# Patient Record
Sex: Male | Born: 1935 | Race: White | Hispanic: No | Marital: Married
Health system: Southern US, Community
[De-identification: ages and names within clinical notes are randomized; demographics above are authoritative.]

## PROBLEM LIST (undated history)

## (undated) DIAGNOSIS — I10 Essential (primary) hypertension: Secondary | ICD-10-CM

## (undated) DIAGNOSIS — I447 Left bundle-branch block, unspecified: Secondary | ICD-10-CM

## (undated) DIAGNOSIS — N189 Chronic kidney disease, unspecified: Secondary | ICD-10-CM

## (undated) DIAGNOSIS — I739 Peripheral vascular disease, unspecified: Secondary | ICD-10-CM

## (undated) DIAGNOSIS — I272 Pulmonary hypertension, unspecified: Secondary | ICD-10-CM

## (undated) DIAGNOSIS — Z952 Presence of prosthetic heart valve: Secondary | ICD-10-CM

## (undated) DIAGNOSIS — I2510 Atherosclerotic heart disease of native coronary artery without angina pectoris: Secondary | ICD-10-CM

## (undated) DIAGNOSIS — Z8673 Personal history of transient ischemic attack (TIA), and cerebral infarction without residual deficits: Secondary | ICD-10-CM

## (undated) DIAGNOSIS — N182 Chronic kidney disease, stage 2 (mild): Secondary | ICD-10-CM

## (undated) HISTORY — PX: CORONARY ANGIOPLASTY: SHX604

## (undated) HISTORY — DX: Peripheral vascular disease, unspecified: I73.9

---

## 1998-10-16 ENCOUNTER — Encounter: Payer: Self-pay | Admitting: Cardiology

## 1998-10-16 ENCOUNTER — Ambulatory Visit (HOSPITAL_COMMUNITY): Admission: RE | Admit: 1998-10-16 | Discharge: 1998-10-16 | Payer: Self-pay | Admitting: Cardiology

## 1999-03-21 ENCOUNTER — Ambulatory Visit (HOSPITAL_COMMUNITY): Admission: RE | Admit: 1999-03-21 | Discharge: 1999-03-21 | Payer: Self-pay | Admitting: *Deleted

## 2000-06-23 ENCOUNTER — Encounter: Admission: RE | Admit: 2000-06-23 | Discharge: 2000-06-23 | Payer: Self-pay | Admitting: Endocrinology

## 2000-06-23 ENCOUNTER — Encounter: Payer: Self-pay | Admitting: Endocrinology

## 2004-05-02 ENCOUNTER — Ambulatory Visit (HOSPITAL_COMMUNITY): Admission: RE | Admit: 2004-05-02 | Discharge: 2004-05-02 | Payer: Self-pay | Admitting: *Deleted

## 2004-05-02 ENCOUNTER — Encounter (INDEPENDENT_AMBULATORY_CARE_PROVIDER_SITE_OTHER): Payer: Self-pay | Admitting: *Deleted

## 2005-10-10 ENCOUNTER — Ambulatory Visit (HOSPITAL_COMMUNITY): Admission: RE | Admit: 2005-10-10 | Discharge: 2005-10-10 | Payer: Self-pay | Admitting: *Deleted

## 2005-10-10 ENCOUNTER — Encounter (INDEPENDENT_AMBULATORY_CARE_PROVIDER_SITE_OTHER): Payer: Self-pay | Admitting: Specialist

## 2006-08-18 HISTORY — PX: OTHER SURGICAL HISTORY: SHX169

## 2007-02-22 ENCOUNTER — Encounter: Admission: RE | Admit: 2007-02-22 | Discharge: 2007-02-22 | Payer: Self-pay | Admitting: Cardiology

## 2007-04-21 ENCOUNTER — Encounter (INDEPENDENT_AMBULATORY_CARE_PROVIDER_SITE_OTHER): Payer: Self-pay | Admitting: Neurology

## 2007-04-21 ENCOUNTER — Inpatient Hospital Stay (HOSPITAL_COMMUNITY): Admission: EM | Admit: 2007-04-21 | Discharge: 2007-04-23 | Payer: Self-pay | Admitting: *Deleted

## 2007-04-22 ENCOUNTER — Encounter (INDEPENDENT_AMBULATORY_CARE_PROVIDER_SITE_OTHER): Payer: Self-pay | Admitting: Neurology

## 2007-04-22 ENCOUNTER — Ambulatory Visit: Payer: Self-pay | Admitting: Surgery

## 2007-04-22 DIAGNOSIS — I639 Cerebral infarction, unspecified: Secondary | ICD-10-CM | POA: Insufficient documentation

## 2007-04-24 ENCOUNTER — Inpatient Hospital Stay (HOSPITAL_COMMUNITY): Admission: EM | Admit: 2007-04-24 | Discharge: 2007-04-27 | Payer: Self-pay | Admitting: Emergency Medicine

## 2007-04-26 ENCOUNTER — Encounter: Payer: Self-pay | Admitting: Surgery

## 2007-05-17 ENCOUNTER — Ambulatory Visit: Payer: Self-pay | Admitting: Surgery

## 2007-06-07 ENCOUNTER — Ambulatory Visit: Payer: Self-pay | Admitting: Surgery

## 2007-08-04 ENCOUNTER — Encounter: Payer: Self-pay | Admitting: Surgery

## 2007-08-04 ENCOUNTER — Ambulatory Visit: Payer: Self-pay | Admitting: Surgery

## 2007-08-04 ENCOUNTER — Inpatient Hospital Stay (HOSPITAL_COMMUNITY): Admission: RE | Admit: 2007-08-04 | Discharge: 2007-08-05 | Payer: Self-pay | Admitting: Surgery

## 2007-08-04 HISTORY — PX: CORONARY ARTERY BYPASS GRAFT: SHX141

## 2007-08-23 ENCOUNTER — Ambulatory Visit: Payer: Self-pay | Admitting: Surgery

## 2008-01-03 ENCOUNTER — Encounter (INDEPENDENT_AMBULATORY_CARE_PROVIDER_SITE_OTHER): Payer: Self-pay | Admitting: *Deleted

## 2008-01-03 ENCOUNTER — Ambulatory Visit (HOSPITAL_COMMUNITY): Admission: RE | Admit: 2008-01-03 | Discharge: 2008-01-03 | Payer: Self-pay | Admitting: *Deleted

## 2008-02-07 ENCOUNTER — Ambulatory Visit: Payer: Self-pay | Admitting: Surgery

## 2008-07-24 ENCOUNTER — Ambulatory Visit: Payer: Self-pay | Admitting: Surgery

## 2008-08-03 ENCOUNTER — Emergency Department (HOSPITAL_COMMUNITY): Admission: EM | Admit: 2008-08-03 | Discharge: 2008-08-03 | Payer: Self-pay | Admitting: Emergency Medicine

## 2009-07-30 ENCOUNTER — Ambulatory Visit: Payer: Self-pay | Admitting: Surgery

## 2010-09-09 ENCOUNTER — Ambulatory Visit
Admission: RE | Admit: 2010-09-09 | Discharge: 2010-09-09 | Payer: Self-pay | Source: Home / Self Care | Attending: Surgery | Admitting: Surgery

## 2010-09-16 NOTE — Procedures (Unsigned)
CAROTID DUPLEX EXAM  INDICATION:  Bilateral carotid endarterectomy followup.  HISTORY: Diabetes:  No. Cardiac:  CABG. Hypertension:  Yes. Smoking:  No. Previous Surgery:  Left carotid endarterectomy, 04/26/2007, right carotid endarterectomy, 07/25/2007. CV History:  Currently asymptomatic. Amaurosis Fugax No, Paresthesias No, Hemiparesis No.                                      RIGHT             LEFT Brachial systolic pressure:         142               145 Brachial Doppler waveforms:         Normal            Normal Vertebral direction of flow:        Antegrade         Antegrade DUPLEX VELOCITIES (cm/sec) CCA peak systolic                   63                82 ECA peak systolic                   93                98 ICA peak systolic                   74                79 ICA end diastolic                   27                25 PLAQUE MORPHOLOGY:                  Intimal wall thickening             Intimal wall thickening PLAQUE AMOUNT:                      Minimal           Minimal PLAQUE LOCATION:                    CCA               CCA  IMPRESSION: 1. Patent bilateral carotid endarterectomy sites with no evidence of     restenosis of bilateral internal carotid arteries. 2. No significant change from previous study.  ___________________________________________ V. Leia Alf, MD  EM/MEDQ  D:  09/09/2010  T:  09/09/2010  Job:  [id number]

## 2010-12-31 NOTE — Op Note (Signed)
NAMEAVRON, [name]                  ACCOUNT NO.:  [id number]   MEDICAL RECORD NO.:  [id number]          PATIENT TYPE:  INP   LOCATION:  2550                         FACILITY:  [facility]   PHYSICIAN:  Theotis Burrow IV, MDDATE OF BIRTH:  18-Jan-1934   DATE OF PROCEDURE:  08/04/2007  DATE OF DISCHARGE:                               OPERATIVE REPORT   ASSISTANT:  Amanda L. Dahlia Client, P.A.   PREOPERATIVE DIAGNOSIS:  Asymptomatic right carotid stenosis.   POSTOPERATIVE DIAGNOSIS:  Asymptomatic right carotid stenosis.   PROCEDURE PERFORMED:  Right carotid endarterectomy.   TYPE OF ANESTHESIA:  General.   DRAINS:  15-Blake.   FINDINGS:  90% stenosis.   SPECIMENS:  Plaque.   BLOOD LOSS:  200 mL.   COMPLICATIONS:  None.   INDICATIONS:  This is a 77 year old gentleman who underwent left carotid  endarterectomy in September after suffering a mild stroke.  At the time  of that workup, he has found to have significant stenosis on the right  side.  Decision has been made to proceed with right carotid  endarterectomy.  His vocal cords were evaluated preoperatively and found  to be functioning appropriately.  Risks and benefits were discussed.  Informed consent was signed.   DESCRIPTION OF PROCEDURE:  The patient was identified in the holding  area and taken to room #6.  He was placed supine on the table.  General  endotracheal anesthesia was administered.  The patient was prepped and  draped in a standard sterile fashion.  A timeout was called and  perioperative antibiotics were administered.  A longitudinal incision  was made along the anterior surface of the sternocleidomastoid.  Bovie  cautery was used to dissect through the subcutaneous tissue.  The  platysma muscle was divided with Bovie cautery.  Weitlaner retractors  were used for exposure.  Bovie cautery was used to dissect down to the  carotid sheath.  At this point, the facial vein was identified and  ligated between 2-0 silk  ties.  There were numerous venous branches off  of the internal jugular vein, which were divided between silk ties.  Next, the carotid sheath was opened sharply and the common carotid  artery was encircled with a vessel loop.  Next, the closure of the  external and superior thyroid arteries were performed.  The external  carotid was encircled with a blue vessel loop and the superior thyroid  artery was encircled with a 2-0 silk tie.  Next, the internal carotid  artery was isolated.  The hypoglossal nerve was visualized and  preserved.  Once there was adequate distance exposed on the internal  carotid artery, the patient was given systemic heparinization.  Next,  the internal carotid artery was occluded followed by the common and  external carotid arteries.  An arteriotomy was made with #11 blade and  the artery was opened with Potts scissors.  A 10-French Silastic shunt  was then placed.  Next, endarterectomy was performed using a Investment banker, corporate.  Heparinized saline was used to irrigate the endarterectomy  plane and all potential embolic debris was  removed.  Patch angioplasty  was performed using a bovine pericardial patch.  This was sutured into  position using a running 6-0 Prolene.  Prior to completion of the patch  angioplasty, the shunt was removed.  The internal, external, and common  carotid arteries were flushed appropriately.  The artery was then  irrigated with heparinized saline and remaining sutures were placed.  The anastomosis was then secured and clamps were released; first the  external followed by the common, and approximately 15 seconds later, the  clamp on the internal carotid artery was released.  Two pair of stitches  were required for hemostasis.  The wound was then copiously irrigated.  Surgicel was used to aid with hemostasis.  Once hemostasis was  excellent, a 15-Blake drain was placed.  The fascia of the  sternocleidomastoid was reapproximated with interrupted  3-0 Vicryl.  Next, the platysma muscle was closed with a running 2-0 Vicryl and the  skin was closed with a running 4-0 Vicryl.  Dermabond was then placed on  the skin.  The sterile dressings were applied.  The drain was secured  with a 3-0 nylon.  The patient was then successfully extubated.  He was  found to be following commands and able to move all four extremities to  command.  He was then taken to the recovery room in stable condition.           ______________________________  V. Leia Alf, MD  Electronically Signed     VWB/MEDQ  D:  08/04/2007  T:  08/04/2007  Job:  [id number]

## 2010-12-31 NOTE — Assessment & Plan Note (Signed)
OFFICE VISIT   CARTEZ, TRITZ D  DOB:  10-12-1933                                       06/07/2007  CHART#:[id number]   REASON FOR VISIT:  1. Follow up left carotid endarterectomy.  2. Evaluate right carotid stenosis.   HISTORY:  This is a 77 year old gentleman who initially presented with  right arm and leg weakness.  He was diagnosed with having a mild acute  stroke.  He underwent a left carotid endarterectomy, performed on  April 26, 2007.  His postoperative course was complicated by mild  facial droop.  He comes back in today for evaluation of the right side.  From his original operation, he is recovering well.  He still has a mild  facial droop and some numbness from around his incision, otherwise he is  doing very well.  He is continuing his aspirin.  With regards to the  right side, he remains asymptomatic.  Specifically, he denies amaurosis,  numbness, or weakness.   REVIEW OF SYSTEMS:  NEURO:  See above.  CARDIAC:  No chest pain.  PULMONARY:  No shortness of breath.  NEUROLOGIC:  Mild left facial droop.  No new right-sided symptoms.   Past medical history, surgical history, and medications are unchanged  from previous visit.   PHYSICAL EXAMINATION:  Pulse is 52.  Left arm blood pressure is 169/88.  Right arm is 168/97.  O2 sats are 100%.  General:  Well-appearing in no  acute distress.  Cardiovascular:  Regular.  Respirations nonlabored.  Neck:  Left carotid endarterectomy incision is well healed.  Neuro:  No  motor or sensory deficits bilaterally.   DIAGNOSTIC STUDIES:  Patient underwent duplex evaluation today, which  reveals resolution of stenosis within the left side.  He has peaked  systolic velocities on the left at 0000000 and diastolic velocities of 87.   ASSESSMENT:  Bilateral carotid stenosis, status post left carotid  endarterectomy .   PLAN:  Even at this time, even his velocity profile puts him in the 60-  79% stenosis.  He is  right on the border.  Given that he has previously  had a stroke and incidentally, he presents stenosis on the left side,  the symptomatic side was 60-79%, I have recommended proceeding with  right carotid endarterectomy.  We had a lengthy discussion regarding  risks and benefits.  At this time, the patient wishes to proceed with an  operation; however, he will call to have this scheduled.  On the  meantime, he will continue on his aspirin.  Patient needs to be  evaluated by ENT to evaluate his cords prior to surgery.  This will be  scheduled.   Eldridge Abrahams, MD  Electronically Signed   VWB/MEDQ  D:  06/07/2007  T:  06/08/2007  Job:  [id number]

## 2010-12-31 NOTE — H&P (Signed)
[name], [name]:  [id number]   MEDICAL RECORD NO.:  [id number]          PATIENT TYPE:  INP   LOCATION:  3037                         FACILITY:  [facility]   PHYSICIAN:  Flint Melter. [name], M.D.DATE OF BIRTH:  1934/02/22   DATE OF ADMISSION:  04/21/2007  DATE OF DISCHARGE:                              HISTORY & PHYSICAL   CHIEF COMPLAINT:  Right arm weakness, sudden onset.   HISTORY OF PRESENT ILLNESS:  [name] is a 77 year old right-handed  white man with no significant past medical history except for  hypertension and coronary artery disease.  He has generally been  healthy.  He was shaving today and noticed a sudden onset of right arm  weakness, the arm just dropped to his side.  He and his wife came in  quickly and he was seen within an hour or less of the time of the onset  of his symptoms.  He began to have some improvement of the weakness  after arrival here.  He denied any dizziness or incoordination, or  nausea, vomiting.  No headache.   REVIEW OF SYSTEMS:  A 12-system review is negative except for some  flatulence related to his medication and some mild chronic leg weakness  secondary to his cholesterol medications.  The remaining review of  systems is negative.   PAST MEDICAL HISTORY:  Significant for:  1. Coronary artery disease without a MI.  Status post stent which      failed.  Then status post CABG.  2. He has hypertension.  3. Hyperlipidemia.   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   MEDICATIONS:  1. He takes Benicar 20 mg once a day.  2. Crestor 10 mg once a day.  3. Zetia 10 mg once a day.  4. Niaspan 500 mg once a day.  5. Metoprolol 25 mg twice a day.  6. Omega fish oil 1000 mg three times a day.  7. Aspirin 325 mg half tablet a day.   ALLERGIES:  NO KNOWN DRUG ALLERGIES.   SOCIAL HISTORY:  He is married.  He has 2 children.  He  is a retired  Administrator, sports.  He does not smoke, drink, or use illicit drugs.   FAMILY HISTORY:  Positive for stroke in an aunt.  Also hypertension.   PHYSICAL EXAMINATION:  VITAL SIGNS:  Temperature is not taken.  Blood  pressure is 161/79, heart rate 67, respirations 22, 100% sat.  HEAD:  Normocephalic, atraumatic.  NECK:  Supple without bruits.  HEART:  Regular rate and rhythm.  LUNGS:  Clear to auscultation.  ABDOMEN:  Nontender, soft.  SKIN:  Dry.  NEUROLOGIC EXAM:  He is alert and oriented with normal language and  cognition.  Cranial nerves, pupils are equal and reactive.  Visual  fields are full.  Extraocular movements are intact.  Facial sensation is  normal.  Facial motor activity is normal.  Hearing is intact.  Palette  is symmetric.  Tongue is  midline.  MOTOR EXAM:  He has got no drift in the upper extremities or lower  extremities.  Normal strength.  Deep tendon reflexes are 1 to 2+ with  downgoing toes.  COORDINATION:  He has a distinct dysmetria on finger-to-nose on the  right.  It is normal on the left.  Heel-to-shin is normal bilaterally.  Sensory is normal.  He scores 1 on the NIH stroke scale for the  dysmetria on the finger-to-nose.  Modified Rankin is 0.   TEST RESULTS:  BMET is pending.  CBC is essentially normal.  CT of the  head shows some subacute to chronic anterior right frontal  hypodensities.  PT is 13.6, INR 1, PTT 30.   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   PLAN:  Will admit for stroke workup.  Increase aspirin.  Check MRI of  the brain.  MR angiogram, 2D echo, fasting lipids, and homocystine.  Carotid Doppler and transcranial Doppler.  He is not a TPA candidate  secondary to a score of 1 on the NIH scale with a single element of  finger-to-nose ataxia.      [name] [name], M.D.  Electronically Signed     CAW/MEDQ  D:  04/21/2007  T:  04/22/2007  Job:  [id number]

## 2010-12-31 NOTE — Assessment & Plan Note (Signed)
OFFICE VISIT   [name]  DOB:  10/01/1933                                       08/23/2007  CHART#:[id number]   REASON FOR VISIT:  Status post carotid endarterectomy.   HISTORY:  This is a 77 year old gentleman who initially presented with  right arm and leg weakness and was diagnosed as having mild acute  stroke.  On September 8, he underwent a left carotid endarterectomy.  He  is recovered from that without incident.  He was also found during his  stroke workup to have an 80% stenosis in his right carotid.  For that  reason, he recently underwent a right carotid endarterectomy performed  in early December.  He comes back in today for followup.  The patient  reports that he is not having any difficulties.  He has had no  complications in his postoperative course.  He is doing well.  He has no  neurologic deficits.   PHYSICAL EXAM:  Blood pressure 192/104, pulse 64, respirations 18.  In  general, he is alert and oriented, in no acute distress.  His strength  is symmetric bilaterally.  Tongue is midline.  There is no facial droop.   ASSESSMENT AND PLAN:  Status post bilateral carotid endarterectomy.   PLAN:  The patient is doing well at this time.  I have recommended that  he continue his aspirin.  His blood pressure is slightly elevated today.  He states that he first may have adjusted his Benicar to a lower dose,  and he will increase it now given his increase in blood pressure today.  I have him scheduled to follow up with me in 6 months, at which time we  will repeat his  ultrasound.  He is going to be placed on the ultrasound protocol.  I  will see him back in 6 months.   Steven Abrahams, MD  Electronically Signed   VWB/MEDQ  D:  08/23/2007  T:  08/24/2007  Job:  [id number]   cc:   Ronaldo Miyamoto, M.D.

## 2010-12-31 NOTE — Discharge Summary (Signed)
[name], [name]                  ACCOUNT NO.:  [id number]   MEDICAL RECORD NO.:  [id number]          PATIENT TYPE:  INP   LOCATION:  [id number]                         FACILITY:  [facility]   PHYSICIAN:  Theotis Burrow IV, MDDATE OF BIRTH:  May 23, 1934   DATE OF ADMISSION:  08/04/2007  DATE OF DISCHARGE:  08/05/2007                               DISCHARGE SUMMARY   ADMISSION DIAGNOSIS:  Right internal carotid stenosis.   SECONDARY AND DISCHARGE DIAGNOSES:  1. Right internal carotid stenosis.  2. Gastrointestinal bleed in the 1970s.  3. Hypertension.  4. Coronary artery bypass grafting.  5. Percutaneous transluminal coronary angioplasty and stenting.  6. History of left bundle blockage.  7. History of stroke.  8. Hiatal hernia.   CONSULTATIONS:  None.   PROCEDURES:  1. Right carotid endarterectomy with bovine pericardial patch.  2. Insertion of JP drain.   BRIEF HISTORY:  This is a 77 year old gentleman who initially presented  with right arm and leg weakness.  He was diagnosed with having a mild  acute stroke.  He underwent a left carotid endarterectomy performed on  April 26, 2007.  His postoperative course was complicated by mild  facial droop.  He comes back in today on June 07, 2007 for evaluation  of the right side.  From his original operation, he was recovering well.  He still had a mild facial droop and some numbness around his incision;  otherwise, he was doing very well.  He had been continuing his aspirin.  With regards to the right side, he remained asymptomatic.  Specifically,  he denied any amaurosis, numbness, or weakness.  The patient underwent  duplex evaluation on June 07, 2007 which revealed resolution of  stenosis within the left side.  He has peak systolic velocities on the  left at 0000000, diastolic velocities of 87.  Assessing the situation,  bilateral carotid stenosis status post left carotid endarterectomy.  The  plan was even at this time, his  velocity profile puts him in the 60-79%  stenosis.  Being on the border and given his previous history of stroke,  he presents on the left side with stenosis that is symptomatic, and we  recommended proceeding with right carotid endarterectomy.  Had a lengthy  discussion regarding the risks and benefits.  At this time, the patient  wished to proceed with an operation; however, he wanted to call and  schedule it for August 04, 2007.  In the mean time, upon waiting on  surgery, the patient would continue his aspirin.  The patient also  needed to be evaluated by ENT to evaluate his cords prior to surgery.  This was scheduled.  On August 04, 2007, the patient was admitted to  [facility] to proceed with right carotid endarterectomy by Dr.  Trula Slade.  The patient signed consent and agreed to this procedure with  no complications.   HOSPITAL COURSE:  On August 04, 2007, the patient was admitted to  [facility] for procedure with Dr. Trula Slade of right carotid  endarterectomy with bovine pericardial patch and insertion of JP  drain.  The patient tolerated this procedure well with no complications and was  transferred from the OR to the PACU in stable condition.  The patient  continued to remain overnight in PACU due to the fact that there were no  beds available on 3300.  The patient continued to remain stable and  afebrile throughout the night.  Upon examination on August 05, 2007,  postoperative day #1, the patient continued to remain stable at this  time with no complications.  His blood pressure was 144/53, heart rate  66, respirations 15, oxygen 92-94%, temperature 97.6.  Labs at this time  revealed a white blood count of 6.5, hematocrit 10.9, hemoglobin 31.2,  and platelets at 121.  His sodium was 139, potassium 4.5, chloride 111,  bicarb 24, BUN 17, creatinine 1.15, and glucose 125.  Upon entering the  room, the patient states he had no complaints.  He ate last night   without any difficulty of swallowing and no nausea or vomiting.  The  patient states he slept okay but had mild pain throughout the night.  The patient stated that he was ready to go home today.   PHYSICAL EXAMINATION:  GENERAL:  Alert and oriented x3, lying in bed  waiting for breakfast.  HEART:  Normal rate, regular rhythm.  LUNGS:  Clear bilaterally.  ABDOMEN:  Soft, nontender, with active bowel sounds.  NECK:  His right incision on his neck, he had no hematoma.  There was  minimal drainage in the JP drain, and it seemed like it was proceeding  towards healing and dry and clean.  NEUROLOGIC:  The patient was intact with no deviation of tongue, no  decrease in weakness or strength.   ASSESSMENT AND PLAN:  The patient is status post right carotid  endarterectomy with no complications.  The plan is for the patient to  ambulate this morning if he can tolerate ambulation and eating breakfast  without any nausea and vomiting, then we will discontinue Foley and JP  drain and IV fluids and the patient will be discharged to home.  The  patient agrees with this plan.  Discharge was indicated for August 05, 2007 if the patient continues to remain afebrile and vitals remain  stable.   DISCHARGE MEDICATIONS:  1. __________  10 mg daily.  2. Crestor 10 mg daily.  3. Metoprolol 25 mg x2 daily.  4. Aspirin 81 mg daily.  It was currently at 325, and Dr. Trula Slade      wished to reduce it to 81 mg daily.  5. __________  1000 mg daily.  6. Nexium 40 mg daily.  7. Diovan 20 mg daily.  8. Tylox 1-2 tablets as needed for pain.   DISCHARGE INSTRUCTIONS:  The patient's discharge instructions and  followup care was instructed to patient for discharge on August 05, 2007.  The patient was instructed to clean wound with soap and water.  The patient was also instructed to increase activity slowly and walk  with assistance.  The patient was also discussed that he may shower and  bathe postoperative  48 hours but to do no lifting or driving for the  next 2 weeks.  The patient is also to contact the office or ER if he  notices any redness, swelling, or drainage from site, fever greater than  101, severe onset of a headache, or any neurological status changes.  The patient was also instructed that he needs to follow up with Dr.  Trula Slade  in 2 weeks, and the office will contact him.  The patient was  also instructed to continue home medications with an additional  medication added which was Tylox 1-2 tablets every 6 hours as needed for  pain orally.  The patient was also instructed to decrease his aspirin  from 325 mg to 81 mg daily by mouth.  The patient  agreed to understanding these instructions and agreed to be compliant  with these instructions and followup care.  As long as the patient  continues to tolerate diet this morning, ambulates, remains afebrile and  vitals remain currently stable, then the patient will discharge to home  on August 05, 2007.  The patient agrees to this plan.      Darlin Coco, PA      V. Leia Alf, MD  Electronically Signed    ALW/MEDQ  D:  08/05/2007  T:  08/05/2007  Job:  [id number]

## 2010-12-31 NOTE — H&P (Signed)
NAMECOURTLAND, LOTZ                  ACCOUNT NO.:  [id number]   MEDICAL RECORD NO.:  [id number]          PATIENT TYPE:  INP   LOCATION:  3707                         FACILITY:  [facility]   PHYSICIAN:  Shaune Pascal. Champey, M.D.DATE OF BIRTH:  [birth year]   DATE OF ADMISSION:  04/24/2007  DATE OF DISCHARGE:                              HISTORY & PHYSICAL   REQUESTING PHYSICIAN:  Dr. Dorna Mai   REASON FOR ADMISSION:  Transient ischemic attack.   HISTORY OF PRESENT ILLNESS:  Mr. Kimball is a 77 year old Caucasian male  with multiple medical problems who was recently discharged from the  hospital yesterday after an embolic left hemispheric stroke after a few-  second episode of right-sided numbness/weakness that quickly resolved.  Patient's workup in the hospital last week showed left ICA stenosis of  60 to 80%, right ICA stenosis of greater than 80%.  Surgery was planned  for the near future.  He denies any other symptoms such as headaches,  vision changes, speech changes, swallowing problems, chewing problems,  dizziness, vertigo, balance problems, falls or loss of consciousness.   PAST MEDICAL HISTORY:  Positive for:  1. Hypertension.  2. Heart disease status post CABG.  3. Elevated lipids.  4. Stroke.   CURRENT MEDICATIONS:  Include aspirin, Plavix, nystatin, Zetia,  metoprolol, Crestor, fish oil, Benicar, Nexium.   ALLERGIES:  No known drug allergies.   FAMILY HISTORY:  Positive for stroke and hypertension.   SOCIAL HISTORY:  Patient is a retired , lives with his wife and kids.  Denies any tobacco, alcohol or drug use.   Review of systems is positive as per HPI.  Review of systems is negative  as per HPI in greater than eight other organ systems.   EXAMINATION:  VITALS:  Blood pressure is 128/77, temperature is 99.2,  pulse is 69, respiration is 17, O2 sat is 100%.  HEENT:  Normocephalic, atraumatic.  Extraocular muscles are intact.  Face is symmetric.  NECK:  Supple.  HEART:   Regular.  LUNGS:  Clear.  ABDOMEN:  Soft.  EXTREMITIES:  Showed good pulses.  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  NEUROLOGICAL EXAMINATION:  Patient is awake, alert and oriented.  Language is fluent.  Patient is following commands approximately.  Cranial nerves II-XII grossly intact.  Motor examination shows 5/5  strength and normal tone in all four extremities, no drift is noted.  Sensory examination is within normal limits to light touch.  Reflexes  are 1 to 2+ throughout and symmetric.  Cerebellar function is within  normal limits.  Finger-to-nose and gait are not assessed secondary to  safety.   LABS:  On September 5, WBC is 5.3, hemoglobin 12.8 and hematocrit is  36.8, platelets 152, homocysteine level is 17.0, hemoglobin A1c is 5.9,  cholesterol is 126, LDL is 44.   MRI of the brain done on April 22, 2007 showed left frontoparietal  infarcts.   Carotid Doppler showed right ICA stenosis of 80 to 99% and left ICA  stenosis of 60 to 80%.   IMPRESSION:  This is a 77 year old with transient ischemic  attack and  bilateral carotid stenosis.  Patient is not a candidate for IV/TPA, as  his symptoms are resolved.  Will admit the patient and will start IV  heparin given his severe stenosis and transient ischemic attack and  recent stroke.  We will consult vascular surgery who was consulted on  last clinic visit.  Will hold his aspirin and Plavix.  We will place him  on IV heparin and continue his other home medications.  For his elevated  homocysteine level, will start Metanx one tablet p.o. once a day.  Will  check an MRI of the brain to look for any stroke.  Will follow the  patient while he is in the hospital.      Shaune Pascal. Estella Husk, M.D.  Electronically Signed     DRC/MEDQ  D:  04/24/2007  T:  04/25/2007  Job:  [id number]

## 2010-12-31 NOTE — Procedures (Signed)
CAROTID DUPLEX EXAM   INDICATION:  Follow-up evaluation of known carotid artery disease.   HISTORY:  Diabetes:  No.  Cardiac:  Coronary artery bypass graft in 1997.  Hypertension:  Yes.  Smoking:  No.  Previous Surgery:  Left carotid endarterectomy on 04/26/2007.  Right  carotid endarterectomy on 08/04/2007 with bovine pericardium patch by  Dr. Trula Slade IV.  CV History:  Patient reports no cerebrovascular symptoms at this time;  however, had a TIA prior to endarterectomies.  Previous duplex on  02/07/2008 revealed no ICA stenosis, status post endarterectomy  bilaterally.  Amaurosis Fugax No, Paresthesias No, Hemiparesis No.                                       RIGHT             LEFT  Brachial systolic pressure:         188               188  Brachial Doppler waveforms:         Triphasic         Triphasic  Vertebral direction of flow:        Antegrade         Antegrade  DUPLEX VELOCITIES (cm/sec)  CCA peak systolic                   82                82  ECA peak systolic                   78                79  ICA peak systolic                   81                88  ICA end diastolic                   23                24  PLAQUE MORPHOLOGY:                  None              Soft  PLAQUE AMOUNT:                      None              Mild  PLAQUE LOCATION:                    None              Proximal ICA   IMPRESSION:  1. No internal carotid artery stenosis bilaterally, status post      endarterectomy.  2. No significant change from previous study.   ___________________________________________  V. Leia Alf, MD   MC/MEDQ  D:  07/24/2008  T:  07/25/2008  Job:  [id number]

## 2010-12-31 NOTE — Procedures (Signed)
CAROTID DUPLEX EXAM   INDICATION:  Followup known coronary artery disease.  Status post left  carotid endarterectomy.   HISTORY:  Diabetes:  No  Cardiac:  CABG in 1997  Hypertension:  Yes  Smoking:  No  Previous Surgery:  Left carotid endarterectomy in 2008  CV History:  No  Amaurosis Fugax:  No, Paresthesias:  No,  Hemiparesis:  No                                       RIGHT             LEFT  Brachial systolic pressure:         180               180  Brachial Doppler waveforms:         Biphasic          Biphasic  Vertebral direction of flow:        Antegrade         Antegrade  DUPLEX VELOCITIES (cm/sec)  CCA peak systolic                   50                63  ECA peak systolic                   48                32  ICA peak systolic                   249               68  ICA end diastolic                   87                22  PLAQUE MORPHOLOGY:                  Calcified         None  PLAQUE AMOUNT:                      Moderate to severe                  None  PLAQUE LOCATION:                    ICA and ECA       None   IMPRESSION:  60% to 79% stenosis noted in the right internal carotid  artery.  Normal carotid duplex noted in the left internal carotid  artery.  Status post left carotid endarterectomy.  Antegrade bilateral  vertebral arteries.   ___________________________________________  V. Leia Alf, MD   MG/MEDQ  D:  06/07/2007  T:  06/08/2007  Job:  [id number]

## 2010-12-31 NOTE — Discharge Summary (Signed)
NAMEADOLPHO, URSIN                  ACCOUNT NO.:  [id number]   MEDICAL RECORD NO.:  [id number]          PATIENT TYPE:  INP   LOCATION:  [id number]                         FACILITY:  [facility]   PHYSICIAN:  Theotis Burrow IV, MDDATE OF BIRTH:  02/13/1934   DATE OF ADMISSION:  04/24/2007  DATE OF DISCHARGE:  04/27/2007                               DISCHARGE SUMMARY   SUMMARY OF HOSPITAL PROCEDURES:  Left carotid endarterectomy on  April 26, 2007.   SUMMARY OF HOSPITAL COURSE:  This is a 77 year old gentleman who has  bilateral carotid stenosis who was discharged home last week but  returned over the weekend with numbness in his right arm and leg as well  as some facial numbness.  For that reason he was admitted to neurology  and placed on heparin drip.  On September 8 he underwent left carotid  endarterectomy.  The patient tolerated this procedure well.  He had no  neurologic deficits after his procedure.  He has a mild left lid droop.  He is doing well.  His drain has been removed and he is ambulating and  eating without difficulty.  He has no pain issues.  He will be  discharged to home.  The plan will be for the patient to followup in my  clinic in 2 weeks.   DISCHARGE MEDICATIONS:  1. Will include his home medicines.  2. He will be on Niaspan 500 mg q.h.s.  3. Zetia 10 mg once a day.  4. Metoprolol 25 mg twice a day.  5. Crestor 10 mg daily.  6. Fish oil 1000 mg t.i.d.  7. Benicar 20 mg daily.  8. Nexium 40 mg daily.  9. Aspirin 325 mg daily.  10.The patient will no longer take Plavix.   DISCHARGE INSTRUCTIONS:  Should he have drainage from his wound, he  should just place a dry gauze.  He should not drive.  If he has any  questions, fevers or chills, he should call the office.      Eldridge Abrahams, MD  Electronically Signed     VWB/MEDQ  D:  04/27/2007  T:  04/27/2007  Job:  [id number]

## 2010-12-31 NOTE — Assessment & Plan Note (Signed)
OFFICE VISIT   CHAPEL, ZELLAR  DOB:  06-23-1934                                       05/17/2007  CHART#:[id number]   REASON FOR VISIT:  Followup.   HISTORY:  This is a 77 year old gentleman who is status post left  carotid endarterectomy, performed on April 26, 2007.  The patient was  operated on for symptoms of a stroke, which consisted of right arm  weakness, which resolved.  He also had some right-sided facial droop.  His ultrasound revealed bilateral stenosis.  The right side was 80-99%.  The left side was 60-79%.  Again, he underwent a left carotid  endarterectomy.   The patient has recovered well.  He has no residual motor or sensory  deficits in the right upper or lower extremity.  He still does have a  right-sided facial droop.  His incision is well healed.   This is a 77 year old gentleman, status post left carotid endarterectomy  in the setting of bilateral carotid stenosis and a mild stroke with  minimal deficits.   PLAN:  At this point, the patient is recovering well.  I intend to keep  him on a full-strength aspirin.  He will follow up in my office in three  weeks.  At that time, we will repeat his duplex  and re-evaluate the stenosis on the right side.  Should he require  carotid endarterectomy on the right, he will need to be evaluated by ENT  to look at his vocal cords prior to his procedure.   Eldridge Abrahams, MD  Electronically Signed   VWB/MEDQ  D:  05/17/2007  T:  05/18/2007  Job:  [id number]   cc:   Ronaldo Miyamoto, M.D.

## 2010-12-31 NOTE — Discharge Summary (Signed)
NAMEINDALECIO, VAJDA                  ACCOUNT NO.:  [id number]   MEDICAL RECORD NO.:  [id number]          PATIENT TYPE:  INP   LOCATION:  3037                         FACILITY:  [facility]   PHYSICIAN:  Pramod P. Leonie Man, MD    DATE OF BIRTH:  [birth year]   DATE OF ADMISSION:  04/21/2007  DATE OF DISCHARGE:                               DISCHARGE SUMMARY   DIAGNOSES AT TIME OF DISCHARGE:  1. Left frontoparietal infarct with right upper extremity ataxia      secondary to artery-to-artery emboli from left internal carotid      artery stenosis.  2. Left internal carotid artery stenosis 60-80%.  3. Right internal carotid artery stenosis greater than 80%.  4. History of coronary artery disease status post coronary artery      bypass grafting.  5. Hyperlipidemia.  6. Hypertension.  7. Coronary artery bypass grafting in 1997.  8. 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   MEDICINES AT TIME OF DISCHARGE:  1. Niaspan 500 mg a day.  2. Zetia 10 mg a day.  3. Metoprolol 25 mg b.i.d.  4. Crestor 10 mg a day.  5. Fish oil 1000 mg t.i.d.  6. Benicar 20 mg a day.  7. Nexium 40 mg a day.  8. Aspirin 325 mg a day.  9. Plavix 75 mg a day.   STUDIES PERFORMED:  1. CT of the brain on admission shows chronic microvascular ischemic      changes.  No acute abnormality.  2. MRI of the brain shows small to slightly-moderately-sized acute      nonhemorrhagic infarct left frontoparietal lobe.  Mild atrophy and      small-vessel disease.  Scattered small calcifications.  3. MRA of the head shows branch vessel atherosclerotic-type changes.  4. MRA of the neck shows narrowing proximal aspect of internal carotid      arteries bilaterally, right greater than left.  On the right, if      measurements are correct, there is a 55% stenosis.  5. EKG shows normal sinus rhythm with sinus arrhythmia, left atrial      enlargement, left bundle-branch block.  6. Carotid Doppler shows right greater than 80% stenosis,  left 60-80%,      high end of the range ICA stenosis, bilateral vertebrals antegrade.  7. A 2-D echocardiogram performed, results are pending.   LABORATORY STUDIES:  Hemoglobin 12.8, hematocrit 36.8, otherwise normal.  Chemistry with potassium 5.3, BUN 29, creatinine 1.65, glucose 103,  otherwise normal.  Coagulations normal.  Liver function tests normal.  Cardiac enzymes with CK 251, otherwise normal.  Cholesterol 126,  triglycerides 155, HDL 51 and LDL 44.  Urinalysis negative.  Homocystine  is high at 17.  Urine drug screen is negative.  Alcohol level less than  5.  Hemoglobin A1c 5.9.   HISTORY OF PRESENT ILLNESS:  Mr. Jayant Berteau is a 77 year old right-  handed Caucasian male with no significant past medical history except  for hypertension and coronary artery disease.  He was shaving the  morning of admission and noticed a sudden onset  of right arm weakness,  then his arm just dropped to his side.  He and his wife came in quickly  and were seen within an hour or less of the time of symptom onset.  He  began to have some improvement of the weakness after arrival.  He was  not a tPA candidate secondary to quick improvement and an NIH stroke  scale of 1.  He was admitted to the hospital for further evaluation.   HOSPITAL COURSE:  It was initially thought the patient likely had a  right cerebellar infarct, though MRI revealed a left watershed infarct  in the frontoparietal region, found to be secondary to left ICA stenosis  of 60-80% on the high end of the scale.  Stroke was likely artery-to-  artery embolus.  The patient also has significant ICA stenosis on the  right.  Both lesions likely need attention, though symptomatic one takes  priority.  CVTS was called and plans are for surgery in the near future.  In the meanwhile, the patient was placed on IV heparin drip in the  hospital but will be discharged on aspirin and Plavix for stroke  prevention.  The patient also has risk  factors of hypertension and  dyslipidemia for which he already receives treatment.   CONDITION AT DISCHARGE:  The patient alert and oriented x3.  No aphasia  or dysarthria.  No drift.  He has decreased fine motor movement on the  right with minimal right finger-nose-finger dysmetria.  He has no  sensory loss and gait is steady.   DISCHARGE PLAN:  1. Discharge home with family.  2. Aspirin and Plavix for stroke prevention.  3. Follow up with CVTS in 1 week, with planned surgery in 1-2 weeks.  4. Follow up with Dr. Antony Contras in 2-3 months.      Burnetta Sabin, N.P.    ______________________________  Kathie Rhodes. Leonie Man, MD    SB/MEDQ  D:  04/23/2007  T:  04/23/2007  Job:  [id number]   cc:   Eldridge Abrahams, MD

## 2010-12-31 NOTE — Consult Note (Signed)
[name], [name]                  ACCOUNT NO.:  [id number]   MEDICAL RECORD NO.:  [id number]          PATIENT TYPE:  INP   LOCATION:  3037                         FACILITY:  [facility]   PHYSICIAN:  Theotis Burrow IV, MDDATE OF BIRTH:  June 29, 1934   DATE OF CONSULTATION:  04/23/2007  DATE OF DISCHARGE:  04/23/2007                                 CONSULTATION   CONSULTING PHYSICIAN:  Dr. Leonie Man.   REASON FOR CONSULTATION:  Carotid stenosis.   HISTORY:  This is a 77 year old right-handed white gentleman who at 6:30  p.m. on September 3, began having right arm weakness.  This became  progressively worse and he came to the hospital.  He underwent a full  stroke work up and was found to have left frontal parietal infarct with  right upper extremity ataxia secondary to embolic phenomenon, likely  from his left internal carotid.  The patient underwent Duplex evaluation  which revealed 60-80% stenosis in the left carotid and a greater than  80% on the right.  We are consulted for evaluation for carotid  endarterectomy.  At this point in time the patient is improving  significantly.  He had no aphagia or dysarthria.  He now only has  decreased fine motor movement on the right with minimal right  finger/nose/finger dyssymmetry.   PAST MEDICAL HISTORY:  1. Significant for coronary artery disease, status post stent which      failed followed by CABG.  2. Hypertension.  3. Hypercholesterolemia.   PAST SURGICAL HISTORY:  1. CABG.  2. Finger amputation on left hand.   MEDICATIONS:  1. Benicar 20 mg once a day.  2. Crestor 10 mg daily.  3. Zetia 10 mg daily.  4. Niaspan 500 mg once a day.  5. Metoprolol 25 mg twice daily.  6. Omega fish oil 1000 mg 3 times a day.  7. Aspirin 325 mg once a day.   ALLERGIES:  NO KNOWN DRUG ALLERGIES.   SOCIAL HISTORY:  No alcohol.  No tobacco.   PHYSICAL EXAMINATION:  VITAL SIGNS:  The patient has been afebrile.  His  temperature is 98, pulse is 53,  blood pressure is 145/79.  GENERAL:  He is well appearing in no acute distress.  HEENT:  Normocephalic, atraumatic.  CARDIOVASCULAR:  Regular rate and rhythm.  ABDOMEN:  Soft and nontender.  EXTREMITIES:  Warm and well perfused.  The patient has slight sensation  deficit in the tips of his right finger.  He has good strength; however,  his point to point discrimination is slightly altered.  He has normal  motor and sensory function on the left.  He has normal bilateral lower  extremity motor function.   DIAGNOSTIC STUDIES:  Duplex evaluation of the right reveals 80-99%.  On  the left revealed 60-79%.  MRI/MRA:  This reveals small to slightly  moderate sized acute non-hemorrhagic left frontal parietal lobe infarct.   ASSESSMENT/PLAN:  This is a 77 year old gentleman who is status post  mild stroke with minimal residual deficits in the setting of bilateral  carotid stenosis by Duplex criteria right greater  than left.   At this point in time I feel it is beneficial for the patient to proceed  with carotid endarterectomy in the near future, given that he has had a  mild stroke with nearly resolved symptoms.  His stroke was on the right  and therefore I would proceed with left carotid endarterectomy.  He is  currently being maintained on aspirin and Plavix and I am okay with  operating on him with that.  We would likely perform surgery within the  next 1 to 2 weeks.  It sounds as if the patient is being discharged to  home today and therefore, I would have him followup in my clinic on  Monday.  I did have a detailed conversation with the patient regarding  risks of the operation which include the risk of stroke, the risk of  nerve damage, bleeding and nerve injury.  He understands these risks and  is eager to proceed with an operation.  He also understands that we will  also need to followup his right side and he may end up needing bilateral  carotid repair.  I discussed this with Dr.  Candiss Norse and we are in agreement  to pursue a carotid endarterectomy in the near future.      Eldridge Abrahams, MD  Electronically Signed     VWB/MEDQ  D:  04/23/2007  T:  04/24/2007  Job:  [id number] [id number]

## 2010-12-31 NOTE — Op Note (Signed)
[name]                  ACCOUNT NO.:  [id number]   MEDICAL RECORD NO.:  [id number]          PATIENT TYPE:  INP   LOCATION:  2550                         FACILITY:  [facility]   PHYSICIAN:  Theotis Burrow IV, MDDATE OF BIRTH:  November 05, 1933   DATE OF PROCEDURE:  DATE OF DISCHARGE:                               OPERATIVE REPORT   SURGEON:  Annamarie Major, M.D.   Steven MustacheDeitra Mayo, M.D.   PREOPERATIVE DIAGNOSIS:  Symptomatic left carotid stenosis.   POSTOPERATIVE DIAGNOSIS:  Symptomatic left carotid stenosis.   PROCEDURE PERFORMED:  Left carotid endarterectomy.   TYPE OF ANESTHESIA:  General.   BLOOD LOSS:  100 mL.   FINDINGS:  Hemorrhagic plaque, approximately 70% stenosis.   INDICATIONS FOR PROCEDURE:  [name] is a 77 year old gentleman who  presented last week to the hospital with a mild stroke.  He had resolved  his deficit and went home several days; however, over the weekend was  readmitted with right arm and leg symptoms.  He has a duplex exam which  reveals 80 to 100% of stenosis on the right and 60% to 80% on the left.  Given that the left side is his symptomatic side, we have elected to  proceed today.  Prior to the procedure he had resolved his symptoms with  minimal deficit in his right side.  The risks and benefits of the  procedure were discussed with the patient.  Informed consent was signed.  He was willing to proceed.   PROCEDURE:  The patient was identified in the holding area and taken to  room 6.  He was placed supine on the table.  General endotracheal  anesthesia was administered.  The patient was prepped and draped in the  standard sterile fashion.  A time out was called and perioperative  antibiotics were administered.  An incision was made over the anterior  border of the sternocleidomastoid.  Bovie cautery was used to dissect  the subcutaneous tissue.  The platysma muscle was divided with Bovie  cautery.  The common facial vein  was identified and divided between 2-0  silk ties.  The common carotid artery was isolated and a vessel loop was  placed.  Next, dissection was carried along the external carotid such  that the external and superior thyroid artery were each individually  isolated.  The external carotid with the vessel loop and the superior  thyroid with a silk tie.  At this point in time the internal carotid was  dissected out.  The hypoglossal nerve was identified and preserved.  Once adequate distance on the internal had been obtained, an umbilical  tape was passed.   At this point in time, the patient was systemically heparinized.  After  waiting 2 minutes a Gregory clamp was placed on the internal carotid, a  peripheral DeBakey was placed on the common carotid and the vessel loops  were tightened on the external and superior thyroid arteries.  A number  11-blade was used to make an incision in the carotid.  This was opened  with Potts scissors.  Once  I had gotten into the distal internal past  the disease, a straight argyle shunt was placed.  Next, a Investment banker, corporate was used to perform endarterectomy.  An eversion endarterectomy  was performed of the external carotid artery.  All potential embolic  debris was removed.  The artery was then flushed and irrigated with  heparinized saline.  There were no intimal flaps.  Next, a bovine  pericardial patch was selected for closure.  The patch was cut to its  appropriate dimensions and was anastomosed to the common carotid and  internal carotid artery using a running 6-0 Prolene.  Prior to  completion of the anastomosis, the shunt was removed.  The internal,  external and common carotid arteries were appropriately flushed.  The  artery was then filled with heparinized saline and the remaining sutures  were placed.  Two repair stitches were required.  Once the repair was  completed the external carotid clamp was removed, followed by the common  carotid and  then 15 seconds later the internal carotid was opened.  There was an audible Doppler signal in the distal internal carotid  artery.   Hemostasis was achieved.  The fascia of the sternocleidomastoid was  reapproximated with interrupted 3-0 Vicryl.  The platysma muscle closed  with interrupted 3-0 Vicryl.  A drain was placed.  The skin was closed  with a running 4-0 Vicryl and Dermabond was then applied.  The drain was  secured into position with the 3-0 nylon.   After extubation the patient was found to be moving all four  extremities.  He was able to stick his tongue out and move side to side.  He was taken to the recovery room in stable condition.      Steven Abrahams, MD  Electronically Signed     VWB/MEDQ  D:  04/26/2007  T:  04/26/2007  Job:  [id number]

## 2010-12-31 NOTE — Op Note (Signed)
NAMENUMA, BACHMANN NO.:  [id number]   MEDICAL RECORD NO.:  [id number]          PATIENT TYPE:  AMB   LOCATION:  ENDO                         FACILITY:  [facility]   PHYSICIAN:  Waverly Ferrari, M.D.    DATE OF BIRTH:  11/03/1933   DATE OF PROCEDURE:  DATE OF DISCHARGE:                               OPERATIVE REPORT   PROCEDURE:  Upper endoscopy.   INDICATIONS:  Barrett's with Barrett's esophagus.   ANESTHESIA:  Fentanyl 50 mcg, Versed 8 mg.   DESCRIPTION OF PROCEDURE:  With the patient mildly sedated in the left  lateral decubitus position, the Pentax videoscopic endoscope was  inserted into the mouth and passed into the esophagus, then under direct  vision passed distally.  The esophagus appeared normal until we reached  the distal esophagus and there were changes of Barrett's, photographed  and biopsied.  We entered into the stomach.  The fundus, body, antrum,  duodenal bulb and second portion of the duodenum were visualized.  From  this point, the endoscope was slowly withdrawn taking circumferential  views of the duodenal mucosa until the endoscope had been pulled back  into the stomach and placed in retroflexion to view the stomach from  below.  The endoscope was straightened and withdrawn, taking  circumferential views of remaining gastric and esophageal mucosa.  The  patient's vital signs and pulse oximeter remained stable.  The patient  tolerated procedure well without apparent complications.   FINDINGS:  Barrett's esophagus, biopsied.  Await biopsy report.  The  patient will call me for results and follow-up with me as an outpatient.           ______________________________  Waverly Ferrari, M.D.     GMO/MEDQ  D:  01/03/2008  T:  01/03/2008  Job:  [id number]

## 2010-12-31 NOTE — Procedures (Signed)
CAROTID DUPLEX EXAM   INDICATION:  Follow-up evaluation of known carotid artery disease.   HISTORY:  Diabetes:  No.  Cardiac:  Coronary artery bypass graft in 1997.  Hypertension:  Yes.  Smoking:  No.  Previous Surgery:  Left carotid endarterectomy on 04/26/07.  Right  carotid endarterectomy on 08/04/07 with bovine pericardium patch by Dr.  Trula Slade IV.  CV History:  Patient reports no recent cerebrovascular symptoms;  however, did have a paresthesia on the right side accompanied by a  stroke in 2008.  Amaurosis Fugax No, Paresthesias No, Hemiparesis No                                       RIGHT             LEFT  Brachial systolic pressure:         150               158  Brachial Doppler waveforms:         Triphasic         Triphasic  Vertebral direction of flow:        Antegrade         Antegrade  DUPLEX VELOCITIES (cm/sec)  CCA peak systolic                   84                82  ECA peak systolic                   107               81  ICA peak systolic                   84                [id number]  ICA end diastolic                   30                39  PLAQUE MORPHOLOGY:                  None              None  PLAQUE AMOUNT:                      None              None  PLAQUE LOCATION:                    None              None   IMPRESSION:  No internal carotid artery stenosis, status post  endarterectomy bilaterally.   ___________________________________________  V. Leia Alf, MD   MC/MEDQ  D:  02/07/2008  T:  02/07/2008  Job:  [id number]

## 2011-01-03 NOTE — Op Note (Signed)
NAME:  [name], [name]                            ACCOUNT NO.:  [id number]   MEDICAL RECORD NO.:  [id number]                   PATIENT TYPE:  AMB   LOCATION:  ENDO                                 FACILITY:  [place]   PHYSICIAN:  Waverly Ferrari, M.D.                 DATE OF BIRTH:  August 04, 1934   DATE OF PROCEDURE:  05/02/2004  DATE OF DISCHARGE:                                 OPERATIVE REPORT   PROCEDURE:  Upper endoscopy with biopsy.   INDICATIONS FOR PROCEDURE:  Gastroesophageal reflux disease.   ENDOSCOPIST:  Waverly Ferrari, M.D.   ANESTHESIA:  Demerol 40 mg and Versed 5 mg.   DESCRIPTION OF PROCEDURE:  With the patient mildly sedated in the left  lateral decubitus position the Olympus videoscopic endoscope was inserted in  the mouth and passed under direct vision through the esophagus which  appeared normal and to the distal esophagus and it did not open fully, so we  photographed this area and took a biopsy to rule out possible Barrett's.  We  entered into the stomach and the fundus, body and antrum were visualized and  all appeared normal.  However, the duodenum showed multiple erosions,  probably NSAID induced and a photograph was taken.  The second portion of  the duodenum was normal.  From this point the endoscope was slowly withdrawn  taking circumferential views of the duodenal mucosa until the endoscope was  pulled back into the stomach and placed in retroflexion to view the stomach  from below. The endoscope was then straightened and withdrawn taking  circumferential views of the remaining gastric and esophageal mucosa taking  an antral biopsy and once again, biopsies of the distal esophagus area.  The  patient's vital signs and pulse oximeter remained stable.  The patient  tolerated the procedure well and there were no apparent complications.   FINDINGS:  1.  Multiple erosions of the duodenum, probably NSAID induced; await biopsy      report of the antrum.  The patient  probably should be on a proton pump      inhibitor if he is on NSAIDs and the biopsies of the distal esophagus      also taken; await biopsy report.   PLAN:  The patient will call me for the results and follow-up with me as an  outpatient.                                               Waverly Ferrari, M.D.    GMO/MEDQ  D:  05/02/2004  T:  05/02/2004  Job:  [id number]

## 2011-01-03 NOTE — Op Note (Signed)
NAMEMAXINE, NISH NO.:  [id number]   MEDICAL RECORD NO.:  [id number]          PATIENT TYPE:  AMB   LOCATION:  ENDO                         FACILITY:  [place]   PHYSICIAN:  Waverly Ferrari, M.D.    DATE OF BIRTH:  [birth year]   DATE OF PROCEDURE:  DATE OF DISCHARGE:  05/02/2004                                 OPERATIVE REPORT   PROCEDURE PERFORMED:  Colonoscopy.   ENDOSCOPIST:  Waverly Ferrari, M.D.   ANESTHESIA:  Demerol 50 mg, Versed 4 mg.   DESCRIPTION OF PROCEDURE:  With the patient mildly sedated in the left  lateral decubitus position, the Olympus videoscopic colonoscope was inserted  in the rectum and passed under direct vision to the cecum, identified by the  ileocecal valve and appendiceal orifice, both of which were photographed.  From this point the colonoscope was slowly withdrawn taking circumferential  views of the colonic mucosa stopping only in the rectum which appeared  normal on direct and showed hemorrhoids on retroflex view.  The endoscope  was straightened and withdrawn.  The patient's vital signs and pulse  oximeter remained stable.  The patient tolerated the procedure well without  apparent complications.   FINDINGS:  Internal hemorrhoids.       GMO/MEDQ  D:  07/08/2004  T:  07/08/2004  Job:  [id number]

## 2011-01-03 NOTE — Op Note (Signed)
NAMEJYRESE, PACKWOOD NO.:  [id number]   MEDICAL RECORD NO.:  [id number]          PATIENT TYPE:  AMB   LOCATION:  ENDO                         FACILITY:  [facility]   PHYSICIAN:  Waverly Ferrari, M.D.    DATE OF BIRTH:  05/22/1934   DATE OF PROCEDURE:  10/10/2005  DATE OF DISCHARGE:                                 OPERATIVE REPORT   PROCEDURE:  Endoscopy.   INDICATIONS:  Barrett's esophagus.   ANESTHESIA:  Demerol 40, Versed 5 mg.   PROCEDURE:  With patient mildly sedated in the left lateral decubitus  position, the Olympus videoscopic endoscope was inserted in the mouth,  passed under direct vision through the esophagus which appeared normal until  we reached the distal esophagus, and there were changes of Barrett's,  photographed and biopsied.  We entered into the stomach.  Fundus, body,  antrum, duodenal bulb, second portion of duodenum were visualized.  From  this point, the endoscope was slowly withdrawn taking circumferential views  of duodenal mucosa until the endoscope then pulled back into the stomach and  placed in retroflexion to view the stomach from below.  The endoscope was  straightened, withdrawn taking circumferential views of remaining gastric  and esophageal mucosa.  The patient's vital signs and pulse oximeter  remained stable.  The patient tolerated the procedure well without apparent  complications.   FINDINGS:  Barrett's esophagus, biopsied, await biopsy report.  The patient  will call me for results and follow up with me as an outpatient.           ______________________________  Waverly Ferrari, M.D.     GMO/MEDQ  D:  10/10/2005  T:  10/11/2005  Job:  [id number]

## 2011-01-03 NOTE — Op Note (Signed)
NAMECUNG, DRENNON NO.:  [id number]   MEDICAL RECORD NO.:  [id number]          PATIENT TYPE:  AMB   LOCATION:  ENDO                         FACILITY:  [place]   PHYSICIAN:  Waverly Ferrari, M.D.    DATE OF BIRTH:  Sep 13, 1933   DATE OF PROCEDURE:  05/02/2004  DATE OF DISCHARGE:  05/02/2004                                 OPERATIVE REPORT   PROCEDURE:  Colonoscopy.   ANESTHESIA:  Demerol 50, Versed 4 mg.   INDICATIONS FOR PROCEDURE:  Colon polyps.   PROCEDURE:  With the patient mildly sedated in the left lateral decubitus  position, the Olympus videoscopic colonoscope was inserted in the rectum and  passed under direct vision to the cecum identified by the ileocecal valve  and appendiceal orifice, both of which were photographed.  From this point,  the colonoscope was slowly withdrawn taking circumferential views of the  colonic mucosa stopping in the rectum which appeared normal on direct and  showed hemorrhoids on retroflex view.  The endoscope was straightened and  withdrawn.  The patient's vital signs and pulse oximeter remained stable.  The patient tolerated the procedure well without apparent complications.   FINDINGS:  Internal hemorrhoids, otherwise, unremarkable examination.   PLAN:  See endoscopy note for further details.       GMO/MEDQ  D:  07/23/2004  T:  07/23/2004  Job:  [id number]

## 2011-05-23 LAB — CBC
HCT: 39.7
Hemoglobin: 13.5
MCHC: 33.9
MCHC: 34.8
MCV: 87.7
RBC: 3.56 — ABNORMAL LOW
RBC: 4.44
RDW: 14.1
RDW: 14.3

## 2011-05-23 LAB — BASIC METABOLIC PANEL
BUN: 17
CO2: 24
Calcium: 8.4
Chloride: 111
Creatinine, Ser: 1.15
GFR calc Af Amer: >60
Glucose, Bld: 125 — ABNORMAL HIGH

## 2011-05-23 LAB — TYPE AND SCREEN: Antibody Screen: NEGATIVE

## 2011-05-23 LAB — COMPREHENSIVE METABOLIC PANEL
ALT: 32
Alkaline Phosphatase: 59
BUN: 27 — ABNORMAL HIGH
CO2: 27
GFR calc non Af Amer: 50 — ABNORMAL LOW
Glucose, Bld: 96
Potassium: 5.2 — ABNORMAL HIGH
Sodium: 139
Total Bilirubin: 0.9
Total Protein: 6.9

## 2011-05-23 LAB — URINALYSIS, ROUTINE W REFLEX MICROSCOPIC
Bilirubin Urine: NEGATIVE
Glucose, UA: NEGATIVE
Hgb urine dipstick: NEGATIVE
Ketones, ur: NEGATIVE
Protein, ur: NEGATIVE
Urobilinogen, UA: 0.2

## 2011-05-23 LAB — PROTIME-INR
INR: 1
Prothrombin Time: 13.4

## 2011-05-30 LAB — POCT I-STAT 3, ART BLOOD GAS (G3+)
Bicarbonate: 24.9 — ABNORMAL HIGH
Patient temperature: 98.6
TCO2: 26
pCO2 arterial: 43.9
pH, Arterial: 7.362

## 2011-05-30 LAB — CBC
HCT: 30.6 — ABNORMAL LOW
HCT: 36.1 — ABNORMAL LOW
HCT: 36.5 — ABNORMAL LOW
Hemoglobin: 10.5 — ABNORMAL LOW
Hemoglobin: 11.3 — ABNORMAL LOW
Hemoglobin: 12.5 — ABNORMAL LOW
MCHC: 34.1
MCHC: 34.3
MCHC: 34.3
MCHC: 34.5
MCHC: 34.6
MCV: 87.6
MCV: 88.2
MCV: 88.7
MCV: 88.8
MCV: 89.3
MCV: 89.7
Platelets: 158
Platelets: 167
Platelets: 175
RBC: 3.43 — ABNORMAL LOW
RBC: 3.71 — ABNORMAL LOW
RBC: 4.14 — ABNORMAL LOW
RDW: 13.8
RDW: 14.2 — ABNORMAL HIGH
RDW: 14.4 — ABNORMAL HIGH
WBC: 5.3
WBC: 6.3
WBC: 6.7

## 2011-05-30 LAB — HEMOGLOBIN A1C
Hgb A1c MFr Bld: 5.7
Hgb A1c MFr Bld: 5.9
Mean Plasma Glucose: 126
Mean Plasma Glucose: 133

## 2011-05-30 LAB — COMPREHENSIVE METABOLIC PANEL
ALT: 28
AST: 29
AST: 35
BUN: 25 — ABNORMAL HIGH
BUN: 29 — ABNORMAL HIGH
CO2: 22
CO2: 23
Calcium: 8.9
Calcium: 9.4
Chloride: 106
Creatinine, Ser: 1.41
Creatinine, Ser: 1.59 — ABNORMAL HIGH
Creatinine, Ser: 1.65 — ABNORMAL HIGH
GFR calc Af Amer: 50 — ABNORMAL LOW
GFR calc Af Amer: 52 — ABNORMAL LOW
GFR calc non Af Amer: 41 — ABNORMAL LOW
GFR calc non Af Amer: 43 — ABNORMAL LOW
Glucose, Bld: 103 — ABNORMAL HIGH
Glucose, Bld: 94
Glucose, Bld: 96
Sodium: 140
Total Bilirubin: 0.6
Total Protein: 6.2
Total Protein: 7.3

## 2011-05-30 LAB — URINALYSIS, ROUTINE W REFLEX MICROSCOPIC
Bilirubin Urine: NEGATIVE
Glucose, UA: NEGATIVE
Hgb urine dipstick: NEGATIVE
Ketones, ur: NEGATIVE
Nitrite: NEGATIVE
Protein, ur: NEGATIVE
Urobilinogen, UA: 0.2
pH: 6

## 2011-05-30 LAB — PROTIME-INR
INR: 1
INR: 1
INR: 1
Prothrombin Time: 13.3
Prothrombin Time: 13.6

## 2011-05-30 LAB — BASIC METABOLIC PANEL
CO2: 24
Chloride: 108
Creatinine, Ser: 1.11
GFR calc Af Amer: >60
Potassium: 4.1

## 2011-05-30 LAB — BLOOD GAS, ARTERIAL
FIO2: 0.21
O2 Saturation: 95.6
pCO2 arterial: 39.5
pO2, Arterial: 76.8 — ABNORMAL LOW

## 2011-05-30 LAB — CK TOTAL AND CKMB (NOT AT ARMC)
CK, MB: 3.9
Total CK: 266 — ABNORMAL HIGH

## 2011-05-30 LAB — DIFFERENTIAL
Basophils Absolute: 0
Eosinophils Relative: 2
Lymphocytes Relative: 13
Lymphocytes Relative: 29
Lymphs Abs: 1.8
Neutrophils Relative %: 58
Neutrophils Relative %: 78 — ABNORMAL HIGH

## 2011-05-30 LAB — APTT
aPTT: 30
aPTT: 33

## 2011-05-30 LAB — RAPID URINE DRUG SCREEN, HOSP PERFORMED
Amphetamines: NOT DETECTED
Barbiturates: NOT DETECTED
Benzodiazepines: NOT DETECTED
Cocaine: NOT DETECTED
Tetrahydrocannabinol: NOT DETECTED

## 2011-05-30 LAB — TYPE AND SCREEN
ABO/RH(D): O POS
Antibody Screen: NEGATIVE

## 2011-05-30 LAB — ABO/RH: ABO/RH(D): O POS

## 2011-05-30 LAB — CARDIAC PANEL(CRET KIN+CKTOT+MB+TROPI): Total CK: 92

## 2011-05-30 LAB — LIPID PANEL
Cholesterol: 119
HDL: 45
Total CHOL/HDL Ratio: 2.5
Triglycerides: 124
VLDL: 31

## 2011-05-30 LAB — HEPARIN LEVEL (UNFRACTIONATED)
Heparin Unfractionated: 0.28 — ABNORMAL LOW
Heparin Unfractionated: 0.72 — ABNORMAL HIGH

## 2011-05-30 LAB — HOMOCYSTEINE: Homocysteine: 10.5

## 2011-08-29 ENCOUNTER — Other Ambulatory Visit (HOSPITAL_COMMUNITY): Payer: Self-pay | Admitting: Internal Medicine

## 2011-09-01 MED ORDER — SODIUM CHLORIDE 0.9 % IJ SOLN: 3.0000 mL | INTRAMUSCULAR | Status: AC | PRN

## 2011-09-01 MED ORDER — SODIUM CHLORIDE 0.9 % IV SOLN: INTRAVENOUS | Status: DC

## 2011-09-03 ENCOUNTER — Encounter (HOSPITAL_COMMUNITY): Payer: Self-pay | Admitting: Pharmacy Technician

## 2011-09-16 ENCOUNTER — Ambulatory Visit (HOSPITAL_COMMUNITY): Admit: 2011-09-16 | Payer: Self-pay | Admitting: Internal Medicine

## 2011-09-16 ENCOUNTER — Encounter (HOSPITAL_COMMUNITY)
Admission: RE | Disposition: A | Discharge status: Home / Self Care | Payer: Self-pay | Source: Ambulatory Visit | Attending: Internal Medicine

## 2011-09-16 ENCOUNTER — Encounter (HOSPITAL_COMMUNITY): Payer: Self-pay | Admitting: Internal Medicine

## 2011-09-16 ENCOUNTER — Ambulatory Visit (HOSPITAL_COMMUNITY)
Admission: RE | Admit: 2011-09-16 | Discharge: 2011-09-19 | Disposition: A | Discharge status: Home / Self Care | Payer: Medicare Other | Source: Ambulatory Visit | Attending: Internal Medicine | Admitting: Internal Medicine

## 2011-09-16 DIAGNOSIS — I359 Nonrheumatic aortic valve disorder, unspecified: Secondary | ICD-10-CM | POA: Insufficient documentation

## 2011-09-16 DIAGNOSIS — Z951 Presence of aortocoronary bypass graft: Secondary | ICD-10-CM | POA: Insufficient documentation

## 2011-09-16 DIAGNOSIS — E785 Hyperlipidemia, unspecified: Secondary | ICD-10-CM | POA: Insufficient documentation

## 2011-09-16 DIAGNOSIS — N182 Chronic kidney disease, stage 2 (mild): Secondary | ICD-10-CM | POA: Insufficient documentation

## 2011-09-16 DIAGNOSIS — I10 Essential (primary) hypertension: Secondary | ICD-10-CM

## 2011-09-16 DIAGNOSIS — I272 Pulmonary hypertension, unspecified: Secondary | ICD-10-CM | POA: Diagnosis present

## 2011-09-16 DIAGNOSIS — I129 Hypertensive chronic kidney disease with stage 1 through stage 4 chronic kidney disease, or unspecified chronic kidney disease: Secondary | ICD-10-CM | POA: Insufficient documentation

## 2011-09-16 DIAGNOSIS — I350 Nonrheumatic aortic (valve) stenosis: Secondary | ICD-10-CM | POA: Diagnosis present

## 2011-09-16 DIAGNOSIS — I2510 Atherosclerotic heart disease of native coronary artery without angina pectoris: Secondary | ICD-10-CM | POA: Insufficient documentation

## 2011-09-16 DIAGNOSIS — I2789 Other specified pulmonary heart diseases: Secondary | ICD-10-CM | POA: Insufficient documentation

## 2011-09-16 HISTORY — DX: Chronic kidney disease, unspecified: N18.9

## 2011-09-16 HISTORY — PX: CARDIAC CATHETERIZATION: SHX172

## 2011-09-16 HISTORY — DX: Pulmonary hypertension, unspecified: I27.20

## 2011-09-16 HISTORY — DX: Essential (primary) hypertension: I10

## 2011-09-16 HISTORY — PX: LEFT AND RIGHT HEART CATHETERIZATION WITH CORONARY ANGIOGRAM: SHX5449

## 2011-09-16 HISTORY — DX: Chronic kidney disease, stage 2 (mild): N18.2

## 2011-09-16 HISTORY — DX: Atherosclerotic heart disease of native coronary artery without angina pectoris: I25.10

## 2011-09-16 LAB — POCT I-STAT 3, ART BLOOD GAS (G3+)
Bicarbonate: 19.7 mEq/L — ABNORMAL LOW (ref 20.0–24.0)
TCO2: 21 mmol/L (ref 0–100)
pCO2 arterial: 36.3 mmHg (ref 35.0–45.0)
pH, Arterial: 7.343 — ABNORMAL LOW (ref 7.350–7.450)

## 2011-09-16 LAB — BASIC METABOLIC PANEL
CO2: 25 mEq/L (ref 19–32)
Calcium: 9.2 mg/dL (ref 8.4–10.5)
Chloride: 106 mEq/L (ref 96–112)
Glucose, Bld: 99 mg/dL (ref 70–99)
Potassium: 5.3 mEq/L — ABNORMAL HIGH (ref 3.5–5.1)
Sodium: 138 mEq/L (ref 135–145)

## 2011-09-16 LAB — PROTIME-INR
INR: 1.07 (ref 0.00–1.49)
Prothrombin Time: 14.1 seconds (ref 11.6–15.2)

## 2011-09-16 LAB — POCT I-STAT 3, VENOUS BLOOD GAS (G3P V)
O2 Saturation: 56 %
TCO2: 23 mmol/L (ref 0–100)

## 2011-09-16 LAB — CBC
Hemoglobin: 13 g/dL (ref 13.0–17.0)
MCH: 29.6 pg (ref 26.0–34.0)
Platelets: 135 10*3/uL — ABNORMAL LOW (ref 150–400)
RBC: 4.39 MIL/uL (ref 4.22–5.81)
WBC: 6 10*3/uL (ref 4.0–10.5)

## 2011-09-16 SURGERY — LEFT AND RIGHT HEART CATHETERIZATION WITH CORONARY/GRAFT ANGIOGRAM
Anesthesia: LOCAL

## 2011-09-16 SURGERY — LEFT AND RIGHT HEART CATHETERIZATION WITH CORONARY ANGIOGRAM
Anesthesia: Moderate Sedation | Laterality: Left

## 2011-09-16 MED ORDER — HEPARIN (PORCINE) IN NACL 2-0.9 UNIT/ML-% IJ SOLN
INTRAMUSCULAR | Status: AC
  Filled 2011-09-16: qty 2000

## 2011-09-16 MED ORDER — EZETIMIBE 10 MG PO TABS
10.0000 mg | ORAL_TABLET | Freq: Every day | ORAL | Status: DC
  Administered 2011-09-16 – 2011-09-19 (×3): 10 mg via ORAL
  Filled 2011-09-16 (×4): qty 1

## 2011-09-16 MED ORDER — METOPROLOL TARTRATE 25 MG PO TABS
25.0000 mg | ORAL_TABLET | Freq: Two times a day (BID) | ORAL | Status: DC
  Administered 2011-09-16 – 2011-09-19 (×6): 25 mg via ORAL
  Filled 2011-09-16 (×7): qty 1

## 2011-09-16 MED ORDER — ROSUVASTATIN CALCIUM 20 MG PO TABS
20.0000 mg | ORAL_TABLET | Freq: Every day | ORAL | Status: DC
  Administered 2011-09-16 – 2011-09-19 (×3): 20 mg via ORAL
  Filled 2011-09-16 (×4): qty 1

## 2011-09-16 MED ORDER — THERA M PLUS PO TABS
1.0000 | ORAL_TABLET | Freq: Every day | ORAL | Status: DC
  Administered 2011-09-17: 1 via ORAL
  Filled 2011-09-16 (×3): qty 1

## 2011-09-16 MED ORDER — NITROGLYCERIN 0.2 MG/ML ON CALL CATH LAB
INTRAVENOUS | Status: AC
  Filled 2011-09-16: qty 1

## 2011-09-16 MED ORDER — SODIUM CHLORIDE 0.9 % IV SOLN
1.0000 mL/kg/h | INTRAVENOUS | Status: AC
  Administered 2011-09-16: 1 mL/kg/h via INTRAVENOUS

## 2011-09-16 MED ORDER — ASPIRIN 81 MG PO CHEW
CHEWABLE_TABLET | ORAL | Status: AC
  Filled 2011-09-16: qty 4

## 2011-09-16 MED ORDER — HYDRALAZINE HCL 20 MG/ML IJ SOLN
INTRAMUSCULAR | Status: AC
  Filled 2011-09-16: qty 1

## 2011-09-16 MED ORDER — AMLODIPINE BESYLATE 10 MG PO TABS
10.0000 mg | ORAL_TABLET | Freq: Every day | ORAL | Status: DC
  Administered 2011-09-16 – 2011-09-19 (×4): 10 mg via ORAL
  Filled 2011-09-16 (×4): qty 1

## 2011-09-16 MED ORDER — LIDOCAINE HCL (PF) 1 % IJ SOLN
INTRAMUSCULAR | Status: AC
  Filled 2011-09-16: qty 30

## 2011-09-16 MED ORDER — NIACIN ER (ANTIHYPERLIPIDEMIC) 500 MG PO TBCR
500.0000 mg | EXTENDED_RELEASE_TABLET | Freq: Every day | ORAL | Status: DC
  Administered 2011-09-16 – 2011-09-19 (×3): 500 mg via ORAL
  Filled 2011-09-16 (×4): qty 1

## 2011-09-16 MED ORDER — LOSARTAN POTASSIUM 50 MG PO TABS
100.0000 mg | ORAL_TABLET | Freq: Every day | ORAL | Status: DC
  Administered 2011-09-16: 100 mg via ORAL
  Filled 2011-09-16: qty 2

## 2011-09-16 MED ORDER — ASPIRIN 81 MG PO CHEW
324.0000 mg | CHEWABLE_TABLET | ORAL | Status: AC
  Administered 2011-09-16: 324 mg via ORAL

## 2011-09-16 MED ORDER — SODIUM CHLORIDE 0.9 % IV SOLN
1.0000 mL/kg/h | INTRAVENOUS | Status: DC
  Administered 2011-09-16: 1 mL/kg/h via INTRAVENOUS

## 2011-09-16 MED ORDER — LOSARTAN POTASSIUM-HCTZ 100-25 MG PO TABS
1.0000 | ORAL_TABLET | Freq: Every day | ORAL | Status: DC
Start: 2011-09-16 — End: 2011-09-16

## 2011-09-16 MED ORDER — ASPIRIN EC 81 MG PO TBEC
81.0000 mg | DELAYED_RELEASE_TABLET | Freq: Every day | ORAL | Status: DC
  Filled 2011-09-16 (×3): qty 1

## 2011-09-16 MED ORDER — HYDROCHLOROTHIAZIDE 25 MG PO TABS
25.0000 mg | ORAL_TABLET | Freq: Every day | ORAL | Status: DC
  Administered 2011-09-16: 25 mg via ORAL
  Filled 2011-09-16: qty 1

## 2011-09-16 MED ORDER — OMEGA-3-ACID ETHYL ESTERS 1 G PO CAPS
1.0000 g | ORAL_CAPSULE | Freq: Three times a day (TID) | ORAL | Status: DC
  Administered 2011-09-16 – 2011-09-19 (×7): 1 g via ORAL
  Filled 2011-09-16 (×11): qty 1

## 2011-09-16 MED ORDER — ONDANSETRON HCL 4 MG/2ML IJ SOLN: 4.0000 mg | Freq: Four times a day (QID) | INTRAMUSCULAR | Status: DC | PRN

## 2011-09-16 MED ORDER — ACETAMINOPHEN 325 MG PO TABS: 650.0000 mg | ORAL_TABLET | ORAL | Status: DC | PRN

## 2011-09-16 NOTE — Op Note (Signed)
[place]     CARDIAC CATHETERIZATION REPORT  [name]   [id number] [birth year]  Performing Cardiologist: Pixie Casino Primary Physician: No primary provider on file. Primary Cardiologist:  Dr. Debara Pickett  Procedures Performed:  Left Heart Catheterization via 6 Fr right femoral artery access  Right Heart Catheterization via 7 Fr right femoral vein access  Native Coronary Angiography  Internal Mammary Graft Angiography  Saphenous Vein Graft  IC NTG Injection  Indication(s): dyspnea  History: 78 y.o. male with a history of coronary disease status post three-vessel CABG with LIMA to LAD, SVG to OM1, SVG to OM 2 in the late 1990s. He underwent stress testing in February 2012 which showed no ischemia.  He does have a history of aortic stenosis which is mild to moderate by echo with a valve area 1.5-1.6 cm2 and mild pulmonary hypertension. He reports recent decrease in exercise tolerance, shortness of breath and symptoms similar to prior to his bypass.  Consent: The procedure with Risks/Benefits/Alternatives and Indications was reviewed with the patient.  All questions were answered.    Risks / Complications include, but not limited to: Death, MI, CVA/TIA, VF/VT (with defibrillation), Bradycardia (need for temporary pacer placement), contrast induced nephropathy, bleeding / bruising / hematoma / pseudoaneurysm, vascular or coronary injury (with possible emergent CT or Vascular Surgery), adverse medication reactions, infection.    The patient voices understanding and agree to proceed.    Risks of procedure as well as the alternatives and risks of each were explained to the (patient/caregiver).  Consent for procedure obtained. Consent for signed by MD and patient with RN witness -- placed on chart.  Procedure: The patient was brought to the [facility] in the fasting state and prepped and draped in the usual sterile  fashion forRight groin access.  Sterile technique was used including antiseptics, cap, gloves, gown, hand hygiene, mask and sheet.  Skin prep: Chlorhexidine;  Time Out: Verified patient identification, verified procedure, site/side was marked, verified correct patient position, special equipment/implants available, medications/allergies/relevent history reviewed, required imaging and test results available.  Performed  The right femoral head was identified using tactile and fluoroscopic technique.  The right groin was anesthetized with 1% subcutaneous Lidocaine.  The right Common Femoral Artery and vein were accessed using the Modified Seldinger Technique with placement of a antimicrobial bonded/coated single lumen (6 Fr) sheath in the artery and a 8F sheath in the vein was placed using the Seldinger technique. A 5 Fr JL4 Catheter was advanced of over a Standard J wire into the ascending Aorta.  The catheter was used to engage the left coronary artery.  Multiple cineangiographic views of the left coronary artery system(s) were performed. A 5 Fr JR4 Catheter was advanced of over a Safety J wire into the ascending Aorta.  The catheter was used to engage the right coronary artery.   a Williams right catheter was also used to engage the right coronary. Multiple cineangiographic views of the right coronary artery system(s) were performed. The right cardiac catheterization and used to engage the bypass graft to the OM1 and OM 2 vessels. This catheter was then exchanged over the standard J wire for an angled Pigtail catheter that was advanced across the Aortic Valve.  LV hemodynamics were measured.  LV hemodynamics were then re-sampled, and the catheter was pulled back across the Aortic Valve for measurement of "pull-back" gradient.  The catheter and the wire was removed completely out of the body.  The Swan-Ganz catheter was advanced through the vein using standard technique into the right atrial, right  ventricular, pulmonary artery and palmar temporary wedge positions. Venous and arterial oxygen saturations were obtained as well as cardiac output calculations. The Swan-Ganz catheter was removed.  The patient was transferred to the holding area where the sheath was removed with manual pressure held for hemostasis.   The patient was transported to the cath lab holding area in stable condition.   The patient  was stable before, during and following the procedure.   Patient did tolerate procedure well. There were not complications.  EBL: 10 cc  Medications:  Contrast:  70 cc omnipaque  100 mcg IC nitroglycerin  Hemodynamics:  Central Aortic Pressure / Mean Aortic Pressure: 196/89  LV Pressure / LV End diastolic Pressure:  24  Coronary Angiographic Data:  Left Main:  Moderate distal tapering disease up to 50%  Left Anterior Descending (LAD):  Occluded proximally and a previously placed stent  1st diagonal (D1):  Small vessel less than 2 mm  Circumflex (LCx):  Subtotally occluded at the ostium and totally occluded at the ostium of a proximally placed stent.  1st obtuse marginal:  Small vessel which is bypassed distally  2nd obtuse marginal:  Larger vessel which is bypassed distally   Right Coronary Artery: Dominant vessel. There is severe 90% calcified ostial stenosis with no reflux using a 5 French catheter. There is no significant change with intracoronary nitroglycerin. There is a moderate mid vessel disease.  right ventricle branch of right coronary artery: No significant  posterior descending artery: Mild luminal irregularities  Grafts  LIMA - LAD: Patent with good distal runoff  SVG - OM1: Patent with TIMI-3 flow and good distal runoff  SVG - OM2: Patent with TIMI-3 flow and good distal runoff  Impression: 1.  Severe 90% ostial right coronary artery disease which is calcified 2.  Normal to low normal right heart pressures 3.  Uncontrolled hypertension with elevated  LVEDP 4.  Stage I to stage II chronic kidney disease  Plan: 1.  Admit for hydration and blood pressure control. 2.  Will likely need PCI to the ostial RCA, this is calcified, and he may need rotablation +/- temporary pacer support - will d/w Dr. Claiborne Billings.  The case and results was discussed with the patient and family. The case and results was not discussed with the patient's PCP. The case and results was discussed with the patient's Cardiologist.  Time Spend Directly with Patient:  60 minutes  Pixie Casino, MD Attending Cardiologist [facility] 09/16/2011, 10:53 AM

## 2011-09-16 NOTE — H&P (Signed)
     [facility]          INTERVAL PROCEDURE H&P   History and Physical Interval Note:  09/16/2011 9:55 AM  [name] has presented today for their planned procedure. The various methods of treatment have been discussed with the patient and family. After consideration of risks, benefits and other options for treatment, the patient has consented to the procedure.  The patients' outpatient history has been reviewed, patient examined, and no change in status from most recent office note within the past 30 days. I have reviewed the patients' chart and labs and will proceed as planned. Questions were answered to the patient's satisfaction.   Pixie Casino, MD Attending Cardiologist [facility] 09/16/2011, 9:55 AM

## 2011-09-17 ENCOUNTER — Other Ambulatory Visit: Payer: Self-pay

## 2011-09-17 ENCOUNTER — Encounter (HOSPITAL_COMMUNITY): Payer: Self-pay | Admitting: Cardiology

## 2011-09-17 DIAGNOSIS — I272 Pulmonary hypertension, unspecified: Secondary | ICD-10-CM | POA: Diagnosis present

## 2011-09-17 HISTORY — DX: Pulmonary hypertension, unspecified: I27.20

## 2011-09-17 LAB — BASIC METABOLIC PANEL
BUN: 33 mg/dL — ABNORMAL HIGH (ref 6–23)
CO2: 24 mEq/L (ref 19–32)
Calcium: 9.2 mg/dL (ref 8.4–10.5)
Creatinine, Ser: 1.58 mg/dL — ABNORMAL HIGH (ref 0.50–1.35)
Glucose, Bld: 89 mg/dL (ref 70–99)

## 2011-09-17 MED ORDER — SODIUM CHLORIDE 0.9 % IV SOLN: 250.0000 mL | INTRAVENOUS | Status: DC | PRN

## 2011-09-17 MED ORDER — SODIUM CHLORIDE 0.9 % IV SOLN
1.0000 mL/kg/h | INTRAVENOUS | Status: DC
  Administered 2011-09-17 – 2011-09-18 (×2): 1 mL/kg/h via INTRAVENOUS

## 2011-09-17 MED ORDER — ASPIRIN 81 MG PO CHEW
324.0000 mg | CHEWABLE_TABLET | ORAL | Status: AC
  Administered 2011-09-18: 324 mg via ORAL
  Filled 2011-09-17: qty 4
  Filled 2011-09-17: qty 1

## 2011-09-17 MED ORDER — SODIUM CHLORIDE 0.9 % IJ SOLN: 3.0000 mL | INTRAMUSCULAR | Status: DC | PRN

## 2011-09-17 MED ORDER — SODIUM CHLORIDE 0.9 % IJ SOLN
3.0000 mL | Freq: Two times a day (BID) | INTRAMUSCULAR | Status: DC
  Administered 2011-09-17: 3 mL via INTRAVENOUS

## 2011-09-17 NOTE — Progress Notes (Signed)
Subjective: No chest pain. No current complaints.  09/16/11   Cardiac  Cath:  Severe 90% ostial right coronary artery disease which is calcified  2. Normal to low normal right heart pressures  3. Uncontrolled hypertension with elevated LVEDP  4. Stage I to stage II chronic kidney disease  Plan:  1. Admit for hydration and blood pressure control.  2. Will likely need PCI to the ostial RCA, this is calcified, and he may need rotablation +/- temporary pacer support - will d/w Dr. Claiborne Billings.     Objective: Vital signs in last 24 hours: Temp:  [97.7 F (36.5 C)-98.5 F (36.9 C)] 97.8 F (36.6 C) (01/30 0622) Pulse Rate:  [57-93] 60  (01/30 0622) Resp:  [15-20] 17  (01/30 0622) BP: (96-166)/(48-92) 131/73 mmHg (01/30 0622) SpO2:  [96 %-100 %] 99 % (01/30 0622) Weight:  [84.5 kg (186 lb 4.6 oz)] 84.5 kg (186 lb 4.6 oz) (01/30 0622) Weight change:  Last BM Date: 09/15/11 Intake/Output from previous day: -28.6 01/29 0701 - 01/30 0700 In: 821.5 [P.O.:360; I.V.:461.5] Out: 850 [Urine:850] Intake/Output this shift:    PE: General:A&O X 3 MAE, follows commands [id number] RRR II/VI systolic murmur. Lungs:clear without rales, rhonchi or wheezes. Abd:+ BS, soft, non tender.  Ext:No edema, Rt. Groin without hematoma    Lab Results:  [facility] 09/16/11 0753  WBC 6.0  HGB 13.0  HCT 39.0  PLT 135*   BMET  Basename 09/17/11 0340 09/16/11 0753  NA 136 138  K 4.9 5.3*  CL 104 106  CO2 24 25  GLUCOSE 89 99  BUN 33* 38*  CREATININE 1.58* 1.42*  CALCIUM 9.2 9.2   No results found for this basename: TROPONINI:2,CK,MB:2 in the last 72 hours  Lab Results  Component Value Date   CHOL  Value: 119        ATP III CLASSIFICATION:  <200     mg/dL   Desirable  200-239  mg/dL   Borderline High  >=240    mg/dL   High 04/25/2007   HDL 45 04/25/2007   LDLCALC  Value: 49        Total Cholesterol/HDL:CHD Risk Coronary Heart Disease Risk Table                     Men   Women  1/2 Average Risk   3.4   3.3  04/25/2007   TRIG 124 04/25/2007   CHOLHDL 2.6 04/25/2007   Lab Results  Component Value Date   HGBA1C  Value: 5.7 (NOTE)   The ADA recommends the following therapeutic goals for glycemic   control related to Hgb A1C measurement:   Goal of Therapy:   < 7.0% Hgb A1C   Action Suggested:  > 8.0% Hgb A1C   Ref:  Diabetes Care, 22, Suppl. 1, 1999 04/24/2007     No results found for this basename: TSH    Hepatic Function Panel No results found for this basename: PROT,ALBUMIN,AST,ALT,ALKPHOS,BILITOT,BILIDIR,IBILI in the last 72 hours No results found for this basename: CHOL in the last 72 hours No results found for this basename: PROTIME in the last 72 hours    EKG: Orders placed in visit on 09/17/11  . EKG 12-LEAD    Studies/Results: No results found.  Medications: I have reviewed the patient's current medications.    Marland Kitchen amLODipine  10 mg Oral Daily  . aspirin      . aspirin  324 mg Oral Pre-Cath  . aspirin EC  81 mg  Oral Daily  . ezetimibe  10 mg Oral Daily  . heparin      . lidocaine      . metoprolol tartrate  25 mg Oral BID  . multivitamins ther. w/minerals  1 tablet Oral Daily  . niacin  500 mg Oral QHS  . nitroGLYCERIN      . omega-3 acid ethyl esters  1 g Oral TID  . rosuvastatin  20 mg Oral q1800  . DISCONTD: hydrochlorothiazide  25 mg Oral Daily  . DISCONTD: losartan  100 mg Oral Daily  . DISCONTD: losartan-hydrochlorothiazide  1 tablet Oral Daily   Assessment/Plan: Patient Active Problem List  Diagnoses  . Dyspnea on exertion, anginal equivilant  . CAD (coronary artery disease), CABG 1996.  Marland Kitchen HTN (hypertension),uncontrolled  . CKD (chronic kidney disease) stage 2, GFR 60-89 ml/min  . Aortic stenosis, moderate  . Pulmonary hypertension, mild   PLAN:PCI in AM with Dr.Kelly.  Cr. Up slightly after cardiac cath.  Stopped losartan and hctz for elevated CR.  BP stable.   LOS: 1 day   INGOLD,LAURA R 09/17/2011, 8:45 AM  I have seen and examined the patient along with  [facility] R, NP.  I have reviewed the chart, notes and new data.  I agree with NP's note.  Key new complaints: No angina or dyspnea. No cath site complaints Key examination changes: No access site hematoma Key new findings / data: mild increase in creatinine noted; receiving IVF  PLAN: PTRCA in AM.  Sanda Klein, MD, [facility] [phone] 09/17/2011, 11:11 AM

## 2011-09-18 ENCOUNTER — Other Ambulatory Visit: Payer: Self-pay

## 2011-09-18 ENCOUNTER — Encounter (HOSPITAL_COMMUNITY)
Admission: RE | Disposition: A | Discharge status: Home / Self Care | Payer: Self-pay | Source: Ambulatory Visit | Attending: Internal Medicine

## 2011-09-18 HISTORY — PX: TEMPORARY PACEMAKER INSERTION: SHX5471

## 2011-09-18 HISTORY — PX: PERCUTANEOUS CORONARY STENT INTERVENTION (PCI-S): SHX5485

## 2011-09-18 HISTORY — PX: PERCUTANEOUS CORONARY ROTOBLATOR INTERVENTION (PCI-R): SHX5484

## 2011-09-18 HISTORY — PX: CARDIAC CATHETERIZATION: SHX172

## 2011-09-18 LAB — CBC
Hemoglobin: 12.4 g/dL — ABNORMAL LOW (ref 13.0–17.0)
MCV: 88.5 fL (ref 78.0–100.0)
Platelets: 123 10*3/uL — ABNORMAL LOW (ref 150–400)
RBC: 4.19 MIL/uL — ABNORMAL LOW (ref 4.22–5.81)
WBC: 4.9 10*3/uL (ref 4.0–10.5)

## 2011-09-18 LAB — POCT ACTIVATED CLOTTING TIME: Activated Clotting Time: 496 seconds

## 2011-09-18 LAB — BASIC METABOLIC PANEL
BUN: 32 mg/dL — ABNORMAL HIGH (ref 6–23)
CO2: 22 mEq/L (ref 19–32)
Chloride: 107 mEq/L (ref 96–112)
GFR calc Af Amer: 52 mL/min — ABNORMAL LOW (ref >90)
Glucose, Bld: 107 mg/dL — ABNORMAL HIGH (ref 70–99)
Potassium: 4.8 mEq/L (ref 3.5–5.1)

## 2011-09-18 SURGERY — PERCUTANEOUS CORONARY STENT INTERVENTION (PCI-S)
Anesthesia: LOCAL

## 2011-09-18 MED ORDER — SODIUM CHLORIDE 0.9 % IV SOLN
INTRAVENOUS | Status: DC
  Administered 2011-09-19: 01:00:00 via INTRAVENOUS

## 2011-09-18 MED ORDER — TICAGRELOR 90 MG PO TABS
ORAL_TABLET | ORAL | Status: AC
  Filled 2011-09-18: qty 2

## 2011-09-18 MED ORDER — MIDAZOLAM HCL 2 MG/2ML IJ SOLN
INTRAMUSCULAR | Status: AC
  Filled 2011-09-18: qty 2

## 2011-09-18 MED ORDER — ACETAMINOPHEN 325 MG PO TABS: 650.0000 mg | ORAL_TABLET | ORAL | Status: DC | PRN

## 2011-09-18 MED ORDER — NITROGLYCERIN 0.2 MG/ML ON CALL CATH LAB
INTRAVENOUS | Status: AC
  Filled 2011-09-18: qty 1

## 2011-09-18 MED ORDER — FENTANYL CITRATE 0.05 MG/ML IJ SOLN
INTRAMUSCULAR | Status: AC
  Filled 2011-09-18: qty 2

## 2011-09-18 MED ORDER — ONDANSETRON HCL 4 MG/2ML IJ SOLN: 4.0000 mg | Freq: Four times a day (QID) | INTRAMUSCULAR | Status: DC | PRN

## 2011-09-18 MED ORDER — BIVALIRUDIN 250 MG IV SOLR
INTRAVENOUS | Status: AC
  Filled 2011-09-18: qty 250

## 2011-09-18 MED ORDER — HEPARIN (PORCINE) IN NACL 2-0.9 UNIT/ML-% IJ SOLN
INTRAMUSCULAR | Status: AC
  Filled 2011-09-18: qty 2000

## 2011-09-18 MED ORDER — SODIUM CHLORIDE 0.9 % IV SOLN: 0.2500 mg/kg/h | INTRAVENOUS | Status: AC

## 2011-09-18 MED ORDER — LIDOCAINE HCL (PF) 1 % IJ SOLN
INTRAMUSCULAR | Status: AC
  Filled 2011-09-18: qty 30

## 2011-09-18 MED ORDER — TICAGRELOR 90 MG PO TABS
90.0000 mg | ORAL_TABLET | Freq: Two times a day (BID) | ORAL | Status: DC
  Administered 2011-09-19 (×2): 90 mg via ORAL
  Filled 2011-09-18 (×4): qty 1

## 2011-09-18 MED ORDER — VERAPAMIL HCL 2.5 MG/ML IV SOLN
INTRAVENOUS | Status: AC
  Filled 2011-09-18: qty 4

## 2011-09-18 MED ORDER — ISOSORBIDE MONONITRATE ER 30 MG PO TB24
30.0000 mg | ORAL_TABLET | Freq: Every day | ORAL | Status: DC
  Administered 2011-09-18: 30 mg via ORAL
  Filled 2011-09-18 (×2): qty 1

## 2011-09-18 NOTE — Brief Op Note (Signed)
09/16/2011 - 09/18/2011  4:46 PM  PATIENT:  [name]  78 y.o. male  PRE-OPERATIVE DIAGNOSIS:  cad  Complex PCI:  PROCEDURE:  Procedure(s): PERCUTANEOUS CORONARY STENT INTERVENTION (PCI-S)/PTCA TEMPORARY PACEMAKER INSERTION PERCUTANEOUS CORONARY ROTOBLATOR INTERVENTION (PCI-R)  Full note dictated; see diagram   DICTATION # [id number], CSN [id number]  Successful PCI/HSRA/PTCA/STENT with calcified ostial RCA 90 - 95% stenosis to 0% with 3.0 x12 DES Promus stent postdilated to 3.26 mm.  Troy Sine, MD, [facility] 09/18/2011 4:46 PM

## 2011-09-18 NOTE — Progress Notes (Signed)
UR Completed.  [name] [id number] 09/18/2011

## 2011-09-18 NOTE — Progress Notes (Signed)
Post complex PCI: Cont bival at 0.25mg /kg/hr  for 4 hrs post PCI or when bag completed prior to 4 hrs.

## 2011-09-18 NOTE — Progress Notes (Signed)
Subjective:  No CP/SOB.  Objective:  Temp:  [97.7 F (36.5 C)-98.1 F (36.7 C)] 97.7 F (36.5 C) (01/31 0510) Pulse Rate:  [54-79] 79  (01/31 0510) Resp:  [17-20] 20  (01/31 0510) BP: (118-156)/(53-66) 133/66 mmHg (01/31 0510) SpO2:  [97 %-100 %] 97 % (01/31 0510) Weight:  [84.4 kg (186 lb 1.1 oz)] 84.4 kg (186 lb 1.1 oz) (01/31 0510) Weight change: 0.484 kg (1 lb 1.1 oz)  Intake/Output from previous day: 01/30 0701 - 01/31 0700 In: 483 [P.O.:480; I.V.:3] Out: -   Intake/Output from this shift:    Physical Exam: General appearance: alert and cooperative Neck: no adenopathy, no carotid bruit, no JVD, supple, symmetrical, trachea midline and thyroid not enlarged, symmetric, no tenderness/mass/nodules Lungs: clear to auscultation bilaterally Heart: regular rate and rhythm, S1, S2 normal, no murmur, click, rub or gallop Extremities: extremities normal, atraumatic, no cyanosis or edema  Lab Results: Results for orders placed during the hospital encounter of 09/16/11 (from the past 48 hour(s))  POCT I-STAT 3, BLOOD GAS (G3+)     Status: Abnormal   Collection Time   09/16/11 10:13 AM      Component Value Range Comment   pH, Arterial 7.343 (*) 7.350 - 7.450     pCO2 arterial 36.3  35.0 - 45.0 (mmHg)    pO2, Arterial 80.0  80.0 - 100.0 (mmHg)    Bicarbonate 19.7 (*) 20.0 - 24.0 (mEq/L)    TCO2 21  0 - 100 (mmol/L)    O2 Saturation 95.0      Acid-base deficit 5.0 (*) 0.0 - 2.0 (mmol/L)    Sample type ARTERIAL     POCT I-STAT 3, BLOOD GAS (G3P V)     Status: Abnormal   Collection Time   09/16/11 10:14 AM      Component Value Range Comment   pH, Ven 7.304 (*) 7.250 - 7.300     pCO2, Ven 43.0 (*) 45.0 - 50.0 (mmHg)    pO2, Ven 32.0  30.0 - 45.0 (mmHg)    Bicarbonate 21.4  20.0 - 24.0 (mEq/L)    TCO2 23  0 - 100 (mmol/L)    O2 Saturation 56.0      Acid-base deficit 5.0 (*) 0.0 - 2.0 (mmol/L)    Sample type VENOUS     BASIC METABOLIC PANEL     Status: Abnormal   Collection  Time   09/17/11  3:40 AM      Component Value Range Comment   Sodium 136  135 - 145 (mEq/L)    Potassium 4.9  3.5 - 5.1 (mEq/L)    Chloride 104  96 - 112 (mEq/L)    CO2 24  19 - 32 (mEq/L)    Glucose, Bld 89  70 - 99 (mg/dL)    BUN 33 (*) 6 - 23 (mg/dL)    Creatinine, Ser 1.58 (*) 0.50 - 1.35 (mg/dL)    Calcium 9.2  8.4 - 10.5 (mg/dL)    GFR calc non Af Amer 41 (*) >90 (mL/min)    GFR calc Af Amer 48 (*) >90 (mL/min)   BASIC METABOLIC PANEL     Status: Abnormal   Collection Time   09/18/11  4:15 AM      Component Value Range Comment   Sodium 140  135 - 145 (mEq/L)    Potassium 4.8  3.5 - 5.1 (mEq/L)    Chloride 107  96 - 112 (mEq/L)    CO2 22  19 - 32 (mEq/L)  Glucose, Bld 107 (*) 70 - 99 (mg/dL)    BUN 32 (*) 6 - 23 (mg/dL)    Creatinine, Ser 1.48 (*) 0.50 - 1.35 (mg/dL)    Calcium 9.0  8.4 - 10.5 (mg/dL)    GFR calc non Af Amer 45 (*) >90 (mL/min)    GFR calc Af Amer 52 (*) >90 (mL/min)   PROTIME-INR     Status: Normal   Collection Time   09/18/11  4:15 AM      Component Value Range Comment   Prothrombin Time 14.3  11.6 - 15.2 (seconds)    INR 1.09  0.00 - 1.49    CBC     Status: Abnormal   Collection Time   09/18/11  4:15 AM      Component Value Range Comment   WBC 4.9  4.0 - 10.5 (K/uL)    RBC 4.19 (*) 4.22 - 5.81 (MIL/uL)    Hemoglobin 12.4 (*) 13.0 - 17.0 (g/dL)    HCT 37.1 (*) 39.0 - 52.0 (%)    MCV 88.5  78.0 - 100.0 (fL)    MCH 29.6  26.0 - 34.0 (pg)    MCHC 33.4  30.0 - 36.0 (g/dL)    RDW 13.8  11.5 - 15.5 (%)    Platelets 123 (*) 150 - 400 (K/uL)     Imaging: Imaging results have been reviewed  Assessment/Plan:   1. Active Problems: 2.  Dyspnea on exertion, anginal equivilant 3.  CAD (coronary artery disease), CABG 1996. 4.  HTN (hypertension),uncontrolled 5.  CKD (chronic kidney disease) stage 2, GFR 60-89 ml/min 6.  Aortic stenosis, moderate 7.  Pulmonary hypertension, mild 8.   Time Spent Directly with Patient:  20 minutes  Length of  Stay:  LOS: 2 days   Pt w/o symptoms. S/P cath with high grade calcified ostial RCA disease. Exam benign. SCr stable at 1.48. For HSRA PCI and stent today.   Lorretta Harp 09/18/2011, 8:12 AM

## 2011-09-19 ENCOUNTER — Other Ambulatory Visit: Payer: Self-pay

## 2011-09-19 LAB — BASIC METABOLIC PANEL
CO2: 26 mEq/L (ref 19–32)
Calcium: 8.9 mg/dL (ref 8.4–10.5)
Creatinine, Ser: 1.53 mg/dL — ABNORMAL HIGH (ref 0.50–1.35)
GFR calc non Af Amer: 43 mL/min — ABNORMAL LOW (ref >90)

## 2011-09-19 LAB — CBC
MCH: 30.2 pg (ref 26.0–34.0)
MCHC: 34.1 g/dL (ref 30.0–36.0)
MCV: 88.4 fL (ref 78.0–100.0)
Platelets: 121 10*3/uL — ABNORMAL LOW (ref 150–400)
RBC: 3.98 MIL/uL — ABNORMAL LOW (ref 4.22–5.81)

## 2011-09-19 MED ORDER — AMLODIPINE BESYLATE 10 MG PO TABS: 10.0000 mg | ORAL_TABLET | Freq: Every day | ORAL | Status: DC

## 2011-09-19 MED ORDER — SODIUM CHLORIDE 0.9 % IV SOLN
500.0000 mL | Freq: Once | INTRAVENOUS | Status: AC
  Administered 2011-09-19: 250 mL via INTRAVENOUS

## 2011-09-19 MED ORDER — TICAGRELOR 90 MG PO TABS: 90.0000 mg | ORAL_TABLET | Freq: Two times a day (BID) | ORAL | Status: DC

## 2011-09-19 MED ORDER — NITROGLYCERIN 0.4 MG SL SUBL: 0.4000 mg | SUBLINGUAL_TABLET | SUBLINGUAL | Status: DC | PRN

## 2011-09-19 MED ORDER — SODIUM CHLORIDE 0.45 % IV SOLN: INTRAVENOUS | Status: DC

## 2011-09-19 MED ORDER — ACETAMINOPHEN 325 MG PO TABS: 650.0000 mg | ORAL_TABLET | ORAL | Status: DC | PRN

## 2011-09-19 MED ORDER — ISOSORBIDE MONONITRATE ER 30 MG PO TB24: 30.0000 mg | ORAL_TABLET | Freq: Every day | ORAL | Status: DC

## 2011-09-19 MED ORDER — ATROPINE SULFATE 1 MG/ML IJ SOLN
INTRAMUSCULAR | Status: AC
  Filled 2011-09-19: qty 1

## 2011-09-19 MED ORDER — ATROPINE SULFATE 0.1 MG/ML IJ SOLN
0.5000 mg | Freq: Once | INTRAMUSCULAR | Status: AC
  Administered 2011-09-18: 0.5 mg via INTRAVENOUS

## 2011-09-19 MED FILL — Dextrose Inj 5%: INTRAVENOUS | Qty: 50 | Status: AC

## 2011-09-19 NOTE — Progress Notes (Signed)
Subjective:  No chest pain.  Objective:  Vital Signs in the last 24 hours: Temp:  [97.6 F (36.4 C)-97.9 F (36.6 C)] 97.6 F (36.4 C) (02/01 0807) Pulse Rate:  [51-73] 52  (02/01 0807) Resp:  [11-22] 16  (02/01 0807) BP: (81-169)/(50-88) 104/54 mmHg (02/01 0807) SpO2:  [96 %-99 %] 96 % (02/01 0807) Weight:  [85.5 kg (188 lb 7.9 oz)] 85.5 kg (188 lb 7.9 oz) (02/01 0445)  Intake/Output from previous day:  Intake/Output Summary (Last 24 hours) at 09/19/11 0945 Last data filed at 09/19/11 0518  Gross per 24 hour  Intake 2100.5 ml  Output   1500 ml  Net  600.5 ml    Physical Exam: General appearance: alert, cooperative and no distress Lungs: clear to auscultation bilaterally Heart: regular rate and rhythm Rt groin without hematoma   Rate: 50  Rhythm: sinus bradycardia  Lab Results:  Basename 09/19/11 0355 09/18/11 0415  WBC 8.1 4.9  HGB 12.0* 12.4*  PLT 121* 123*    Basename 09/19/11 0355 09/18/11 0415  NA 135 140  K 4.9 4.8  CL 103 107  CO2 26 22  GLUCOSE 111* 107*  BUN 21 32*  CREATININE 1.53* 1.48*   No results found for this basename: TROPONINI:2,CK,MB:2 in the last 72 hours Hepatic Function Panel No results found for this basename: PROT,ALBUMIN,AST,ALT,ALKPHOS,BILITOT,BILIDIR,IBILI in the last 72 hours No results found for this basename: CHOL in the last 72 hours  Basename 09/18/11 0415  INR 1.09    Imaging: No results found.  Cardiac Studies:  Assessment/Plan:   Active Problems:  RCA DES this admission  Dyspnea on exertion, anginal equivilant  CAD (coronary artery disease), CABG 1996.  HTN (hypertension),uncontrolled  CKD (chronic kidney disease) stage 2, GFR 60-89 ml/min  Aortic stenosis, moderate  Pulmonary hypertension, mild  Plan- Cr 1.53, will continue IVF this am. MD to see. F/U BMP next week.    Kerin Ransom PA-C 09/19/2011, 9:45 AM    Agree with note written by Kerin Ransom PAC  S/P HSRA PCI and STENT ostial RCA. Great  result. Pt pain free. Exam benign. Groin OK. Labs stabe. OK to D/C home today. ROV with Dr. Roxanne Gates J 09/19/2011 11:15 AM

## 2011-09-19 NOTE — Progress Notes (Signed)
Site area: right groin (arterial and venous)  Site Prior to Removal:  Level 0  Pressure Applied For 25 MINUTES    Minutes Beginning at 2346  Manual:   yes  Patient Status During Pull:  See comments  Post Pull Groin Site:  Level 0  Post Pull Instructions Given:  yes  Post Pull Pulses Present:  yes  Dressing Applied:  yes  Comments:  Pt experienced some bradycardia (HR in 30's) with low BP and was symptomatic (diaphoretic, nausea, clammy skin) pre-sheath pull.  Gave 0.5 mg atropine and gave a 50 mL bolus of NS.  HR and BP came back and pt stabilized, so the arterial sheath was pulled.  During the hold, the pts blood pressure dipped back down to the 99991111 systolic, so another 99991111 bolus was given, followed by a 142mL bolus.  VS and pt stabilized and pt is currently stable.  Will continue to assess.

## 2011-09-19 NOTE — Progress Notes (Signed)
CARDIAC REHAB PHASE I   PRE:  Rate/Rhythm: 68 SR  BP:  Supine:   Sitting: 130/70  Standing:    SaO2:   MODE:  Ambulation: 340 ft   POST:  Rate/Rhythem: 72 SR  BP:  Supine:   Sitting: 129/64  Standing:    SaO2:   0750 - 0900 Pt ambulated unit steady tolerated well. Education done with understanding. Permission for phase 2 given.    Deatra Ina

## 2011-09-19 NOTE — Progress Notes (Signed)
   CARE MANAGEMENT NOTE 09/19/2011  Patient:  [name], [name]   Account Number:  [id number]  Date Initiated:  09/19/2011  Documentation initiated by:  GRAVES-BIGELOW,Krishika Bugge  Subjective/Objective Assessment:   Pt s/p LHC. Plan for home on brilinta.     Action/Plan:   CM will provide pt with brilinta co pay card. Pt is eligible for 30 day free / copay card. Rx are written for 30 day free no refills and then the original rx.  CM did call [facility] to see if medicaiton available and it is- Pt aware.   Anticipated DC Date:  09/19/2011   Anticipated DC Plan:  Gross  CM consult      Choice offered to / List presented to:             Status of service:  Completed, signed off Medicare Important Message given?   (If response is "NO", the following Medicare IM given date fields will be blank) Date Medicare IM given:   Date Additional Medicare IM given:    Discharge Disposition:  HOME/SELF CARE  Per UR Regulation:    Comments:   09-19-11 [address], RN,BSN [phone] No other needs assessed by CM at this time. Benefits check in process for co pay amount.

## 2011-09-19 NOTE — Progress Notes (Signed)
Patient ID: [name],  MRN: [id number], DOB/AGE: 76/24/1935 76 y.o.  Admit date: 09/16/2011 Discharge date: 09/19/2011  Primary Care Provider: Dr [name] Primary Cardiologist: Dr Debara Pickett  Discharge Diagnoses  Active Problems:  Dyspnea on exertion, anginal equivilant, cath RCA DES this admission.  CAD (coronary artery disease), CABG 1996.  HTN (hypertension),uncontrolled  CKD (chronic kidney disease) stage 2, GFR 60-89 ml/min. Cr 1.5 at D/C, Cozaar stopped.  Aortic stenosis, moderate  Pulmonary hypertension, mild  Dyslipidemia  Procedures: Cardiac cath/ RCA DES 09/18/11   Hospital Course: 78 y.o. male with a history of coronary disease status post three-vessel CABG with LIMA to LAD, SVG to OM1, SVG to OM 2 in the late 1990s. He underwent stress testing in February 2012 which showed no ischemia. He does have a history of aortic stenosis which is mild to moderate by echo with a valve area 1.5-1.6 cm2 and mild pulmonary hypertension. He reports recent decrease in exercise tolerance, shortness of breath and symptoms similar to prior to his bypass. He was admitted for diagnostic cath. He has CRI and his Cozaar was stopped. Cath done 09/16/11 showed :   Coronary Angiographic Data:  Left Main: Moderate distal tapering disease up to 50%  Left Anterior Descending (LAD): Occluded proximally and a previously placed stent  1st diagonal (D1): Small vessel less than 2 mm  Circumflex (LCx): Subtotally occluded at the ostium and totally occluded at the ostium of a proximally placed stent.  1st obtuse marginal: Small vessel which is bypassed distally  2nd obtuse marginal: Larger vessel which is bypassed distally  Right Coronary Artery: Dominant vessel. There is severe 90% calcified ostial stenosis with no reflux using a 5 French catheter. There is no significant change with intracoronary nitroglycerin. There is a moderate mid vessel disease.  right ventricle branch of right coronary artery: No significant    posterior descending artery: Mild luminal irregularities Grafts  LIMA - LAD: Patent with good distal runoff  SVG - OM1: Patent with TIMI-3 flow and good distal runoff  SVG - OM2: Patent with TIMI-3 flow and good distal runoff He tolorated the procedure well. His Cr is 1.5, he was hydrated and we feel he can be discharged later today. He will have a BMP next week.     Discharge Vitals:  Blood pressure 104/54, pulse 52, temperature 97.6 F (36.4 C), temperature source Oral, resp. rate 16, height 5' 9.5" (1.765 m), weight 85.5 kg (188 lb 7.9 oz), SpO2 96.00%.    Labs: Results for orders placed during the hospital encounter of 09/16/11 (from the past 48 hour(s))  BASIC METABOLIC PANEL     Status: Abnormal   Collection Time   09/18/11  4:15 AM      Component Value Range Comment   Sodium 140  135 - 145 (mEq/L)    Potassium 4.8  3.5 - 5.1 (mEq/L)    Chloride 107  96 - 112 (mEq/L)    CO2 22  19 - 32 (mEq/L)    Glucose, Bld 107 (*) 70 - 99 (mg/dL)    BUN 32 (*) 6 - 23 (mg/dL)    Creatinine, Ser 1.48 (*) 0.50 - 1.35 (mg/dL)    Calcium 9.0  8.4 - 10.5 (mg/dL)    GFR calc non Af Amer 45 (*) >90 (mL/min)    GFR calc Af Amer 52 (*) >90 (mL/min)   PROTIME-INR     Status: Normal   Collection Time   09/18/11  4:15 AM  Component Value Range Comment   Prothrombin Time 14.3  11.6 - 15.2 (seconds)    INR 1.09  0.00 - 1.49    CBC     Status: Abnormal   Collection Time   09/18/11  4:15 AM      Component Value Range Comment   WBC 4.9  4.0 - 10.5 (K/uL)    RBC 4.19 (*) 4.22 - 5.81 (MIL/uL)    Hemoglobin 12.4 (*) 13.0 - 17.0 (g/dL)    HCT 37.1 (*) 39.0 - 52.0 (%)    MCV 88.5  78.0 - 100.0 (fL)    MCH 29.6  26.0 - 34.0 (pg)    MCHC 33.4  30.0 - 36.0 (g/dL)    RDW 13.8  11.5 - 15.5 (%)    Platelets 123 (*) 150 - 400 (K/uL)   POCT ACTIVATED CLOTTING TIME     Status: Normal   Collection Time   09/18/11  3:19 PM      Component Value Range Comment   Activated Clotting Time 496     CBC      Status: Abnormal   Collection Time   09/19/11  3:55 AM      Component Value Range Comment   WBC 8.1  4.0 - 10.5 (K/uL)    RBC 3.98 (*) 4.22 - 5.81 (MIL/uL)    Hemoglobin 12.0 (*) 13.0 - 17.0 (g/dL)    HCT 35.2 (*) 39.0 - 52.0 (%)    MCV 88.4  78.0 - 100.0 (fL)    MCH 30.2  26.0 - 34.0 (pg)    MCHC 34.1  30.0 - 36.0 (g/dL)    RDW 13.7  11.5 - 15.5 (%)    Platelets 121 (*) 150 - 400 (K/uL)   BASIC METABOLIC PANEL     Status: Abnormal   Collection Time   09/19/11  3:55 AM      Component Value Range Comment   Sodium 135  135 - 145 (mEq/L)    Potassium 4.9  3.5 - 5.1 (mEq/L)    Chloride 103  96 - 112 (mEq/L)    CO2 26  19 - 32 (mEq/L)    Glucose, Bld 111 (*) 70 - 99 (mg/dL)    BUN 21  6 - 23 (mg/dL) DELTA CHECK NOTED   Creatinine, Ser 1.53 (*) 0.50 - 1.35 (mg/dL)    Calcium 8.9  8.4 - 10.5 (mg/dL)    GFR calc non Af Amer 43 (*) >90 (mL/min)    GFR calc Af Amer 50 (*) >90 (mL/min)     Disposition:  Follow-up Information    Follow up with Pixie Casino, MD on 10/01/2011. (8:45)    Contact information:   [place] [phone]          Discharge Medications:  Medication List  As of 09/19/2011 10:10 AM   STOP taking these medications         losartan-hydrochlorothiazide 100-25 MG per tablet         TAKE these medications         acetaminophen 325 MG tablet   Commonly known as: TYLENOL   Take 2 tablets (650 mg total) by mouth every 4 (four) hours as needed.      amLODipine 10 MG tablet   Commonly known as: NORVASC   Take 1 tablet (10 mg total) by mouth daily.      aspirin EC 81 MG tablet   Take 81 mg by mouth daily.  ezetimibe 10 MG tablet   Commonly known as: ZETIA   Take 10 mg by mouth daily.      fish oil-omega-3 fatty acids 1000 MG capsule   Take 1 g by mouth 3 (three) times daily.      isosorbide mononitrate 30 MG 24 hr tablet   Commonly known as: IMDUR   Take 1 tablet (30 mg total) by mouth daily.       metoprolol tartrate 25 MG tablet   Commonly known as: LOPRESSOR   Take 25 mg by mouth 2 (two) times daily.      multivitamins ther. w/minerals Tabs   Take 1 tablet by mouth daily.      niacin 500 MG CR tablet   Commonly known as: NIASPAN   Take 500 mg by mouth at bedtime.      nitroGLYCERIN 0.4 MG SL tablet   Commonly known as: NITROSTAT   Place 1 tablet (0.4 mg total) under the tongue every 5 (five) minutes as needed for chest pain.      rosuvastatin 20 MG tablet   Commonly known as: CRESTOR   Take 20 mg by mouth daily.      Ticagrelor 90 MG Tabs tablet   Commonly known as: BRILINTA   Take 1 tablet (90 mg total) by mouth 2 (two) times daily.            Outstanding Labs/Studies  Duration of Discharge Encounter: Greater than 30 minutes including physician time.  Angelena Form PA-C 09/19/2011 10:10 AM  Agree with note written by Kerin Ransom [facility]  OK for DC today. ROV with Dr. Roxanne Gates J 09/19/2011 11:16 AM

## 2011-09-19 NOTE — Cardiovascular Report (Signed)
NAMEJENS, RENARD NO.:  [id number]  MEDICAL RECORD NO.:  [id number]  LOCATION:  2508                         FACILITY:  [facility]  PHYSICIAN:  Shelva Majestic, M.D.     DATE OF BIRTH:  1933-10-31  DATE OF PROCEDURE:  09/18/2011 DATE OF DISCHARGE:                           CARDIAC CATHETERIZATION   INDICATIONS:  Mr. Hollister Beazley is a 78 year old gentleman who has a remote history of CABG with a LIMA to the LAD, a vein to the OM-1, and a vein to the OM-2 vessel.  The patient also has been found to have moderate aortic stenosis.  He does also have mild renal insufficiency.  Recently, the patient reported decrease in exercise tolerance, shortness of breath, and some chest discomfort similar to bypass surgery.  On September 15, 2001, he underwent right and left heart catheterization by Dr. Debara Pickett.  He was found to have patent grafts, but with severe 90% very calcified ostial right coronary artery stenosis in this unbypassed right coronary artery.  The patient had a patent LIMA to the LAD and patent veins to both marginal vessels.  The patient has been hydrated following his diagnostic procedure.  He now is referred to me to undergo complex high-speed rotational atherectomy with a very calcified ostial RCA stenosis.  PROCEDURE:  After premedication with Versed 2 mg plus fentanyl 25 mcg, the patient was prepped and draped in usual fashion.  His right femoral artery was punctured anteriorly and a 7-French sheath was inserted. Right femoral vein was punctured and a venous sheath was inserted. Transvenous pacemaker was advanced to the RV apex.  Bivalirudin was administered for anticoagulation.  A 7-French right guide with side holes was used for the guiding catheter.  The patient received 100 mg of Brilinta, platelet therapy.  Scout angiography confirmed very calcified ostial 90- 95% proximal RCA stenosis and a large dominant right coronary artery system.  There was mild disease  distally.  Following documentation of therapeutic anticoagulation, a Roto floppy wire was advanced down the right coronary artery.  Due to the ostial nature of the stenosis and high-grade stenosis, initially a 1.25 mm bur was utilized.  This ultimately with some difficulty was able to cross the stenosis and at least 3 runs were made.  Using Dyna Glide technique, this was then removed and a 1.5 mm bur was then inserted.  Four additional runs were made with significant calcium debulking.  At this point, it appeared that significant calcium had been removed. Consequently, a 3.0 x 12 mm Emerge balloon was then used for low level post high-speed rotational arthrectomy dilatation.  A 3.0 x 12 mm DES PROMUS Element stent was then inserted with careful attention paid to make certain the ostium was covered.  Post stent dilatation was done utilizing a 3.25 x 12 mm noncompliant Quantum Apex balloon.  Dilatation was also done at the ostium with some of the balloon hanging into the AO.  Scout angiography confirmed an excellent angiographic result.  The patient tolerated the procedure well and returned to his room in satisfactory condition.  HEMODYNAMIC DATA:  Central aortic pressure is 108/63.  ANGIOGRAPHIC DATA:  At the start of the  interventional procedure, the right coronary artery has significant ostial calcification.  There was 90-95% ostial stenosis extending proximally.  The RCA had mild luminal irregularities and gave rise to a PDA, 2 inferior LV branches and posterolateral vessel.  There are 30% narrowing in the inferior LV branches, 50% narrowing in the PLA.  Following complex coronary intervention with high-speed rotational atherectomy, followed by PTCA, followed by drug-eluting stenting, followed by post stent dilatation with use of the temporary pacemaker during the rotablations, the 90-95% ostial stenosis was reduced to 0%.  There was brisk TIMI-3 flow.  There is no change in distal  vessels.  The patient tolerated procedure well.  IMPRESSION:  Complex percutaneous coronary intervention involving a very calcified 90-95% ostial right coronary artery stenosis with high-speed rotational arthrectomy/PTCA/3.0 x 12 mm DES PROMUS Element stenting and poststent dilatation up to 3.26 mm with 90-95% stenosis being reduced to 0%. Bivalirudin/Brilinta/aspirin/intracoronary nitroglycerin.  The patient will be hydrated aggressively post procedure.  He tolerated the procedure well.          ______________________________ Shelva Majestic, M.D.     TK/MEDQ  D:  09/18/2011  T:  09/19/2011  Job:  [id number]  cc:   Grace Blight

## 2011-09-23 DIAGNOSIS — I2510 Atherosclerotic heart disease of native coronary artery without angina pectoris: Secondary | ICD-10-CM

## 2011-09-23 HISTORY — DX: Atherosclerotic heart disease of native coronary artery without angina pectoris: I25.10

## 2011-12-19 ENCOUNTER — Encounter (HOSPITAL_COMMUNITY): Payer: Self-pay | Admitting: Pharmacy Technician

## 2011-12-19 ENCOUNTER — Other Ambulatory Visit: Payer: Self-pay | Admitting: Internal Medicine

## 2011-12-22 ENCOUNTER — Ambulatory Visit (HOSPITAL_COMMUNITY)
Admission: RE | Admit: 2011-12-22 | Discharge: 2011-12-23 | Disposition: A | Discharge status: Home / Self Care | Payer: Medicare Other | Source: Ambulatory Visit | Attending: Internal Medicine | Admitting: Internal Medicine

## 2011-12-22 ENCOUNTER — Encounter (HOSPITAL_COMMUNITY): Payer: Self-pay | Admitting: Cardiology

## 2011-12-22 ENCOUNTER — Encounter (HOSPITAL_COMMUNITY)
Admission: RE | Disposition: A | Discharge status: Home / Self Care | Payer: Self-pay | Source: Ambulatory Visit | Attending: Internal Medicine

## 2011-12-22 DIAGNOSIS — R0609 Other forms of dyspnea: Secondary | ICD-10-CM | POA: Insufficient documentation

## 2011-12-22 DIAGNOSIS — I208 Other forms of angina pectoris: Secondary | ICD-10-CM | POA: Diagnosis present

## 2011-12-22 DIAGNOSIS — R9439 Abnormal result of other cardiovascular function study: Secondary | ICD-10-CM | POA: Diagnosis present

## 2011-12-22 DIAGNOSIS — Y831 Surgical operation with implant of artificial internal device as the cause of abnormal reaction of the patient, or of later complication, without mention of misadventure at the time of the procedure: Secondary | ICD-10-CM | POA: Insufficient documentation

## 2011-12-22 DIAGNOSIS — I10 Essential (primary) hypertension: Secondary | ICD-10-CM | POA: Diagnosis present

## 2011-12-22 DIAGNOSIS — T82897A Other specified complication of cardiac prosthetic devices, implants and grafts, initial encounter: Secondary | ICD-10-CM | POA: Insufficient documentation

## 2011-12-22 DIAGNOSIS — I350 Nonrheumatic aortic (valve) stenosis: Secondary | ICD-10-CM | POA: Diagnosis present

## 2011-12-22 DIAGNOSIS — Z9861 Coronary angioplasty status: Secondary | ICD-10-CM

## 2011-12-22 DIAGNOSIS — I129 Hypertensive chronic kidney disease with stage 1 through stage 4 chronic kidney disease, or unspecified chronic kidney disease: Secondary | ICD-10-CM | POA: Insufficient documentation

## 2011-12-22 DIAGNOSIS — I209 Angina pectoris, unspecified: Secondary | ICD-10-CM | POA: Insufficient documentation

## 2011-12-22 DIAGNOSIS — N183 Chronic kidney disease, stage 3 unspecified: Secondary | ICD-10-CM | POA: Insufficient documentation

## 2011-12-22 DIAGNOSIS — I2089 Other forms of angina pectoris: Secondary | ICD-10-CM | POA: Diagnosis present

## 2011-12-22 DIAGNOSIS — I2510 Atherosclerotic heart disease of native coronary artery without angina pectoris: Secondary | ICD-10-CM | POA: Insufficient documentation

## 2011-12-22 DIAGNOSIS — R0989 Other specified symptoms and signs involving the circulatory and respiratory systems: Secondary | ICD-10-CM | POA: Insufficient documentation

## 2011-12-22 DIAGNOSIS — Z9889 Other specified postprocedural states: Secondary | ICD-10-CM | POA: Diagnosis not present

## 2011-12-22 DIAGNOSIS — I272 Pulmonary hypertension, unspecified: Secondary | ICD-10-CM | POA: Diagnosis present

## 2011-12-22 DIAGNOSIS — I359 Nonrheumatic aortic valve disorder, unspecified: Secondary | ICD-10-CM | POA: Insufficient documentation

## 2011-12-22 DIAGNOSIS — I2789 Other specified pulmonary heart diseases: Secondary | ICD-10-CM | POA: Insufficient documentation

## 2011-12-22 HISTORY — PX: FRACTIONAL FLOW RESERVE WIRE: SHX5839

## 2011-12-22 HISTORY — DX: Peripheral vascular disease, unspecified: I73.9

## 2011-12-22 HISTORY — PX: LEFT HEART CATHETERIZATION WITH CORONARY/GRAFT ANGIOGRAM: SHX5450

## 2011-12-22 HISTORY — PX: CARDIAC CATHETERIZATION: SHX172

## 2011-12-22 LAB — CBC
Platelets: 146 10*3/uL — ABNORMAL LOW (ref 150–400)
RDW: 15.3 % (ref 11.5–15.5)
WBC: 5.1 10*3/uL (ref 4.0–10.5)

## 2011-12-22 LAB — PROTIME-INR: INR: 1.06 (ref 0.00–1.49)

## 2011-12-22 LAB — BASIC METABOLIC PANEL
Calcium: 8.9 mg/dL (ref 8.4–10.5)
Chloride: 108 mEq/L (ref 96–112)
Creatinine, Ser: 1.95 mg/dL — ABNORMAL HIGH (ref 0.50–1.35)
GFR calc Af Amer: 37 mL/min — ABNORMAL LOW (ref >90)

## 2011-12-22 LAB — POCT ACTIVATED CLOTTING TIME: Activated Clotting Time: 243 seconds

## 2011-12-22 SURGERY — LEFT HEART CATHETERIZATION WITH CORONARY/GRAFT ANGIOGRAM
Anesthesia: LOCAL | Site: Groin | Laterality: Right

## 2011-12-22 MED ORDER — MORPHINE SULFATE 2 MG/ML IJ SOLN: 1.0000 mg | INTRAMUSCULAR | Status: DC | PRN

## 2011-12-22 MED ORDER — ATORVASTATIN CALCIUM 40 MG PO TABS
40.0000 mg | ORAL_TABLET | Freq: Every day | ORAL | Status: DC
  Administered 2011-12-22: 40 mg via ORAL
  Filled 2011-12-22 (×2): qty 1

## 2011-12-22 MED ORDER — SODIUM CHLORIDE 0.9 % IV SOLN
INTRAVENOUS | Status: DC
  Administered 2011-12-22: 08:00:00 via INTRAVENOUS

## 2011-12-22 MED ORDER — NITROGLYCERIN 0.4 MG SL SUBL: 0.4000 mg | SUBLINGUAL_TABLET | SUBLINGUAL | Status: DC | PRN

## 2011-12-22 MED ORDER — EZETIMIBE 10 MG PO TABS
10.0000 mg | ORAL_TABLET | Freq: Every day | ORAL | Status: DC
  Administered 2011-12-22 – 2011-12-23 (×2): 10 mg via ORAL
  Filled 2011-12-22 (×3): qty 1

## 2011-12-22 MED ORDER — NIACIN ER (ANTIHYPERLIPIDEMIC) 500 MG PO TBCR
500.0000 mg | EXTENDED_RELEASE_TABLET | Freq: Every day | ORAL | Status: DC
  Administered 2011-12-22: 500 mg via ORAL
  Filled 2011-12-22 (×2): qty 1

## 2011-12-22 MED ORDER — ACETAMINOPHEN 325 MG PO TABS: 650.0000 mg | ORAL_TABLET | ORAL | Status: DC | PRN

## 2011-12-22 MED ORDER — HEPARIN SODIUM (PORCINE) 1000 UNIT/ML IJ SOLN
INTRAMUSCULAR | Status: AC
  Filled 2011-12-22: qty 1

## 2011-12-22 MED ORDER — LIDOCAINE HCL (PF) 1 % IJ SOLN
INTRAMUSCULAR | Status: AC
  Filled 2011-12-22: qty 30

## 2011-12-22 MED ORDER — ONDANSETRON HCL 4 MG/2ML IJ SOLN: 4.0000 mg | Freq: Four times a day (QID) | INTRAMUSCULAR | Status: DC | PRN

## 2011-12-22 MED ORDER — MIDAZOLAM HCL 2 MG/2ML IJ SOLN
INTRAMUSCULAR | Status: AC
  Filled 2011-12-22: qty 2

## 2011-12-22 MED ORDER — SODIUM CHLORIDE 0.9 % IJ SOLN: 3.0000 mL | INTRAMUSCULAR | Status: DC | PRN

## 2011-12-22 MED ORDER — METOPROLOL TARTRATE 25 MG PO TABS
25.0000 mg | ORAL_TABLET | Freq: Two times a day (BID) | ORAL | Status: DC
  Administered 2011-12-22 – 2011-12-23 (×2): 25 mg via ORAL
  Filled 2011-12-22 (×3): qty 1

## 2011-12-22 MED ORDER — HEPARIN (PORCINE) IN NACL 2-0.9 UNIT/ML-% IJ SOLN
INTRAMUSCULAR | Status: AC
  Filled 2011-12-22: qty 2000

## 2011-12-22 MED ORDER — ADENOSINE 12 MG/4ML IV SOLN
16.0000 mL | Freq: Once | INTRAVENOUS | Status: DC
  Administered 2011-12-22: 48 mg via INTRAVENOUS
  Filled 2011-12-22: qty 16

## 2011-12-22 MED ORDER — ISOSORBIDE MONONITRATE ER 30 MG PO TB24
30.0000 mg | ORAL_TABLET | Freq: Every day | ORAL | Status: DC
  Administered 2011-12-22 – 2011-12-23 (×2): 30 mg via ORAL
  Filled 2011-12-22 (×3): qty 1

## 2011-12-22 MED ORDER — SODIUM CHLORIDE 0.9 % IV SOLN: 250.0000 mL | INTRAVENOUS | Status: DC

## 2011-12-22 MED ORDER — ASPIRIN EC 81 MG PO TBEC
81.0000 mg | DELAYED_RELEASE_TABLET | Freq: Every day | ORAL | Status: DC
  Administered 2011-12-22: 81 mg via ORAL
  Filled 2011-12-22 (×3): qty 1

## 2011-12-22 MED ORDER — TICAGRELOR 90 MG PO TABS
90.0000 mg | ORAL_TABLET | Freq: Two times a day (BID) | ORAL | Status: DC
  Administered 2011-12-22 – 2011-12-23 (×2): 90 mg via ORAL
  Filled 2011-12-22 (×3): qty 1

## 2011-12-22 MED ORDER — SODIUM CHLORIDE 0.9 % IV SOLN
INTRAVENOUS | Status: AC
  Administered 2011-12-22: 13:00:00 via INTRAVENOUS

## 2011-12-22 MED ORDER — NITROGLYCERIN 0.2 MG/ML ON CALL CATH LAB
INTRAVENOUS | Status: AC
  Filled 2011-12-22: qty 1

## 2011-12-22 MED ORDER — FENTANYL CITRATE 0.05 MG/ML IJ SOLN
INTRAMUSCULAR | Status: AC
  Filled 2011-12-22: qty 2

## 2011-12-22 MED ORDER — SODIUM CHLORIDE 0.9 % IJ SOLN: 3.0000 mL | Freq: Two times a day (BID) | INTRAMUSCULAR | Status: DC

## 2011-12-22 NOTE — CV Procedure (Signed)
NAME:  TRONG EIDAM   MRN: [id number] DOB:  [birth year]    ADMIT DATE: 12/22/2011  FFR - PTCA NOTE  PATIENT:  [name] is a 78 y.o. male followed by Dr. Debara Pickett.  He is s/p CABG with LIMA-LAD, SVG-OM1 & SVG-OM2 with recent ostial RCA Rotablotion with Promus DES 3.0 mm x 12 mm placed by Dr. Claiborne Billings in Jan 2013.  He initially did well, but presented to Dr. Debara Pickett with progressive shortness of breath concerning as his previous anginal equivalent. A nuclear study was performed which demonstrated inferolateral reversibility. Based on this he is referred for cardiac catheterization that demonstrated instent re-stenosis of the ostial RCA stent with catheter damping and bradycardia.  After reviewing the films, we decided to perform an FFR measurement of this lesion and PTCA if significant as there is no other lesion to explain the inferolateral ischemia in a symptomatic patient.  SURGEON:  Leonie Man  PRE-OPERATIVE DIAGNOSIS:    Dyspnea on Exertion  Abnormal Nuclear Stress Test  ~60-70% Ostial RCA in-stent Restenosis - FFR ~0.8  POST-OPERATIVE DIAGNOSIS:    70% ostial RCA ISR with rapid drop in FFR to ~0.82 (but with significant hypotension and bradycardia -- thought to be significant after discussion with Dr. Debara Pickett  Successful Cutting Balloon Atherectomy PTCA of the Ostial RCA lesion 70% to ~10% ISR - final diameter   PROCEDURE PERFORMED:    Fractional Flow Reserve Measurement of the Ostial RCA ISR  Cutting balloon atherectomy / PTCA of the ostial RCA   ANESTHESIA:   Local Lidocaine ~5 ml; and moderate sedation; no additional sedation was given  EBL:   <48ml  PROCEDURE:  After the patient was re-prepped and draped according to standard protocol.  The 5Fr Sheath was exchanged over a wire for a 6Fr sheath; new sterile were drapes placed and gloves donned. A weight based bolus of IV Heparin 5000 Units bolus a second ~1000 Unit bolus was administered.  He is on standing Brilinta.   A 6Fr  NoTorque Guide catheter was advanced over a J-wire and used to engage the right Coronary Artery resulting in pressure damping.  An ACT of > 200 Sec was confirmed prior to advancing the [facility] Wire. The FFR wire was normalized in the Aorta, then passed through the ostial stent into the distal RCA. IV Adenosine was administered for 2 minutes with a precipitous drop in ratio over the last ~45 seconds associated with a drop in BP to 40s and HR to 30s.  The Final FFR Ratio was 0.82, but at least briefly was as low as 0.80. After discussion with Dr. Debara Pickett, we felt that although not technically "significant", the pressure drop and bradycardia were of enough concern to consider the number to be physiologically significant -- especially in light of abnormal Myoview Stress Test.  Lesion #1:  Ostial RCA   Pre-PCI Stenosis: 70 % Post-PCI Stenosis: 10 %     TIMI 3 flow       TIMI 3 flow  FFR wire -- Ratio 0.80-0.82 but physiologically significant.  Flextome Cutting Balloon: 3.25 mm x 10 mm   1st Inflation:  10 Atm for 60 Sec   2nd Inflation: 82 Atm for 90 Sec - final estimated diameter of ~3.4 mm  Scout angiography with and without wire in place in multiple views revealed near resolution of the ostial stenosis and did not reveal evidence of dissection or perforation.    PLAN OF CARE:   Monitor overnight with aggressive hydration  and anticipate d/c in AM.  Will hold antihypertensives in light of intra-cath hypotension  Continue wit DAPT as ordered.  PATIENT DISPOSITION:  Post-procedure Stepdown unit, 2500; Stable  Thirza Pellicano W, M.D., M.S. [facility] [address] [place], [place]  [address]  [phone]  12/22/2011 11:31 AM

## 2011-12-22 NOTE — Op Note (Signed)
[name]     CARDIAC CATHETERIZATION REPORT  [name]   [id number] 04/28/1934  Performing Cardiologist: Pixie Casino Primary Physician: No primary provider on file. Primary Cardiologist:  Dr. Debara Pickett  Procedures Performed:  Left Heart Catheterization via 5 Fr right femoral artery access  Indication(s): angina, abnormal nuclear stress test  History: 78 y.o. male with a history of CABG with LIMA->LAD and SVG to OM1 and SVG to OM2. Recently he presented with increasing dyspnea and was found to have ostial stenosis of the native RCA. He underwent rotablation and PCI with a Promus 3.0 DES in 08/2011. Subsequently he did well for a few months, but then has had progressive shortness of breath. A nuclear study was performed which demonstrated inferolateral reversibility. Based on this he is referred for cardiac cath.  Consent: The procedure with Risks/Benefits/Alternatives and Indications was reviewed with the patient and family.  All questions were answered.    Risks / Complications include, but not limited to: Death, MI, CVA/TIA, VF/VT (with defibrillation), Bradycardia (need for temporary pacer placement), contrast induced nephropathy, bleeding / bruising / hematoma / pseudoaneurysm, vascular or coronary injury (with possible emergent CT or Vascular Surgery), adverse medication reactions, infection.    The patient voices understanding and agree to proceed.    Risks of procedure as well as the alternatives and risks of each were explained to the (patient/caregiver).  Consent for procedure obtained. Consent for signed by MD and patient with RN witness -- placed on chart.  Procedure: The patient was brought to the [facility] in the fasting state and prepped and draped in the usual sterile fashion for (Right groin access.   Sterile technique was used including antiseptics, cap, gloves, gown, hand hygiene, mask and  sheet.  Skin prep: Chlorhexidine;  Time Out: Verified patient identification, verified procedure, site/side was marked, verified correct patient position, special equipment/implants available, medications/allergies/relevent history reviewed, required imaging and test results available.  Performed  The right femoral head was identified using tactile and fluoroscopic technique.  The right groin was anesthetized with 1% subcutaneous Lidocaine.  The right Common Femoral Artery was accessed using the Modified Seldinger Technique with placement of a antimicrobial bonded/coated single lumen (5 Fr) sheath was placed using the Seldinger technique.  The sheath was aspirated and flushed.  A 5 Fr JL4 Catheter was advanced of over a  Standard J wire into the ascending Aorta.  The catheter was used to engage the left coronary artery.  Multiple cineangiographic views of the left coronary artery system(s) were performed.  A 5 Fr JR4 Catheter was advanced of over a Safety J wire into the ascending Aorta.  The catheter was used to engage the right coronary artery.  Multiple cineangiographic views of the right coronary artery system(s) were performed. This catheter was then exchanged over the Standard J wire for an angled Pigtail catheter that was advanced across the Aortic Valve.  LV hemodynamics were measured.  LV hemodynamics were then re-sampled, and the catheter was pulled back across the Aortic Valve for measurement of "pull-back" gradient.  The catheter and the wire was removed completely out of the body.  After discussion with Dr. Ellyn Hack, there is concern for possible ostial in-stent stenosis. Will proceed with FFR evaluation.  The patient  was stable before, during and following the procedure.   Patient did tolerate procedure well. There were not complications. EBL: <10 cc  Medications:  Sedation:  1 mg IV Versed, 25  IV mcg Fentanyl  Contrast:  60 cc Omnipaque   Hemodynamics:  Central Aortic Pressure /  Mean Aortic Pressure: 81/46  LV Pressure / LV End diastolic Pressure:  5  Coronary Angiographic Data:  Left Main:  Diffuse tapering 50-60% stenosis  Left Anterior Descending (LAD):  Occluded proximally at a previously placed stent  Circumflex (LCx):  Occluded proximally at a previously placed stent.  1st obtuse marginal:  Bypassed distally.  2nd obtuse marginal:  Bypassed distally.  Right Coronary Artery: Ostial RCA stent with dampening of the 35F RCA and Williams right catheters.  right ventricle branch of right coronary artery: Mild to moderate disease.  posterior descending artery: Small caliber vessel with lateral course and with mild disease.  posterior lateral branch:  Large territory to the inferolateral wall.  Grafts  LIMA - LAD: Patent with good distal runoff.  SVG - OM1: Patent with good distal runoff and TIMI III flow.  SVG - OM2: Patent with good distal runoff and TIMI III flow.  Impression: 1.  Possibly significant ostial RCA in-stent stenosis. Dampening with 2 different 5 french catheters and little reflux. 2.  Abnormal nuclear stress test with reversible inferolateral ischemia. 3.  Crescendo dyspnea symptoms that are similar to symptoms before his RCA stent.  Plan: 1.  Discussed case with Dr. Ellyn Hack, will plan FFR of the ostial RCA with guide not engaged. 2.  If FFR is positive, will likely need balloon dilatation of the ostium.  The case and results was discussed with the patient. The case and results was not discussed with the patient's PCP. The case and results was discussed with the patient's Cardiologist.  Time Spend Directly with Patient:  41 minutes  Pixie Casino, MD, [facility] Attending Cardiologist [facility] [place] [facility] 12/22/2011, 10:10 AM

## 2011-12-22 NOTE — H&P (Signed)
     [facility]          INTERVAL PROCEDURE H&P   History and Physical Interval Note:  12/22/2011 8:48 AM  [name] has presented today for their planned procedure. The various methods of treatment have been discussed with the patient and family. After consideration of risks, benefits and other options for treatment, the patient has consented to the procedure.  The patients' outpatient history has been reviewed, patient examined, and no change in status from most recent office note within the past 30 days. I have reviewed the patients' chart and labs and will proceed as planned. Questions were answered to the patient's satisfaction.   Pixie Casino, MD, [facility] Attending Cardiologist [facility] [place] [facility] 12/22/2011, 8:48 AM

## 2011-12-23 ENCOUNTER — Encounter (HOSPITAL_COMMUNITY): Payer: Self-pay | Admitting: Cardiology

## 2011-12-23 DIAGNOSIS — I779 Disorder of arteries and arterioles, unspecified: Secondary | ICD-10-CM

## 2011-12-23 HISTORY — DX: Disorder of arteries and arterioles, unspecified: I77.9

## 2011-12-23 LAB — CBC
MCV: 87.3 fL (ref 78.0–100.0)
Platelets: 123 10*3/uL — ABNORMAL LOW (ref 150–400)
RDW: 15.5 % (ref 11.5–15.5)
WBC: 5.2 10*3/uL (ref 4.0–10.5)

## 2011-12-23 LAB — BASIC METABOLIC PANEL
Calcium: 8.6 mg/dL (ref 8.4–10.5)
Chloride: 110 mEq/L (ref 96–112)
Creatinine, Ser: 1.88 mg/dL — ABNORMAL HIGH (ref 0.50–1.35)
GFR calc Af Amer: 39 mL/min — ABNORMAL LOW (ref >90)

## 2011-12-23 NOTE — Discharge Summary (Signed)
Physician Discharge Summary  Patient ID: MITCHUM URDANETA MRN: [id number] DOB/AGE: [birth year] 78 y.o.  Admit date: 12/22/2011 Discharge date: 12/23/2011  Discharge Diagnoses:  Principal Problem:  *Abnormal cardiovascular stress test, 11/2011 wint inferolateral reversibility Active Problems:  Dyspnea on exertion, anginal equivilant  CAD (coronary artery disease), CABG 1996. Promus DES to ostial RCA, 08/2011  Recurrent angina status post rotational atherectomy, with PTCA for ISR to RCA, 12/22/2011  CKD (chronic kidney disease) stage 3, GFR 30-59 ml/min  HTN (hypertension),  Aortic stenosis, moderate  Pulmonary hypertension, mild  Carotid artery disease, Rt. CEA, 08/04/07, Lt. CEA 04/26/07.   Discharged Condition: good  Procedures: 12/22/11  Combined Lt. Heart cath by Dr. Debara Pickett. 12/22/11 FFR and then Successful Cutting Balloon Atherectomy PTCA of the Ostial RCA lesion 70% to ~10% ISR - by Dr. Ellyn Hack.    Hospital Course: PATIENT: [name] is a 78 y.o. male followed by Dr. Debara Pickett. He is s/p CABG with LIMA-LAD, SVG-OM1 & SVG-OM2 with recent ostial RCA Rotablotion with Promus DES 3.0 mm x 12 mm placed by Dr. Claiborne Billings in Jan 2013. He initially did well, but presented to Dr. Debara Pickett with progressive shortness of breath concerning as his previous anginal equivalent. A nuclear study was performed which demonstrated inferolateral reversibility. Based on this he is referred for cardiac catheterization that demonstrated instent re-stenosis of the ostial RCA stent with catheter damping and bradycardia.  After reviewing the films, we decided to perform an FFR measurement of this lesion and PTCA if significant as there is no other lesion to explain the inferolateral ischemia in a symptomatic patient. FFR  0.8 and pt underwent successful cutting balloon atherectomy PTCA on the Ostial RCA lesion for in-stent restenosis.   Pt tolerated the procedure without complications.  On the morning of 12/23/11 pt. Was stable ambulated  without complaints.  His DOE was resolved.  On exam Murmur of aortic stenosis is present.  Also soft carotid bruit with history of Bilateral CEAs.  Pt. Was agreeable to Phase 2 cardiac rehab.   He was seen by Dr. Gwenlyn Found and found to be stable with plans to discharge.  He did have short burst of NSVT during the night.   Continue beta blocker.  Will need outpatient CBC and BMP for anemia and chronic kidney disease with new load of dye.   Consults: None  Significant Diagnostic Studies:  Na 139 K+ 5.2 chloride 110  C02 22  BUN 33  Cr. 1.88  Ca+ 8.6 Glucose 108  H/H 9.2/27.4   WBC  5.2  EKG SB HR 56 with LBBB no acute changes.  Discharge Exam: Blood pressure 112/91, pulse 66, temperature 97.8 F (36.6 C), temperature source Oral, resp. rate 18, height 5\' 10"  (1.778 m), weight 84.2 kg (185 lb 10 oz), SpO2 94.00%.   General:alert and oriented, no complaints, ambulated without dyspnea  Heart:S1S2 RRR  + SEM Lungs:clear without rales  Abd:+ BS, soft, non tender  Ext:no hematoma at cath site     Disposition: 01-Home or Self Care  Discharge Orders    Future Appointments: Provider: Department: Dept Phone: Center:   12/29/2011 9:00 AM Hayden Pedro, MD [facility] [phone] None     Medication List  As of 12/23/2011  9:43 AM   TAKE these medications         amLODipine 10 MG tablet   Commonly known as: NORVASC   Take 1 tablet (10 mg total) by mouth daily.      aspirin EC 81  MG tablet   Take 81 mg by mouth daily.      ezetimibe 10 MG tablet   Commonly known as: ZETIA   Take 10 mg by mouth daily.      fish oil-omega-3 fatty acids 1000 MG capsule   Take 1 g by mouth 3 (three) times daily.      isosorbide mononitrate 30 MG 24 hr tablet   Commonly known as: IMDUR   Take 1 tablet (30 mg total) by mouth daily.      losartan-hydrochlorothiazide 100-25 MG per tablet   Commonly known as: HYZAAR   Take 1 tablet by mouth daily.      metoprolol tartrate 25 MG tablet    Commonly known as: LOPRESSOR   Take 25 mg by mouth 2 (two) times daily.      niacin 500 MG CR tablet   Commonly known as: NIASPAN   Take 500 mg by mouth at bedtime.      nitroGLYCERIN 0.4 MG SL tablet   Commonly known as: NITROSTAT   Place 1 tablet (0.4 mg total) under the tongue every 5 (five) minutes as needed for chest pain.      rosuvastatin 20 MG tablet   Commonly known as: CRESTOR   Take 20 mg by mouth daily.      Ticagrelor 90 MG Tabs tablet   Commonly known as: BRILINTA   Take 1 tablet (90 mg total) by mouth 2 (two) times daily.           Follow-up Information    Follow up with Pixie Casino, MD on 01/09/2012. (AT 10:30 am  FOR FOLLOW UP.)    Contact information:   [address] [place] [phone]         Discharge instructions: Call [facility] if any bleeding, swelling, pain or drainage at cath site.  May shower, no tub baths for 48 hours for groin sticks.   No driving for 3 days.  No lifting over 5 pounds for 3 days.  Heart healthy diet.     SignedIsaiah Serge 12/23/2011, 9:43 AM

## 2011-12-23 NOTE — Discharge Instructions (Signed)
Call [facility] Center if any bleeding, swelling, pain or drainage at cath site.  May shower, no tub baths for 48 hours for groin sticks.   No driving for 3 days.  No lifting over 5 pounds for 3 days.  Heart healthy diet.  Angioplasty Angioplasty is a procedure to widen a narrow blood vessel. The procedure is usually done on the blood vessels of the heart (coronary arteries) but may help vessels to other parts of the body such as the legs. When a vessel in the heart becomes partially blocked there is decreased blood flow to that area. This may lead to chest pain or a heart attack (myocardial infarction).  Angioplasty may be done after a procedure that found a problem or as an emergency to treat a heart attack by opening the blocked arteries. The arteries are usually blocked by cholesterol buildup (plaque) in the lining or walls. LET YOUR CAREGIVER KNOW ABOUT:  Allergies.   Medicines taken, including herbs, eyedrops, over-the-counter medicines, and creams.   Use of steroids (by mouth or creams).   Previous problems with anesthetics or numbing medicines.   Possibility of pregnancy, if this applies.   History of blood clots (thrombophlebitis).   History of bleeding or blood problems.   Previous surgery.   Other health problems.  RISKS AND COMPLICATIONS  Damage to the artery.   A blockage may return.   Bleeding at the insertion site.   Blood clot to another part of the body.  BEFORE THE PROCEDURE  Let your caregiver know if you have had an allergy to dyes used in X-ray, or if you have ever had kidney problems or failure.   Do not eat or drink starting from midnight up to the time of the procedure, or as directed.   You may drink enough water to take your medicines the morning of the procedure if you were instructed to do so.   You should be at the hospital or outpatient facility where the procedure is to be done 60 minutes prior to the procedure or  as directed.  PROCEDURE  You may be given a medicine to help you relax before and during the procedure through an intravenous (IV) access in your hand or arm.   Medicine that numbs the area (local anesthetic) may be used before inserting the long, thin tube (catheter).   You will be prepared for the procedure by washing and shaving the area where the catheter will be inserted. This is usually done in the groin.   A catheter will be inserted into an artery using a guide wire. This is guided under a type of X-ray (fluoroscopy) to the opening of the blocked artery.   Dye is then injected and X-rays are taken.   Once positioned at the narrowed portion of the blood vessel, the balloon is inflated to make the artery wider. Expanding the balloon crushes the plaque into the wall of the vessel and improves the blood flow.   Sometimes the artery may be made wider using a laser or other tools to remove plaque.   When the blood flow is better, the balloon is deflated and the catheter is removed.  AFTER THE PROCEDURE  You will stay in bed for several hours.   The access site will be watched and you will be checked frequently.   Blood tests, X-rays, and an electrocardiogram (EKG) may be done.   You may stay in the hospital overnight for observation.  Document Released:  08/01/2000 Document Revised: 07/24/2011 Document Reviewed: 11/25/2007 [facility] Patient Information 2012 [place].

## 2011-12-23 NOTE — Progress Notes (Signed)
CARDIAC REHAB PHASE I   PRE:  Rate/Rhythm: 63 SR  BP:  Supine:   Sitting: 125/67  Standing:    SaO2:   MODE:  Ambulation: 680 ft   POST:  Rate/Rhythem: 68 SR  BP:  Supine:   Sitting: 150/67  Standing:    SaO2:  0815-0930 Tolerated ambulation well without c/o of cp or SOB. VS stable.Completed discharge education with pt. He agrees to [facility]. CRP in [place], will send referral.  Deon Pilling

## 2011-12-23 NOTE — Progress Notes (Signed)
Subjective: No complaints  Objective: Vital signs in last 24 hours: Temp:  [97.5 F (36.4 C)-98.3 F (36.8 C)] 97.8 F (36.6 C) (05/07 0816) Pulse Rate:  [52-73] 66  (05/07 0816) Resp:  [15-19] 18  (05/07 0816) BP: (102-121)/(51-91) 112/91 mmHg (05/07 0816) SpO2:  [94 %-100 %] 94 % (05/07 0816) Weight:  [84.2 kg (185 lb 10 oz)] 84.2 kg (185 lb 10 oz) (05/07 0724) Weight change:  Last BM Date: 12/20/11 Intake/Output from previous day: +1295 05/06 0701 - 05/07 0700 In: [id number] [P.O.:720; I.V.:875] Out: 300 [Urine:300] Intake/Output this shift:    PE: General:alert and oriented, no complaints, ambulated without dyspnea Heart:S1S2 RRR Lungs:clear without rales Abd:+ BS, soft, non tender Ext:no hematoma at cath site    Lab Results:  [facility] 12/23/11 0335 12/22/11 0726  WBC 5.2 5.1  HGB 9.2* 10.3*  HCT 27.4* 29.8*  PLT 123* 146*   BMET  Basename 12/23/11 0335 12/22/11 0726  NA 139 138  K 5.2* 4.5  CL 110 108  CO2 22 21  GLUCOSE 108* 103*  BUN 33* 41*  CREATININE 1.88* 1.95*  CALCIUM 8.6 8.9   No results found for this basename: TROPONINI:2,CK,MB:2 in the last 72 hours  Lab Results  Component Value Date   CHOL  Value: 119        ATP III CLASSIFICATION:  <200     mg/dL   Desirable  200-239  mg/dL   Borderline High  >=240    mg/dL   High 04/25/2007   HDL 45 04/25/2007   LDLCALC  Value: 49        Total Cholesterol/HDL:CHD Risk Coronary Heart Disease Risk Table                     Men   Women  1/2 Average Risk   3.4   3.3 04/25/2007   TRIG 124 04/25/2007   CHOLHDL 2.6 04/25/2007   Lab Results  Component Value Date   HGBA1C  Value: 5.7 (NOTE)   The ADA recommends the following therapeutic goals for glycemic   control related to Hgb A1C measurement:   Goal of Therapy:   < 7.0% Hgb A1C   Action Suggested:  > 8.0% Hgb A1C   Ref:  Diabetes Care, 22, Suppl. 1, 1999 04/24/2007     No results found for this basename: TSH    Hepatic Function Panel No results found for this basename:  PROT,ALBUMIN,AST,ALT,ALKPHOS,BILITOT,BILIDIR,IBILI in the last 72 hours No results found for this basename: CHOL in the last 72 hours No results found for this basename: PROTIME in the last 72 hours    EKG: Orders placed during the hospital encounter of 12/22/11  . EKG 12-LEAD  . EKG 12-LEAD  . EKG 12-LEAD  . EKG 12-LEAD  . EKG 12-LEAD  . EKG 12-LEAD  . EKG 12-LEAD  . EKG 12-LEAD    Studies/Results: No results found.  Medications: I have reviewed the patient's current medication.    Marland Kitchen aspirin EC  81 mg Oral Daily  . atorvastatin  40 mg Oral q1800  . ezetimibe  10 mg Oral Daily  . fentaNYL      . heparin      . heparin      . isosorbide mononitrate  30 mg Oral Daily  . lidocaine      . metoprolol tartrate  25 mg Oral BID  . midazolam      . niacin  500 mg Oral QHS  . nitroGLYCERIN      .  Ticagrelor  90 mg Oral BID  . DISCONTD: adenosine  16 mL Intravenous Once  . DISCONTD: sodium chloride  3 mL Intravenous Q12H    Assessment/Plan: Patient Active Problem List  Diagnoses  . Dyspnea on exertion, anginal equivilant  . CAD (coronary artery disease), CABG 1996. Promus DES to ostial RCA, 08/2011  . HTN (hypertension),  . CKD (chronic kidney disease) stage 3, GFR 30-59 ml/min  . Aortic stenosis, moderate  . Pulmonary hypertension, mild  . Abnormal cardiovascular stress test, 11/2011 wint inferolateral reversibility  . Recurrent angina status post rotational atherectomy, with PTCA for ISR to RCA, 12/22/2011     Plan: Ambulate, discharge if tolerates without complications.  H/H slightly decreased. Cr. Slightly improved, will check as outpatient. Continue Brilinta.  Follow up with Dr. Debara Pickett or extender.  LOS: 1 day   INGOLD,LAURA R 12/23/2011, 9:00 AM   Agree with note written by Cecilie Kicks RNP  Looks great S/P Ostial RCA CB atherectomy for ISR. Feels great. Exam notable for 2/6 outflow murmur, apical SEM,. Labs OK. Scr decreased. OK for D/C home ROV with Dr.  Debara Pickett. If restenoses, will need to be restented.  Lorretta Harp 12/23/2011 9:13 AM

## 2011-12-29 ENCOUNTER — Encounter (INDEPENDENT_AMBULATORY_CARE_PROVIDER_SITE_OTHER): Payer: Medicare Other | Admitting: Ophthalmology

## 2011-12-29 DIAGNOSIS — H35039 Hypertensive retinopathy, unspecified eye: Secondary | ICD-10-CM

## 2011-12-29 DIAGNOSIS — H348392 Tributary (branch) retinal vein occlusion, unspecified eye, stable: Secondary | ICD-10-CM

## 2011-12-29 DIAGNOSIS — I10 Essential (primary) hypertension: Secondary | ICD-10-CM

## 2011-12-29 DIAGNOSIS — H43819 Vitreous degeneration, unspecified eye: Secondary | ICD-10-CM

## 2012-03-16 ENCOUNTER — Encounter: Payer: Self-pay | Admitting: Vascular Surgery

## 2012-05-27 ENCOUNTER — Other Ambulatory Visit: Payer: Self-pay | Admitting: *Deleted

## 2012-05-27 DIAGNOSIS — Z48812 Encounter for surgical aftercare following surgery on the circulatory system: Secondary | ICD-10-CM

## 2012-05-27 DIAGNOSIS — I6529 Occlusion and stenosis of unspecified carotid artery: Secondary | ICD-10-CM

## 2012-05-28 ENCOUNTER — Encounter: Payer: Self-pay | Admitting: Neurosurgery

## 2012-05-31 ENCOUNTER — Encounter: Payer: Self-pay | Admitting: Neurosurgery

## 2012-05-31 ENCOUNTER — Ambulatory Visit (INDEPENDENT_AMBULATORY_CARE_PROVIDER_SITE_OTHER): Payer: Medicare Other | Admitting: Neurosurgery

## 2012-05-31 ENCOUNTER — Other Ambulatory Visit (INDEPENDENT_AMBULATORY_CARE_PROVIDER_SITE_OTHER): Payer: Medicare Other | Admitting: *Deleted

## 2012-05-31 VITALS — BP 168/76 | HR 56 | Resp 16 | Ht 69.5 in | Wt 180.5 lb

## 2012-05-31 DIAGNOSIS — I6529 Occlusion and stenosis of unspecified carotid artery: Secondary | ICD-10-CM

## 2012-05-31 DIAGNOSIS — Z48812 Encounter for surgical aftercare following surgery on the circulatory system: Secondary | ICD-10-CM

## 2012-05-31 NOTE — Progress Notes (Signed)
[facility] [place] Carotid Office Note  CC: Annual carotid surveillance Referring Physician: Brabham  History of Present Illness: 78 year old male patient of Dr. Trula Slade who status post bilateral CEAs in 2008. The patient denies any signs or symptoms of CVA, TIA, amaurosis fugax or any neural deficit. The patient denies any new medical diagnoses or recent surgery.  Past Medical History  Diagnosis Date  . Shortness of breath   . Chronic kidney disease   . Coronary artery disease   . Hypertension   . Stroke     hx of TIA  . Blood transfusion     "  no reaction to transfusion "  . Dysrhythmia     LLB  anf leaking valve per patient  . Dyspnea on exertion, anginal equivilant 09/16/2011  . CAD (coronary artery disease), CABG 1996. 09/16/2011  . HTN (hypertension),uncontrolled 09/16/2011  . CKD (chronic kidney disease) stage 2, GFR 60-89 ml/min 09/16/2011  . Pulmonary hypertension, mild 09/17/2011  . Angina effort, 12/2011 with DOE anginal equivlent 12/22/2011  . Abnormal cardiovascular stress test, 11/2011 wint inferolateral reveribility 12/22/2011  . Recurrent angina status post rotational atherectomy, with PTCA for ISR to RCA, 12/22/2011 12/22/2011  . Carotid artery disease, Rt. CEA, 08/04/07, Lt. CEA 04/26/07. 12/23/2011    ROS: [x]  Positive   [ ]  Denies    General: [ ]  Weight loss, [ ]  Fever, [ ]  chills Neurologic: [ ]  Dizziness, [ ]  Blackouts, [ ]  Seizure [ ]  Stroke, [ ]  "Mini stroke", [ ]  Slurred speech, [ ]  Temporary blindness; [ ]  weakness in arms or legs, [ ]  Hoarseness Cardiac: [ ]  Chest pain/pressure, [ ]  Shortness of breath at rest [ ]  Shortness of breath with exertion, [ ]  Atrial fibrillation or irregular heartbeat Vascular: [ ]  Pain in legs with walking, [ ]  Pain in legs at rest, [ ]  Pain in legs at night,  [ ]  Non-healing ulcer, [ ]  Blood clot in vein/DVT,   Pulmonary: [ ]  Home oxygen, [ ]  Productive cough, [ ]  Coughing up blood, [ ]  Asthma,  [ ]   Wheezing Musculoskeletal:  [ ]  Arthritis, [ ]  Low back pain, [ ]  Joint pain Hematologic: [ ]  Easy Bruising, [ ]  Anemia; [ ]  Hepatitis Gastrointestinal: [ ]  Blood in stool, [ ]  Gastroesophageal Reflux/heartburn, [ ]  Trouble swallowing Urinary: [ ]  chronic Kidney disease, [ ]  on HD - [ ]  MWF or [ ]  TTHS, [ ]  Burning with urination, [ ]  Difficulty urinating Skin: [ ]  Rashes, [ ]  Wounds Psychological: [ ]  Anxiety, [ ]  Depression   Social History History  Substance Use Topics  . Smoking status: Former Smoker    Types: Cigarettes  . Smokeless tobacco: Former Systems developer   Comment: rarely smoked when he did smoke   . Alcohol Use: No    Family History No family history on file.  No Known Allergies  Current Outpatient Prescriptions  Medication Sig Dispense Refill  . amLODipine (NORVASC) 10 MG tablet Take 1 tablet (10 mg total) by mouth daily.  30 tablet  5  . aspirin EC 81 MG tablet Take 81 mg by mouth daily.      Marland Kitchen ezetimibe (ZETIA) 10 MG tablet Take 10 mg by mouth daily.      . fish oil-omega-3 fatty acids 1000 MG capsule Take 1 g by mouth 3 (three) times daily.      . isosorbide mononitrate (IMDUR) 30 MG 24 hr tablet Take 1 tablet (30 mg total) by mouth  daily.  30 tablet  5  . losartan-hydrochlorothiazide (HYZAAR) 100-25 MG per tablet Take 1 tablet by mouth daily.      . metoprolol tartrate (LOPRESSOR) 25 MG tablet Take 25 mg by mouth 2 (two) times daily.      . niacin (NIASPAN) 500 MG CR tablet Take 500 mg by mouth at bedtime.      . nitroGLYCERIN (NITROSTAT) 0.4 MG SL tablet Place 1 tablet (0.4 mg total) under the tongue every 5 (five) minutes as needed for chest pain.  25 tablet  2  . rosuvastatin (CRESTOR) 20 MG tablet Take 20 mg by mouth daily.      . Ticagrelor (BRILINTA) 90 MG TABS tablet Take 1 tablet (90 mg total) by mouth 2 (two) times daily.  60 tablet  0   No current facility-administered medications for this visit.   Facility-Administered Medications Ordered in Other Visits   Medication Dose Route Frequency Provider Last Rate Last Dose  . sodium chloride 0.9 % injection 3 mL  3 mL Intravenous PRN Pixie Casino, MD        Physical Examination  There were no vitals filed for this visit.  There is no height or weight on file to calculate BMI.  General:  WDWN in NAD Gait: Normal HEENT: WNL Eyes: Pupils equal Pulmonary: normal non-labored breathing , without Rales, rhonchi,  wheezing Cardiac: RRR, without  Murmurs, rubs or gallops; Abdomen: soft, NT, no masses Skin: no rashes, ulcers noted  Vascular Exam Pulses: 3+ radial pulses bilaterally Carotid bruits: Carotid pulses to auscultation no bruits are heard Extremities without ischemic changes, no Gangrene , no cellulitis; no open wounds;  Musculoskeletal: no muscle wasting or atrophy   Neurologic: A&O X 3; Appropriate Affect ; SENSATION: normal; MOTOR FUNCTION:  moving all extremities equally. Speech is fluent/normal  Non-Invasive Vascular Imaging CAROTID DUPLEX 05/31/2012  Right ICA 0 - 19% stenosis Left ICA 0 - 19% stenosis   ASSESSMENT/PLAN: Asymptomatic patient status post bilateral CEAs in 2008. The patient will followup in one year with repeat carotid duplex. The patient's questions were encouraged and answered, he is in agreement with this plan.  Beatris Ship ANP   Clinic MD: Trula Slade

## 2012-05-31 NOTE — Addendum Note (Signed)
Addended by: Mena Goes on: 05/31/2012 03:10 PM   Modules accepted: Orders

## 2012-06-03 ENCOUNTER — Encounter (HOSPITAL_COMMUNITY): Payer: Self-pay | Admitting: Pharmacy Technician

## 2012-06-09 ENCOUNTER — Inpatient Hospital Stay (HOSPITAL_COMMUNITY): Payer: Medicare Other

## 2012-06-09 ENCOUNTER — Encounter (HOSPITAL_COMMUNITY): Payer: Self-pay | Admitting: General Practice

## 2012-06-09 ENCOUNTER — Inpatient Hospital Stay (HOSPITAL_COMMUNITY)
Admission: RE | Admit: 2012-06-09 | Discharge: 2012-06-11 | DRG: 247 | Disposition: A | Discharge status: Home / Self Care | Payer: Medicare Other | Source: Ambulatory Visit | Attending: Internal Medicine | Admitting: Internal Medicine

## 2012-06-09 DIAGNOSIS — Z955 Presence of coronary angioplasty implant and graft: Secondary | ICD-10-CM

## 2012-06-09 DIAGNOSIS — I10 Essential (primary) hypertension: Secondary | ICD-10-CM | POA: Diagnosis present

## 2012-06-09 DIAGNOSIS — I129 Hypertensive chronic kidney disease with stage 1 through stage 4 chronic kidney disease, or unspecified chronic kidney disease: Secondary | ICD-10-CM | POA: Diagnosis present

## 2012-06-09 DIAGNOSIS — D649 Anemia, unspecified: Secondary | ICD-10-CM | POA: Diagnosis present

## 2012-06-09 DIAGNOSIS — Z23 Encounter for immunization: Secondary | ICD-10-CM

## 2012-06-09 DIAGNOSIS — Z87891 Personal history of nicotine dependence: Secondary | ICD-10-CM

## 2012-06-09 DIAGNOSIS — I200 Unstable angina: Secondary | ICD-10-CM | POA: Diagnosis present

## 2012-06-09 DIAGNOSIS — I209 Angina pectoris, unspecified: Secondary | ICD-10-CM

## 2012-06-09 DIAGNOSIS — Z9861 Coronary angioplasty status: Secondary | ICD-10-CM

## 2012-06-09 DIAGNOSIS — D696 Thrombocytopenia, unspecified: Secondary | ICD-10-CM | POA: Diagnosis not present

## 2012-06-09 DIAGNOSIS — I272 Pulmonary hypertension, unspecified: Secondary | ICD-10-CM | POA: Diagnosis present

## 2012-06-09 DIAGNOSIS — I2510 Atherosclerotic heart disease of native coronary artery without angina pectoris: Principal | ICD-10-CM | POA: Diagnosis present

## 2012-06-09 DIAGNOSIS — I2789 Other specified pulmonary heart diseases: Secondary | ICD-10-CM | POA: Diagnosis present

## 2012-06-09 DIAGNOSIS — Z8673 Personal history of transient ischemic attack (TIA), and cerebral infarction without residual deficits: Secondary | ICD-10-CM

## 2012-06-09 DIAGNOSIS — Z7982 Long term (current) use of aspirin: Secondary | ICD-10-CM

## 2012-06-09 DIAGNOSIS — Z79899 Other long term (current) drug therapy: Secondary | ICD-10-CM

## 2012-06-09 DIAGNOSIS — I359 Nonrheumatic aortic valve disorder, unspecified: Secondary | ICD-10-CM | POA: Diagnosis present

## 2012-06-09 DIAGNOSIS — N183 Chronic kidney disease, stage 3 unspecified: Secondary | ICD-10-CM | POA: Diagnosis present

## 2012-06-09 DIAGNOSIS — I350 Nonrheumatic aortic (valve) stenosis: Secondary | ICD-10-CM | POA: Diagnosis present

## 2012-06-09 DIAGNOSIS — Z7902 Long term (current) use of antithrombotics/antiplatelets: Secondary | ICD-10-CM | POA: Diagnosis present

## 2012-06-09 LAB — CBC WITH DIFFERENTIAL/PLATELET
Basophils Absolute: 0 10*3/uL (ref 0.0–0.1)
Basophils Relative: 0 % (ref 0–1)
Hemoglobin: 11.2 g/dL — ABNORMAL LOW (ref 13.0–17.0)
MCHC: 34 g/dL (ref 30.0–36.0)
Monocytes Relative: 7 % (ref 3–12)
Neutro Abs: 3.5 10*3/uL (ref 1.7–7.7)
Neutrophils Relative %: 68 % (ref 43–77)

## 2012-06-09 LAB — PLATELET INHIBITION P2Y12: Platelet Function  P2Y12: 55 [PRU] — ABNORMAL LOW (ref 194–418)

## 2012-06-09 LAB — BASIC METABOLIC PANEL
BUN: 29 mg/dL — ABNORMAL HIGH (ref 6–23)
Calcium: 8.9 mg/dL (ref 8.4–10.5)
Creatinine, Ser: 1.74 mg/dL — ABNORMAL HIGH (ref 0.50–1.35)
GFR calc Af Amer: 42 mL/min — ABNORMAL LOW (ref >90)
GFR calc non Af Amer: 36 mL/min — ABNORMAL LOW (ref >90)
Potassium: 4.1 mEq/L (ref 3.5–5.1)

## 2012-06-09 LAB — CBC
MCHC: 33.5 g/dL (ref 30.0–36.0)
Platelets: 138 10*3/uL — ABNORMAL LOW (ref 150–400)
RDW: 14.1 % (ref 11.5–15.5)

## 2012-06-09 LAB — COMPREHENSIVE METABOLIC PANEL
Alkaline Phosphatase: 59 U/L (ref 39–117)
CO2: 25 mEq/L (ref 19–32)
Chloride: 104 mEq/L (ref 96–112)
Creatinine, Ser: 1.67 mg/dL — ABNORMAL HIGH (ref 0.50–1.35)
GFR calc Af Amer: 44 mL/min — ABNORMAL LOW (ref >90)
Potassium: 4.6 mEq/L (ref 3.5–5.1)
Sodium: 136 mEq/L (ref 135–145)

## 2012-06-09 LAB — MAGNESIUM: Magnesium: 2.2 mg/dL (ref 1.5–2.5)

## 2012-06-09 LAB — APTT: aPTT: 42 seconds — ABNORMAL HIGH (ref 24–37)

## 2012-06-09 LAB — PROTIME-INR: INR: 1.14 (ref 0.00–1.49)

## 2012-06-09 MED ORDER — SODIUM CHLORIDE 0.9 % IV SOLN
INTRAVENOUS | Status: DC
  Administered 2012-06-09: 13:00:00 via INTRAVENOUS

## 2012-06-09 MED ORDER — ACETAMINOPHEN 650 MG RE SUPP: 650.0000 mg | Freq: Four times a day (QID) | RECTAL | Status: DC | PRN

## 2012-06-09 MED ORDER — ONDANSETRON HCL 4 MG/2ML IJ SOLN: 4.0000 mg | Freq: Four times a day (QID) | INTRAMUSCULAR | Status: DC | PRN

## 2012-06-09 MED ORDER — ISOSORBIDE MONONITRATE ER 60 MG PO TB24
60.0000 mg | ORAL_TABLET | Freq: Every day | ORAL | Status: DC
  Administered 2012-06-09 – 2012-06-10 (×2): 60 mg via ORAL
  Filled 2012-06-09 (×4): qty 1

## 2012-06-09 MED ORDER — AMLODIPINE BESYLATE 10 MG PO TABS
10.0000 mg | ORAL_TABLET | Freq: Every day | ORAL | Status: DC
  Administered 2012-06-09 – 2012-06-10 (×2): 10 mg via ORAL
  Filled 2012-06-09 (×4): qty 1

## 2012-06-09 MED ORDER — SODIUM CHLORIDE 0.9 % IV SOLN: 250.0000 mL | INTRAVENOUS | Status: DC | PRN

## 2012-06-09 MED ORDER — ASPIRIN 81 MG PO CHEW
324.0000 mg | CHEWABLE_TABLET | ORAL | Status: AC
  Administered 2012-06-10: 324 mg via ORAL
  Filled 2012-06-09: qty 4

## 2012-06-09 MED ORDER — ASPIRIN EC 81 MG PO TBEC: 81.0000 mg | DELAYED_RELEASE_TABLET | Freq: Every day | ORAL | Status: DC

## 2012-06-09 MED ORDER — TRAMADOL HCL 50 MG PO TABS: 50.0000 mg | ORAL_TABLET | Freq: Four times a day (QID) | ORAL | Status: DC | PRN

## 2012-06-09 MED ORDER — OMEGA-3-ACID ETHYL ESTERS 1 G PO CAPS
1.0000 g | ORAL_CAPSULE | Freq: Three times a day (TID) | ORAL | Status: DC
  Administered 2012-06-09 – 2012-06-11 (×4): 1 g via ORAL
  Filled 2012-06-09 (×9): qty 1

## 2012-06-09 MED ORDER — SODIUM CHLORIDE 0.9 % IJ SOLN: 3.0000 mL | Freq: Two times a day (BID) | INTRAMUSCULAR | Status: DC

## 2012-06-09 MED ORDER — ALPRAZOLAM 0.25 MG PO TABS: 0.2500 mg | ORAL_TABLET | Freq: Three times a day (TID) | ORAL | Status: DC | PRN

## 2012-06-09 MED ORDER — ATORVASTATIN CALCIUM 40 MG PO TABS
40.0000 mg | ORAL_TABLET | Freq: Every day | ORAL | Status: DC
  Filled 2012-06-09: qty 1

## 2012-06-09 MED ORDER — ACETAMINOPHEN 325 MG PO TABS: 650.0000 mg | ORAL_TABLET | Freq: Four times a day (QID) | ORAL | Status: DC | PRN

## 2012-06-09 MED ORDER — TICAGRELOR 90 MG PO TABS
90.0000 mg | ORAL_TABLET | Freq: Two times a day (BID) | ORAL | Status: DC
  Administered 2012-06-09 – 2012-06-11 (×3): 90 mg via ORAL
  Filled 2012-06-09 (×7): qty 1

## 2012-06-09 MED ORDER — SODIUM CHLORIDE 0.9 % IV SOLN
1.0000 mL/kg/h | INTRAVENOUS | Status: DC
  Administered 2012-06-10: 1 mL/kg/h via INTRAVENOUS

## 2012-06-09 MED ORDER — NIACIN ER (ANTIHYPERLIPIDEMIC) 500 MG PO TBCR
500.0000 mg | EXTENDED_RELEASE_TABLET | Freq: Every day | ORAL | Status: DC
  Administered 2012-06-10: 22:00:00 500 mg via ORAL
  Filled 2012-06-09 (×5): qty 1

## 2012-06-09 MED ORDER — ZOLPIDEM TARTRATE 5 MG PO TABS: 5.0000 mg | ORAL_TABLET | Freq: Every evening | ORAL | Status: DC | PRN

## 2012-06-09 MED ORDER — ALUM & MAG HYDROXIDE-SIMETH 200-200-20 MG/5ML PO SUSP: 30.0000 mL | Freq: Four times a day (QID) | ORAL | Status: DC | PRN

## 2012-06-09 MED ORDER — INFLUENZA VIRUS VACC SPLIT PF IM SUSP
0.5000 mL | INTRAMUSCULAR | Status: AC
  Administered 2012-06-11: 0.5 mL via INTRAMUSCULAR
  Filled 2012-06-09 (×2): qty 0.5

## 2012-06-09 MED ORDER — ONDANSETRON HCL 4 MG PO TABS: 4.0000 mg | ORAL_TABLET | Freq: Four times a day (QID) | ORAL | Status: DC | PRN

## 2012-06-09 MED ORDER — SODIUM CHLORIDE 0.9 % IJ SOLN: 3.0000 mL | INTRAMUSCULAR | Status: DC | PRN

## 2012-06-09 MED ORDER — ASPIRIN EC 81 MG PO TBEC
81.0000 mg | DELAYED_RELEASE_TABLET | Freq: Every day | ORAL | Status: DC
  Administered 2012-06-09: 81 mg via ORAL
  Filled 2012-06-09 (×4): qty 1

## 2012-06-09 MED ORDER — METOPROLOL TARTRATE 25 MG PO TABS
25.0000 mg | ORAL_TABLET | Freq: Two times a day (BID) | ORAL | Status: DC
  Administered 2012-06-09 – 2012-06-11 (×3): 25 mg via ORAL
  Filled 2012-06-09 (×7): qty 1

## 2012-06-09 MED ORDER — DIAZEPAM 5 MG PO TABS
5.0000 mg | ORAL_TABLET | ORAL | Status: AC
  Administered 2012-06-10: 5 mg via ORAL
  Filled 2012-06-09: qty 1

## 2012-06-09 MED ORDER — NITROGLYCERIN 0.4 MG SL SUBL: 0.4000 mg | SUBLINGUAL_TABLET | SUBLINGUAL | Status: DC | PRN

## 2012-06-09 MED ORDER — EZETIMIBE 10 MG PO TABS
10.0000 mg | ORAL_TABLET | Freq: Every day | ORAL | Status: DC
  Administered 2012-06-09 – 2012-06-10 (×2): 10 mg via ORAL
  Filled 2012-06-09 (×4): qty 1

## 2012-06-09 MED ORDER — OMEGA-3 FATTY ACIDS 1000 MG PO CAPS: 1.0000 g | ORAL_CAPSULE | Freq: Three times a day (TID) | ORAL | Status: DC

## 2012-06-09 MED ORDER — ROSUVASTATIN CALCIUM 20 MG PO TABS
20.0000 mg | ORAL_TABLET | Freq: Every day | ORAL | Status: DC
  Administered 2012-06-09 – 2012-06-10 (×2): 20 mg via ORAL
  Filled 2012-06-09 (×3): qty 1

## 2012-06-09 NOTE — H&P (Signed)
[name] is an 78 y.o. male.   Chief Complaint:Increasing episodes of shortness of breath and chest pain HPI: 78 year old patient of Dr. Lysbeth Penner that is being admitted for IV hydration prior to cardiac catheterizationscheduled for 06/10/2012.  Patient has a history of coronary disease with bypass grafting in 1996 and recent PCI with stent to the ostial RCA January 2013 which is an ungrafted vessel.  He then developed ostial restenosis of the RCA and underwent balloon atherectomy with Cutting Balloon angioplasty to the ostial RCA in May of 2013. Symptoms markedly improved after this procedure.   Recently he has had increased episodes of shortness of breath and chest pain very much like symptoms prior to this last Cutting Balloon angioplasty. He was originally treated medically with increasing IMDUR to 60 mg daily which did not improve his symptoms.  Dr. Debara Pickett felt re-cathing was the best option to evaluate his symptoms. This is most likely reocclusion of the RCA ostially.  Other history does include renal disease followed by Dr. Justin Mend. Losartan/hct was recently stopped by Dr. Justin Mend secondary to worsening kidney function.  Patient is now brought in 24 hours in advance for IV hydration prior to his cardiac catheterization.  Patient also has aortic stenosis with valve area of 1.4-1.5 moderate aortic stenosis.  EKG sinus bradycardia left bundle branch block and a no ischemic changes on office visit.  Past Medical History  Diagnosis Date  . Shortness of breath   . Chronic kidney disease   . Coronary artery disease   . Hypertension   . Stroke     hx of TIA  . Blood transfusion     "  no reaction to transfusion "  . Dysrhythmia     LLB  anf leaking valve per patient  . Dyspnea on exertion, anginal equivilant 09/16/2011  . CAD (coronary artery disease), CABG 1996. 09/16/2011  . HTN (hypertension),uncontrolled 09/16/2011  . CKD (chronic kidney disease) stage 2, GFR 60-89 ml/min 09/16/2011  .  Pulmonary hypertension, mild 09/17/2011  . Angina effort, 12/2011 with DOE anginal equivlent 12/22/2011  . Abnormal cardiovascular stress test, 11/2011 wint inferolateral reveribility 12/22/2011  . Recurrent angina status post rotational atherectomy, with PTCA for ISR to RCA, 12/22/2011 12/22/2011  . Carotid artery disease, Rt. CEA, 08/04/07, Lt. CEA 04/26/07. 12/23/2011    Past Surgical History  Procedure Date  . Coronary artery bypass graft   . Cardiac catheterization 09/16/2011 and May 2013    stent  . Coronary angioplasty   . Cea 2008    Family History  Problem Relation Age of Onset  . Cancer Mother   . Heart disease Mother   . Alcohol abuse Father   . Depression Father    Social History:  reports that he has quit smoking. His smoking use included Cigarettes. He has quit using smokeless tobacco. He reports that he does not drink alcohol or use illicit drugs.  Allergies: No Known Allergies  Medications Prior to Admission  Medication Sig Dispense Refill  . amLODipine (NORVASC) 10 MG tablet Take 1 tablet (10 mg total) by mouth daily.  30 tablet  5  . aspirin EC 81 MG tablet Take 81 mg by mouth daily.      Marland Kitchen ezetimibe (ZETIA) 10 MG tablet Take 10 mg by mouth daily.      . fish oil-omega-3 fatty acids 1000 MG capsule Take 1 g by mouth 3 (three) times daily.      . isosorbide mononitrate (IMDUR) 60 MG 24 hr tablet  Take 60 mg by mouth daily.      . metoprolol tartrate (LOPRESSOR) 25 MG tablet Take 25 mg by mouth 2 (two) times daily.      . niacin (NIASPAN) 500 MG CR tablet Take 500 mg by mouth at bedtime.      . nitroGLYCERIN (NITROSTAT) 0.4 MG SL tablet Place 1 tablet (0.4 mg total) under the tongue every 5 (five) minutes as needed for chest pain.  25 tablet  2  . rosuvastatin (CRESTOR) 20 MG tablet Take 20 mg by mouth daily.      . Ticagrelor (BRILINTA) 90 MG TABS tablet Take 1 tablet (90 mg total) by mouth 2 (two) times daily.  60 tablet  0    ROS: General:no colds or fevers Skin:no  rashes HEENT:no blurred vision [id number] pain with activity, improves with rest PUL:sob with activity improves with rest GI:no diarrhea constipation or melena GU:no hematuria MS:no joint pain Neuro:no syncope or dizziness Endo:no diabetes   Blood pressure 127/69, pulse 53, temperature 97.6 F (36.4 C), temperature source Oral, resp. rate 12, height 5\' 9"  (1.753 m), weight 80.332 kg (177 lb 1.6 oz), SpO2 96.00%. PE: General:alert and oriented, pleasant affect Skin:warm and dry, brisk capillary refill HEENT:normocephalic, sclera clear Neck:+ lt bruit, no JVD Heart:S1S2 RRR with 2/6 systolic murmur Lungs:clear without rales rhonchi or wheezes Abd:+ BS, soft, non tender Ext:no edema Neuro:alert and oriented X 3 MAE, follows commands    Assessment/Plan Principal Problem:  *Crescendo angina Active Problems:  Recurrent angina status post rotational atherectomy, with PTCA for ISR to RCA, 12/22/2011.  CKD (chronic kidney disease) stage 3, GFR 30-59 ml/min  HTN (hypertension),  Aortic stenosis, moderate  Pulmonary hypertension, mild  PLAN:Begin IV fluids now.  Check labs, cath tomorrow.  INGOLD,LAURA R 06/09/2012, 11:55 AM

## 2012-06-09 NOTE — H&P (Signed)
Pt. Seen and examined. Agree with the NP/PA-C note as written.  Plan LHC tomorrow - hydrate overnight.  Pixie Casino, MD, [facility] Attending Cardiologist [facility] [place]

## 2012-06-10 ENCOUNTER — Encounter (HOSPITAL_COMMUNITY)
Admission: RE | Disposition: A | Discharge status: Home / Self Care | Payer: Self-pay | Source: Ambulatory Visit | Attending: Internal Medicine

## 2012-06-10 HISTORY — PX: LEFT HEART CATHETERIZATION WITH CORONARY ANGIOGRAM: SHX5451

## 2012-06-10 HISTORY — PX: CARDIAC CATHETERIZATION: SHX172

## 2012-06-10 LAB — BASIC METABOLIC PANEL
Calcium: 8.8 mg/dL (ref 8.4–10.5)
GFR calc non Af Amer: 48 mL/min — ABNORMAL LOW (ref >90)
Sodium: 139 mEq/L (ref 135–145)

## 2012-06-10 LAB — GLUCOSE, CAPILLARY: Glucose-Capillary: 119 mg/dL — ABNORMAL HIGH (ref 70–99)

## 2012-06-10 SURGERY — LEFT HEART CATHETERIZATION WITH CORONARY ANGIOGRAM
Anesthesia: LOCAL

## 2012-06-10 MED ORDER — SODIUM CHLORIDE 0.9 % IV SOLN: 1.0000 mL/kg/h | INTRAVENOUS | Status: AC

## 2012-06-10 MED ORDER — SODIUM CHLORIDE 0.9 % IV SOLN: 250.0000 mL | INTRAVENOUS | Status: DC

## 2012-06-10 MED ORDER — SODIUM CHLORIDE 0.9 % IJ SOLN: 3.0000 mL | Freq: Two times a day (BID) | INTRAMUSCULAR | Status: DC

## 2012-06-10 MED ORDER — ACETAMINOPHEN 325 MG PO TABS: 650.0000 mg | ORAL_TABLET | ORAL | Status: DC | PRN

## 2012-06-10 MED ORDER — ATROPINE SULFATE 1 MG/ML IJ SOLN
INTRAMUSCULAR | Status: AC
  Filled 2012-06-10: qty 1

## 2012-06-10 MED ORDER — BIVALIRUDIN 250 MG IV SOLR
INTRAVENOUS | Status: AC
  Filled 2012-06-10: qty 250

## 2012-06-10 MED ORDER — LIDOCAINE HCL (PF) 1 % IJ SOLN
INTRAMUSCULAR | Status: AC
  Filled 2012-06-10: qty 30

## 2012-06-10 MED ORDER — MORPHINE SULFATE 2 MG/ML IJ SOLN: 1.0000 mg | INTRAMUSCULAR | Status: DC | PRN

## 2012-06-10 MED ORDER — HEPARIN (PORCINE) IN NACL 2-0.9 UNIT/ML-% IJ SOLN
INTRAMUSCULAR | Status: AC
  Filled 2012-06-10: qty 1000

## 2012-06-10 MED ORDER — SODIUM CHLORIDE 0.9 % IJ SOLN: 3.0000 mL | INTRAMUSCULAR | Status: DC | PRN

## 2012-06-10 MED ORDER — NITROGLYCERIN 0.2 MG/ML ON CALL CATH LAB
INTRAVENOUS | Status: AC
  Filled 2012-06-10: qty 1

## 2012-06-10 MED ORDER — ONDANSETRON HCL 4 MG/2ML IJ SOLN: 4.0000 mg | Freq: Four times a day (QID) | INTRAMUSCULAR | Status: DC | PRN

## 2012-06-10 NOTE — Progress Notes (Signed)
[facility]  Subjective: No Complaints.  Objective: Vital signs in last 24 hours: Temp:  [97.4 F (36.3 C)-97.8 F (36.6 C)] 97.5 F (36.4 C) (10/24 0900) Pulse Rate:  [53-100] 54  (10/24 0900) Resp:  [12-17] 16  (10/24 0440) BP: (126-156)/(67-72) 134/70 mmHg (10/24 0900) SpO2:  [90 %-100 %] 100 % (10/24 0900) Weight:  [80.332 kg (177 lb 1.6 oz)] 80.332 kg (177 lb 1.6 oz) (10/23 1052) Last BM Date: 06/09/12  Intake/Output from previous day: 10/23 0701 - 10/24 0700 In: 720 [P.O.:720] Out: -  Intake/Output this shift:    Medications Current Facility-Administered Medications  Medication Dose Route Frequency Provider Last Rate Last Dose  . 0.9 %  sodium chloride infusion   Intravenous Continuous Cecilie Kicks, NP 50 mL/hr at 06/09/12 1315    . 0.9 %  sodium chloride infusion  250 mL Intravenous PRN Cecilie Kicks, NP      . 0.9 %  sodium chloride infusion  1 mL/kg/hr Intravenous Continuous Cecilie Kicks, NP 80.3 mL/hr at 06/10/12 0407 1 mL/kg/hr at 06/10/12 0407  . acetaminophen (TYLENOL) tablet 650 mg  650 mg Oral Q6H PRN Cecilie Kicks, NP       Or  . acetaminophen (TYLENOL) suppository 650 mg  650 mg Rectal Q6H PRN Cecilie Kicks, NP      . ALPRAZolam Duanne Moron) tablet 0.25 mg  0.25 mg Oral TID PRN Erlene Quan, PA      . alum & mag hydroxide-simeth (MAALOX/MYLANTA) 200-200-20 MG/5ML suspension 30 mL  30 mL Oral Q6H PRN Cecilie Kicks, NP      . amLODipine (NORVASC) tablet 10 mg  10 mg Oral Daily Cecilie Kicks, NP   10 mg at 06/09/12 2232  . aspirin chewable tablet 324 mg  324 mg Oral Pre-Cath Cecilie Kicks, NP   324 mg at 06/10/12 0509  . aspirin EC tablet 81 mg  81 mg Oral Daily Cecilie Kicks, NP   81 mg at 06/09/12 2232  . diazepam (VALIUM) tablet 5 mg  5 mg Oral On Call Cecilie Kicks, NP   5 mg at 06/10/12 0903  . ezetimibe (ZETIA) tablet 10 mg  10 mg Oral Daily Cecilie Kicks, NP   10 mg at 06/09/12 2231  . influenza  inactive virus vaccine (FLUZONE/FLUARIX)  injection 0.5 mL  0.5 mL Intramuscular Tomorrow-1000 Pixie Casino, MD      . isosorbide mononitrate (IMDUR) 24 hr tablet 60 mg  60 mg Oral Daily Cecilie Kicks, NP   60 mg at 06/09/12 2232  . metoprolol tartrate (LOPRESSOR) tablet 25 mg  25 mg Oral BID Cecilie Kicks, NP   25 mg at 06/09/12 2231  . niacin (NIASPAN) CR tablet 500 mg  500 mg Oral QHS Cecilie Kicks, NP      . nitroGLYCERIN (NITROSTAT) SL tablet 0.4 mg  0.4 mg Sublingual Q5 min PRN Cecilie Kicks, NP      . omega-3 acid ethyl esters (LOVAZA) capsule 1 g  1 g Oral TID Pixie Casino, MD   1 g at 06/09/12 2230  . ondansetron (ZOFRAN) tablet 4 mg  4 mg Oral Q6H PRN Cecilie Kicks, NP       Or  . ondansetron [facility]) injection 4 mg  4 mg Intravenous Q6H PRN Cecilie Kicks, NP      . rosuvastatin (CRESTOR) tablet 20 mg  20 mg Oral q1800 Pixie Casino, MD   20 mg at 06/09/12 2200  . sodium chloride 0.9 %  injection 3 mL  3 mL Intravenous Q12H Cecilie Kicks, NP      . sodium chloride 0.9 % injection 3 mL  3 mL Intravenous Q12H Cecilie Kicks, NP      . sodium chloride 0.9 % injection 3 mL  3 mL Intravenous PRN Cecilie Kicks, NP      . Ticagrelor (BRILINTA) tablet 90 mg  90 mg Oral BID Cecilie Kicks, NP   90 mg at 06/09/12 2233  . traMADol (ULTRAM) tablet 50 mg  50 mg Oral Q6H PRN Erlene Quan, PA      . zolpidem (AMBIEN) tablet 5 mg  5 mg Oral QHS PRN Cecilie Kicks, NP      . DISCONTD: aspirin EC tablet 81 mg  81 mg Oral Daily Cecilie Kicks, NP      . DISCONTD: atorvastatin (LIPITOR) tablet 40 mg  40 mg Oral q1800 Cecilie Kicks, NP      . DISCONTD: fish oil-omega-3 fatty acids capsule 1 g  1 g Oral TID Cecilie Kicks, NP       Facility-Administered Medications Ordered in Other Encounters  Medication Dose Route Frequency Provider Last Rate Last Dose  . sodium chloride 0.9 % injection 3 mL  3 mL Intravenous PRN Pixie Casino, MD        PE: General appearance: alert, cooperative and no distress Lungs: clear to auscultation bilaterally Heart:  regular rate and rhythm and 2/6 systolic MM Extremities: No LEE Pulses: 2+ and symmetric Neurologic: Grossly normal  Lab Results:   Basename 06/09/12 1621 06/09/12 1406  WBC 5.2 5.1  HGB 10.4* 11.2*  HCT 31.0* 32.9*  PLT 138* 151   BMET  Basename 06/10/12 0530 06/09/12 1621 06/09/12 1406  NA 139 137 136  K 4.6 4.1 4.6  CL 109 104 104  CO2 22 24 25   GLUCOSE 99 148* 143*  BUN 25* 29* 29*  CREATININE 1.39* 1.74* 1.67*  CALCIUM 8.8 8.9 9.2   PT/INR  Basename 06/09/12 1406  LABPROT 14.4  INR 1.14    Assessment/Plan   Principal Problem:  *Crescendo angina Active Problems:  HTN (hypertension),  CKD (chronic kidney disease) stage 3, GFR 30-59 ml/min  Aortic stenosis, moderate  Pulmonary hypertension, mild  Recurrent angina status post rotational atherectomy, with PTCA for ISR to RCA, 12/22/2011.  Plan:  Left heart cath today.   SCr improved.  Anemic.  Thrombocytopenia.   LOS: 1 day    [name] 06/10/2012 9:16 AM

## 2012-06-10 NOTE — H&P (Signed)
     [facility]          INTERVAL PROCEDURE H&P   History and Physical Interval Note:  06/10/2012 9:20 AM  [name] has presented today for their planned procedure. The various methods of treatment have been discussed with the patient and family. After consideration of risks, benefits and other options for treatment, the patient has consented to the procedure.  The patients' outpatient history has been reviewed, patient examined, and no change in status from most recent office note within the past 30 days. I have reviewed the patients' chart and labs and will proceed as planned. Questions were answered to the patient's satisfaction.   Pixie Casino, MD, [facility] Attending Cardiologist [facility] [place] [facility] 06/10/2012, 9:20 AM

## 2012-06-10 NOTE — CV Procedure (Signed)
[name]     CARDIAC CATHETERIZATION REPORT  [name]   [place]:[id number] Dec 28, 1933  Performing Cardiologist: [name] Primary Physician: [name], MD Primary Cardiologist:  Dr. Debara Pickett  Procedures Performed:  Left Heart Catheterization via 5 Fr right femoral artery access  Native Coronary Angiography  Internal Mammary Graft Angiography  Saphenous Vein Graft Angiography  Indication(s): unstable angina, prior CAD  History: 78 y.o. male with a history of 3V CABG (LIMA to LAD, SVG to OM1 and OM2) who had an ungrafted RCA. He originally presented with exertional dyspnea. He was found to have an ostial RCA stenosis. He underwent Promus DES placement, however, the stent appeared to have fallen short of the ostium.  He had some improvement in his symptoms however re-presented earlier this year with similar symptoms. A NST showed reversible inferior ischemia. He underwent catheterization and was found to have a short (5-10 mm segment of ostial stenosis) which was not stented.  He underwent cutting balloon angioplasty with resolution of his symptoms. A few weeks ago, he developed the exact same symptoms again and is referred for [facility] and possible PCI.  Consent: The procedure with Risks/Benefits/Alternatives and Indications was reviewed with the patient (and family).  All questions were answered.    Risks / Complications include, but not limited to: Death, MI, CVA/TIA, VF/VT (with defibrillation), Bradycardia (need for temporary pacer placement), contrast induced nephropathy, bleeding / bruising / hematoma / pseudoaneurysm, vascular or coronary injury (with possible emergent CT or Vascular Surgery), adverse medication reactions, infection.    The patient (and family) voice understanding and agree to proceed.    Risks of procedure as well as the alternatives and risks of each were explained to the (patient/caregiver).  Consent for procedure  obtained. Consent for signed by MD and patient with RN witness -- placed on chart.  Procedure: The patient was brought to the 2nd [facility] Cardiac Catheterization Lab in the fasting state and prepped and draped in the usual sterile fashion for (Right groin) access.   Sterile technique was used including antiseptics, cap, gloves, gown, hand hygiene, mask and sheet.  Skin prep: Chlorhexidine;  Time Out: Verified patient identification, verified procedure, site/side was marked, verified correct patient position, special equipment/implants available, medications/allergies/relevent history reviewed, required imaging and test results available.  Performed  The right femoral head was identified using tactile and fluoroscopic technique.  The right groin was anesthetized with 1% subcutaneous Lidocaine.  The right Common Femoral Artery was accessed using the Modified Seldinger Technique with placement of (5 Fr) sheath using the Seldinger technique.  The sheath was aspirated and flushed.  A 5 Fr JL4 Catheter was advanced of over a Standard J wire into the ascending Aorta.  The catheter was used to engage the left coronary artery.  Multiple cineangiographic views of the left coronary artery system(s) were performed. A 5 Fr JR4 Catheter was advanced of over a Safety J wire into the ascending Aorta.  The catheter was used to engage the right coronary artery and bypass grafts.  Multiple cineangiographic views of the right coronary artery system(s) and bypass grafts were performed. The catheter was then exchanged for a 33F IM catheter which was used to selectively engage the LIMA. Multiple hand injections were made. This catheter was then exchanged over the Standard J wire for an angled Pigtail catheter that was advanced across the Aortic Valve.  LV hemodynamics were measured.  LV hemodynamics were then re-sampled, and the catheter was pulled back  across the Aortic Valve for measurement of "pull-back" gradient.   The catheter and the wire was removed completely out of the body.  At this point, it was determined the patient needed coronary intervention. The sheath was upgraded over a wire for 37F and I contacted my partner Dr. Ellyn Hack to review the cath films and plan for coronary intervention.  The patient was transported to the cath lab holding area in stable condition.   The patient  was stable before, during and following the procedure.   Patient did tolerate procedure well. There were not complications.  EBL:10 cc  Medications:  Premedication: none  Sedation: none  Contrast:  75 cc Omnipaque  Hemodynamics:  Central Aortic Pressure / Mean Aortic Pressure: 131/79  LV Pressure / LV End diastolic Pressure:  17  Coronary Angiographic Data:  Left Main:  Diffuse tapering disease, highly calcified. Moderate to severe distal stenosis.  Left Anterior Descending (LAD):  Occluded at the mid-vessel, bypassed distally by the LIMA.  1st diagonal (D1):  Small vessel with moderate disease.  Circumflex (LCx):  There is a proximal stent at which the vessel is occluded.  1st obtuse marginal:  This has moderate disease and fills distally from an SVG graft.  2nd obtuse marginal:  Larger vessel which fills distally from an SVG graft.  Right Coronary Artery: Dominant vessel with a patent proximal RCA stent. There is severe (90+%) stenosis of the ostial RCA in an unstented segment of the vessel.  posterior descending artery: Mild to moderate disease.  posterior lateral branch:  Mild diffuse disease.  Grafts  LIMA - LAD: Widely patent with good distal runoff.  SVG - OM1: Widely patent with TIMI III flow and good distal runoff.  SVG - OM2: Widely patent with TIMI III flow and good distal runoff.  Impression: 1.  Severe ostial RCA stenosis in an un-stented area. 2.  Patent LIMA-LAD, SVG to OM1 and OM2 with no significant change since last cath. 3.  LVEDP = 17 mmHg  Plan: 1.  Discussed with Dr.  Ellyn Hack, will plan PCI with a DES to the ostial RCA, flared into the aortic root.  The case and results was discussed with the patient (and family). The case and results was not discussed with the patient's PCP. The case and results was discussed with the patient's Cardiologist.  Time Spend Directly with Patient:  62 minutes  Steven Casino, MD, [facility] Attending Cardiologist [facility] [place] [facility] 06/10/2012, 10:45 AM

## 2012-06-10 NOTE — CV Procedure (Signed)
[facility]     CARDIAC CATHETERIZATION REPORT  NAME: [name]  MRN: [id number] DOB: 1933-10-31  ADMIT DATE:   Performing Cardiologist: [id number] W  Primary Physician: [name], MD Primary Cardiologist:  [name],[name], MD  Procedures Performed:  Percutaneous Coronary Artery Intervention on Ostial RCA with a Promus Element DES 3.5 mm x 12 mm; final diameter 4.0 mm at ostium and 3.75 mm at sten overlap.  Femoral Artery closure with Mynx Closure device.  Indication(s): Unstable Angina with known recent PTCA  History: 78 y.o. male with a history of 3V CABG (LIMA to LAD, SVG to OM1 and OM2) who had an ungrafted RCA. He originally presented with exertional dyspnea. He was found to have an ostial RCA stenosis. He underwent Promus DES placement, however, the stent appeared to have fallen short of the ostium. He had some improvement in his symptoms however re-presented earlier this year with similar symptoms. A NST showed reversible inferior ischemia. He underwent catheterization and was found to have a short (5-10 mm segment of ostial stenosis) which was not stented. He underwent cutting balloon angioplasty with resolution of his symptoms. A few weeks ago, he developed the exact same symptoms again and is referred for [facility] and possible PCI. He underwent Diagnostic catheterization by Dr. Debara Pickett today that demonstrated severe (90+%) stenosis of the ostial RCA in an unstented segment of the vessel.  I was asked to intervene on this segment.   Previous attempt at Cutting Balloon PTCA proved unsuccessful, as it appears that the lesion extends to the vessel ostium that was not covered by the stent.   This will require ostial stenting with a small portion of the stent "hanging out" in the Aorta.  Time Out: Verified patient identification, verified procedure, site/side was marked, verified correct patient position, special equipment/implants available,  medications/allergies/relevent history reviewed, required imaging and test results available.  Performed  PERCUTANEOUS CORONARY INTERVENTION PROCEDURE  After reviewing the initial cineangiography images, the culprit lesion was identified to be proximal stent ISR involving the RCA ostium, and the decision was made to proceed with percutaneous coronary intervention.  The 5Fr Sheath was exchanged over a wire for a 6Fr sheath.   A weight based bolus of IV Angiomax was administered and the drip was continued until completion of the procedure. The patient is currently on Brilinta was administered.  The Guide catheter(s) were advanced over a J-wire and used to engage the right Coronary Artery . An ACT of > 200 Sec was confirmed prior to advancing the Guidewire.  Lesion #1:  Ostial RCA 99% with promimal stent ISR  Pre-PCI Stenosis: 99 % Post-PCI Stenosis: 0 %     TIMI 3 flow       TIMI 3 flow  Guide Catheter: 3DRC    Guidewire: BMW  Pre-Dilitation Balloon: Angiosculpt PTCA Scoring Balloon 3.0 mm x 12 mm  1st Inflation:  10 Atm for 45 Sec  2nd Inflation:  10 Atm for 30 Sec Scout angiography did not reveal evidence of dissection or perforation, but residual ostial lesion remained.  STENT: Promus Element DES 3.5 mm x 12 mm (overlapping proximal stent & ~into the Aorto-ostium)   Care was taken to ensure that the vessel ostium was stented, which required a short segment to remain in the aorta.  1st Inflation: 12 Atm for 30 Sec  2nd Inflation:  16 Atm for 60 Sec  Final diameter at overlap segment ~3.75 mm  Post-Dilitation Balloon: [place] Trek 4.0 mm x  8 mm  1st Inflation: 14 Atm for 60 Sec - proximal stent  2nd Inflation: 73 Atm for 71 Sec - vessel ostium to "flare out" the overhanging stent. Post-deployment cineangiography with and without guidewire in place was performed in multiple orthogonal views demonstrating excellent stent deployment with short overhang into the Aorta.  There was no  evidence of dissection or perforation.  Wire out angiography was non-selective.  After a selective Right Femoral Angiogram confirmed excellent access location and anatomy for closure, the arteriotomy was closed using a 6Fr Mynx closure device.  Excellent hemostasis was noted, but the patient did have a mild Vaso-vagal response requiring 0.5 mg of Atropine to return HR to normal rate.   He otherwise remained hemodynamically stable.  The patient was transported to the PACU holding area in a hemodynamically stable, chest pain free condition.   The patient  was stable before, during and following the procedure.   Patient did tolerate procedure well. There were not complications.  EBL: < 10  Medications: Angiomax Bolus & gtt during PCI  Contrast:  Total 205 ml Omnipaquefor Diagnostic & PCI   Atropine 0.5 mg IV  Impression:   Successful PCI on proximal RCA Stent ISR with associated Ostial RCA lesion ~99% reduced to 0% with a Promus Element DES 3.5 mm x 12 mm - post dilated as noted above  Future angiography of the RCA may be difficult due to the need to overhand the stent out of the RCA ostium.  Non-selective imaging was adequate.  Plan:  Standard post PCI care   Anticipate D/c in AM on indefinite DAPT   Will f/u with Dr. Debara Pickett, who was present during this procedure.  The case and results was discussed with the patient (and family). The case and results was not discussed with the patient's PCP. The case and results was discussed with the patient's Cardiologist.  Time Spend Directly with Patient:  60 minutes  [name], M.D., M.S. [facility] [address] [place], [place]  [id number]  [phone]  06/10/2012 12:03 PM

## 2012-06-11 LAB — CBC
Hemoglobin: 11.8 g/dL — ABNORMAL LOW (ref 13.0–17.0)
MCH: 29.8 pg (ref 26.0–34.0)
RBC: 3.96 MIL/uL — ABNORMAL LOW (ref 4.22–5.81)

## 2012-06-11 LAB — BASIC METABOLIC PANEL
CO2: 22 mEq/L (ref 19–32)
Calcium: 9 mg/dL (ref 8.4–10.5)
Glucose, Bld: 93 mg/dL (ref 70–99)
Potassium: 4.5 mEq/L (ref 3.5–5.1)
Sodium: 138 mEq/L (ref 135–145)

## 2012-06-11 MED FILL — Dextrose Inj 5%: INTRAVENOUS | Qty: 50 | Status: AC

## 2012-06-11 NOTE — Progress Notes (Signed)
CARDIAC REHAB PHASE I   PRE:  Rate/Rhythm: 58 SB    BP: sitting 130/66    SaO2:   MODE:  Ambulation: 1000 ft   POST:  Rate/Rhythm: 91 SR    BP: sitting 123/76     SaO2:   Tolerated well. No c/o. Walks several times a week and stays active. Reviewed ed. Thinking about CRPII and requests his name be sent to [facility]. [id number]  Darrick Meigs CES, ACSM

## 2012-06-11 NOTE — Progress Notes (Signed)
[facility]  Subjective: Doing well with no complaints.  Objective: Vital signs in last 24 hours: Temp:  [97.6 F (36.4 C)-98.3 F (36.8 C)] 98.3 F (36.8 C) (10/25 0738) Pulse Rate:  [52-67] 67  (10/25 0738) Resp:  [10-25] 10  (10/25 0738) BP: (111-137)/(48-71) 126/67 mmHg (10/25 0738) SpO2:  [93 %-100 %] 93 % (10/25 0738) Weight:  [79.3 kg (174 lb 13.2 oz)] 79.3 kg (174 lb 13.2 oz) (10/25 0022) Last BM Date: 06/09/12  Intake/Output from previous day: 10/24 0701 - 10/25 0700 In: 240.9 [I.V.:240.9] Out: 1175 [Urine:1175] Intake/Output this shift:    Medications Current Facility-Administered Medications  Medication Dose Route Frequency Provider Last Rate Last Dose  . 0.9 %  sodium chloride infusion  1 mL/kg/hr Intravenous Continuous Leonie Man, MD      . 0.9 %  sodium chloride infusion  250 mL Intravenous Continuous Leonie Man, MD      . acetaminophen (TYLENOL) tablet 650 mg  650 mg Oral Q4H PRN Leonie Man, MD      . ALPRAZolam Duanne Moron) tablet 0.25 mg  0.25 mg Oral TID PRN Erlene Quan, PA      . alum & mag hydroxide-simeth (MAALOX/MYLANTA) 200-200-20 MG/5ML suspension 30 mL  30 mL Oral Q6H PRN Cecilie Kicks, NP      . amLODipine (NORVASC) tablet 10 mg  10 mg Oral Daily Cecilie Kicks, NP   10 mg at 06/10/12 2200  . aspirin EC tablet 81 mg  81 mg Oral Daily Cecilie Kicks, NP   81 mg at 06/09/12 2232  . atropine 1 MG/ML injection           . bivalirudin (ANGIOMAX) 250 MG injection           . ezetimibe (ZETIA) tablet 10 mg  10 mg Oral Daily Cecilie Kicks, NP   10 mg at 06/10/12 2159  . heparin 2-0.9 UNIT/ML-% infusion           . influenza  inactive virus vaccine (FLUZONE/FLUARIX) injection 0.5 mL  0.5 mL Intramuscular Tomorrow-1000 Pixie Casino, MD      . isosorbide mononitrate (IMDUR) 24 hr tablet 60 mg  60 mg Oral Daily Cecilie Kicks, NP   60 mg at 06/10/12 2159  . lidocaine (XYLOCAINE) 1 % injection           . metoprolol  tartrate (LOPRESSOR) tablet 25 mg  25 mg Oral BID Cecilie Kicks, NP   25 mg at 06/10/12 2159  . morphine 2 MG/ML injection 1 mg  1 mg Intravenous Q1H PRN Leonie Man, MD      . niacin (NIASPAN) CR tablet 500 mg  500 mg Oral QHS Cecilie Kicks, NP   500 mg at 06/10/12 2158  . nitroGLYCERIN (NITROSTAT) SL tablet 0.4 mg  0.4 mg Sublingual Q5 min PRN Cecilie Kicks, NP      . nitroGLYCERIN (NTG ON-CALL) 0.2 mg/mL injection           . omega-3 acid ethyl esters (LOVAZA) capsule 1 g  1 g Oral TID Pixie Casino, MD   1 g at 06/10/12 2158  . ondansetron (ZOFRAN) injection 4 mg  4 mg Intravenous Q6H PRN Leonie Man, MD      . rosuvastatin (CRESTOR) tablet 20 mg  20 mg Oral q1800 Pixie Casino, MD   20 mg at 06/10/12 1800  . sodium chloride 0.9 % injection 3 mL  3 mL Intravenous Q12H  Cecilie Kicks, NP      . sodium chloride 0.9 % injection 3 mL  3 mL Intravenous Q12H Leonie Man, MD      . sodium chloride 0.9 % injection 3 mL  3 mL Intravenous PRN Leonie Man, MD      . Ticagrelor [facility]) tablet 90 mg  90 mg Oral BID Cecilie Kicks, NP   90 mg at 06/10/12 2158  . traMADol (ULTRAM) tablet 50 mg  50 mg Oral Q6H PRN Erlene Quan, PA      . zolpidem (AMBIEN) tablet 5 mg  5 mg Oral QHS PRN Cecilie Kicks, NP      . DISCONTD: 0.9 %  sodium chloride infusion   Intravenous Continuous Cecilie Kicks, NP 50 mL/hr at 06/09/12 1315    . DISCONTD: 0.9 %  sodium chloride infusion  250 mL Intravenous PRN Cecilie Kicks, NP      . DISCONTD: 0.9 %  sodium chloride infusion  1 mL/kg/hr Intravenous Continuous Cecilie Kicks, NP 80.3 mL/hr at 06/10/12 0407 1 mL/kg/hr at 06/10/12 0407  . DISCONTD: acetaminophen (TYLENOL) suppository 650 mg  650 mg Rectal Q6H PRN Cecilie Kicks, NP      . DISCONTD: acetaminophen (TYLENOL) tablet 650 mg  650 mg Oral Q6H PRN Cecilie Kicks, NP      . DISCONTD: ondansetron [facility]) injection 4 mg  4 mg Intravenous Q6H PRN Cecilie Kicks, NP      . DISCONTD: ondansetron (ZOFRAN) tablet 4 mg  4  mg Oral Q6H PRN Cecilie Kicks, NP      . DISCONTD: sodium chloride 0.9 % injection 3 mL  3 mL Intravenous Q12H Cecilie Kicks, NP      . DISCONTD: sodium chloride 0.9 % injection 3 mL  3 mL Intravenous PRN Cecilie Kicks, NP       Facility-Administered Medications Ordered in Other Encounters  Medication Dose Route Frequency Provider Last Rate Last Dose  . sodium chloride 0.9 % injection 3 mL  3 mL Intravenous PRN Pixie Casino, MD        PE: General appearance: alert, cooperative and no distress Lungs: clear to auscultation bilaterally Heart: regular rate and rhythm and 2/6 sys MM Extremities: No LEE Pulses: 2+ and symmetric Skin: Warm and dry.  Minimal ecchymosis at cath site.  nontender.  no hematoma.   Neurologic: Grossly normal  Lab Results:   Basename 06/11/12 0520 06/09/12 1621 06/09/12 1406  WBC 7.0 5.2 5.1  HGB 11.8* 10.4* 11.2*  HCT 35.2* 31.0* 32.9*  PLT 160 138* 151   BMET  Basename 06/11/12 0520 06/10/12 0530 06/09/12 1621  NA 138 139 137  K 4.5 4.6 4.1  CL 107 109 104  CO2 22 22 24   GLUCOSE 93 99 148*  BUN 22 25* 29*  CREATININE 1.46* 1.39* 1.74*  CALCIUM 9.0 8.8 8.9   PT/INR  Basename 06/09/12 1406  LABPROT 14.4  INR 1.14    Assessment/Plan   Principal Problem:  *Crescendo angina Active Problems:  HTN (hypertension),  CKD (chronic kidney disease) stage 3, GFR 30-59 ml/min  Aortic stenosis, moderate  Pulmonary hypertension, mild  Recurrent angina status post rotational atherectomy, with PTCA for ISR to RCA, 12/22/2011.  Plan:  S/P successful PCI on proximal RCA Stent ISR with associated Ostial RCA lesion ~99% reduced to 0% with a Promus Element DES 3.5 mm x 12 mm - post dilated as noted above.   SCr. Stable.  BP and HR stable and controlled.  There was  some oozing from the cath site but stable now.  DC home today.      LOS: 2 days    HAGER, BRYAN 06/11/2012 9:07 AM  I have seen and examined the patient along with Tarri Fuller, PA.  I have  reviewed the chart, notes and new data.  I agree with PA's note.  No access site problems. Ecchymosis is small (< 2 cm). Asymptomatic. DC home  Sanda Klein, MD, [facility] [phone] 06/11/2012, 12:15 PM

## 2012-06-12 NOTE — Discharge Summary (Signed)
Physician Discharge Summary  Patient ID: [name] MRN: [id number] DOB/AGE: 12-02-1933 78 y.o.  Admit date: 06/09/2012 Discharge date: 06/12/2012  Admission Diagnoses:    Discharge Diagnoses:  Principal Problem:  *Crescendo angina Active Problems:  HTN (hypertension),  CKD (chronic kidney disease) stage 3, GFR 30-59 ml/min  Aortic stenosis, moderate  Pulmonary hypertension, mild  Recurrent angina status post rotational atherectomy, with PTCA for ISR to RCA, 12/22/2011.   Discharged Condition: stable  Hospital Course:   78 year old patient of Dr. Lysbeth Penner that is being admitted for IV hydration prior to cardiac catheterization scheduled for 06/10/2012. Patient has a history of coronary disease with bypass grafting in 1996 and recent PCI with stent to the ostial RCA January 2013 which is an ungrafted vessel. He then developed ostial restenosis of the RCA and underwent balloon atherectomy with Cutting Balloon angioplasty to the ostial RCA in May of 2013. Symptoms markedly improved after this procedure.   Recently he has had increased episodes of shortness of breath and chest pain very much like symptoms prior to this last Cutting Balloon angioplasty. He was originally treated medically with increasing IMDUR to 60 mg daily which did not improve his symptoms. Dr. Debara Pickett felt re-cathing was the best option to evaluate his symptoms. This is most likely reocclusion of the RCA ostially.   Other history does include renal disease followed by Dr. Justin Mend. Losartan/hct was recently stopped by Dr. Justin Mend secondary to worsening kidney function. Patient is now brought in 24 hours in advance for IV hydration prior to his cardiac catheterization.  Patient also has aortic stenosis with valve area of 1.4-1.5 moderate aortic stenosis. EKG sinus bradycardia left bundle branch block and a no ischemic changes on office visit.  Left heart cath revealed Severe ostial RCA stenosis in an un-stented area. Patent LIMA-LAD,  SVG to OM1 and OM2 with no significant change since last cath.  LVEDP = 17 mmHg.  He underwent successful PCI on proximal RCA Stent ISR with associated Ostial RCA lesion ~99% reduced to 0% with a Promus Element DES 3.5 mm x 12 mm -Future angiography of the RCA may be difficult due to the need to overhand the stent out of the RCA ostium. Non-selective imaging was adequate.  He was seen by Dr. Sallyanne Kuster who felt he was stable for DC home.  Consults: None  Significant Diagnostic Studies:   Hemodynamics:  Central Aortic Pressure / Mean Aortic Pressure: 131/79  LV Pressure / LV End diastolic Pressure: 17  Coronary Angiographic Data:  Left Main: Diffuse tapering disease, highly calcified. Moderate to severe distal stenosis.  Left Anterior Descending (LAD): Occluded at the mid-vessel, bypassed distally by the LIMA.  1st diagonal (D1): Small vessel with moderate disease.  Circumflex (LCx): There is a proximal stent at which the vessel is occluded.  1st obtuse marginal: This has moderate disease and fills distally from an SVG graft.  2nd obtuse marginal: Larger vessel which fills distally from an SVG graft.  Right Coronary Artery: Dominant vessel with a patent proximal RCA stent. There is severe (90+%) stenosis of the ostial RCA in an unstented segment of the vessel.  posterior descending artery: Mild to moderate disease.  posterior lateral branch: Mild diffuse disease. Grafts  LIMA - LAD: Widely patent with good distal runoff.  SVG - OM1: Widely patent with TIMI III flow and good distal runoff.  SVG - OM2: Widely patent with TIMI III flow and good distal runoff. Impression:  1. Severe ostial RCA stenosis in an un-stented area.  2. Patent LIMA-LAD, SVG to OM1 and OM2 with no significant change since last cath.  3. LVEDP = 17 mmHg  Plan:  1. Discussed with Dr. Ellyn Hack, will plan PCI with a DES to the ostial RCA, flared into the aortic root.  The case and results was discussed with the patient (and  family).  The case and results was not discussed with the patient's PCP.  The case and results was discussed with the patient's Cardiologist.  Time Spend Directly with Patient:  76 minutes  Pixie Casino, MD, [facility]  Attending Cardiologist  [place]  [name],Kenneth C  Lesion #1: Ostial RCA 99% with promimal stent ISR  Pre-PCI Stenosis: 99 % Post-PCI Stenosis: 0 %  TIMI 3 flow TIMI 3 flow  Guide Catheter: 3DRC Guidewire: BMW  Pre-Dilitation Balloon: Angiosculpt PTCA Scoring Balloon 3.0 mm x 12 mm  1st Inflation: 10 Atm for 63 Sec  2nd Inflation: 10 Atm for 30 Sec Scout angiography did not reveal evidence of dissection or perforation, but residual ostial lesion remained.  STENT: Promus Element DES 3.5 mm x 12 mm (overlapping proximal stent & ~into the Aorto-ostium)  Care was taken to ensure that the vessel ostium was stented, which required a short segment to remain in the aorta. 1st Inflation: 12 Atm for 30 Sec  2nd Inflation: 16 Atm for 60 Sec  Final diameter at overlap segment ~3.75 mm Post-Dilitation Balloon: [place] Trek 4.0 mm x 8 mm  1st Inflation: 14 Atm for 60 Sec - proximal stent  2nd Inflation: 39 Atm for 52 Sec - vessel ostium to "flare out" the overhanging stent. Post-deployment cineangiography with and without guidewire in place was performed in multiple orthogonal views demonstrating excellent stent deployment with short overhang into the Aorta. There was no evidence of dissection or perforation. Wire out angiography was non-selective.  After a selective Right Femoral Angiogram confirmed excellent access location and anatomy for closure, the arteriotomy was closed using a 6Fr Mynx closure device. Excellent hemostasis was noted, but the patient did have a mild Vaso-vagal response requiring 0.5 mg of Atropine to return HR to normal rate.  He otherwise remained hemodynamically stable.  The patient was transported to the PACU holding area in a  hemodynamically stable, chest pain free condition.  The patient was stable before, during and following the procedure.  Patient did tolerate procedure well.  There were not complications.  EBL: < 10  Medications: Angiomax Bolus & gtt during PCI  Contrast: Total 205 ml Omnipaquefor Diagnostic & PCI  Atropine 0.5 mg IV  Impression:  Successful PCI on proximal RCA Stent ISR with associated Ostial RCA lesion ~99% reduced to 0% with a Promus Element DES 3.5 mm x 12 mm - post dilated as noted above  Future angiography of the RCA may be difficult due to the need to overhand the stent out of the RCA ostium. Non-selective imaging was adequate. Plan:  Standard post PCI care  Anticipate D/c in AM on indefinite DAPT  Will f/u with Dr. Debara Pickett, who was present during this procedure. The case and results was discussed with the patient (and family).  The case and results was not discussed with the patient's PCP.  The case and results was discussed with the patient's Cardiologist.  Time Spend Directly with Patient:  60 minutes  HARDING,DAVID W, M.D., M.S.  [facility]  [address] [place],  [address]  [phone]  06/10/2012  BMET  Component Value Date/Time   NA 138 06/11/2012 0520   K 4.5 06/11/2012 0520   CL 107 06/11/2012 0520   CO2 22 06/11/2012 0520   GLUCOSE 93 06/11/2012 0520   BUN 22 06/11/2012 0520   CREATININE 1.46* 06/11/2012 0520   CALCIUM 9.0 06/11/2012 0520   GFRNONAA 45* 06/11/2012 0520   GFRAA 52* 06/11/2012 0520    CBC    Component Value Date/Time   WBC 7.0 06/11/2012 0520   RBC 3.96* 06/11/2012 0520   HGB 11.8* 06/11/2012 0520   HCT 35.2* 06/11/2012 0520   PLT 160 06/11/2012 0520   MCV 88.9 06/11/2012 0520   MCH 29.8 06/11/2012 0520   MCHC 33.5 06/11/2012 0520   RDW 14.2 06/11/2012 0520   LYMPHSABS 1.1 06/09/2012 1406   MONOABS 0.3 06/09/2012 1406   EOSABS 0.2 06/09/2012 1406   BASOSABS 0.0 06/09/2012 1406    Lipid Panel     Component Value Date/Time   CHOL  Value: 119        ATP III CLASSIFICATION:  <200     mg/dL   Desirable  200-239  mg/dL   Borderline High  >=240    mg/dL   High 04/25/2007 0415   TRIG 124 04/25/2007 0415   HDL 45 04/25/2007 0415   CHOLHDL 2.6 04/25/2007 0415   VLDL 25 04/25/2007 0415   LDLCALC  Value: 49        Total Cholesterol/HDL:CHD Risk Coronary Heart Disease Risk Table                     Men   Women  1/2 Average Risk   3.4   3.3 04/25/2007 0415   Treatments:   Discharge Exam: Blood pressure 124/58, pulse 57, temperature 97.8 F (36.6 C), temperature source Oral, resp. rate 27, height 5\' 9"  (1.753 m), weight 79.3 kg (174 lb 13.2 oz), SpO2 99.00%.   Disposition: 01-Home or Self Care  Discharge Orders    Future Appointments: Provider: Department: Dept Phone: Center:   12/28/2012 9:00 AM Hayden Pedro, MD [facility] [phone] None   06/06/2013 10:30 AM [facility] [phone] [facility]   06/06/2013 11:40 AM Princess Perna, NP [facility] [phone] [facility]     Future Orders Please Complete By Expires   Amb Referral to Cardiac Rehabilitation      Diet - low sodium heart healthy      Increase activity slowly      Discharge instructions      Comments:   No driving or lifting more than a half gallon of milk for three days.       Medication List     As of 06/12/2012  1:19 PM    TAKE these medications         amLODipine 10 MG tablet   Commonly known as: NORVASC   Take 1 tablet (10 mg total) by mouth daily.      aspirin EC 81 MG tablet   Take 81 mg by mouth daily.      ezetimibe 10 MG tablet   Commonly known as: ZETIA   Take 10 mg by mouth daily.      fish oil-omega-3 fatty acids 1000 MG capsule   Take 1 g by mouth 3 (three) times daily.      isosorbide mononitrate 60 MG 24 hr tablet   Commonly known as: IMDUR   Take 60 mg by mouth daily.      metoprolol tartrate 25 MG  tablet   Commonly known as: LOPRESSOR   Take 25 mg by  mouth 2 (two) times daily.      niacin 500 MG CR tablet   Commonly known as: NIASPAN   Take 500 mg by mouth at bedtime.      nitroGLYCERIN 0.4 MG SL tablet   Commonly known as: NITROSTAT   Place 1 tablet (0.4 mg total) under the tongue every 5 (five) minutes as needed for chest pain.      rosuvastatin 20 MG tablet   Commonly known as: CRESTOR   Take 20 mg by mouth daily.      Ticagrelor 90 MG Tabs tablet   Commonly known as: BRILINTA   Take 1 tablet (90 mg total) by mouth 2 (two) times daily.         SignedTarri Fuller 06/12/2012, 1:19 PM

## 2012-07-21 ENCOUNTER — Other Ambulatory Visit: Payer: Self-pay | Admitting: Cardiovascular Disease

## 2012-07-21 ENCOUNTER — Other Ambulatory Visit (HOSPITAL_COMMUNITY): Payer: Self-pay | Admitting: Vascular Surgery

## 2012-07-21 ENCOUNTER — Ambulatory Visit (HOSPITAL_COMMUNITY)
Admission: RE | Admit: 2012-07-21 | Discharge: 2012-07-21 | Disposition: A | Discharge status: Home / Self Care | Payer: Medicare Other | Source: Ambulatory Visit | Attending: Internal Medicine | Admitting: Internal Medicine

## 2012-07-21 DIAGNOSIS — R079 Chest pain, unspecified: Secondary | ICD-10-CM

## 2012-07-21 DIAGNOSIS — Z8249 Family history of ischemic heart disease and other diseases of the circulatory system: Secondary | ICD-10-CM | POA: Insufficient documentation

## 2012-07-21 DIAGNOSIS — E785 Hyperlipidemia, unspecified: Secondary | ICD-10-CM | POA: Insufficient documentation

## 2012-07-21 DIAGNOSIS — I447 Left bundle-branch block, unspecified: Secondary | ICD-10-CM | POA: Insufficient documentation

## 2012-07-21 DIAGNOSIS — I10 Essential (primary) hypertension: Secondary | ICD-10-CM | POA: Insufficient documentation

## 2012-07-21 DIAGNOSIS — I2510 Atherosclerotic heart disease of native coronary artery without angina pectoris: Secondary | ICD-10-CM | POA: Insufficient documentation

## 2012-07-21 MED ORDER — TECHNETIUM TC 99M SESTAMIBI GENERIC - CARDIOLITE
30.6000 | Freq: Once | INTRAVENOUS | Status: AC | PRN
  Administered 2012-07-21: 31 via INTRAVENOUS

## 2012-07-21 MED ORDER — TECHNETIUM TC 99M SESTAMIBI GENERIC - CARDIOLITE
10.6000 | Freq: Once | INTRAVENOUS | Status: AC | PRN
  Administered 2012-07-21: 11 via INTRAVENOUS

## 2012-07-21 MED ORDER — REGADENOSON 0.4 MG/5ML IV SOLN
0.4000 mg | Freq: Once | INTRAVENOUS | Status: AC
  Administered 2012-07-21: 0.4 mg via INTRAVENOUS

## 2012-07-21 MED ORDER — AMINOPHYLLINE 25 MG/ML IV SOLN
75.0000 mg | Freq: Once | INTRAVENOUS | Status: AC
  Administered 2012-07-21: 75 mg via INTRAVENOUS

## 2012-07-21 NOTE — Procedures (Addendum)
[facility] [address] [place] [address] [id number]  Cardiology Nuclear Med Study  [name] is a 78 y.o. male     MRN : [id number]     DOB: 09-13-1933  Procedure Date: 07/21/2012  Nuclear Med Background Indication for Stress Test:  Stent Patency History:  CAD Cardiac Risk Factors: Family History - CAD, Hypertension and Lipids  Symptoms:  Chest Pain   Nuclear Pre-Procedure Caffeine/Decaff Intake:  10:00pm NPO After: 9:00am   IV Site: R Antecubital  IV 0.9% NS with Angio Cath:  22g  Chest Size (in):  42 IV Started by: Otho Perl, CNMT  Height: 5\' 9"  (1.753 m)  Cup Size: n/a  BMI:  Body mass index is 26.73 kg/(m^2). Weight:  181 lb (82.101 kg)   Tech Comments:  n/a    Nuclear Med Study 1 or 2 day study: 1 day  Stress Test Type:  [place] Provider:  Lyman Bishop, MD   Resting Radionuclide: Technetium 70m Sestamibi  Resting Radionuclide Dose: 10.6 mCi   Stress Radionuclide:  Technetium 79m Sestamibi  Stress Radionuclide Dose: 30.6 mCi           Stress Protocol Rest HR: 57 Stress HR: 81  Rest BP: 125/73 Stress BP: 152/82  Exercise Time (min): n/a METS: n/a   Predicted Max HR: 144 bpm % Max HR: 56.25 bpm Rate Pressure Product: 12312   Dose of Adenosine (mg):  n/a Dose of Lexiscan: 0.4 mg  Dose of Atropine (mg): n/a Dose of Dobutamine: n/a mcg/kg/min (at max HR)  Stress Test Technologist: Leane Para, CCT Nuclear Technologist: Imagene Riches, CNMT   Rest Procedure:  Myocardial perfusion imaging was performed at rest 45 minutes following the intravenous administration of Technetium 32m Sestamibi. Stress Procedure:  The patient received IV Lexiscan 0.4 mg over 15-seconds.  Technetium 34m Sestamibi injected at 30-seconds.  There were no significant changes with Lexiscan. Patient was given 75mg . Of aminophylline via IV for SOB.  Quantitative spect images were obtained after a 45  minute delay.  Transient Ischemic Dilatation (Normal <1.22):  1.05 Lung/Heart Ratio (Normal <0.45):  0.27 QGS EDV:  109 ml QGS ESV:  50 ml LV Ejection Fraction: 54%  Signed by     Rest ECG: NSR-LBBB  Stress ECG: No significant change from baseline ECG  QPS Raw Data Images:  Normal; no motion artifact; normal heart/lung ratio. Stress Images:  Normal homogeneous uptake in all areas of the myocardium. Rest Images:  Normal homogeneous uptake in all areas of the myocardium. Subtraction (SDS):  No evidence of ischemia.  Impression Exercise Capacity:  Lexiscan with no exercise. BP Response:  Normal blood pressure response. Clinical Symptoms:  No significant symptoms noted. ECG Impression:  Baseline:  LBBB.  EKG uninterpretable due to LBBB at rest and stress. Comparison with Prior Nuclear Study: No significant change from previous study  Overall Impression:  Normal stress nuclear study.  LV Wall Motion:  NL LV Function; NL Wall Motion   Lorretta Harp, MD  07/21/2012 5:27 PM

## 2012-08-31 ENCOUNTER — Other Ambulatory Visit: Payer: Self-pay | Admitting: Endocrinology

## 2012-08-31 DIAGNOSIS — M549 Dorsalgia, unspecified: Secondary | ICD-10-CM

## 2012-09-02 ENCOUNTER — Ambulatory Visit
Admission: RE | Admit: 2012-09-02 | Discharge: 2012-09-02 | Disposition: A | Discharge status: Home / Self Care | Payer: Medicare PPO | Source: Ambulatory Visit | Attending: Endocrinology | Admitting: Endocrinology

## 2012-09-02 DIAGNOSIS — M549 Dorsalgia, unspecified: Secondary | ICD-10-CM

## 2012-09-15 ENCOUNTER — Other Ambulatory Visit (HOSPITAL_COMMUNITY): Payer: Self-pay | Admitting: Endocrinology

## 2012-09-15 DIAGNOSIS — IMO0002 Reserved for concepts with insufficient information to code with codable children: Secondary | ICD-10-CM

## 2012-09-20 ENCOUNTER — Ambulatory Visit (HOSPITAL_COMMUNITY)
Admission: RE | Admit: 2012-09-20 | Discharge: 2012-09-20 | Disposition: A | Discharge status: Home / Self Care | Payer: Medicare PPO | Source: Ambulatory Visit | Attending: Endocrinology | Admitting: Endocrinology

## 2012-09-20 DIAGNOSIS — IMO0002 Reserved for concepts with insufficient information to code with codable children: Secondary | ICD-10-CM

## 2012-12-09 ENCOUNTER — Encounter: Payer: Self-pay | Admitting: Internal Medicine

## 2012-12-10 ENCOUNTER — Other Ambulatory Visit (HOSPITAL_COMMUNITY): Payer: Self-pay | Admitting: Cardiovascular Disease

## 2012-12-10 DIAGNOSIS — I2510 Atherosclerotic heart disease of native coronary artery without angina pectoris: Secondary | ICD-10-CM

## 2012-12-16 ENCOUNTER — Ambulatory Visit (HOSPITAL_COMMUNITY)
Admission: RE | Admit: 2012-12-16 | Discharge: 2012-12-16 | Disposition: A | Discharge status: Home / Self Care | Payer: Medicare PPO | Source: Ambulatory Visit | Attending: Cardiovascular Disease | Admitting: Cardiovascular Disease

## 2012-12-16 DIAGNOSIS — I2510 Atherosclerotic heart disease of native coronary artery without angina pectoris: Secondary | ICD-10-CM | POA: Insufficient documentation

## 2012-12-16 DIAGNOSIS — I350 Nonrheumatic aortic (valve) stenosis: Secondary | ICD-10-CM

## 2012-12-16 DIAGNOSIS — I359 Nonrheumatic aortic valve disorder, unspecified: Secondary | ICD-10-CM | POA: Insufficient documentation

## 2012-12-16 NOTE — Progress Notes (Signed)
[facility]   2D echo completed 12/16/2012.   Jamison Neighbor, RDCS

## 2012-12-28 ENCOUNTER — Ambulatory Visit (INDEPENDENT_AMBULATORY_CARE_PROVIDER_SITE_OTHER): Payer: Medicare PPO | Admitting: Ophthalmology

## 2012-12-28 DIAGNOSIS — H43819 Vitreous degeneration, unspecified eye: Secondary | ICD-10-CM

## 2012-12-28 DIAGNOSIS — I10 Essential (primary) hypertension: Secondary | ICD-10-CM

## 2012-12-28 DIAGNOSIS — H348392 Tributary (branch) retinal vein occlusion, unspecified eye, stable: Secondary | ICD-10-CM

## 2012-12-28 DIAGNOSIS — H35039 Hypertensive retinopathy, unspecified eye: Secondary | ICD-10-CM

## 2012-12-28 DIAGNOSIS — H2510 Age-related nuclear cataract, unspecified eye: Secondary | ICD-10-CM

## 2013-01-04 ENCOUNTER — Ambulatory Visit (INDEPENDENT_AMBULATORY_CARE_PROVIDER_SITE_OTHER): Payer: Medicare PPO | Admitting: Internal Medicine

## 2013-01-04 ENCOUNTER — Encounter: Payer: Self-pay | Admitting: Internal Medicine

## 2013-01-04 VITALS — BP 110/60 | HR 50 | Ht 69.5 in | Wt 185.3 lb

## 2013-01-04 DIAGNOSIS — I350 Nonrheumatic aortic (valve) stenosis: Secondary | ICD-10-CM

## 2013-01-04 DIAGNOSIS — I2510 Atherosclerotic heart disease of native coronary artery without angina pectoris: Secondary | ICD-10-CM

## 2013-01-04 DIAGNOSIS — I209 Angina pectoris, unspecified: Secondary | ICD-10-CM

## 2013-01-04 DIAGNOSIS — I359 Nonrheumatic aortic valve disorder, unspecified: Secondary | ICD-10-CM

## 2013-01-04 DIAGNOSIS — Z9889 Other specified postprocedural states: Secondary | ICD-10-CM

## 2013-01-04 DIAGNOSIS — I10 Essential (primary) hypertension: Secondary | ICD-10-CM

## 2013-01-04 DIAGNOSIS — I6529 Occlusion and stenosis of unspecified carotid artery: Secondary | ICD-10-CM

## 2013-01-04 NOTE — Patient Instructions (Addendum)
Your physician recommends that you schedule a follow-up appointment in: in 6 months

## 2013-01-04 NOTE — Progress Notes (Signed)
[place]          OFFICE NOTE   Chief Complaint:  Routine office visit, followup of echocardiogram results  Primary Care Physician: Steven Bolt, MD  HPI:  [name] is a 79 year old gentleman who had unfortunate aggressive restenosis of an RCA lesion in the past, recently restented. He presented with worsening anginal symptoms and medical therapy was recommended in November. He was scheduled for a nuclear stress test which he underwent on July 21, 2012, and this was negative for ischemia. Recently, he had been experiencing some soreness in his left chest wall which has been going on and off for about a month. He is not experiencing the shortness of breath that he previously experienced with his angina, especially when walking up hills. These symptoms have since resolved. Overall he feels well without any shortness of breath or chest discomfort. He believes that the increase in his Imdur were to 90 mg daily has helped him. He recently had a repeat echocardiogram for monitoring of aortic stenosis. The study demonstrated moderate aortic stenosis with a peak gradient between 35 and 37 mmHg and a mean gradient between 21 and 25 mmHg. The calculated aortic valve area is between 1.4 and 1.5 cm. There was mild aortic insufficiency and moderate tricuspid insufficiency, with a peak pulmonary pressure of 41 mmHg. Finally he did note a few episodes of epistaxis, which she is contributing to [name].  PMHx:  Past Medical History  Diagnosis Date  . Shortness of breath   . Chronic kidney disease   . Coronary artery disease   . Hypertension   . Stroke     hx of TIA  . Blood transfusion     "  no reaction to transfusion "  . Dysrhythmia     LLB  anf leaking valve per patient  . Dyspnea on exertion, anginal equivilant 10/16/2010    2D Echo - EF >55%, mild-moderate mitral regurgiation, mild-moderate tricuspid regurgitation, moderate calcification of the  aortic leaflets  . CAD (coronary artery disease), CABG 1996. 09/23/2011    2D Echo - EF >55%, moderate calcification of the aortic valve leaflets  . HTN (hypertension),uncontrolled 09/16/2011  . CKD (chronic kidney disease) stage 2, GFR 60-89 ml/min 09/16/2011  . Pulmonary hypertension, mild 09/17/2011  . Angina effort, 12/2011 with DOE anginal equivlent 12/22/2011  . Abnormal cardiovascular stress test, 11/2011 wint inferolateral reveribility 12/22/2011  . Recurrent angina status post rotational atherectomy, with PTCA for ISR to RCA, 12/22/2011 12/22/2011  . Carotid artery disease, Rt. CEA, 08/04/07, Lt. CEA 04/26/07. 12/23/2011  . Crescendo angina 06/09/2012  . CVA (cerebral vascular accident) 04/22/2007    2D Echo - EF 50-55%, aortic valve thickness mild-moderately increased,    Past Surgical History  Procedure Laterality Date  . Coronary artery bypass graft  08/04/2007  . Cardiac catheterization Left 06/10/2012    Ostial RCA 99% stenosis pre-dilation 3x104mm Angiosculpt PTCA balloon, 3.5x5mm Promus Element DES overlapping proximal stent and into the Aorto-ostium, post-dilated with4x69mm [place] Trek balloon - 99% stenosis reduced to 0%  . Coronary angioplasty    . Cea  2008  . Cardiac catheterization Left 12/22/2011    Left Main-diffuse tapering 50-60% stenosis; mild-moderate diseaseof the right ventricle branch of the right coronary artery; will plan FFR of ostial RCA  . Fractional flow reserve wire  12/22/2011    Ostial RCA, FFR ratio-0.82, physiologically significant, 3.25x67mm Flextome Cutting balloon deployed at ~3.81mm final estimated diameter  . Cardiac catheterization Bilateral  09/16/2011    Severe 90% ostial RCA disease which is calcified, will likely need PCI to ostial RCA with calcification  . Cardiac catheterization  09/18/2011    Calcified ostial 90-95% proximal RCA stenosis, 3x88mm Emerge balloon predilation, 3x63mm DES PROMUS Element stent was placed, post stent dilation done utilizing a 3.25x74mm  noncompliant Quantum Apex balloon, 90-95% stenosis reduced to 0%    FAMHx:  Family History  Problem Relation Age of Onset  . Cancer Mother   . Heart disease Mother   . Stroke Mother   . Alcohol abuse Father   . Depression Father   . Alzheimer's disease Father   . Heart disease Brother     Heart bypass in 1996  . Cancer Brother     Prostate    SOCHx:   reports that he has quit smoking. His smoking use included Cigarettes. He smoked 0.00 packs per day. He has quit using smokeless tobacco. He reports that he does not drink alcohol or use illicit drugs.  ALLERGIES:  Allergies  Allergen Reactions  . Atorvastatin Other (See Comments)    Muscle and joint pain, can tolerate rosuvastatin    ROS: A comprehensive review of systems was negative except for: Ears, nose, mouth, throat, and face: positive for epistaxis Musculoskeletal: positive for chest wall pain  HOME MEDS: Current Outpatient Prescriptions  Medication Sig Dispense Refill  . aspirin EC 81 MG tablet Take 81 mg by mouth daily.      Marland Kitchen ezetimibe (ZETIA) 10 MG tablet Take 10 mg by mouth daily.      . fish oil-omega-3 fatty acids 1000 MG capsule Take 1 g by mouth 3 (three) times daily.      . isosorbide mononitrate (IMDUR) 60 MG 24 hr tablet Take 90 mg by mouth daily. 1 1/2 tablet daily      . metoprolol tartrate (LOPRESSOR) 25 MG tablet Take 25 mg by mouth 2 (two) times daily.      . niacin (NIASPAN) 500 MG CR tablet Take 500 mg by mouth at bedtime.      . rosuvastatin (CRESTOR) 20 MG tablet Take 20 mg by mouth daily.      . Ticagrelor (BRILINTA) 90 MG TABS tablet Take 1 tablet (90 mg total) by mouth 2 (two) times daily.  60 tablet  0  . amLODipine (NORVASC) 10 MG tablet Take 1 tablet (10 mg total) by mouth daily.  30 tablet  5  . nitroGLYCERIN (NITROSTAT) 0.4 MG SL tablet Place 1 tablet (0.4 mg total) under the tongue every 5 (five) minutes as needed for chest pain.  25 tablet  2   No current facility-administered  medications for this visit.   Facility-Administered Medications Ordered in Other Visits  Medication Dose Route Frequency Provider Last Rate Last Dose  . sodium chloride 0.9 % injection 3 mL  3 mL Intravenous PRN Pixie Casino, MD        LABS/IMAGING: No results found for this or any previous visit (from the past 48 hour(s)). No results found.  VITALS: BP 110/60  Pulse 50  Ht 5' 9.5" (1.765 m)  Wt 185 lb 4.8 oz (84.052 kg)  BMI 26.98 kg/m2  EXAM: General appearance: alert and no distress Neck: no adenopathy, no carotid bruit, no JVD, supple, symmetrical, trachea midline and thyroid not enlarged, symmetric, no tenderness/mass/nodules Lungs: clear to auscultation bilaterally Heart: Occasionally irregular, normal S1 and S2, there is a 3/6 systolic murmur heard best at the right upper sternal border, no radiation  to the neck base Abdomen: soft, non-tender; bowel sounds normal; no masses,  no organomegaly Extremities: extremities normal, atraumatic, no cyanosis or edema Pulses: 2+ and symmetric Skin: Skin color, texture, turgor normal. No rashes or lesions Neurologic: Grossly normal  EKG: Not performed today  ASSESSMENT: 1. Moderate aortic stenosis with a valve area of 1.4-1.5 cm. 2. Coronary artery disease with multiple recent PCI's or in-stent restenosis of the right coronary artery. 3. Bilateral carotid endarterectomies in 2008 without recurrence 4. Chest wall pain 5. Hypertension 6. Is lipidemia 7. Left bundle branch block 8. CKD 3  PLAN: 1.   Overall [name] is doing very well. His shortness of breath especially with exertion has resolved. He continues to have some mild left upper chest and left shoulder pain with especially use of the left arm. He feels that the increase in his in door is helping him significantly. His echocardiogram shows stable valve gradients. I will plan to see him back in 6 months at which time we can consider possibly discontinuing his [place] to,  however given the history of multiple in-stent restenosis he is, I would strongly urge and to continue on antiplatelet agents, possibly Plavix.  He was given samples of Brillinta today.  Pixie Casino, MD, [facility] Attending Cardiologist [facility] 01/04/2013, 10:35 AM

## 2013-02-08 ENCOUNTER — Telehealth: Payer: Self-pay | Admitting: Internal Medicine

## 2013-02-08 NOTE — Telephone Encounter (Signed)
Would like some samples of Brilinta 90mg  please! Just let him know if you have them!

## 2013-02-08 NOTE — Telephone Encounter (Signed)
Samples left at front desk for pt pick up.  Lot: [id number] Exp: 01/2015.  Returned call and left message samples at front desk.

## 2013-02-14 ENCOUNTER — Other Ambulatory Visit: Payer: Self-pay | Admitting: *Deleted

## 2013-02-14 MED ORDER — ISOSORBIDE MONONITRATE ER 60 MG PO TB24: 90.0000 mg | ORAL_TABLET | Freq: Every day | ORAL | Status: DC

## 2013-02-14 NOTE — Telephone Encounter (Signed)
Refills authorized on isosorbide

## 2013-03-01 ENCOUNTER — Telehealth: Payer: Self-pay | Admitting: Internal Medicine

## 2013-03-01 NOTE — Telephone Encounter (Signed)
Would like some samples of Bralinta please!

## 2013-03-02 ENCOUNTER — Other Ambulatory Visit: Payer: Self-pay | Admitting: *Deleted

## 2013-03-02 MED ORDER — TICAGRELOR 90 MG PO TABS: 90.0000 mg | ORAL_TABLET | Freq: Two times a day (BID) | ORAL | Status: DC

## 2013-03-02 NOTE — Telephone Encounter (Signed)
Samples left at front desk for pt pick up.  Lot: [id number] Exp: 12/2014.   Returned call and pt informed samples left at front desk.  Pt verbalized understanding and agreed w/ plan.

## 2013-04-08 ENCOUNTER — Telehealth: Payer: Self-pay | Admitting: Internal Medicine

## 2013-04-08 MED ORDER — TICAGRELOR 90 MG PO TABS: 90.0000 mg | ORAL_TABLET | Freq: Two times a day (BID) | ORAL | Status: DC

## 2013-04-08 NOTE — Telephone Encounter (Signed)
Would like some samples of Brilinta  90 mgplease

## 2013-04-08 NOTE — Telephone Encounter (Signed)
Returned call and pt informed samples left at front desk.  Pt verbalized understanding and agreed w/ plan. Pt will pick up Monday.

## 2013-04-23 HISTORY — PX: OTHER SURGICAL HISTORY: SHX169

## 2013-04-24 ENCOUNTER — Emergency Department (HOSPITAL_COMMUNITY)
Admission: EM | Admit: 2013-04-24 | Discharge: 2013-04-24 | Disposition: A | Discharge status: Home / Self Care | Payer: Medicare PPO | Attending: Emergency Medicine | Admitting: Emergency Medicine

## 2013-04-24 ENCOUNTER — Emergency Department (HOSPITAL_COMMUNITY): Payer: Medicare PPO

## 2013-04-24 ENCOUNTER — Encounter (HOSPITAL_COMMUNITY): Payer: Self-pay | Admitting: Radiology

## 2013-04-24 DIAGNOSIS — Y9389 Activity, other specified: Secondary | ICD-10-CM | POA: Insufficient documentation

## 2013-04-24 DIAGNOSIS — Z8673 Personal history of transient ischemic attack (TIA), and cerebral infarction without residual deficits: Secondary | ICD-10-CM | POA: Insufficient documentation

## 2013-04-24 DIAGNOSIS — Z7982 Long term (current) use of aspirin: Secondary | ICD-10-CM | POA: Insufficient documentation

## 2013-04-24 DIAGNOSIS — N182 Chronic kidney disease, stage 2 (mild): Secondary | ICD-10-CM | POA: Insufficient documentation

## 2013-04-24 DIAGNOSIS — Z9861 Coronary angioplasty status: Secondary | ICD-10-CM | POA: Insufficient documentation

## 2013-04-24 DIAGNOSIS — Z951 Presence of aortocoronary bypass graft: Secondary | ICD-10-CM | POA: Insufficient documentation

## 2013-04-24 DIAGNOSIS — M2390 Unspecified internal derangement of unspecified knee: Secondary | ICD-10-CM | POA: Insufficient documentation

## 2013-04-24 DIAGNOSIS — Y9289 Other specified places as the place of occurrence of the external cause: Secondary | ICD-10-CM | POA: Insufficient documentation

## 2013-04-24 DIAGNOSIS — Z79899 Other long term (current) drug therapy: Secondary | ICD-10-CM | POA: Insufficient documentation

## 2013-04-24 DIAGNOSIS — I129 Hypertensive chronic kidney disease with stage 1 through stage 4 chronic kidney disease, or unspecified chronic kidney disease: Secondary | ICD-10-CM | POA: Insufficient documentation

## 2013-04-24 DIAGNOSIS — I2510 Atherosclerotic heart disease of native coronary artery without angina pectoris: Secondary | ICD-10-CM | POA: Insufficient documentation

## 2013-04-24 DIAGNOSIS — M2391 Unspecified internal derangement of right knee: Secondary | ICD-10-CM

## 2013-04-24 DIAGNOSIS — X500XXA Overexertion from strenuous movement or load, initial encounter: Secondary | ICD-10-CM | POA: Insufficient documentation

## 2013-04-24 DIAGNOSIS — Z87891 Personal history of nicotine dependence: Secondary | ICD-10-CM | POA: Insufficient documentation

## 2013-04-24 MED ORDER — HYDROMORPHONE HCL PF 1 MG/ML IJ SOLN
0.5000 mg | Freq: Once | INTRAMUSCULAR | Status: AC
  Administered 2013-04-24: 0.5 mg via INTRAVENOUS
  Filled 2013-04-24: qty 1

## 2013-04-24 MED ORDER — MORPHINE SULFATE 4 MG/ML IJ SOLN
4.0000 mg | Freq: Once | INTRAMUSCULAR | Status: AC
  Administered 2013-04-24: 4 mg via INTRAVENOUS
  Filled 2013-04-24: qty 1

## 2013-04-24 MED ORDER — OXYCODONE-ACETAMINOPHEN 5-325 MG PO TABS: 1.0000 | ORAL_TABLET | Freq: Four times a day (QID) | ORAL | Status: DC | PRN

## 2013-04-24 MED ORDER — OXYCODONE-ACETAMINOPHEN 5-325 MG PO TABS
1.0000 | ORAL_TABLET | Freq: Once | ORAL | Status: AC
  Administered 2013-04-24: 1 via ORAL
  Filled 2013-04-24: qty 1

## 2013-04-24 NOTE — ED Provider Notes (Signed)
CSN: [id number]     Arrival date & time 04/24/13  1044 History   First MD Initiated Contact with Patient 04/24/13 1101     Chief Complaint  Patient presents with  . Knee Pain   (Consider location/radiation/quality/duration/timing/severity/associated sxs/prior Treatment) Patient is a 79 y.o. male presenting with knee pain. The history is provided by the patient.  Knee Pain Location:  Knee Injury: yes   Associated symptoms: no fever    patient states that he was stepping off a tractor yesterday and felt a pop in his right knee. There is pain at that time but it has since increased. He is on Brilinta. No chest pain or trouble breathing. No numbness or weakness. He does have decreased use of the leg due to pain.  Past Medical History  Diagnosis Date  . Shortness of breath   . Chronic kidney disease   . Coronary artery disease   . Hypertension   . Stroke     hx of TIA  . Blood transfusion     "  no reaction to transfusion "  . Dysrhythmia     LLB  anf leaking valve per patient  . Dyspnea on exertion, anginal equivilant 10/16/2010    2D Echo - EF >55%, mild-moderate mitral regurgiation, mild-moderate tricuspid regurgitation, moderate calcification of the aortic leaflets  . CAD (coronary artery disease), CABG 1996. 09/23/2011    2D Echo - EF >55%, moderate calcification of the aortic valve leaflets  . HTN (hypertension),uncontrolled 09/16/2011  . CKD (chronic kidney disease) stage 2, GFR 60-89 ml/min 09/16/2011  . Pulmonary hypertension, mild 09/17/2011  . Angina effort, 12/2011 with DOE anginal equivlent 12/22/2011  . Abnormal cardiovascular stress test, 11/2011 wint inferolateral reveribility 12/22/2011  . Recurrent angina status post rotational atherectomy, with PTCA for ISR to RCA, 12/22/2011 12/22/2011  . Carotid artery disease, Rt. CEA, 08/04/07, Lt. CEA 04/26/07. 12/23/2011  . Crescendo angina 06/09/2012  . CVA (cerebral vascular accident) 04/22/2007    2D Echo - EF 50-55%, aortic valve thickness  mild-moderately increased,   Past Surgical History  Procedure Laterality Date  . Coronary artery bypass graft  08/04/2007  . Cardiac catheterization Left 06/10/2012    Ostial RCA 99% stenosis pre-dilation 3x31mm Angiosculpt PTCA balloon, 3.5x19mm Promus Element DES overlapping proximal stent and into the Aorto-ostium, post-dilated with4x49mm [place] Trek balloon - 99% stenosis reduced to 0%  . Coronary angioplasty    . Cea  2008  . Cardiac catheterization Left 12/22/2011    Left Main-diffuse tapering 50-60% stenosis; mild-moderate diseaseof the right ventricle branch of the right coronary artery; will plan FFR of ostial RCA  . Fractional flow reserve wire  12/22/2011    Ostial RCA, FFR ratio-0.82, physiologically significant, 3.25x61mm Flextome Cutting balloon deployed at ~3.56mm final estimated diameter  . Cardiac catheterization Bilateral 09/16/2011    Severe 90% ostial RCA disease which is calcified, will likely need PCI to ostial RCA with calcification  . Cardiac catheterization  09/18/2011    Calcified ostial 90-95% proximal RCA stenosis, 3x68mm Emerge balloon predilation, 3x62mm DES PROMUS Element stent was placed, post stent dilation done utilizing a 3.25x93mm noncompliant Quantum Apex balloon, 90-95% stenosis reduced to 0%   Family History  Problem Relation Age of Onset  . Cancer Mother   . Heart disease Mother   . Stroke Mother   . Alcohol abuse Father   . Depression Father   . Alzheimer's disease Father   . Heart disease Brother     Heart bypass in 1996  .  Cancer Brother     Prostate   History  Substance Use Topics  . Smoking status: Former Smoker    Types: Cigarettes  . Smokeless tobacco: Former Systems developer     Comment: rarely smoked when he did smoke   . Alcohol Use: No    Review of Systems  Constitutional: Negative for fever and chills.  Respiratory: Negative for shortness of breath.   Musculoskeletal: Positive for joint swelling.       Right knee pain  Skin: Negative for wound.   Hematological: Bruises/bleeds easily.    Allergies  Atorvastatin  Home Medications   Current Outpatient Rx  Name  Route  Sig  Dispense  Refill  . amLODipine (NORVASC) 10 MG tablet   Oral   Take 10 mg by mouth at bedtime.         Marland Kitchen aspirin EC 81 MG tablet   Oral   Take 81 mg by mouth daily.         Marland Kitchen ezetimibe (ZETIA) 10 MG tablet   Oral   Take 10 mg by mouth daily.         . fish oil-omega-3 fatty acids 1000 MG capsule   Oral   Take 1 g by mouth 3 (three) times daily.         . isosorbide mononitrate (IMDUR) 60 MG 24 hr tablet   Oral   Take 1.5 tablets (90 mg total) by mouth daily. 1 1/2 tablet daily   45 tablet   5   . metoprolol tartrate (LOPRESSOR) 25 MG tablet   Oral   Take 25 mg by mouth 2 (two) times daily.         . niacin (NIASPAN) 500 MG CR tablet   Oral   Take 500 mg by mouth at bedtime.         . rosuvastatin (CRESTOR) 20 MG tablet   Oral   Take 20 mg by mouth daily.         . Ticagrelor (BRILINTA) 90 MG TABS tablet   Oral   Take 1 tablet (90 mg total) by mouth 2 (two) times daily.   64 tablet   0     Lot: [id number] Exp: 12/2014.   Marland Kitchen oxyCODONE-acetaminophen (PERCOCET/ROXICET) 5-325 MG per tablet   Oral   Take 1-2 tablets by mouth every 6 (six) hours as needed for pain.   20 tablet   0    BP 120/58  Pulse 69  Temp(Src) 98.4 F (36.9 C) (Oral)  Resp 20  Ht 5' 9.5" (1.765 m)  Wt 183 lb (83.008 kg)  BMI 26.65 kg/m2  SpO2 98% Physical Exam  Constitutional: He appears well-developed and well-nourished.  Cardiovascular: Normal rate and regular rhythm.   Pulmonary/Chest: Effort normal.  Musculoskeletal: He exhibits tenderness.  Right knee effusion. Tender medially. Decreased range of motion due to pain. Extension is intact. Neurovascular intact over her foot.  Neurological: He is alert.  Skin: Skin is warm.    ED Course  Procedures (including critical care time) Labs Review Labs Reviewed - No data to display Imaging  Review Ct Knee Right Wo Contrast  04/24/2013   *RADIOLOGY REPORT*  Clinical Data: Knee pain after stepping off tractor and filling pop.  CT OF THE RIGHT KNEE WITHOUT CONTRAST  Technique:  Multidetector CT imaging was performed according to the standard protocol. Multiplanar CT image reconstructions were also generated.  Comparison: Plain films today  Findings: Examination demonstrates mild tricompartmental osteoarthritic change.  There is mild  chondrocalcinosis of the medial and lateral compartments.  There is a moderate sized joint effusion present.  There is a subtle differential fluid fluid level over the lateral aspect of this joint effusion suggesting a hemorrhagic component. There is mild diffuse subcutaneous edema. There is no acute fracture or dislocation  IMPRESSION: No acute fracture or dislocation.  Moderate joint effusion with small hemorrhagic component.  This can be associated with internal derangement such as ACL injury. Consider MRI evaluation.  Mild osteoarthritic change   Original Report Authenticated By: Marin Olp, M.D.   Dg Knee Complete 4 Views Right  04/24/2013   *RADIOLOGY REPORT*  Clinical Data: Knee pain.  RIGHT KNEE - COMPLETE 4+ VIEW  Comparison: None  Findings: Examination demonstrates mild tricompartmental osteoarthritis.  There is mild chondrocalcinosis present.  There is a suggestion of a small joint effusion.  Atherosclerotic disease is present.  There are multiple surgical clips over the medial soft tissues.  IMPRESSION: No acute fracture.  Possible small joint effusion.  Mild osteoarthritic change.   Original Report Authenticated By: Marin Olp, M.D.    MDM   1. Internal derangement of knee, acute, right    Patient with right knee injury. X-ray does not show fracture. Due to continued pain CT scan was done   and did not show fracture. Patient has been immobilized will follow with orthopedic  Jasper Riling. Alvino Chapel, MD 04/24/13 [id number]

## 2013-04-24 NOTE — ED Notes (Signed)
Patient stepped off tractor yesterday and felt a "pop" in his right knee .Patient woke up with knee swelling and pain this am, requesting evaluation.

## 2013-04-24 NOTE — ED Notes (Signed)
Bed: WA03 Expected date:  Expected time:  Means of arrival:  Comments: 79 y/o knee pain

## 2013-05-05 ENCOUNTER — Other Ambulatory Visit: Payer: Self-pay

## 2013-05-05 MED ORDER — AMLODIPINE BESYLATE 10 MG PO TABS: 10.0000 mg | ORAL_TABLET | Freq: Every day | ORAL | Status: AC

## 2013-05-05 NOTE — Telephone Encounter (Signed)
Rx was sent to pharmacy electronically. 

## 2013-05-17 ENCOUNTER — Telehealth: Payer: Self-pay | Admitting: Internal Medicine

## 2013-05-17 NOTE — Telephone Encounter (Signed)
Need some samples of Brilinta 90 mg please,

## 2013-05-17 NOTE — Telephone Encounter (Signed)
Returned call and spoke w/ Bethena Roys (EC).  Informed no samples available and if Rx needed have pt call back today before 4pm.  Agreed to inform pt.

## 2013-06-06 ENCOUNTER — Other Ambulatory Visit: Payer: Medicare Other

## 2013-06-06 ENCOUNTER — Ambulatory Visit: Payer: Medicare Other | Admitting: Neurosurgery

## 2013-06-09 ENCOUNTER — Telehealth: Payer: Self-pay | Admitting: Internal Medicine

## 2013-06-09 MED ORDER — TICAGRELOR 90 MG PO TABS: 90.0000 mg | ORAL_TABLET | Freq: Two times a day (BID) | ORAL | Status: DC

## 2013-06-09 NOTE — Telephone Encounter (Signed)
Returned call and pt informed samples left at front desk.  Pt verbalized understanding and agreed w/ plan.    

## 2013-06-09 NOTE — Telephone Encounter (Signed)
Wants to get samples of Brilinta 90 mg.  Please call

## 2013-06-10 ENCOUNTER — Encounter: Payer: Self-pay | Admitting: Family

## 2013-06-13 ENCOUNTER — Encounter: Payer: Self-pay | Admitting: Family

## 2013-06-13 ENCOUNTER — Ambulatory Visit (HOSPITAL_COMMUNITY)
Admission: RE | Admit: 2013-06-13 | Discharge: 2013-06-13 | Disposition: A | Discharge status: Home / Self Care | Payer: Medicare PPO | Source: Ambulatory Visit | Attending: Family | Admitting: Family

## 2013-06-13 ENCOUNTER — Ambulatory Visit (INDEPENDENT_AMBULATORY_CARE_PROVIDER_SITE_OTHER): Payer: Medicare PPO | Admitting: Family

## 2013-06-13 DIAGNOSIS — Z48812 Encounter for surgical aftercare following surgery on the circulatory system: Secondary | ICD-10-CM | POA: Insufficient documentation

## 2013-06-13 DIAGNOSIS — I6529 Occlusion and stenosis of unspecified carotid artery: Secondary | ICD-10-CM

## 2013-06-13 NOTE — Patient Instructions (Signed)
Stroke Prevention Some medical conditions and behaviors are associated with an increased chance of having a stroke. You may prevent a stroke by making healthy choices and managing medical conditions. Reduce your risk of having a stroke by:  Staying physically active. Get at least 30 minutes of activity on most or all days.  Not smoking. It may also be helpful to avoid exposure to secondhand smoke.  Limiting alcohol use. Moderate alcohol use is considered to be:  No more than 2 drinks per day for men.  No more than 1 drink per day for nonpregnant women.  Eating healthy foods.  Include 5 or more servings of fruits and vegetables a day.  Certain diets may be prescribed to address high blood pressure, high cholesterol, diabetes, or obesity.  Managing your cholesterol levels.  A low-saturated fat, low-trans fat, low-cholesterol, and high-fiber diet may control cholesterol levels.  Take any prescribed medicines to control cholesterol as directed by your caregiver.  Managing your diabetes.  A controlled-carbohydrate, controlled-sugar diet is recommended to manage diabetes.  Take any prescribed medicines to control diabetes as directed by your caregiver.  Controlling your high blood pressure (hypertension).  A low-salt (sodium), low-saturated fat, low-trans fat, and low-cholesterol diet is recommended to manage high blood pressure.  Take any prescribed medicines to control hypertension as directed by your caregiver.  Maintaining a healthy weight.  A reduced-calorie, low-sodium, low-saturated fat, low-trans fat, low-cholesterol diet is recommended to manage weight.  Stopping drug abuse.  Avoiding birth control pills.  Talk to your caregiver about the risks of taking birth control pills if you are over 35 years old, smoke, get migraines, or have ever had a blood clot.  Getting evaluated for sleep disorders (sleep apnea).  Talk to your caregiver about getting a sleep evaluation  if you snore a lot or have excessive sleepiness.  Taking medicines as directed by your caregiver.  For some people, aspirin or blood thinners (anticoagulants) are helpful in reducing the risk of forming abnormal blood clots that can lead to stroke. If you have the irregular heart rhythm of atrial fibrillation, you should be on a blood thinner unless there is a good reason you cannot take them.  Understand all your medicine instructions. SEEK IMMEDIATE MEDICAL CARE IF:   You have sudden weakness or numbness of the face, arm, or leg, especially on one side of the body.  You have sudden confusion.  You have trouble speaking (aphasia) or understanding.  You have sudden trouble seeing in one or both eyes.  You have sudden trouble walking.  You have dizziness.  You have a loss of balance or coordination.  You have a sudden, severe headache with no known cause.  You have new chest pain or an irregular heartbeat. Any of these symptoms may represent a serious problem that is an emergency. Do not wait to see if the symptoms will go away. Get medical help right away. Call your local emergency services (911 in [place] Do not drive yourself to the hospital. Document Released: 09/11/2004 Document Revised: 10/27/2011 Document Reviewed: 03/24/2011 [employer] Patient Information 2014 [employer].  

## 2013-06-13 NOTE — Progress Notes (Signed)
Established Carotid Patient   History of Present Illness  [name] is a 79 y.o. male who is a patient of Dr. Trula Slade who status post bilateral CEAs in 2008.  Has 2 cardiac stents, denies MI. His cardiologist is Dr. Debara Pickett.  Had TIA before Sept. 2008 as manifested by right arm weakness which has resolved, denies strokes or TIA symptoms since CEA's  The patient denies amaurosis fugax or monocular blindness.  The patient denies facial drooping.  The patient denies receptive or expressive aphasia.  Pt. denies extremity weakness.   reports New Medical or Surgical History: right knee arthroscopy for fluid.  Pt Diabetic: No Pt smoker: non-smoker  Pt meds include: Statin : Yes Betablocker: Yes ASA: Yes Other anticoagulants/antiplatelets: Brilinta since last cardiac stent   Past Medical History  Diagnosis Date  . Shortness of breath   . Chronic kidney disease   . Coronary artery disease   . Hypertension   . Stroke     hx of TIA  . Blood transfusion     "  no reaction to transfusion "  . Dysrhythmia     LLB  anf leaking valve per patient  . Dyspnea on exertion, anginal equivilant 10/16/2010    2D Echo - EF >55%, mild-moderate mitral regurgiation, mild-moderate tricuspid regurgitation, moderate calcification of the aortic leaflets  . CAD (coronary artery disease), CABG 1996. 09/23/2011    2D Echo - EF >55%, moderate calcification of the aortic valve leaflets  . HTN (hypertension),uncontrolled 09/16/2011  . CKD (chronic kidney disease) stage 2, GFR 60-89 ml/min 09/16/2011  . Pulmonary hypertension, mild 09/17/2011  . Angina effort, 12/2011 with DOE anginal equivlent 12/22/2011  . Abnormal cardiovascular stress test, 11/2011 wint inferolateral reveribility 12/22/2011  . Recurrent angina status post rotational atherectomy, with PTCA for ISR to RCA, 12/22/2011 12/22/2011  . Carotid artery disease, Rt. CEA, 08/04/07, Lt. CEA 04/26/07. 12/23/2011  . Crescendo angina 06/09/2012  . CVA (cerebral vascular  accident) 04/22/2007    2D Echo - EF 50-55%, aortic valve thickness mild-moderately increased,    Social History History  Substance Use Topics  . Smoking status: Former Smoker    Types: Cigarettes  . Smokeless tobacco: Former Systems developer     Comment: rarely smoked when he did smoke   . Alcohol Use: No    Family History Family History  Problem Relation Age of Onset  . Cancer Mother   . Heart disease Mother   . Stroke Mother   . Alcohol abuse Father   . Depression Father   . Alzheimer's disease Father   . Heart disease Brother     Heart bypass in 1996  . Cancer Brother     Prostate  . Cancer Sister     Surgical History Past Surgical History  Procedure Laterality Date  . Coronary artery bypass graft  08/04/2007  . Cardiac catheterization Left 06/10/2012    Ostial RCA 99% stenosis pre-dilation 3x29mm Angiosculpt PTCA balloon, 3.5x40mm Promus Element DES overlapping proximal stent and into the Aorto-ostium, post-dilated with4x44mm [place] Trek balloon - 99% stenosis reduced to 0%  . Coronary angioplasty    . Cea  2008  . Cardiac catheterization Left 12/22/2011    Left Main-diffuse tapering 50-60% stenosis; mild-moderate diseaseof the right ventricle branch of the right coronary artery; will plan FFR of ostial RCA  . Fractional flow reserve wire  12/22/2011    Ostial RCA, FFR ratio-0.82, physiologically significant, 3.25x29mm Flextome Cutting balloon deployed at ~3.79mm final estimated diameter  . Cardiac  catheterization Bilateral 09/16/2011    Severe 90% ostial RCA disease which is calcified, will likely need PCI to ostial RCA with calcification  . Cardiac catheterization  09/18/2011    Calcified ostial 90-95% proximal RCA stenosis, 3x61mm Emerge balloon predilation, 3x70mm DES PROMUS Element stent was placed, post stent dilation done utilizing a 3.25x50mm noncompliant Quantum Apex balloon, 90-95% stenosis reduced to 0%  .  fluid knee Right Sept. 6, 2014    Fluid removed    Allergies   Allergen Reactions  . Atorvastatin Other (See Comments)    Muscle and joint pain, can tolerate rosuvastatin    Current Outpatient Prescriptions  Medication Sig Dispense Refill  . amLODipine (NORVASC) 10 MG tablet Take 1 tablet (10 mg total) by mouth at bedtime.  30 tablet  6  . aspirin EC 81 MG tablet Take 81 mg by mouth daily.      Marland Kitchen ezetimibe (ZETIA) 10 MG tablet Take 10 mg by mouth daily.      . fish oil-omega-3 fatty acids 1000 MG capsule Take 1 g by mouth 3 (three) times daily.      . furosemide (LASIX) 40 MG tablet as needed.       . isosorbide mononitrate (IMDUR) 60 MG 24 hr tablet Take 1.5 tablets (90 mg total) by mouth daily. 1 1/2 tablet daily  45 tablet  5  . metoprolol tartrate (LOPRESSOR) 25 MG tablet Take 25 mg by mouth 2 (two) times daily.      . niacin (NIASPAN) 500 MG CR tablet Take 500 mg by mouth at bedtime.      . rosuvastatin (CRESTOR) 20 MG tablet Take 20 mg by mouth daily.      . Ticagrelor (BRILINTA) 90 MG TABS tablet Take 1 tablet (90 mg total) by mouth 2 (two) times daily.  64 tablet  0  . oxyCODONE-acetaminophen (PERCOCET/ROXICET) 5-325 MG per tablet Take 1-2 tablets by mouth every 6 (six) hours as needed for pain.  20 tablet  0   No current facility-administered medications for this visit.   Facility-Administered Medications Ordered in Other Visits  Medication Dose Route Frequency Provider Last Rate Last Dose  . sodium chloride 0.9 % injection 3 mL  3 mL Intravenous PRN Pixie Casino, MD        Review of Systems : [x]  Positive   [ ]  Denies  General:[ ]  Weight loss,  [ ]  Weight gain, [ ]  Loss of appetite, [ ]  Fever, [ ]  chills  Neurologic: [ ]  Dizziness, [ ]  Blackouts, [ ]  Headaches, [ ]  Seizure [ ]  Stroke, [ ]  "Mini stroke", [ ]  Slurred speech, [ ]  Temporary blindness;  [ ] weakness,  Ear/Nose/Throat: [ ]  Change in hearing, [ ]  Nose bleeds, [ ]  Hoarseness  Vascular:[ ]  Pain in legs with walking, [ ]  Pain in feet while lying flat , [ ]   Non-healing  ulcer, [ ]  Blood clot in vein,    Pulmonary: [ ]  Home oxygen, [ ]   Productive cough, [ ]  Bronchitis, [ ]  Coughing up blood,  [ ]  Asthma, [ ]  Wheezing  Musculoskeletal:  [ ]  Arthritis, [ ]  Joint pain, [ ]  low back pain  Cardiac: [ ]  Chest pain, [ ]  Shortness of breath when lying flat, [ ]  Shortness of breath with exertion, [ ]  Palpitations, [ ]  Heart murmur, [ ]   Atrial fibrillation  Hematologic:[ ]  Easy Bruising, [ ]  Anemia; [ ]  Hepatitis  Psychiatric: [ ]   Depression, [ ]  Anxiety   Gastrointestinal: [ ]   Black stool, [ ]  Blood in stool, [ ]  Peptic ulcer disease,  [ ]  Gastroesophageal Reflux, [ ]  Trouble swallowing, [ ]  Diarrhea, [ ]  Constipation  Urinary: [ ]  chronic Kidney disease, [ ]  on HD, [ ]  Burning with urination, [ ]  Frequent urination, [ ]  Difficulty urinating;   Skin: [ ]  Rashes, [ ]  Wounds    Physical Examination  Filed Vitals:   06/13/13 1130  BP: 151/78  Pulse: 49  Resp:    Filed Weights   06/13/13 1127  Weight: 187 lb (84.823 kg)   Body mass index is 27.23 kg/(m^2).  General: WDWN male in NAD GAIT: normal Eyes: PERRLA Pulmonary:  CTAB, Negative  Rales, Negative rhonchi, & Negative wheezing.  Cardiac: regular Rhythm ,  Positive Murmurs.  VASCULAR EXAM Carotid Bruits Left Right   Transmitted cardiac murmur Negative    Aorta is not palpable. Radial pulses are 3+ palpable and equal.                                                                                                                            LE Pulses LEFT RIGHT       POPLITEAL  not palpable   not palpable       POSTERIOR TIBIAL   palpable    palpable        DORSALIS PEDIS      ANTERIOR TIBIAL  palpable   palpable     Gastrointestinal: soft, nontender, BS WNL, no r/g,  negative masses.  Musculoskeletal: Negative muscle atrophy/wasting. M/S 5/5 throughout, Extremities without ischemic changes.  Neurologic: A&O X 3; Appropriate Affect ; SENSATION ; Speech is normal CN 2-12 intact,  Pain and light touch intact in extremities, Motor exam as listed above.   Non-Invasive Vascular Imaging CAROTID DUPLEX 06/13/2013   Right ICA: patent CEA site Left ICA: patent CEA site.  These findings are Unchanged from previous exam.  Assessment: SOM HUDA is a 79 y.o. male who presents with asymptomatic patent bilateral ICA's, both CEA sites. The  ICA stenosis is  Unchanged from previous exam. Brachial pressures are equal.  Plan: Follow-up in 1 year with Carotid Duplex scan.   I discussed in depth with the patient the nature of atherosclerosis, and emphasized the importance of maximal medical management including strict control of blood pressure, blood glucose, and lipid levels, obtaining regular exercise, and continued cessation of smoking.  The patient is aware that without maximal medical management the underlying atherosclerotic disease process will progress, limiting the benefit of any interventions. The patient was given information about stroke prevention and what symptoms should prompt the patient to seek immediate medical care. Thank you for allowing [place] to participate in this patient's care.  Clemon Chambers, RN, MSN, FNP-C [facility] Office: [facility] Physician: Trula Slade  06/13/2013 11:59 AM

## 2013-06-14 NOTE — Addendum Note (Signed)
Addended by: Mena Goes on: 06/14/2013 08:16 AM   Modules accepted: Orders

## 2013-07-05 ENCOUNTER — Telehealth: Payer: Self-pay | Admitting: Internal Medicine

## 2013-07-05 MED ORDER — TICAGRELOR 90 MG PO TABS: 90.0000 mg | ORAL_TABLET | Freq: Two times a day (BID) | ORAL | Status: DC

## 2013-07-05 NOTE — Telephone Encounter (Signed)
Returned call and pt informed samples left at front desk.  Pt verbalized understanding and agreed w/ plan.    

## 2013-07-05 NOTE — Telephone Encounter (Signed)
Need some samples of Brilina please. Will pick them up on Thursday,he has an appt.

## 2013-07-07 ENCOUNTER — Ambulatory Visit (INDEPENDENT_AMBULATORY_CARE_PROVIDER_SITE_OTHER): Payer: Medicare PPO | Admitting: Internal Medicine

## 2013-07-07 ENCOUNTER — Encounter: Payer: Self-pay | Admitting: Internal Medicine

## 2013-07-07 VITALS — BP 140/66 | HR 53 | Ht 69.5 in | Wt 185.4 lb

## 2013-07-07 DIAGNOSIS — I10 Essential (primary) hypertension: Secondary | ICD-10-CM

## 2013-07-07 DIAGNOSIS — N183 Chronic kidney disease, stage 3 unspecified: Secondary | ICD-10-CM

## 2013-07-07 DIAGNOSIS — I209 Angina pectoris, unspecified: Secondary | ICD-10-CM

## 2013-07-07 DIAGNOSIS — I779 Disorder of arteries and arterioles, unspecified: Secondary | ICD-10-CM

## 2013-07-07 DIAGNOSIS — Z9889 Other specified postprocedural states: Secondary | ICD-10-CM

## 2013-07-07 DIAGNOSIS — I2510 Atherosclerotic heart disease of native coronary artery without angina pectoris: Secondary | ICD-10-CM

## 2013-07-07 DIAGNOSIS — I350 Nonrheumatic aortic (valve) stenosis: Secondary | ICD-10-CM

## 2013-07-07 DIAGNOSIS — I359 Nonrheumatic aortic valve disorder, unspecified: Secondary | ICD-10-CM

## 2013-07-07 NOTE — Progress Notes (Signed)
OFFICE NOTE  Chief Complaint:  Routine follow-up, knee pain  Primary Care Physician: [name], MD  HPI:  [name] is a 79 year old gentleman who had unfortunate aggressive restenosis of an RCA lesion in the past, recently restented. He presented with worsening anginal symptoms and medical therapy was recommended in November. He was scheduled for a nuclear stress test which he underwent on July 21, 2012, and this was negative for ischemia. Recently, he had been experiencing some soreness in his left chest wall which has been going on and off for about a month. He is not experiencing the shortness of breath that he previously experienced with his angina, especially when walking up hills. He noted that he had an episode of a fall when he was trying to lift a very heavy deer, landed on his back and perhaps strained or sprained intercostal muscles or something in the chest wall. Based on his symptoms he underwent stress testing which indicated ischemia and he underwent a repeat coronary dimension  PMHx:  Past Medical History  Diagnosis Date  . Shortness of breath   . Chronic kidney disease   . Coronary artery disease   . Hypertension   . Stroke     hx of TIA  . Blood transfusion     "  no reaction to transfusion "  . Dysrhythmia     LLB  anf leaking valve per patient  . Dyspnea on exertion, anginal equivilant 10/16/2010    2D Echo - EF >55%, mild-moderate mitral regurgiation, mild-moderate tricuspid regurgitation, moderate calcification of the aortic leaflets  . CAD (coronary artery disease), CABG 1996. 09/23/2011    2D Echo - EF >55%, moderate calcification of the aortic valve leaflets  . HTN (hypertension),uncontrolled 09/16/2011  . CKD (chronic kidney disease) stage 2, GFR 60-89 ml/min 09/16/2011  . Pulmonary hypertension, mild 09/17/2011  . Angina effort, 12/2011 with DOE anginal equivlent 12/22/2011  . Abnormal cardiovascular stress test, 11/2011 wint inferolateral  reveribility 12/22/2011  . Recurrent angina status post rotational atherectomy, with PTCA for ISR to RCA, 12/22/2011 12/22/2011  . Carotid artery disease, Rt. CEA, 08/04/07, Lt. CEA 04/26/07. 12/23/2011  . Crescendo angina 06/09/2012  . CVA (cerebral vascular accident) 04/22/2007    2D Echo - EF 50-55%, aortic valve thickness mild-moderately increased,    Past Surgical History  Procedure Laterality Date  . Coronary artery bypass graft  08/04/2007  . Cardiac catheterization Left 06/10/2012    Ostial RCA 99% stenosis pre-dilation 3x39mm Angiosculpt PTCA balloon, 3.5x50mm Promus Element DES overlapping proximal stent and into the Aorto-ostium, post-dilated with4x7mm [place] Trek balloon - 99% stenosis reduced to 0%  . Coronary angioplasty    . Cea  2008  . Cardiac catheterization Left 12/22/2011    Left Main-diffuse tapering 50-60% stenosis; mild-moderate diseaseof the right ventricle branch of the right coronary artery; will plan FFR of ostial RCA  . Fractional flow reserve wire  12/22/2011    Ostial RCA, FFR ratio-0.82, physiologically significant, 3.25x22mm Flextome Cutting balloon deployed at ~3.93mm final estimated diameter  . Cardiac catheterization Bilateral 09/16/2011    Severe 90% ostial RCA disease which is calcified, will likely need PCI to ostial RCA with calcification  . Cardiac catheterization  09/18/2011    Calcified ostial 90-95% proximal RCA stenosis, 3x13mm Emerge balloon predilation, 3x23mm DES PROMUS Element stent was placed, post stent dilation done utilizing a 3.25x6mm noncompliant Quantum Apex balloon, 90-95% stenosis reduced to 0%  .  fluid knee Right Sept. 6, 2014  Fluid removed    FAMHx:  Family History  Problem Relation Age of Onset  . Cancer Mother   . Heart disease Mother   . Stroke Mother   . Alcohol abuse Father   . Depression Father   . Alzheimer's disease Father   . Heart disease Brother     Heart bypass in 1996  . Cancer Brother     Prostate  . Cancer Sister      SOCHx:   reports that he has quit smoking. His smoking use included Cigarettes. He smoked 0.00 packs per day. He has never used smokeless tobacco. He reports that he does not drink alcohol or use illicit drugs.  ALLERGIES:  Allergies  Allergen Reactions  . Atorvastatin Other (See Comments)    Muscle and joint pain, can tolerate rosuvastatin    ROS: A comprehensive review of systems was negative except for: Musculoskeletal: positive for right knee pain  HOME MEDS: Current Outpatient Prescriptions  Medication Sig Dispense Refill  . amLODipine (NORVASC) 10 MG tablet Take 1 tablet (10 mg total) by mouth at bedtime.  30 tablet  6  . aspirin EC 81 MG tablet Take 81 mg by mouth daily.      Marland Kitchen ezetimibe (ZETIA) 10 MG tablet Take 10 mg by mouth daily.      . fish oil-omega-3 fatty acids 1000 MG capsule Take 1 g by mouth 3 (three) times daily.      . furosemide (LASIX) 40 MG tablet as needed.       . isosorbide mononitrate (IMDUR) 60 MG 24 hr tablet Take 1.5 tablets (90 mg total) by mouth daily. 1 1/2 tablet daily  45 tablet  5  . metoprolol tartrate (LOPRESSOR) 25 MG tablet Take 25 mg by mouth 2 (two) times daily.      . niacin (NIASPAN) 500 MG CR tablet Take 500 mg by mouth at bedtime.      . rosuvastatin (CRESTOR) 20 MG tablet Take 20 mg by mouth daily.      . Ticagrelor (BRILINTA) 90 MG TABS tablet Take 1 tablet (90 mg total) by mouth 2 (two) times daily.  64 tablet  0   No current facility-administered medications for this visit.   Facility-Administered Medications Ordered in Other Visits  Medication Dose Route Frequency Provider Last Rate Last Dose  . sodium chloride 0.9 % injection 3 mL  3 mL Intravenous PRN Pixie Casino, MD        LABS/IMAGING: No results found for this or any previous visit (from the past 48 hour(s)). No results found.  VITALS: BP 140/66  Pulse 53  Ht 5' 9.5" (1.765 m)  Wt 185 lb 6.4 oz (84.097 kg)  BMI 27.00 kg/m2  EXAM: General appearance:  alert and no distress Neck: no carotid bruit and no JVD Lungs: clear to auscultation bilaterally Heart: regular rate and rhythm, S1, S2 normal and systolic murmur: early systolic 3/6, crescendo at 2nd right intercostal space Abdomen: soft, non-tender; bowel sounds normal; no masses,  no organomegaly Extremities: mild right knee swelling Pulses: 2+ and symmetric Skin: Skin color, texture, turgor normal. No rashes or lesions Neurologic: Grossly normal  EKG: Sinus rhythm, left bundle branch block, PAC  ASSESSMENT: 1. Coronary artery disease with multiple recent PCI to the right coronary 2. Chest wall pain 3. History of bilateral carotid endarterectomies 4. Hypertension 5. Dyslipidemia 6. Left bundle branch block 7. Stage III chronic kidney disease 8. Moderate aortic stenosis 9. Recent right hemorrhagic knee effusion secondary  to trauma  PLAN: 1.   Mr. Swiney is doing fairly well. His shortness of breath is stable and he's not describing any further chest pain. He is blood pressure is also well controlled. His renal function has been increasing somewhat in recent creatinine was 1.8. This will need to be watched closely. His lipid profile continues to show a very low LDL particle numbers 658, LDL C. of 35 HDL C. at 55 and triglycerides of 102. Recent labs from August are unchanged. He could very well come off of Zetia or go on a reduced dose of Crestor with the goal of keeping the LDL somewhat above 50. I feel that he is perhaps somewhat overtreated, but it is encouraging that his particle numbers well below 1000. Otherwise would not recommend any further treatment at this time. He will need ongoing surveillance of his aortic stenosis and a repeat echocardiogram at his next visit in 6 months.  Pixie Casino, MD, [facility] Attending Cardiologist [facility]  Deontay Ladnier C 07/07/2013, 11:24 AM

## 2013-07-07 NOTE — Patient Instructions (Signed)
Your physician wants you to follow-up in: 6 months with Dr. [name]. You will receive a reminder letter in the mail two months in advance. If you don't receive a letter, please call our office to schedule the follow-up appointment.    

## 2013-08-15 ENCOUNTER — Other Ambulatory Visit: Payer: Self-pay | Admitting: *Deleted

## 2013-08-15 MED ORDER — ISOSORBIDE MONONITRATE ER 60 MG PO TB24: 90.0000 mg | ORAL_TABLET | Freq: Every day | ORAL | Status: DC

## 2013-08-19 ENCOUNTER — Telehealth: Payer: Self-pay | Admitting: Internal Medicine

## 2013-08-19 NOTE — Telephone Encounter (Signed)
Would like to know if we have samples of Brilinta 90 mg .. Please call   Thanks

## 2013-08-19 NOTE — Telephone Encounter (Signed)
Samples out front, pt. clled and informed

## 2013-08-19 NOTE — Telephone Encounter (Signed)
Samples provided. Patient notified.

## 2013-08-24 ENCOUNTER — Encounter: Payer: Self-pay | Admitting: Internal Medicine

## 2013-08-24 ENCOUNTER — Ambulatory Visit (INDEPENDENT_AMBULATORY_CARE_PROVIDER_SITE_OTHER): Payer: Medicare PPO | Admitting: Internal Medicine

## 2013-08-24 VITALS — BP 132/80 | HR 69 | Ht 69.5 in | Wt 192.5 lb

## 2013-08-24 DIAGNOSIS — Z48812 Encounter for surgical aftercare following surgery on the circulatory system: Secondary | ICD-10-CM

## 2013-08-24 DIAGNOSIS — R0989 Other specified symptoms and signs involving the circulatory and respiratory systems: Secondary | ICD-10-CM

## 2013-08-24 DIAGNOSIS — R0609 Other forms of dyspnea: Secondary | ICD-10-CM

## 2013-08-24 DIAGNOSIS — I350 Nonrheumatic aortic (valve) stenosis: Secondary | ICD-10-CM

## 2013-08-24 DIAGNOSIS — I359 Nonrheumatic aortic valve disorder, unspecified: Secondary | ICD-10-CM

## 2013-08-24 DIAGNOSIS — R0602 Shortness of breath: Secondary | ICD-10-CM

## 2013-08-24 DIAGNOSIS — Z79899 Other long term (current) drug therapy: Secondary | ICD-10-CM

## 2013-08-24 DIAGNOSIS — I2510 Atherosclerotic heart disease of native coronary artery without angina pectoris: Secondary | ICD-10-CM

## 2013-08-24 DIAGNOSIS — N183 Chronic kidney disease, stage 3 unspecified: Secondary | ICD-10-CM

## 2013-08-24 DIAGNOSIS — R079 Chest pain, unspecified: Secondary | ICD-10-CM

## 2013-08-24 MED ORDER — TICAGRELOR 90 MG PO TABS: 90.0000 mg | ORAL_TABLET | Freq: Two times a day (BID) | ORAL | Status: DC

## 2013-08-24 NOTE — Progress Notes (Signed)
OFFICE NOTE  Chief Complaint:  DOE  Primary Care Physician: [name], MD  HPI:  [name] is a 80 year old gentleman who had unfortunate aggressive restenosis of an RCA lesion in the past, recently restented. He presented with worsening anginal symptoms and medical therapy was recommended in November. He was scheduled for a nuclear stress test which he underwent on July 21, 2012, and this was negative for ischemia. Recently, he had been experiencing some soreness in his left chest wall which has been going on and off for about a month. He is not experiencing the shortness of breath that he previously experienced with his angina, especially when walking up hills. He noted that he had an episode of a fall when he was trying to lift a very heavy deer, landed on his back and perhaps strained or sprained intercostal muscles or something in the chest wall. Based on his symptoms he underwent stress testing which indicated ischemia and he underwent a repeat coronary intervention to the RCA.   At his last office visit he was doing fairly well, without worsening shortness of breath or chest discomfort.  [place] but noted a small increase in shortness of breath with activity. He's also had weight gain recently and some lower extremity swelling. He says his symptoms are somewhat reminiscent of the symptoms that required prior coronary intervention about one year ago, but not as significant. Of note, he does have moderate to severe aortic stenosis which were touching very closely and a history of diastolic dysfunction plus or minus heart failure.  PMHx:  Past Medical History  Diagnosis Date  . Shortness of breath   . Chronic kidney disease   . Coronary artery disease   . Hypertension   . Stroke     hx of TIA  . Blood transfusion     "  no reaction to transfusion "  . Dysrhythmia     LLB  anf leaking valve per patient  . Dyspnea on  exertion, anginal equivilant 10/16/2010    2D Echo - EF >55%, mild-moderate mitral regurgiation, mild-moderate tricuspid regurgitation, moderate calcification of the aortic leaflets  . CAD (coronary artery disease), CABG 1996. 09/23/2011    2D Echo - EF >55%, moderate calcification of the aortic valve leaflets  . HTN (hypertension),uncontrolled 09/16/2011  . CKD (chronic kidney disease) stage 2, GFR 60-89 ml/min 09/16/2011  . Pulmonary hypertension, mild 09/17/2011  . Angina effort, 12/2011 with DOE anginal equivlent 12/22/2011  . Abnormal cardiovascular stress test, 11/2011 wint inferolateral reveribility 12/22/2011  . Recurrent angina status post rotational atherectomy, with PTCA for ISR to RCA, 12/22/2011 12/22/2011  . Carotid artery disease, Rt. CEA, 08/04/07, Lt. CEA 04/26/07. 12/23/2011  . Crescendo angina 06/09/2012  . CVA (cerebral vascular accident) 04/22/2007    2D Echo - EF 50-55%, aortic valve thickness mild-moderately increased,    Past Surgical History  Procedure Laterality Date  . Coronary artery bypass graft  08/04/2007  . Cardiac catheterization Left 06/10/2012    Ostial RCA 99% stenosis pre-dilation 3x52mm Angiosculpt PTCA balloon, 3.5x68mm Promus Element DES overlapping proximal stent and into the Aorto-ostium, post-dilated with4x34mm [place] Trek balloon - 99% stenosis reduced to 0%  . Coronary angioplasty    . Cea  2008  . Cardiac catheterization Left 12/22/2011    Left Main-diffuse tapering 50-60% stenosis; mild-moderate diseaseof the right ventricle branch of the right coronary artery; will plan FFR of ostial RCA  .  Fractional flow reserve wire  12/22/2011    Ostial RCA, FFR ratio-0.82, physiologically significant, 3.25x52mm Flextome Cutting balloon deployed at ~3.93mm final estimated diameter  . Cardiac catheterization Bilateral 09/16/2011    Severe 90% ostial RCA disease which is calcified, will likely need PCI to ostial RCA with calcification  . Cardiac catheterization  09/18/2011    Calcified  ostial 90-95% proximal RCA stenosis, 3x69mm Emerge balloon predilation, 3x78mm DES PROMUS Element stent was placed, post stent dilation done utilizing a 3.25x35mm noncompliant Quantum Apex balloon, 90-95% stenosis reduced to 0%  .  fluid knee Right Sept. 6, 2014    Fluid removed    FAMHx:  Family History  Problem Relation Age of Onset  . Cancer Mother   . Heart disease Mother   . Stroke Mother   . Alcohol abuse Father   . Depression Father   . Alzheimer's disease Father   . Heart disease Brother     Heart bypass in 1996  . Cancer Brother     Prostate  . Cancer Sister     SOCHx:   reports that he has quit smoking. His smoking use included Cigarettes. He smoked 0.00 packs per day. He has never used smokeless tobacco. He reports that he does not drink alcohol or use illicit drugs.  ALLERGIES:  Allergies  Allergen Reactions  . Atorvastatin Other (See Comments)    Muscle and joint pain, can tolerate rosuvastatin    ROS: A comprehensive review of systems was negative except for: Respiratory: positive for dyspnea on exertion Cardiovascular: positive for chest pain  HOME MEDS: Current Outpatient Prescriptions  Medication Sig Dispense Refill  . amLODipine (NORVASC) 10 MG tablet Take 1 tablet (10 mg total) by mouth at bedtime.  30 tablet  6  . aspirin EC 81 MG tablet Take 81 mg by mouth daily.      . clotrimazole-betamethasone (LOTRISONE) cream 3 (three) times daily as needed.      . ezetimibe (ZETIA) 10 MG tablet Take 10 mg by mouth daily.      . fish oil-omega-3 fatty acids 1000 MG capsule Take 1 g by mouth 3 (three) times daily.      . furosemide (LASIX) 40 MG tablet as needed.       . isosorbide mononitrate (IMDUR) 60 MG 24 hr tablet Take 1.5 tablets (90 mg total) by mouth daily. 1 1/2 tablet daily  45 tablet  5  . metoprolol tartrate (LOPRESSOR) 25 MG tablet Take 25 mg by mouth 2 (two) times daily.      . niacin (NIASPAN) 500 MG CR tablet Take 500 mg by mouth at bedtime.        Marland Kitchen NITROSTAT 0.4 MG SL tablet Place 1 tablet under the tongue every 5 (five) minutes x 3 doses as needed.      . rosuvastatin (CRESTOR) 20 MG tablet Take 20 mg by mouth daily.      . Ticagrelor (BRILINTA) 90 MG TABS tablet Take 1 tablet (90 mg total) by mouth 2 (two) times daily.  40 tablet  0   No current facility-administered medications for this visit.   Facility-Administered Medications Ordered in Other Visits  Medication Dose Route Frequency Provider Last Rate Last Dose  . sodium chloride 0.9 % injection 3 mL  3 mL Intravenous PRN Pixie Casino, MD        LABS/IMAGING: No results found for this or any previous visit (from the past 48 hour(s)). No results found.  VITALS: BP 132/80  Pulse 69  Ht 5' 9.5" (1.765 m)  Wt 192 lb 8 oz (87.317 kg)  BMI 28.03 kg/m2  EXAM: General appearance: alert and no distress Neck: JVP elevated to 3 cmH20 Lungs: clear to auscultation bilaterally Heart: regular rate and rhythm, S1, S2 diminished and systolic murmur: mid-late peaking systolic 3/6, crescendo at 2nd right intercostal space Abdomen: soft, non-tender; bowel sounds normal; no masses,  no organomegaly Extremities: trace to 1+ bilateral edema Pulses: 2+ and symmetric Skin: Skin color, texture, turgor normal. No rashes or lesions Neurologic: Grossly normal  EKG: Sinus rhythm, left bundle branch block, PVC's  ASSESSMENT: 1. Coronary artery disease with multiple recent PCI to the right coronary 2. History of bilateral carotid endarterectomies 3. Hypertension 4. Dyslipidemia 5. Left bundle branch block 6. Stage III chronic kidney disease 7. Moderate aortic stenosis 8. DOE  PLAN: 1.   Mr. Greenfield is again having chest discomfort and more of a sensation of shortness of breath with exertion. This has typically been an anginal equivalent in the past, however on exam today he does have elevated neck veins, lower extremity swelling and is known of course to have moderate to severe aortic  stenosis.  I suspect some of his shortness of breath could be related to diastolic heart failure. He does have Lasix however did not take it regularly, and I've recommended that he start taking Lasix 40 mg daily for the next week. Other then recheck a metabolic profile and BNP. If his symptoms do not improve with diuresis orders not a significant improvement in his weight, then I would recommend repeat nuclear stress testing to evaluate for patency of his right coronary artery. Plan to see him back in a few weeks to see if his symptoms have improved.  Thanks as always for allowing me to participate in his care.  Pixie Casino, MD, [facility] Attending Cardiologist [facility]  Antha Niday C 08/24/2013, 4:58 PM

## 2013-08-24 NOTE — Patient Instructions (Signed)
Your physician recommends that you return for lab work in: TODAY  Please follow up with Dr. Debara Pickett in 2 weeks.   Please monitor your daily weights and bring a list with you to your next appointment.

## 2013-08-25 LAB — BASIC METABOLIC PANEL
BUN: 34 mg/dL — ABNORMAL HIGH (ref 6–23)
CO2: 26 mEq/L (ref 19–32)
Calcium: 9.2 mg/dL (ref 8.4–10.5)
Chloride: 104 mEq/L (ref 96–112)
Creat: 1.62 mg/dL — ABNORMAL HIGH (ref 0.50–1.35)
Glucose, Bld: 83 mg/dL (ref 70–99)
POTASSIUM: 4.6 meq/L (ref 3.5–5.3)
SODIUM: 139 meq/L (ref 135–145)

## 2013-08-25 LAB — BRAIN NATRIURETIC PEPTIDE: BRAIN NATRIURETIC PEPTIDE: 75.9 pg/mL (ref 0.0–100.0)

## 2013-08-29 ENCOUNTER — Other Ambulatory Visit: Payer: Self-pay | Admitting: *Deleted

## 2013-08-29 DIAGNOSIS — R0602 Shortness of breath: Secondary | ICD-10-CM

## 2013-08-29 DIAGNOSIS — I200 Unstable angina: Secondary | ICD-10-CM

## 2013-09-02 ENCOUNTER — Ambulatory Visit (HOSPITAL_COMMUNITY)
Admission: RE | Admit: 2013-09-02 | Discharge: 2013-09-02 | Disposition: A | Discharge status: Home / Self Care | Payer: Medicare PPO | Source: Ambulatory Visit | Attending: Cardiovascular Disease | Admitting: Cardiovascular Disease

## 2013-09-02 DIAGNOSIS — I200 Unstable angina: Secondary | ICD-10-CM

## 2013-09-02 DIAGNOSIS — R0602 Shortness of breath: Secondary | ICD-10-CM | POA: Insufficient documentation

## 2013-09-02 MED ORDER — TECHNETIUM TC 99M SESTAMIBI GENERIC - CARDIOLITE
10.1000 | Freq: Once | INTRAVENOUS | Status: AC | PRN
  Administered 2013-09-02: 10.1 via INTRAVENOUS

## 2013-09-02 MED ORDER — TECHNETIUM TC 99M SESTAMIBI GENERIC - CARDIOLITE
30.9000 | Freq: Once | INTRAVENOUS | Status: AC | PRN
  Administered 2013-09-02: 30.9 via INTRAVENOUS

## 2013-09-02 MED ORDER — AMINOPHYLLINE 25 MG/ML IV SOLN
150.0000 mg | Freq: Once | INTRAVENOUS | Status: AC
  Administered 2013-09-02: 150 mg via INTRAVENOUS

## 2013-09-02 MED ORDER — REGADENOSON 0.4 MG/5ML IV SOLN
0.4000 mg | Freq: Once | INTRAVENOUS | Status: AC
  Administered 2013-09-02: 0.4 mg via INTRAVENOUS

## 2013-09-02 NOTE — Procedures (Addendum)
[facility] [address] [place] [id number]  Cardiology Nuclear Med Study  [name] is a 80 y.o. male     MRN : [id number]     DOB: 05-02-1934  Procedure Date: 09/02/2013  Nuclear Med Background Indication for Stress Test:  Evaluation for Ischemia, Stent Patency and Abnormal EKG History:  CAD;CABG X3--1996;STENT/PTCA--12/22/2011;CKD-III;Arthrectomy Cardiac Risk Factors: Carotid Disease, CVA, Family History - CAD, History of Smoking, Hypertension, LBBB, Lipids, Overweight and TIA  Symptoms:  Chest Pain, DOE and SOB   Nuclear Pre-Procedure Caffeine/Decaff Intake:  7:00pm NPO After: 5:00am   IV Site: R Hand  IV 0.9% NS with Angio Cath:  22g  Chest Size (in):  42"  IV Started by: Azucena Cecil, RN  Height: 5\' 10"  (1.778 m)  Cup Size: n/a  BMI:  Body mass index is 27.55 kg/(m^2). Weight:  192 lb (87.091 kg)   Tech Comments:  n/a    Nuclear Med Study 1 or 2 day study: 1 day  Stress Test Type:  [place] Provider:  Lyman Bishop, MD   Resting Radionuclide: Technetium 22m Sestamibi  Resting Radionuclide Dose: 10.1 mCi   Stress Radionuclide:  Technetium 22m Sestamibi  Stress Radionuclide Dose: 30.9 mCi           Stress Protocol Rest HR: 64 Stress HR: 75  Rest BP: 128/79 Stress BP: 142/79  Exercise Time (min): n/a METS: n/a   Predicted Max HR: 143 bpm % Max HR: 52.45 bpm Rate Pressure Product: [id number]  Dose of Adenosine (mg):  n/a Dose of Lexiscan: 0.4 mg  Dose of Atropine (mg): n/a Dose of Dobutamine: n/a mcg/kg/min (at max HR)  Stress Test Technologist: Leane Para, CCT Nuclear Technologist: Imagene Riches, CNMT   Rest Procedure:  Myocardial perfusion imaging was performed at rest 45 minutes following the intravenous administration of Technetium 17m Sestamibi. Stress Procedure:  The patient received IV Lexiscan 0.4 mg over 15-seconds.  Technetium 14m Sestamibi injected at  30-seconds.  The patient became hypotensive and developed Sinus Arrhythmia with drop in HR; 150 mg of IV Aminophylline was administered with resolution of symptoms.  There were no significant changes with Lexiscan.  Quantitative spect images were obtained after a 45 minute delay.  Transient Ischemic Dilatation (Normal <1.22):  1.21 Lung/Heart Ratio (Normal <0.45):  0.24 QGS EDV:  94 ml QGS ESV:  38 ml LV Ejection Fraction: 59%  Rest ECG: NSR - Normal EKG  Stress ECG: No significant change from baseline ECG  QPS Raw Data Images:  Normal; no motion artifact; normal heart/lung ratio. Stress Images:  Normal homogeneous uptake in all areas of the myocardium. Rest Images:  Normal homogeneous uptake in all areas of the myocardium. Subtraction (SDS):  No evidence of ischemia.  Impression Exercise Capacity:  Lexiscan with no exercise. BP Response:  Normal blood pressure response. Clinical Symptoms:  No significant symptoms noted. ECG Impression:  No significant ST segment change suggestive of ischemia. Comparison with Prior Nuclear Study: No significant change from previous study  Overall Impression:  Low risk stress nuclear study without ischemia. Marked sinus bradycardia and hypotension after administration of lexiscan.    LV Wall Motion:  NL LV Function; NL Wall Motion; EF 59%.  Pixie Casino, MD, [facility] Board Certified in Nuclear Cardiology Attending Cardiologist [place], MD  09/02/2013 12:38 PM

## 2013-09-08 ENCOUNTER — Encounter: Payer: Self-pay | Admitting: Internal Medicine

## 2013-09-08 ENCOUNTER — Ambulatory Visit (INDEPENDENT_AMBULATORY_CARE_PROVIDER_SITE_OTHER): Payer: Medicare PPO | Admitting: Internal Medicine

## 2013-09-08 VITALS — BP 102/56 | HR 72 | Ht 70.0 in | Wt 188.6 lb

## 2013-09-08 DIAGNOSIS — I359 Nonrheumatic aortic valve disorder, unspecified: Secondary | ICD-10-CM

## 2013-09-08 DIAGNOSIS — R0989 Other specified symptoms and signs involving the circulatory and respiratory systems: Secondary | ICD-10-CM

## 2013-09-08 DIAGNOSIS — N183 Chronic kidney disease, stage 3 unspecified: Secondary | ICD-10-CM

## 2013-09-08 DIAGNOSIS — R0609 Other forms of dyspnea: Secondary | ICD-10-CM

## 2013-09-08 DIAGNOSIS — I447 Left bundle-branch block, unspecified: Secondary | ICD-10-CM | POA: Insufficient documentation

## 2013-09-08 DIAGNOSIS — I10 Essential (primary) hypertension: Secondary | ICD-10-CM

## 2013-09-08 DIAGNOSIS — I2510 Atherosclerotic heart disease of native coronary artery without angina pectoris: Secondary | ICD-10-CM

## 2013-09-08 DIAGNOSIS — I350 Nonrheumatic aortic (valve) stenosis: Secondary | ICD-10-CM

## 2013-09-08 MED ORDER — TICAGRELOR 90 MG PO TABS: 90.0000 mg | ORAL_TABLET | Freq: Two times a day (BID) | ORAL | Status: DC

## 2013-09-08 NOTE — Progress Notes (Signed)
OFFICE NOTE  Chief Complaint:  DOE  Primary Care Physician: [name], MD  HPI:  [name] is a 80 year old gentleman who had unfortunate aggressive restenosis of an RCA lesion in the past, recently restented. He presented with worsening anginal symptoms and medical therapy was recommended in November. He was scheduled for a nuclear stress test which he underwent on July 21, 2012, and this was negative for ischemia. Recently, he had been experiencing some soreness in his left chest wall which has been going on and off for about a month. He is not experiencing the shortness of breath that he previously experienced with his angina, especially when walking up hills. He noted that he had an episode of a fall when he was trying to lift a very heavy deer, landed on his back and perhaps strained or sprained intercostal muscles or something in the chest wall. Based on his symptoms he underwent stress testing which indicated ischemia and he underwent a repeat coronary intervention to the RCA.   At his last office visit he was doing fairly well, without worsening shortness of breath or chest discomfort.  [place] noted a small increase in shortness of breath with activity. He's also had weight gain recently and some lower extremity swelling. He says his symptoms are somewhat reminiscent of the symptoms that required prior coronary intervention about one year ago, but not as significant. Of note, he does have moderate to severe aortic stenosis which were touching very closely and a history of diastolic dysfunction plus or minus heart failure.  Mr. Leiba returns today and reports his shortness of breath is improved slightly. Has not done a lot of exercise, such difficult to know as most of his symptoms are exertional. I did order a repeat nuclear stress test which she underwent and showed no ischemia. Unfortunately during that study, for which  he received LexiScan, he had a bradycardic episode associated with hypotension which appeared to be related to the medicine and resolved after the administration of Aminophylline.  I have started him on daily Lasix due to some lower extremity swelling. His BMP indicated mildly elevated creatinine but his BNP was very low. He does not think that the additional diuretic helped him much  PMHx:  Past Medical History  Diagnosis Date  . Shortness of breath   . Chronic kidney disease   . Coronary artery disease   . Hypertension   . Stroke     hx of TIA  . Blood transfusion     "  no reaction to transfusion "  . Dysrhythmia     LLB  anf leaking valve per patient  . Dyspnea on exertion, anginal equivilant 10/16/2010    2D Echo - EF >55%, mild-moderate mitral regurgiation, mild-moderate tricuspid regurgitation, moderate calcification of the aortic leaflets  . CAD (coronary artery disease), CABG 1996. 09/23/2011    2D Echo - EF >55%, moderate calcification of the aortic valve leaflets  . HTN (hypertension),uncontrolled 09/16/2011  . CKD (chronic kidney disease) stage 2, GFR 60-89 ml/min 09/16/2011  . Pulmonary hypertension, mild 09/17/2011  . Angina effort, 12/2011 with DOE anginal equivlent 12/22/2011  . Abnormal cardiovascular stress test, 11/2011 wint inferolateral reveribility 12/22/2011  . Recurrent angina status post rotational atherectomy, with PTCA for ISR to RCA, 12/22/2011 12/22/2011  . Carotid artery disease, Rt. CEA, 08/04/07, Lt. CEA 04/26/07. 12/23/2011  . Crescendo angina 06/09/2012  . CVA (cerebral vascular  accident) 04/22/2007    2D Echo - EF 50-55%, aortic valve thickness mild-moderately increased,    Past Surgical History  Procedure Laterality Date  . Coronary artery bypass graft  08/04/2007  . Cardiac catheterization Left 06/10/2012    Ostial RCA 99% stenosis pre-dilation 3x88mm Angiosculpt PTCA balloon, 3.5x54mm Promus Element DES overlapping proximal stent and into the Aorto-ostium,  post-dilated with4x63mm [place] Trek balloon - 99% stenosis reduced to 0%  . Coronary angioplasty    . Cea  2008  . Cardiac catheterization Left 12/22/2011    Left Main-diffuse tapering 50-60% stenosis; mild-moderate diseaseof the right ventricle branch of the right coronary artery; will plan FFR of ostial RCA  . Fractional flow reserve wire  12/22/2011    Ostial RCA, FFR ratio-0.82, physiologically significant, 3.25x86mm Flextome Cutting balloon deployed at ~3.37mm final estimated diameter  . Cardiac catheterization Bilateral 09/16/2011    Severe 90% ostial RCA disease which is calcified, will likely need PCI to ostial RCA with calcification  . Cardiac catheterization  09/18/2011    Calcified ostial 90-95% proximal RCA stenosis, 3x40mm Emerge balloon predilation, 3x45mm DES PROMUS Element stent was placed, post stent dilation done utilizing a 3.25x37mm noncompliant Quantum Apex balloon, 90-95% stenosis reduced to 0%  .  fluid knee Right Sept. 6, 2014    Fluid removed    FAMHx:  Family History  Problem Relation Age of Onset  . Cancer Mother   . Heart disease Mother   . Stroke Mother   . Alcohol abuse Father   . Depression Father   . Alzheimer's disease Father   . Heart disease Brother     Heart bypass in 1996  . Cancer Brother     Prostate  . Cancer Sister     SOCHx:   reports that he has quit smoking. His smoking use included Cigarettes. He smoked 0.00 packs per day. He has never used smokeless tobacco. He reports that he does not drink alcohol or use illicit drugs.  ALLERGIES:  Allergies  Allergen Reactions  . Atorvastatin Other (See Comments)    Muscle and joint pain, can tolerate rosuvastatin    ROS: A comprehensive review of systems was negative except for: Respiratory: positive for dyspnea on exertion  HOME MEDS: Current Outpatient Prescriptions  Medication Sig Dispense Refill  . amLODipine (NORVASC) 10 MG tablet Take 1 tablet (10 mg total) by mouth at bedtime.  30 tablet  6    . aspirin EC 81 MG tablet Take 81 mg by mouth daily.      . clotrimazole-betamethasone (LOTRISONE) cream 3 (three) times daily as needed.      . ezetimibe (ZETIA) 10 MG tablet Take 10 mg by mouth daily.      . fish oil-omega-3 fatty acids 1000 MG capsule Take 1 g by mouth 3 (three) times daily.      . furosemide (LASIX) 40 MG tablet Take 40 mg by mouth daily as needed.       . isosorbide mononitrate (IMDUR) 60 MG 24 hr tablet Take 1.5 tablets (90 mg total) by mouth daily. 1 1/2 tablet daily  45 tablet  5  . metoprolol tartrate (LOPRESSOR) 25 MG tablet Take 25 mg by mouth 2 (two) times daily.      . niacin (NIASPAN) 500 MG CR tablet Take 500 mg by mouth at bedtime.      Marland Kitchen NITROSTAT 0.4 MG SL tablet Place 1 tablet under the tongue every 5 (five) minutes x 3 doses as needed.      Marland Kitchen  rosuvastatin (CRESTOR) 20 MG tablet Take 20 mg by mouth daily.      . Ticagrelor (BRILINTA) 90 MG TABS tablet Take 1 tablet (90 mg total) by mouth 2 (two) times daily.  48 tablet  0   No current facility-administered medications for this visit.   Facility-Administered Medications Ordered in Other Visits  Medication Dose Route Frequency Provider Last Rate Last Dose  . sodium chloride 0.9 % injection 3 mL  3 mL Intravenous PRN Pixie Casino, MD        LABS/IMAGING: No results found for this or any previous visit (from the past 48 hour(s)). No results found.  VITALS: BP 102/56  Pulse 72  Ht 5\' 10"  (1.778 m)  Wt 188 lb 9.6 oz (85.548 kg)  BMI 27.06 kg/m2  EXAM: deferred  EKG: deferred  ASSESSMENT: 1. Coronary artery disease with multiple recent PCI to the right coronary 2. History of bilateral carotid endarterectomies 3. Hypertension 4. Dyslipidemia 5. Left bundle branch block 6. Stage III chronic kidney disease 7. Moderate aortic stenosis 8. DOE - negative nuclear stress test 09/02/2013  PLAN: 1.   Mr. Payant has had some improvement in his symptoms. He was diureses to little with additional  Lasix, but I feel that he can go back to taking Lasix an as-needed basis. His BNP was very low. He still has at least moderate aortic stenosis that we are continuing to follow. Unfortunately, he did have symptomatic bradycardia with LexiScan, probably due to an exacerbation of his underlying left bundle branch block. He did recover quickly with reversal of Aminophylline.  Overall he feels fairly well, and I recommend we continue his current medicines are now seen back in 6 months.  Thanks as always for allowing me to participate in his care.  Pixie Casino, MD, [facility] Attending Cardiologist [facility]  [name],Kenneth C 09/08/2013, 9:41 AM

## 2013-09-08 NOTE — Patient Instructions (Signed)
Your physician wants you to follow-up in:  6 months. You will receive a reminder letter in the mail two months in advance. If you don't receive a letter, please call our office to schedule the follow-up appointment.   

## 2013-10-12 ENCOUNTER — Telehealth: Payer: Self-pay | Admitting: Internal Medicine

## 2013-10-12 MED ORDER — TICAGRELOR 90 MG PO TABS: 90.0000 mg | ORAL_TABLET | Freq: Two times a day (BID) | ORAL | Status: DC

## 2013-10-12 NOTE — Telephone Encounter (Signed)
Samples left at front. Left VM to inform patient

## 2013-10-12 NOTE — Telephone Encounter (Signed)
Would like some samples of Berlinta 90 mg please.

## 2013-11-22 ENCOUNTER — Telehealth: Payer: Self-pay | Admitting: *Deleted

## 2013-11-22 MED ORDER — TICAGRELOR 90 MG PO TABS: 90.0000 mg | ORAL_TABLET | Freq: Two times a day (BID) | ORAL | Status: DC

## 2013-11-22 NOTE — Telephone Encounter (Signed)
Samples given to [employer].

## 2013-12-28 ENCOUNTER — Ambulatory Visit (INDEPENDENT_AMBULATORY_CARE_PROVIDER_SITE_OTHER): Payer: Medicare PPO | Admitting: Ophthalmology

## 2013-12-28 DIAGNOSIS — H43819 Vitreous degeneration, unspecified eye: Secondary | ICD-10-CM

## 2013-12-28 DIAGNOSIS — I10 Essential (primary) hypertension: Secondary | ICD-10-CM

## 2013-12-28 DIAGNOSIS — H2510 Age-related nuclear cataract, unspecified eye: Secondary | ICD-10-CM

## 2013-12-28 DIAGNOSIS — H35039 Hypertensive retinopathy, unspecified eye: Secondary | ICD-10-CM

## 2013-12-28 DIAGNOSIS — H348392 Tributary (branch) retinal vein occlusion, unspecified eye, stable: Secondary | ICD-10-CM

## 2014-01-03 ENCOUNTER — Telehealth: Payer: Self-pay | Admitting: *Deleted

## 2014-01-03 MED ORDER — TICAGRELOR 90 MG PO TABS: 90.0000 mg | ORAL_TABLET | Freq: Two times a day (BID) | ORAL | Status: DC

## 2014-01-03 NOTE — Telephone Encounter (Signed)
5 SAMPLE BOTTLES  GIVEN TO PATIENT.

## 2014-01-11 ENCOUNTER — Telehealth: Payer: Self-pay | Admitting: Internal Medicine

## 2014-01-20 NOTE — Telephone Encounter (Signed)
Closed encounter °

## 2014-01-31 ENCOUNTER — Telehealth: Payer: Self-pay | Admitting: Internal Medicine

## 2014-01-31 MED ORDER — TICAGRELOR 90 MG PO TABS: 90.0000 mg | ORAL_TABLET | Freq: Two times a day (BID) | ORAL | Status: DC

## 2014-01-31 NOTE — Telephone Encounter (Signed)
Patient notified that samples are at front desk.

## 2014-01-31 NOTE — Telephone Encounter (Signed)
Wants some samples of Brilinta ?mg please. He did not know the milligram.

## 2014-02-09 ENCOUNTER — Telehealth: Payer: Self-pay | Admitting: Internal Medicine

## 2014-02-09 MED ORDER — TICAGRELOR 90 MG PO TABS: 90.0000 mg | ORAL_TABLET | Freq: Two times a day (BID) | ORAL | Status: DC

## 2014-02-09 NOTE — Telephone Encounter (Signed)
Pt wants to know if we have any more samples of Brilinta 90 mg please.

## 2014-02-27 ENCOUNTER — Other Ambulatory Visit: Payer: Self-pay

## 2014-02-27 MED ORDER — ISOSORBIDE MONONITRATE ER 60 MG PO TB24: 90.0000 mg | ORAL_TABLET | Freq: Every day | ORAL | Status: DC

## 2014-02-27 NOTE — Telephone Encounter (Signed)
Rx was sent to pharmacy electronically. 

## 2014-03-08 ENCOUNTER — Telehealth: Payer: Self-pay | Admitting: Internal Medicine

## 2014-03-08 NOTE — Telephone Encounter (Signed)
Pt would like some samples of Brilinta 90 mg please.

## 2014-03-15 ENCOUNTER — Encounter: Payer: Self-pay | Admitting: Internal Medicine

## 2014-03-15 ENCOUNTER — Ambulatory Visit (INDEPENDENT_AMBULATORY_CARE_PROVIDER_SITE_OTHER): Payer: Medicare PPO | Admitting: Internal Medicine

## 2014-03-15 VITALS — BP 120/62 | HR 51 | Ht 69.5 in | Wt 183.8 lb

## 2014-03-15 DIAGNOSIS — I739 Peripheral vascular disease, unspecified: Secondary | ICD-10-CM

## 2014-03-15 DIAGNOSIS — I350 Nonrheumatic aortic (valve) stenosis: Secondary | ICD-10-CM

## 2014-03-15 DIAGNOSIS — I359 Nonrheumatic aortic valve disorder, unspecified: Secondary | ICD-10-CM

## 2014-03-15 DIAGNOSIS — I447 Left bundle-branch block, unspecified: Secondary | ICD-10-CM

## 2014-03-15 DIAGNOSIS — I779 Disorder of arteries and arterioles, unspecified: Secondary | ICD-10-CM

## 2014-03-15 DIAGNOSIS — I2510 Atherosclerotic heart disease of native coronary artery without angina pectoris: Secondary | ICD-10-CM

## 2014-03-15 DIAGNOSIS — I2789 Other specified pulmonary heart diseases: Secondary | ICD-10-CM

## 2014-03-15 DIAGNOSIS — I2583 Coronary atherosclerosis due to lipid rich plaque: Secondary | ICD-10-CM

## 2014-03-15 DIAGNOSIS — I272 Pulmonary hypertension, unspecified: Secondary | ICD-10-CM

## 2014-03-15 MED ORDER — TICAGRELOR 90 MG PO TABS: 90.0000 mg | ORAL_TABLET | Freq: Two times a day (BID) | ORAL | Status: DC

## 2014-03-15 NOTE — Patient Instructions (Signed)
Your physician wants you to follow-up in:  6 months. You will receive a reminder letter in the mail two months in advance. If you don't receive a letter, please call our office to schedule the follow-up appointment.   

## 2014-03-15 NOTE — Progress Notes (Signed)
OFFICE NOTE  Chief Complaint:  DOE  Primary Care Physician: [name], MD  HPI:  [name] is a 80 year old gentleman who had unfortunate aggressive restenosis of an RCA lesion in the past, recently restented. He presented with worsening anginal symptoms and medical therapy was recommended in November. He was scheduled for a nuclear stress test which he underwent on July 21, 2012, and this was negative for ischemia. Recently, he had been experiencing some soreness in his left chest wall which has been going on and off for about a month. He is not experiencing the shortness of breath that he previously experienced with his angina, especially when walking up hills. He noted that he had an episode of a fall when he was trying to lift a very heavy deer, landed on his back and perhaps strained or sprained intercostal muscles or something in the chest wall. Based on his symptoms he underwent stress testing which indicated ischemia and he underwent a repeat coronary intervention to the RCA.   At his last office visit he was doing fairly well, without worsening shortness of breath or chest discomfort.  [place] did [place] but noted a small increase in shortness of breath with activity. He's also had weight gain recently and some lower extremity swelling. He says his symptoms are somewhat reminiscent of the symptoms that required prior coronary intervention about one year ago, but not as significant. Of note, he does have moderate to severe aortic stenosis which were touching very closely and a history of diastolic dysfunction plus or minus heart failure.  [name] returns today and is feeling quite well. He denies any chest pain or shortness of breath. He told me of an episode recently where he helped deliver car and had to walk back home. Walked about 2 half miles during the middle of the day in the heat and had no problems or shortness of breath or  chest discomfort. He actually is feeling the best he has in some time.  PMHx:  Past Medical History  Diagnosis Date  . Shortness of breath   . Chronic kidney disease   . Coronary artery disease   . Hypertension   . Stroke     hx of TIA  . Blood transfusion     "  no reaction to transfusion "  . Dysrhythmia     LLB  anf leaking valve per patient  . Dyspnea on exertion, anginal equivilant 10/16/2010    2D Echo - EF >55%, mild-moderate mitral regurgiation, mild-moderate tricuspid regurgitation, moderate calcification of the aortic leaflets  . CAD (coronary artery disease), CABG 1996. 09/23/2011    2D Echo - EF >55%, moderate calcification of the aortic valve leaflets  . HTN (hypertension),uncontrolled 09/16/2011  . CKD (chronic kidney disease) stage 2, GFR 60-89 ml/min 09/16/2011  . Pulmonary hypertension, mild 09/17/2011  . Angina effort, 12/2011 with DOE anginal equivlent 12/22/2011  . Abnormal cardiovascular stress test, 11/2011 wint inferolateral reveribility 12/22/2011  . Recurrent angina status post rotational atherectomy, with PTCA for ISR to RCA, 12/22/2011 12/22/2011  . Carotid artery disease, Rt. CEA, 08/04/07, Lt. CEA 04/26/07. 12/23/2011  . Crescendo angina 06/09/2012  . CVA (cerebral vascular accident) 04/22/2007    2D Echo - EF 50-55%, aortic valve thickness mild-moderately increased,    Past Surgical History  Procedure Laterality Date  . Coronary artery bypass graft  08/04/2007  . Cardiac catheterization Left 06/10/2012    Ostial  RCA 99% stenosis pre-dilation 3x42mm Angiosculpt PTCA balloon, 3.5x65mm Promus Element DES overlapping proximal stent and into the Aorto-ostium, post-dilated with4x104mm [place] Trek balloon - 99% stenosis reduced to 0%  . Coronary angioplasty    . Cea  2008  . Cardiac catheterization Left 12/22/2011    Left Main-diffuse tapering 50-60% stenosis; mild-moderate diseaseof the right ventricle branch of the right coronary artery; will plan FFR of ostial RCA  . Fractional  flow reserve wire  12/22/2011    Ostial RCA, FFR ratio-0.82, physiologically significant, 3.25x29mm Flextome Cutting balloon deployed at ~3.110mm final estimated diameter  . Cardiac catheterization Bilateral 09/16/2011    Severe 90% ostial RCA disease which is calcified, will likely need PCI to ostial RCA with calcification  . Cardiac catheterization  09/18/2011    Calcified ostial 90-95% proximal RCA stenosis, 3x86mm Emerge balloon predilation, 3x74mm DES PROMUS Element stent was placed, post stent dilation done utilizing a 3.25x61mm noncompliant Quantum Apex balloon, 90-95% stenosis reduced to 0%  .  fluid knee Right Sept. 6, 2014    Fluid removed    FAMHx:  Family History  Problem Relation Age of Onset  . Cancer Mother   . Heart disease Mother   . Stroke Mother   . Alcohol abuse Father   . Depression Father   . Alzheimer's disease Father   . Heart disease Brother     Heart bypass in 1996  . Cancer Brother     Prostate  . Cancer Sister     SOCHx:   reports that he has quit smoking. His smoking use included Cigarettes. He smoked 0.00 packs per day. He has never used smokeless tobacco. He reports that he does not drink alcohol or use illicit drugs.  ALLERGIES:  Allergies  Allergen Reactions  . Atorvastatin Other (See Comments)    Muscle and joint pain, can tolerate rosuvastatin    ROS: A comprehensive review of systems was negative.  HOME MEDS: Current Outpatient Prescriptions  Medication Sig Dispense Refill  . amLODipine (NORVASC) 10 MG tablet Take 1 tablet (10 mg total) by mouth at bedtime.  30 tablet  6  . aspirin EC 81 MG tablet Take 81 mg by mouth daily.      . clotrimazole-betamethasone (LOTRISONE) cream 3 (three) times daily as needed.      . ezetimibe (ZETIA) 10 MG tablet Take 10 mg by mouth daily.      . fish oil-omega-3 fatty acids 1000 MG capsule Take 1 g by mouth 3 (three) times daily.      . furosemide (LASIX) 40 MG tablet Take 40 mg by mouth daily as needed.        . isosorbide mononitrate (IMDUR) 60 MG 24 hr tablet Take 90 mg by mouth daily.      . metoprolol tartrate (LOPRESSOR) 25 MG tablet Take 25 mg by mouth 2 (two) times daily.      . niacin (NIASPAN) 500 MG CR tablet Take 500 mg by mouth at bedtime.      Marland Kitchen NITROSTAT 0.4 MG SL tablet Place 1 tablet under the tongue every 5 (five) minutes x 3 doses as needed.      . ondansetron (ZOFRAN-ODT) 4 MG disintegrating tablet as needed.      . rosuvastatin (CRESTOR) 20 MG tablet Take 20 mg by mouth daily.      . ticagrelor (BRILINTA) 90 MG TABS tablet Take 1 tablet (90 mg total) by mouth 2 (two) times daily.  32 tablet  0   No  current facility-administered medications for this visit.   Facility-Administered Medications Ordered in Other Visits  Medication Dose Route Frequency Provider Last Rate Last Dose  . sodium chloride 0.9 % injection 3 mL  3 mL Intravenous PRN Pixie Casino, MD        LABS/IMAGING: No results found for this or any previous visit (from the past 48 hour(s)). No results found.  VITALS: BP 120/62  Pulse 51  Ht 5' 9.5" (1.765 m)  Wt 183 lb 12.8 oz (83.371 kg)  BMI 26.76 kg/m2  EXAM: GEN: Awake, in no distress HEENT: PERRLA, EOMI Lungs: Clear bilaterally Cardiovascular: Regular rate and rhythm, normal S1, S2, 3/6 mid to late peaking systolic murmur at the right upper shoulder border Abdomen: Soft, nontender Extremities: No edema Neurologic: Grossly nonfocal Psychiatric: Normal mood and affect   EKG: sinus bradycardia at 51 with a left bundle branch block  ASSESSMENT: 1. Coronary artery disease with multiple recent PCI to the right coronary 2. History of bilateral carotid endarterectomies 3. Hypertension 4. Dyslipidemia 5. Left bundle branch block 6. Stage III chronic kidney disease 7. Moderate aortic stenosis 8. DOE - negative nuclear stress test 09/02/2013  PLAN: 1.   [name] is feeling quite well. He is generally done well over the past 6 months and has had  no new complaints. He does have moderate aortic stenosis we need to continue to survey his valve. Plan to see him back in 6 months and he may be due for repeat echocardiogram at that time.  Thanks as always for allowing me to participate in his care.  Pixie Casino, MD, [facility] Attending Cardiologist [facility]  Darleen Moffitt C 03/15/2014, 10:02 AM

## 2014-05-09 ENCOUNTER — Telehealth: Payer: Self-pay | Admitting: Internal Medicine

## 2014-05-09 NOTE — Telephone Encounter (Signed)
Pt called in stating that he would like some samples of Brilinta if we have any. Please call  Thanks

## 2014-05-09 NOTE — Telephone Encounter (Signed)
Left VM that no samples are available. Advised to call back later this week or early next week.

## 2014-05-11 ENCOUNTER — Encounter: Payer: Self-pay | Admitting: Internal Medicine

## 2014-05-12 ENCOUNTER — Telehealth: Payer: Self-pay | Admitting: Internal Medicine

## 2014-05-12 DIAGNOSIS — I2583 Coronary atherosclerosis due to lipid rich plaque: Principal | ICD-10-CM

## 2014-05-12 DIAGNOSIS — I2510 Atherosclerotic heart disease of native coronary artery without angina pectoris: Secondary | ICD-10-CM

## 2014-05-12 MED ORDER — TICAGRELOR 90 MG PO TABS
90.0000 mg | ORAL_TABLET | Freq: Two times a day (BID) | ORAL | Status: DC
Start: 2014-05-12 — End: 2014-06-06

## 2014-05-12 NOTE — Telephone Encounter (Signed)
Pt would like some samples of Brilinta 90 mg please.

## 2014-05-12 NOTE — Telephone Encounter (Signed)
LEFT MESSAGE on voicemail SAMPLES AVAILABLE TO PICK UP (2 bottles)

## 2014-06-06 ENCOUNTER — Telehealth: Payer: Self-pay | Admitting: Internal Medicine

## 2014-06-06 DIAGNOSIS — I2583 Coronary atherosclerosis due to lipid rich plaque: Principal | ICD-10-CM

## 2014-06-06 DIAGNOSIS — I2510 Atherosclerotic heart disease of native coronary artery without angina pectoris: Secondary | ICD-10-CM

## 2014-06-06 MED ORDER — TICAGRELOR 90 MG PO TABS
90.0000 mg | ORAL_TABLET | Freq: Two times a day (BID) | ORAL | Status: DC
Start: 2014-06-06 — End: 2014-06-07

## 2014-06-06 NOTE — Telephone Encounter (Signed)
LM that 1 sample (8 tabs = 4 days) pack was available. Left at front desk. Left message with [facility] [facility] Office phone # so patient could contact them as well

## 2014-06-06 NOTE — Telephone Encounter (Signed)
Pt would like some samples of Brilanta 90 mg please.

## 2014-06-07 ENCOUNTER — Other Ambulatory Visit: Payer: Self-pay | Admitting: *Deleted

## 2014-06-07 DIAGNOSIS — I2583 Coronary atherosclerosis due to lipid rich plaque: Principal | ICD-10-CM

## 2014-06-07 DIAGNOSIS — I2510 Atherosclerotic heart disease of native coronary artery without angina pectoris: Secondary | ICD-10-CM

## 2014-06-07 MED ORDER — TICAGRELOR 90 MG PO TABS
90.0000 mg | ORAL_TABLET | Freq: Two times a day (BID) | ORAL | Status: DC
Start: 2014-06-07 — End: 2014-08-07

## 2014-06-07 NOTE — Telephone Encounter (Signed)
#  32 samples provided (with previous #8 samples) = 20 days  Patient aware

## 2014-06-08 ENCOUNTER — Encounter: Payer: Self-pay | Admitting: Internal Medicine

## 2014-06-16 ENCOUNTER — Encounter: Payer: Self-pay | Admitting: Family

## 2014-06-19 ENCOUNTER — Ambulatory Visit (INDEPENDENT_AMBULATORY_CARE_PROVIDER_SITE_OTHER): Payer: Medicare PPO | Admitting: Family

## 2014-06-19 ENCOUNTER — Ambulatory Visit (HOSPITAL_COMMUNITY)
Admission: RE | Admit: 2014-06-19 | Discharge: 2014-06-19 | Disposition: A | Discharge status: Home / Self Care | Payer: Medicare PPO | Source: Ambulatory Visit | Attending: Family | Admitting: Family

## 2014-06-19 ENCOUNTER — Encounter: Payer: Self-pay | Admitting: Family

## 2014-06-19 VITALS — BP 138/74 | HR 54 | Resp 14 | Ht 69.5 in | Wt 184.0 lb

## 2014-06-19 DIAGNOSIS — I6523 Occlusion and stenosis of bilateral carotid arteries: Secondary | ICD-10-CM | POA: Insufficient documentation

## 2014-06-19 DIAGNOSIS — Z48812 Encounter for surgical aftercare following surgery on the circulatory system: Secondary | ICD-10-CM | POA: Insufficient documentation

## 2014-06-19 DIAGNOSIS — I6529 Occlusion and stenosis of unspecified carotid artery: Secondary | ICD-10-CM | POA: Insufficient documentation

## 2014-06-19 NOTE — Addendum Note (Signed)
Addended by: Mena Goes on: 06/19/2014 03:42 PM   Modules accepted: Orders

## 2014-06-19 NOTE — Patient Instructions (Signed)
Stroke Prevention Some medical conditions and behaviors are associated with an increased chance of having a stroke. You may prevent a stroke by making healthy choices and managing medical conditions. HOW CAN I REDUCE MY RISK OF HAVING A STROKE?   Stay physically active. Get at least 30 minutes of activity on most or all days.  Do not smoke. It may also be helpful to avoid exposure to secondhand smoke.  Limit alcohol use. Moderate alcohol use is considered to be:  No more than 2 drinks per day for men.  No more than 1 drink per day for nonpregnant women.  Eat healthy foods. This involves:  Eating 5 or more servings of fruits and vegetables a day.  Making dietary changes that address high blood pressure (hypertension), high cholesterol, diabetes, or obesity.  Manage your cholesterol levels.  Making food choices that are high in fiber and low in saturated fat, trans fat, and cholesterol may control cholesterol levels.  Take any prescribed medicines to control cholesterol as directed by your health care provider.  Manage your diabetes.  Controlling your carbohydrate and sugar intake is recommended to manage diabetes.  Take any prescribed medicines to control diabetes as directed by your health care provider.  Control your hypertension.  Making food choices that are low in salt (sodium), saturated fat, trans fat, and cholesterol is recommended to manage hypertension.  Take any prescribed medicines to control hypertension as directed by your health care provider.  Maintain a healthy weight.  Reducing calorie intake and making food choices that are low in sodium, saturated fat, trans fat, and cholesterol are recommended to manage weight.  Stop drug abuse.  Avoid taking birth control pills.  Talk to your health care provider about the risks of taking birth control pills if you are over 35 years old, smoke, get migraines, or have ever had a blood clot.  Get evaluated for sleep  disorders (sleep apnea).  Talk to your health care provider about getting a sleep evaluation if you snore a lot or have excessive sleepiness.  Take medicines only as directed by your health care provider.  For some people, aspirin or blood thinners (anticoagulants) are helpful in reducing the risk of forming abnormal blood clots that can lead to stroke. If you have the irregular heart rhythm of atrial fibrillation, you should be on a blood thinner unless there is a good reason you cannot take them.  Understand all your medicine instructions.  Make sure that other conditions (such as anemia or atherosclerosis) are addressed. SEEK IMMEDIATE MEDICAL CARE IF:   You have sudden weakness or numbness of the face, arm, or leg, especially on one side of the body.  Your face or eyelid droops to one side.  You have sudden confusion.  You have trouble speaking (aphasia) or understanding.  You have sudden trouble seeing in one or both eyes.  You have sudden trouble walking.  You have dizziness.  You have a loss of balance or coordination.  You have a sudden, severe headache with no known cause.  You have new chest pain or an irregular heartbeat. Any of these symptoms may represent a serious problem that is an emergency. Do not wait to see if the symptoms will go away. Get medical help at once. Call your local emergency services (911 in [place] Do not drive yourself to the hospital. Document Released: 09/11/2004 Document Revised: 12/19/2013 Document Reviewed: 02/04/2013 ExitCare Patient Information 2015 [employer]. This information is not intended to replace advice given   to you by your health care provider. Make sure you discuss any questions you have with your health care provider.  

## 2014-06-19 NOTE — Progress Notes (Signed)
Established Carotid Patient   History of Present Illness  [name] is a 80 y.o. male patient of Dr. Trula Slade who is status post bilateral CEAs in 2008.  He returns today for follow up. He has 2 cardiac stents, denies any history of an MI. His cardiologist is Dr. Debara Pickett.  He reports a TIA before Sept. 2008 as manifested by right arm weakness which has resolved, denies strokes or TIA symptoms since he had the CEA's  The patient denies amaurosis fugax or monocular blindness. The patient denies facial drooping. The patient denies receptive or expressive aphasia. Pt. denies extremity weakness.  He is quite physically active, denies claudication symptoms with walking.  The patient denies New Medical or Surgical History.  Pt Diabetic: No Pt smoker: non-smoker  Pt meds include: Statin : Yes Betablocker: Yes ASA: Yes Other anticoagulants/antiplatelets: Brilinta since last cardiac stent placed.    Past Medical History  Diagnosis Date  . Shortness of breath   . Chronic kidney disease   . Coronary artery disease   . Hypertension   . Stroke     hx of TIA  . Blood transfusion     "  no reaction to transfusion "  . Dysrhythmia     LLB  anf leaking valve per patient  . Dyspnea on exertion, anginal equivilant 10/16/2010    2D Echo - EF >55%, mild-moderate mitral regurgiation, mild-moderate tricuspid regurgitation, moderate calcification of the aortic leaflets  . CAD (coronary artery disease), CABG 1996. 09/23/2011    2D Echo - EF >55%, moderate calcification of the aortic valve leaflets  . HTN (hypertension),uncontrolled 09/16/2011  . CKD (chronic kidney disease) stage 2, GFR 60-89 ml/min 09/16/2011  . Pulmonary hypertension, mild 09/17/2011  . Angina effort, 12/2011 with DOE anginal equivlent 12/22/2011  . Abnormal cardiovascular stress test, 11/2011 wint inferolateral reveribility 12/22/2011  . Recurrent angina status post rotational atherectomy, with PTCA for ISR to RCA, 12/22/2011  12/22/2011  . Carotid artery disease, Rt. CEA, 08/04/07, Lt. CEA 04/26/07. 12/23/2011  . Crescendo angina 06/09/2012  . CVA (cerebral vascular accident) 04/22/2007    2D Echo - EF 50-55%, aortic valve thickness mild-moderately increased,  . Peripheral arterial disease     Social History History  Substance Use Topics  . Smoking status: Never Smoker   . Smokeless tobacco: Never Used     Comment: rarely smoked when he did smoke   . Alcohol Use: No    Family History Family History  Problem Relation Age of Onset  . Cancer Mother   . Heart disease Mother   . Stroke Mother   . Alcohol abuse Father   . Depression Father   . Alzheimer's disease Father   . Heart disease Brother     Heart bypass in 1996  . Cancer Brother     Prostate  . Cancer Sister     Surgical History Past Surgical History  Procedure Laterality Date  . Coronary artery bypass graft  08/04/2007  . Cardiac catheterization Left 06/10/2012    Ostial RCA 99% stenosis pre-dilation 3x70mm Angiosculpt PTCA balloon, 3.5x58mm Promus Element DES overlapping proximal stent and into the Aorto-ostium, post-dilated with4x61mm [place] Trek balloon - 99% stenosis reduced to 0%  . Coronary angioplasty    . Cea  2008  . Cardiac catheterization Left 12/22/2011    Left Main-diffuse tapering 50-60% stenosis; mild-moderate diseaseof the right ventricle branch of the right coronary artery; will plan FFR of ostial RCA  . Fractional flow reserve wire  12/22/2011  Ostial RCA, FFR ratio-0.82, physiologically significant, 3.25x74mm Flextome Cutting balloon deployed at ~3.68mm final estimated diameter  . Cardiac catheterization Bilateral 09/16/2011    Severe 90% ostial RCA disease which is calcified, will likely need PCI to ostial RCA with calcification  . Cardiac catheterization  09/18/2011    Calcified ostial 90-95% proximal RCA stenosis, 3x52mm Emerge balloon predilation, 3x17mm DES PROMUS Element stent was placed, post stent dilation done utilizing a  3.25x55mm noncompliant Quantum Apex balloon, 90-95% stenosis reduced to 0%  .  fluid knee Right Sept. 6, 2014    Fluid removed    Allergies  Allergen Reactions  . Atorvastatin Other (See Comments)    Muscle and joint pain, can tolerate rosuvastatin    Current Outpatient Prescriptions  Medication Sig Dispense Refill  . amLODipine (NORVASC) 10 MG tablet Take 1 tablet (10 mg total) by mouth at bedtime. 30 tablet 6  . aspirin EC 81 MG tablet Take 81 mg by mouth daily.    Marland Kitchen ezetimibe (ZETIA) 10 MG tablet Take 10 mg by mouth daily.    . fish oil-omega-3 fatty acids 1000 MG capsule Take 1 g by mouth 3 (three) times daily.    . isosorbide mononitrate (IMDUR) 60 MG 24 hr tablet Take 90 mg by mouth daily.    . metoprolol tartrate (LOPRESSOR) 25 MG tablet Take 25 mg by mouth 2 (two) times daily.    . niacin (NIASPAN) 500 MG CR tablet Take 500 mg by mouth at bedtime.    . rosuvastatin (CRESTOR) 20 MG tablet Take 20 mg by mouth daily.    . ticagrelor (BRILINTA) 90 MG TABS tablet Take 1 tablet (90 mg total) by mouth 2 (two) times daily. 32 tablet 0  . clotrimazole-betamethasone (LOTRISONE) cream 3 (three) times daily as needed.    . furosemide (LASIX) 40 MG tablet Take 40 mg by mouth daily as needed.     Marland Kitchen NITROSTAT 0.4 MG SL tablet Place 1 tablet under the tongue every 5 (five) minutes x 3 doses as needed.    . ondansetron (ZOFRAN-ODT) 4 MG disintegrating tablet as needed.     No current facility-administered medications for this visit.   Facility-Administered Medications Ordered in Other Visits  Medication Dose Route Frequency Provider Last Rate Last Dose  . sodium chloride 0.9 % injection 3 mL  3 mL Intravenous PRN Pixie Casino, MD        Review of Systems : See HPI for pertinent positives and negatives.  Physical Examination  Filed Vitals:   06/19/14 1353 06/19/14 1355  BP: 130/79 138/74  Pulse: 62 54  Resp:  14  Height:  5' 9.5" (1.765 m)  Weight:  184 lb (83.462 kg)  SpO2:   100%   Body mass index is 26.79 kg/(m^2).  General: WDWN male in NAD GAIT: normal Eyes: PERRLA Pulmonary: CTAB, Negative Rales, Negative rhonchi, & Negative wheezing.  Cardiac: regular rhythm,positive detected murmur.  VASCULAR EXAM Carotid Bruits Left Right   Transmitted cardiac murmur Transmitted cardiac murmur   Aorta is not palpable. Radial pulses are 2+ palpable and equal.      LE Pulses LEFT RIGHT   POPLITEAL not palpable  not palpable   POSTERIOR TIBIAL  palpable   palpable    DORSALIS PEDIS  ANTERIOR TIBIAL palpable  palpable     Gastrointestinal: soft, nontender, BS WNL, no r/g, no palpated masses.  Musculoskeletal: Negative muscle atrophy/wasting. M/S 5/5 throughout, Extremities without ischemic changes.  Neurologic: A&O X 3; Appropriate Affect ;  SENSATION ; Speech is normal CN 2-12 intact except for mild right sided facial droop, Pain and light touch intact in extremities, Motor exam as listed above.   Non-Invasive Vascular Imaging CAROTID DUPLEX 06/19/2014   CEREBROVASCULAR DUPLEX EVALUATION    INDICATION: Follow-up carotid artery disease     PREVIOUS INTERVENTION(S): Right carotid endarterectomy 07/25/2007 Left carotid endarterectomy 04/26/2007    DUPLEX EXAM:     RIGHT  LEFT  Peak Systolic Velocities (cm/s) End Diastolic Velocities (cm/s) Plaque LOCATION Peak Systolic Velocities (cm/s) End Diastolic Velocities (cm/s) Plaque  90 16  CCA PROXIMAL 91 19   69 17  CCA MID 73 20   60 17  CCA DISTAL 79 24   75 11  ECA 92 11   75 21  ICA PROXIMAL 92 23   67 22  ICA MID 75 28   75 27  ICA DISTAL 76 31     NA ICA / CCA Ratio (PSV) NA  Antegrade  Vertebral Flow Antegrade    Brachial Systolic Pressure (mmHg)   Within normal limits  Brachial Artery Waveforms Within normal limits      Plaque Morphology:  HM = Homogeneous, HT = Heterogeneous, CP = Calcific Plaque, SP = Smooth Plaque, IP = Irregular Plaque     ADDITIONAL FINDINGS:      IMPRESSION: 1. Widely patent bilateral carotid endarterectomy without evidence of restenosis or hyperplasia.  2. Bilateral vertebral artery is antegrade.    Compared to the previous exam:  No significant change compared to prior exam.       Assessment: [name] is a 80 y.o. male who is status post bilateral CEAs in 2008. He presents with asymptomatic widely patent bilateral carotid endarterectomy without evidence of restenosis or hyperplasia. No significant change compared to prior exam.   Plan: Follow-up in 1 year with Carotid Duplex scan.   I discussed in depth with the patient the nature of atherosclerosis, and emphasized the importance of maximal medical management including strict control of blood pressure, blood glucose, and lipid levels, obtaining regular exercise, and continued cessation of smoking.  The patient is aware that without maximal medical management the underlying atherosclerotic disease process will progress, limiting the benefit of any interventions. The patient was given information about stroke prevention and what symptoms should prompt the patient to seek immediate medical care. Thank you for allowing [place] to participate in this patient's care.  Clemon Chambers, RN, MSN, FNP-C [facility] Office: [phone]  Clinic Physician: Trula Slade  06/19/2014 2:06 PM

## 2014-07-12 ENCOUNTER — Other Ambulatory Visit: Payer: Self-pay | Admitting: Internal Medicine

## 2014-07-12 NOTE — Telephone Encounter (Signed)
Per Answering Service-pt would like some samples of Brilinta please.

## 2014-07-12 NOTE — Telephone Encounter (Signed)
Patient needs samples by Friday, told him we will not be open on Friday but I can have him some avb. At the [address] office for him Friday morning so he does not miss any doses

## 2014-07-27 ENCOUNTER — Encounter (HOSPITAL_COMMUNITY): Payer: Self-pay | Admitting: Internal Medicine

## 2014-08-07 ENCOUNTER — Telehealth: Payer: Self-pay | Admitting: Internal Medicine

## 2014-08-07 DIAGNOSIS — I2510 Atherosclerotic heart disease of native coronary artery without angina pectoris: Secondary | ICD-10-CM

## 2014-08-07 DIAGNOSIS — I2583 Coronary atherosclerosis due to lipid rich plaque: Principal | ICD-10-CM

## 2014-08-07 MED ORDER — TICAGRELOR 90 MG PO TABS: 90.0000 mg | ORAL_TABLET | Freq: Two times a day (BID) | ORAL | Status: DC

## 2014-08-07 NOTE — Telephone Encounter (Signed)
Pt would like some samples of Brilinta please.

## 2014-08-07 NOTE — Telephone Encounter (Signed)
Samples left at desk. Pt informed.

## 2014-08-31 ENCOUNTER — Telehealth: Payer: Self-pay | Admitting: Internal Medicine

## 2014-08-31 ENCOUNTER — Encounter (HOSPITAL_COMMUNITY): Payer: Self-pay | Admitting: Internal Medicine

## 2014-08-31 DIAGNOSIS — I2583 Coronary atherosclerosis due to lipid rich plaque: Principal | ICD-10-CM

## 2014-08-31 DIAGNOSIS — I2510 Atherosclerotic heart disease of native coronary artery without angina pectoris: Secondary | ICD-10-CM

## 2014-08-31 MED ORDER — TICAGRELOR 90 MG PO TABS: 90.0000 mg | ORAL_TABLET | Freq: Two times a day (BID) | ORAL | Status: DC

## 2014-08-31 NOTE — Telephone Encounter (Signed)
Pt would like some samples of Brilinta please.

## 2014-08-31 NOTE — Telephone Encounter (Signed)
Samples left at front desk, pt informed.

## 2014-09-11 ENCOUNTER — Ambulatory Visit: Payer: Medicare PPO | Admitting: Internal Medicine

## 2014-09-25 ENCOUNTER — Telehealth: Payer: Self-pay | Admitting: Internal Medicine

## 2014-09-25 DIAGNOSIS — I2510 Atherosclerotic heart disease of native coronary artery without angina pectoris: Secondary | ICD-10-CM

## 2014-09-25 DIAGNOSIS — I2583 Coronary atherosclerosis due to lipid rich plaque: Principal | ICD-10-CM

## 2014-09-25 MED ORDER — TICAGRELOR 90 MG PO TABS: 90.0000 mg | ORAL_TABLET | Freq: Two times a day (BID) | ORAL | Status: DC

## 2014-09-25 NOTE — Telephone Encounter (Signed)
Pt would like some samples of Brilinta please.

## 2014-09-25 NOTE — Telephone Encounter (Signed)
Medication samples have been provided to the patient.  Drug name: Brilinta 64 Qty: 32 tab LOT: [id number] Exp.Date: 12/2016 Drug name: Brilinta 40 Qty: 24 tab LOT: [id number] Exp.Date: 02/2017  Samples left at front desk for patient pick-up. Patient notified.  Chauncey Sciulli, [place] 3:39 PM 09/25/2014

## 2014-09-28 ENCOUNTER — Encounter: Payer: Self-pay | Admitting: Internal Medicine

## 2014-09-28 ENCOUNTER — Ambulatory Visit (INDEPENDENT_AMBULATORY_CARE_PROVIDER_SITE_OTHER): Payer: Medicare PPO | Admitting: Internal Medicine

## 2014-09-28 VITALS — BP 124/72 | HR 54 | Ht 69.0 in | Wt 184.1 lb

## 2014-09-28 DIAGNOSIS — I10 Essential (primary) hypertension: Secondary | ICD-10-CM

## 2014-09-28 DIAGNOSIS — I447 Left bundle-branch block, unspecified: Secondary | ICD-10-CM

## 2014-09-28 DIAGNOSIS — N183 Chronic kidney disease, stage 3 unspecified: Secondary | ICD-10-CM

## 2014-09-28 DIAGNOSIS — I739 Peripheral vascular disease, unspecified: Principal | ICD-10-CM

## 2014-09-28 DIAGNOSIS — I779 Disorder of arteries and arterioles, unspecified: Secondary | ICD-10-CM

## 2014-09-28 DIAGNOSIS — I2583 Coronary atherosclerosis due to lipid rich plaque: Secondary | ICD-10-CM

## 2014-09-28 DIAGNOSIS — I350 Nonrheumatic aortic (valve) stenosis: Secondary | ICD-10-CM

## 2014-09-28 DIAGNOSIS — I2510 Atherosclerotic heart disease of native coronary artery without angina pectoris: Secondary | ICD-10-CM

## 2014-09-28 NOTE — Progress Notes (Signed)
OFFICE NOTE  Chief Complaint:  No complaints  Primary Care Physician: [name], MD  HPI:  [name] is a 81 year old gentleman who had unfortunate aggressive restenosis of an RCA lesion in the past, recently restented. He presented with worsening anginal symptoms and medical therapy was recommended in November. He was scheduled for a nuclear stress test which he underwent on July 21, 2012, and this was negative for ischemia. Recently, he had been experiencing some soreness in his left chest wall which has been going on and off for about a month. He is not experiencing the shortness of breath that he previously experienced with his angina, especially when walking up hills. He noted that he had an episode of a fall when he was trying to lift a very heavy deer, landed on his back and perhaps strained or sprained intercostal muscles or something in the chest wall. Based on his symptoms he underwent stress testing which indicated ischemia and he underwent a repeat coronary intervention to the RCA.   At his last office visit he was doing fairly well, without worsening shortness of breath or chest discomfort.  [place] but noted a small increase in shortness of breath with activity. He's also had weight gain recently and some lower extremity swelling. He says his symptoms are somewhat reminiscent of the symptoms that required prior coronary intervention about one year ago, but not as significant. Of note, he does have moderate to severe aortic stenosis which were touching very closely and a history of diastolic dysfunction plus or minus heart failure.  [name] returns today and is feeling quite well. He denies any chest pain or shortness of breath. He told me of an episode recently where he helped deliver car and had to walk back home. Walked about 2 half miles during the middle of the day in the heat and had no problems or shortness of  breath or chest discomfort. He actually is feeling the best he has in some time.  [name] back in the office today. He continues to be active. He's been hunting recently and doing a lot of exertion without any worsening shortness of breath or chest pain. He recently had problems with some pain in his legs which he attributed to combination of Crestor and that he had. He discontinued his area and wean down his Crestor. He is currently getting 40 mg tablets and breaking them into thirds. He's taking one third of a 40 mg tablet are roughly 13 mg daily which she says is tolerable. His most recent lipid profile from September 2015 shows a total cholesterol 118, temperature 31, HDL 59 and LDL of 43 which is very low. He should be able to tolerate the decrease in his Crestor and still be at target numbers. He did have a lipoma profile and MR in March 2015. I believe there was an air in triglycerides which read over 700, but LDL-C was less than 100, LDL-P was 700 and generally showed a very favorable profile.  PMHx:  Past Medical History  Diagnosis Date  . Shortness of breath   . Chronic kidney disease   . Coronary artery disease   . Hypertension   . Stroke     hx of TIA  . Blood transfusion     "  no reaction to transfusion "  . Dysrhythmia     LLB  anf leaking valve per patient  .  Dyspnea on exertion, anginal equivilant 10/16/2010    2D Echo - EF >55%, mild-moderate mitral regurgiation, mild-moderate tricuspid regurgitation, moderate calcification of the aortic leaflets  . CAD (coronary artery disease), CABG 1996. 09/23/2011    2D Echo - EF >55%, moderate calcification of the aortic valve leaflets  . HTN (hypertension),uncontrolled 09/16/2011  . CKD (chronic kidney disease) stage 2, GFR 60-89 ml/min 09/16/2011  . Pulmonary hypertension, mild 09/17/2011  . Angina effort, 12/2011 with DOE anginal equivlent 12/22/2011  . Abnormal cardiovascular stress test, 11/2011 wint inferolateral reveribility 12/22/2011    . Recurrent angina status post rotational atherectomy, with PTCA for ISR to RCA, 12/22/2011 12/22/2011  . Carotid artery disease, Rt. CEA, 08/04/07, Lt. CEA 04/26/07. 12/23/2011  . Crescendo angina 06/09/2012  . CVA (cerebral vascular accident) 04/22/2007    2D Echo - EF 50-55%, aortic valve thickness mild-moderately increased,  . Peripheral arterial disease     Past Surgical History  Procedure Laterality Date  . Coronary artery bypass graft  08/04/2007  . Cardiac catheterization Left 06/10/2012    Ostial RCA 99% stenosis pre-dilation 3x52mm Angiosculpt PTCA balloon, 3.5x64mm Promus Element DES overlapping proximal stent and into the Aorto-ostium, post-dilated with4x60mm [place] Trek balloon - 99% stenosis reduced to 0%  . Coronary angioplasty    . Cea  2008  . Cardiac catheterization Left 12/22/2011    Left Main-diffuse tapering 50-60% stenosis; mild-moderate diseaseof the right ventricle branch of the right coronary artery; will plan FFR of ostial RCA  . Fractional flow reserve wire  12/22/2011    Ostial RCA, FFR ratio-0.82, physiologically significant, 3.25x64mm Flextome Cutting balloon deployed at ~3.55mm final estimated diameter  . Cardiac catheterization Bilateral 09/16/2011    Severe 90% ostial RCA disease which is calcified, will likely need PCI to ostial RCA with calcification  . Cardiac catheterization  09/18/2011    Calcified ostial 90-95% proximal RCA stenosis, 3x84mm Emerge balloon predilation, 3x30mm DES PROMUS Element stent was placed, post stent dilation done utilizing a 3.25x13mm noncompliant Quantum Apex balloon, 90-95% stenosis reduced to 0%  .  fluid knee Right Sept. 6, 2014    Fluid removed  . Left and right heart catheterization with coronary angiogram Left 09/16/2011    Procedure: LEFT AND RIGHT HEART CATHETERIZATION WITH CORONARY ANGIOGRAM;  Surgeon: Pixie Casino, MD;  Location: [facility] CATH LAB;  Service: Cardiovascular;  Laterality: Left;  . Percutaneous coronary stent intervention  (pci-s) N/A 09/18/2011    Procedure: PERCUTANEOUS CORONARY STENT INTERVENTION (PCI-S);  Surgeon: Troy Sine, MD;  Location: [facility] CATH LAB;  Service: Cardiovascular;  Laterality: N/A;  . Temporary pacemaker insertion  09/18/2011    Procedure: TEMPORARY PACEMAKER INSERTION;  Surgeon: Troy Sine, MD;  Location: [facility] CATH LAB;  Service: Cardiovascular;;  . Percutaneous coronary rotoblator intervention (pci-r)  09/18/2011    Procedure: PERCUTANEOUS CORONARY ROTOBLATOR INTERVENTION (PCI-R);  Surgeon: Troy Sine, MD;  Location: [facility] CATH LAB;  Service: Cardiovascular;;  . Left heart catheterization with coronary/graft angiogram N/A 12/22/2011    Procedure: LEFT HEART CATHETERIZATION WITH Beatrix Fetters;  Surgeon: Pixie Casino, MD;  Location: [facility] C[facility] LAB;  Service: Cardiovascular;  Laterality: N/A;  . Fractional flow reserve wire Right 12/22/2011    Procedure: FRACTIONAL FLOW RESERVE WIRE;  Surgeon: Pixie Casino, MD;  Location: [facility] CATH LAB;  Service: Cardiovascular;  Laterality: Right;  . Left heart catheterization with coronary angiogram N/A 06/10/2012    Procedure: LEFT HEART CATHETERIZATION WITH CORONARY ANGIOGRAM;  Surgeon: Pixie Casino, MD;  Location: [facility]  CATH LAB;  Service: Cardiovascular;  Laterality: N/A;    FAMHx:  Family History  Problem Relation Age of Onset  . Cancer Mother   . Heart disease Mother   . Stroke Mother   . Alcohol abuse Father   . Depression Father   . Alzheimer's disease Father   . Heart disease Brother     Heart bypass in 1996  . Cancer Brother     Prostate  . Cancer Sister     SOCHx:   reports that he has never smoked. He has never used smokeless tobacco. He reports that he does not drink alcohol or use illicit drugs.  ALLERGIES:  Allergies  Allergen Reactions  . Atorvastatin Other (See Comments)    Muscle and joint pain, can tolerate rosuvastatin    ROS: A comprehensive review of systems was negative except for: Musculoskeletal:  positive for myalgias  HOME MEDS: Current Outpatient Prescriptions  Medication Sig Dispense Refill  . amLODipine (NORVASC) 10 MG tablet Take 1 tablet (10 mg total) by mouth at bedtime. 30 tablet 6  . aspirin EC 81 MG tablet Take 81 mg by mouth daily.    . clotrimazole-betamethasone (LOTRISONE) cream 3 (three) times daily as needed.    . fish oil-omega-3 fatty acids 1000 MG capsule Take 1 g by mouth 3 (three) times daily.    . furosemide (LASIX) 40 MG tablet Take 40 mg by mouth daily as needed.     . isosorbide mononitrate (IMDUR) 60 MG 24 hr tablet Take 90 mg by mouth daily.    . metoprolol tartrate (LOPRESSOR) 25 MG tablet Take 25 mg by mouth 2 (two) times daily.    . niacin (NIASPAN) 500 MG CR tablet Take 500 mg by mouth at bedtime.    Marland Kitchen NITROSTAT 0.4 MG SL tablet Place 1 tablet under the tongue every 5 (five) minutes x 3 doses as needed.    . ondansetron (ZOFRAN-ODT) 4 MG disintegrating tablet as needed.    . rosuvastatin (CRESTOR) 40 MG tablet Take 1/3 tablet by mouth daily.    . ticagrelor (BRILINTA) 90 MG TABS tablet Take 1 tablet (90 mg total) by mouth 2 (two) times daily. 56 tablet 0   No current facility-administered medications for this visit.   Facility-Administered Medications Ordered in Other Visits  Medication Dose Route Frequency Provider Last Rate Last Dose  . sodium chloride 0.9 % injection 3 mL  3 mL Intravenous PRN Pixie Casino, MD        LABS/IMAGING: No results found for this or any previous visit (from the past 48 hour(s)). No results found.  VITALS: BP 124/72 mmHg  Pulse 54  Ht 5\' 9"  (1.753 m)  Wt 184 lb 1.6 oz (83.507 kg)  BMI 27.17 kg/m2  EXAM: GEN: Awake, in no distress HEENT: PERRLA, EOMI Lungs: Clear bilaterally Cardiovascular: Regular rate and rhythm, normal S1, S2, 3/6 mid to late peaking systolic murmur at the right upper shoulder border Abdomen: Soft, nontender Extremities: No edema Neurologic: Grossly nonfocal Psychiatric: Normal mood  and affect   EKG: sinus bradycardia at 54 with a left bundle branch block  ASSESSMENT: 1. Coronary artery disease with multiple recent PCI to the right coronary 2. History of bilateral carotid endarterectomies 3. Hypertension 4. Dyslipidemia 5. Left bundle branch block 6. Stage III chronic kidney disease 7. Moderate aortic stenosis 8. DOE - negative nuclear stress test 09/02/2013  PLAN: 1.   [name] is asymptomatic at this time. He continues to exercise without any  limitations. He had a stress test about a year ago which was negative. His cholesterol has been very well controlled although recently he discontinued Zetia and reduced his dose of Crestor due to some myalgias which he says is now gone away. He denies any worsening shortness of breath. Overall he is doing fairly well and will plan to see him back in 6 months. He probably will need repeat echocardiography within the next year.  Pixie Casino, MD, [facility] Attending Cardiologist [facility]  Kindall Swaby C 09/28/2014, 10:20 AM

## 2014-09-28 NOTE — Patient Instructions (Addendum)
Your physician wants you to follow-up in: 6 months with Dr. Debara Pickett. You will receive a reminder letter in the mail two months in advance. If you don't receive a letter, please call our office to schedule the follow-up appointment.  Medication samples have been provided to the patient.  Drug name: Brilinta  Qty: 32  LOT: [id number]  Exp.Date: 02/2017

## 2014-10-03 ENCOUNTER — Encounter: Payer: Self-pay | Admitting: Internal Medicine

## 2014-10-30 ENCOUNTER — Telehealth: Payer: Self-pay | Admitting: Internal Medicine

## 2014-10-30 DIAGNOSIS — I2510 Atherosclerotic heart disease of native coronary artery without angina pectoris: Secondary | ICD-10-CM

## 2014-10-30 DIAGNOSIS — I2583 Coronary atherosclerosis due to lipid rich plaque: Principal | ICD-10-CM

## 2014-10-30 MED ORDER — TICAGRELOR 90 MG PO TABS: 90.0000 mg | ORAL_TABLET | Freq: Two times a day (BID) | ORAL | Status: DC

## 2014-10-30 NOTE — Telephone Encounter (Signed)
Patient aware samples are at the front desk for pick up  

## 2014-10-30 NOTE — Telephone Encounter (Signed)
Pt would like samples of Brilinta please.

## 2014-11-14 ENCOUNTER — Encounter: Payer: Self-pay | Admitting: Internal Medicine

## 2014-11-30 ENCOUNTER — Telehealth: Payer: Self-pay | Admitting: Internal Medicine

## 2014-11-30 DIAGNOSIS — I2510 Atherosclerotic heart disease of native coronary artery without angina pectoris: Secondary | ICD-10-CM

## 2014-11-30 DIAGNOSIS — I2583 Coronary atherosclerosis due to lipid rich plaque: Principal | ICD-10-CM

## 2014-11-30 MED ORDER — TICAGRELOR 90 MG PO TABS: 90.0000 mg | ORAL_TABLET | Freq: Two times a day (BID) | ORAL | Status: DC

## 2014-11-30 NOTE — Telephone Encounter (Signed)
Pt need some samples of Brilinta please.

## 2014-11-30 NOTE — Telephone Encounter (Signed)
SAMPLES AVAILABLE TO PICK UP, PATIENT AWARE

## 2015-01-02 ENCOUNTER — Other Ambulatory Visit: Payer: Self-pay | Admitting: Cardiology

## 2015-01-02 ENCOUNTER — Ambulatory Visit (INDEPENDENT_AMBULATORY_CARE_PROVIDER_SITE_OTHER): Payer: Medicare PPO | Admitting: Ophthalmology

## 2015-01-02 DIAGNOSIS — I10 Essential (primary) hypertension: Secondary | ICD-10-CM

## 2015-01-02 DIAGNOSIS — H43813 Vitreous degeneration, bilateral: Secondary | ICD-10-CM

## 2015-01-02 DIAGNOSIS — H35033 Hypertensive retinopathy, bilateral: Secondary | ICD-10-CM | POA: Diagnosis not present

## 2015-01-02 DIAGNOSIS — H34831 Tributary (branch) retinal vein occlusion, right eye: Secondary | ICD-10-CM

## 2015-01-02 NOTE — Telephone Encounter (Signed)
Spoke with pt, aware no samples available at this time. 

## 2015-01-02 NOTE — Telephone Encounter (Signed)
Pt called in wanting some samples of Brilinta. Please call if we have any available  Thanks

## 2015-01-16 ENCOUNTER — Telehealth: Payer: Self-pay | Admitting: Internal Medicine

## 2015-01-16 ENCOUNTER — Other Ambulatory Visit: Payer: Self-pay | Admitting: *Deleted

## 2015-01-16 NOTE — Telephone Encounter (Signed)
[name] is calling to get samples of Brilinta . Please call

## 2015-01-16 NOTE — Telephone Encounter (Signed)
Samples left out front for pick up , pt. called

## 2015-02-06 ENCOUNTER — Other Ambulatory Visit: Payer: Self-pay | Admitting: Cardiology

## 2015-02-06 ENCOUNTER — Telehealth: Payer: Self-pay | Admitting: Internal Medicine

## 2015-02-06 NOTE — Telephone Encounter (Signed)
Medication samples have been provided to the patient.  Drug name: brilinta 90mg   Qty: 48  LOT: [id number]  Exp.Date: 06/2017  Samples left at front desk for patient pick-up. Patient notified.  Sheral Apley M 9:15 AM 02/06/2015

## 2015-02-06 NOTE — Telephone Encounter (Signed)
Rx(s) sent to pharmacy electronically.  

## 2015-02-06 NOTE — Telephone Encounter (Signed)
[name] asking for some samples of Brilinta . Please call   Thanks

## 2015-02-08 ENCOUNTER — Encounter: Payer: Self-pay | Admitting: Internal Medicine

## 2015-02-08 ENCOUNTER — Telehealth: Payer: Self-pay | Admitting: Internal Medicine

## 2015-02-13 NOTE — Telephone Encounter (Signed)
Close encounters

## 2015-03-15 ENCOUNTER — Ambulatory Visit (INDEPENDENT_AMBULATORY_CARE_PROVIDER_SITE_OTHER): Payer: Medicare PPO | Admitting: Internal Medicine

## 2015-03-15 ENCOUNTER — Encounter: Payer: Self-pay | Admitting: Internal Medicine

## 2015-03-15 VITALS — BP 142/76 | HR 62 | Ht 69.5 in | Wt 184.7 lb

## 2015-03-15 DIAGNOSIS — I350 Nonrheumatic aortic (valve) stenosis: Secondary | ICD-10-CM

## 2015-03-15 DIAGNOSIS — I10 Essential (primary) hypertension: Secondary | ICD-10-CM | POA: Diagnosis not present

## 2015-03-15 DIAGNOSIS — I2510 Atherosclerotic heart disease of native coronary artery without angina pectoris: Secondary | ICD-10-CM | POA: Diagnosis not present

## 2015-03-15 DIAGNOSIS — I447 Left bundle-branch block, unspecified: Secondary | ICD-10-CM

## 2015-03-15 DIAGNOSIS — I739 Peripheral vascular disease, unspecified: Secondary | ICD-10-CM

## 2015-03-15 DIAGNOSIS — I779 Disorder of arteries and arterioles, unspecified: Secondary | ICD-10-CM

## 2015-03-15 DIAGNOSIS — I2583 Coronary atherosclerosis due to lipid rich plaque: Secondary | ICD-10-CM

## 2015-03-15 NOTE — Progress Notes (Signed)
OFFICE NOTE  Chief Complaint:  No complaints  Primary Care Physician: [name], MD  HPI:  [name] is a 81 year old gentleman who had unfortunate aggressive restenosis of an RCA lesion in the past, recently restented. He presented with worsening anginal symptoms and medical therapy was recommended in November. He was scheduled for a nuclear stress test which he underwent on July 21, 2012, and this was negative for ischemia. Recently, he had been experiencing some soreness in his left chest wall which has been going on and off for about a month. He is not experiencing the shortness of breath that he previously experienced with his angina, especially when walking up hills. He noted that he had an episode of a fall when he was trying to lift a very heavy deer, landed on his back and perhaps strained or sprained intercostal muscles or something in the chest wall. Based on his symptoms he underwent stress testing which indicated ischemia and he underwent a repeat coronary intervention to the RCA.   At his last office visit he was doing fairly well, without worsening shortness of breath or chest discomfort.  [place] but noted a small increase in shortness of breath with activity. He's also had weight gain recently and some lower extremity swelling. He says his symptoms are somewhat reminiscent of the symptoms that required prior coronary intervention about one year ago, but not as significant. Of note, he does have moderate to severe aortic stenosis which were touching very closely and a history of diastolic dysfunction plus or minus heart failure.  [name] returns today and is feeling quite well. He denies any chest pain or shortness of breath. He told me of an episode recently where he helped deliver car and had to walk back home. Walked about 2 half miles during the middle of the day in the heat and had no problems or shortness of  breath or chest discomfort. He actually is feeling the best he has in some time.  [name] back in the office today. He continues to be active. He's been hunting recently and doing a lot of exertion without any worsening shortness of breath or chest pain. He recently had problems with some pain in his legs which he attributed to combination of Crestor and that he had. He discontinued his area and wean down his Crestor. He is currently getting 40 mg tablets and breaking them into thirds. He's taking one third of a 40 mg tablet are roughly 13 mg daily which she says is tolerable. His most recent lipid profile from September 2015 shows a total cholesterol 118, temperature 31, HDL 59 and LDL of 43 which is very low. He should be able to tolerate the decrease in his Crestor and still be at target numbers. He did have a lipoma profile and MR in March 2015. I believe there was an air in triglycerides which read over 700, but LDL-C was less than 100, LDL-P was 700 and generally showed a very favorable profile.  [name] returns today in the office for follow-up. Overall he reports doing fairly well. He continues to be active during the summer denies any chest pain or worsening shortness of breath. He has follow-up of his carotid endarterectomies in November. We are following him for aortic stenosis and he does not seem to be symptomatic with that.  PMHx:  Past Medical History  Diagnosis Date  . Shortness  of breath   . Chronic kidney disease   . Coronary artery disease   . Hypertension   . Stroke     hx of TIA  . Blood transfusion     "  no reaction to transfusion "  . Dysrhythmia     LLB  anf leaking valve per patient  . Dyspnea on exertion, anginal equivilant 10/16/2010    2D Echo - EF >55%, mild-moderate mitral regurgiation, mild-moderate tricuspid regurgitation, moderate calcification of the aortic leaflets  . CAD (coronary artery disease), CABG 1996. 09/23/2011    2D Echo - EF >55%, moderate  calcification of the aortic valve leaflets  . HTN (hypertension),uncontrolled 09/16/2011  . CKD (chronic kidney disease) stage 2, GFR 60-89 ml/min 09/16/2011  . Pulmonary hypertension, mild 09/17/2011  . Angina effort, 12/2011 with DOE anginal equivlent 12/22/2011  . Abnormal cardiovascular stress test, 11/2011 wint inferolateral reveribility 12/22/2011  . Recurrent angina status post rotational atherectomy, with PTCA for ISR to RCA, 12/22/2011 12/22/2011  . Carotid artery disease, Rt. CEA, 08/04/07, Lt. CEA 04/26/07. 12/23/2011  . Crescendo angina 06/09/2012  . CVA (cerebral vascular accident) 04/22/2007    2D Echo - EF 50-55%, aortic valve thickness mild-moderately increased,  . Peripheral arterial disease     Past Surgical History  Procedure Laterality Date  . Coronary artery bypass graft  08/04/2007  . Cardiac catheterization Left 06/10/2012    Ostial RCA 99% stenosis pre-dilation 3x80mm Angiosculpt PTCA balloon, 3.5x32mm Promus Element DES overlapping proximal stent and into the Aorto-ostium, post-dilated with4x33mm [place] Trek balloon - 99% stenosis reduced to 0%  . Coronary angioplasty    . Cea  2008  . Cardiac catheterization Left 12/22/2011    Left Main-diffuse tapering 50-60% stenosis; mild-moderate diseaseof the right ventricle branch of the right coronary artery; will plan FFR of ostial RCA  . Fractional flow reserve wire  12/22/2011    Ostial RCA, FFR ratio-0.82, physiologically significant, 3.25x56mm Flextome Cutting balloon deployed at ~3.21mm final estimated diameter  . Cardiac catheterization Bilateral 09/16/2011    Severe 90% ostial RCA disease which is calcified, will likely need PCI to ostial RCA with calcification  . Cardiac catheterization  09/18/2011    Calcified ostial 90-95% proximal RCA stenosis, 3x32mm Emerge balloon predilation, 3x30mm DES PROMUS Element stent was placed, post stent dilation done utilizing a 3.25x37mm noncompliant Quantum Apex balloon, 90-95% stenosis reduced to 0%  .   fluid knee Right Sept. 6, 2014    Fluid removed  . Left and right heart catheterization with coronary angiogram Left 09/16/2011    Procedure: LEFT AND RIGHT HEART CATHETERIZATION WITH CORONARY ANGIOGRAM;  Surgeon: Pixie Casino, MD;  Location: [facility] CATH LAB;  Service: Cardiovascular;  Laterality: Left;  . Percutaneous coronary stent intervention (pci-s) N/A 09/18/2011    Procedure: PERCUTANEOUS CORONARY STENT INTERVENTION (PCI-S);  Surgeon: Troy Sine, MD;  Location: [facility] CATH LAB;  Service: Cardiovascular;  Laterality: N/A;  . Temporary pacemaker insertion  09/18/2011    Procedure: TEMPORARY PACEMAKER INSERTION;  Surgeon: Troy Sine, MD;  Location: [facility] CATH LAB;  Service: Cardiovascular;;  . Percutaneous coronary rotoblator intervention (pci-r)  09/18/2011    Procedure: PERCUTANEOUS CORONARY ROTOBLATOR INTERVENTION (PCI-R);  Surgeon: Troy Sine, MD;  Location[facility] CATH LAB;  Service: Cardiovascular;;  . Left heart catheterization with coronary/graft angiogram N/A 12/22/2011    Procedure: LEFT HEART CATHETERIZATION WITH Beatrix Fetters;  Surgeon: Pixie Casino, MD;  Location: [facility] CATH LAB;  Service: Cardiovascular;  Laterality: N/A;  . Fractional  flow reserve wire Right 12/22/2011    Procedure: FRACTIONAL FLOW RESERVE WIRE;  Surgeon: Pixie Casino, MD;  Location: [facility] CATH LAB;  Service: Cardiovascular;  Laterality: Right;  . Left heart catheterization with coronary angiogram N/A 06/10/2012    Procedure: LEFT HEART CATHETERIZATION WITH CORONARY ANGIOGRAM;  Surgeon: Pixie Casino, MD;  Location: [facility] CATH LAB;  Service: Cardiovascular;  Laterality: N/A;    FAMHx:  Family History  Problem Relation Age of Onset  . Cancer Mother   . Heart disease Mother   . Stroke Mother   . Alcohol abuse Father   . Depression Father   . Alzheimer's disease Father   . Heart disease Brother     Heart bypass in 1996  . Cancer Brother     Prostate  . Cancer Sister     SOCHx:   reports that  he has never smoked. He has never used smokeless tobacco. He reports that he does not drink alcohol or use illicit drugs.  ALLERGIES:  Allergies  Allergen Reactions  . Atorvastatin Other (See Comments)    Muscle and joint pain, can tolerate rosuvastatin    ROS: A comprehensive review of systems was negative.  HOME MEDS: Current Outpatient Prescriptions  Medication Sig Dispense Refill  . amLODipine (NORVASC) 10 MG tablet Take 1 tablet (10 mg total) by mouth at bedtime. 30 tablet 6  . aspirin EC 81 MG tablet Take 81 mg by mouth daily.    Marland Kitchen BRILINTA 90 MG TABS tablet TAKE ONE (1) TABLET BY MOUTH TWO (2) TIMES DAILY 60 tablet 6  . clotrimazole-betamethasone (LOTRISONE) cream 3 (three) times daily as needed.    . fish oil-omega-3 fatty acids 1000 MG capsule Take 1 g by mouth 3 (three) times daily.    . isosorbide mononitrate (IMDUR) 60 MG 24 hr tablet Take 90 mg by mouth daily.    Marland Kitchen ketoconazole (NIZORAL) 2 % cream Apply 1 application topically as needed.    . metoprolol tartrate (LOPRESSOR) 25 MG tablet Take 25 mg by mouth 2 (two) times daily.    . niacin (NIASPAN) 500 MG CR tablet Take 500 mg by mouth at bedtime.    Marland Kitchen NITROSTAT 0.4 MG SL tablet Place 1 tablet under the tongue every 5 (five) minutes x 3 doses as needed.    . rosuvastatin (CRESTOR) 40 MG tablet Take 1/3 tablet by mouth daily.     No current facility-administered medications for this visit.   Facility-Administered Medications Ordered in Other Visits  Medication Dose Route Frequency Provider Last Rate Last Dose  . sodium chloride 0.9 % injection 3 mL  3 mL Intravenous PRN Pixie Casino, MD        LABS/IMAGING: No results found for this or any previous visit (from the past 48 hour(s)). No results found.  VITALS: BP 142/76 mmHg  Pulse 62  Ht 5' 9.5" (1.765 m)  Wt 184 lb 11.2 oz (83.779 kg)  BMI 26.89 kg/m2  EXAM: GEN: Awake, in no distress HEENT: PERRLA, EOMI Lungs: Clear bilaterally Cardiovascular: Regular  rate and rhythm, normal S1, S2, 3/6 mid to late peaking systolic murmur at the right upper shoulder border Abdomen: Soft, nontender Extremities: No edema Neurologic: Grossly nonfocal Psychiatric: Normal mood and affect   EKG: sinus bradycardia at 51 with a left bundle branch block  ASSESSMENT: 1. Coronary artery disease with multiple recent PCI to the right coronary 2. History of bilateral carotid endarterectomies 3. Hypertension 4. Dyslipidemia 5. Left bundle branch block 6.  Stage III chronic kidney disease 7. Moderate aortic stenosis 8. DOE - negative nuclear stress test 09/02/2013  PLAN: 1.   [name] is asymptomatic at this time. He continues to exercise without any limitations. He had a stress test about a year ago which was negative. His cholesterol has been very well controlled although recently he discontinued Zetia and reduced his dose of Crestor due to some myalgias which he says is now gone away. He denies any worsening shortness of breath. He is due for repeat echocardiogram for ongoing surveillance of his aortic stenosis. We'll go ahead and obtain that today and contact him with those results. Plan to see him back in 6 months or sooner as necessary.   Pixie Casino, MD, [facility] Attending Cardiologist [place] [name] 03/15/2015, 10:31 AM

## 2015-03-15 NOTE — Patient Instructions (Addendum)
Your physician wants you to follow-up in: 6 months with Dr. Debara Pickett. You will receive a reminder letter in the mail two months in advance. If you don't receive a letter, please call our office to schedule the follow-up appointment.  Your physician has requested that you have an echocardiogram @ [address]. Echocardiography is a painless test that uses sound waves to create images of your heart. It provides your doctor with information about the size and shape of your heart and how well your heart's chambers and valves are working. This procedure takes approximately one hour. There are no restrictions for this procedure.  Samples of Brilinta - 5 bottles give to patient

## 2015-03-23 ENCOUNTER — Ambulatory Visit (HOSPITAL_COMMUNITY): Payer: Medicare PPO | Attending: Cardiovascular Disease

## 2015-03-23 ENCOUNTER — Other Ambulatory Visit: Payer: Self-pay

## 2015-03-23 DIAGNOSIS — I350 Nonrheumatic aortic (valve) stenosis: Secondary | ICD-10-CM | POA: Diagnosis not present

## 2015-03-23 DIAGNOSIS — I340 Nonrheumatic mitral (valve) insufficiency: Secondary | ICD-10-CM | POA: Insufficient documentation

## 2015-03-23 DIAGNOSIS — I352 Nonrheumatic aortic (valve) stenosis with insufficiency: Secondary | ICD-10-CM | POA: Diagnosis not present

## 2015-03-23 DIAGNOSIS — I10 Essential (primary) hypertension: Secondary | ICD-10-CM | POA: Diagnosis not present

## 2015-03-23 DIAGNOSIS — I071 Rheumatic tricuspid insufficiency: Secondary | ICD-10-CM | POA: Insufficient documentation

## 2015-03-23 DIAGNOSIS — I517 Cardiomegaly: Secondary | ICD-10-CM | POA: Insufficient documentation

## 2015-04-04 ENCOUNTER — Ambulatory Visit: Payer: Medicare PPO | Admitting: Internal Medicine

## 2015-04-10 ENCOUNTER — Ambulatory Visit: Payer: Medicare PPO | Admitting: Internal Medicine

## 2015-05-03 DIAGNOSIS — B86 Scabies: Secondary | ICD-10-CM | POA: Diagnosis not present

## 2015-05-11 DIAGNOSIS — E789 Disorder of lipoprotein metabolism, unspecified: Secondary | ICD-10-CM | POA: Diagnosis not present

## 2015-05-11 DIAGNOSIS — I10 Essential (primary) hypertension: Secondary | ICD-10-CM | POA: Diagnosis not present

## 2015-05-11 DIAGNOSIS — Z125 Encounter for screening for malignant neoplasm of prostate: Secondary | ICD-10-CM | POA: Diagnosis not present

## 2015-05-14 ENCOUNTER — Telehealth: Payer: Self-pay | Admitting: Internal Medicine

## 2015-05-14 NOTE — Telephone Encounter (Signed)
Patient notified we do not have Brilinta samples at this time.  Suggested he call next week.  Patient voiced understanding.

## 2015-05-14 NOTE — Telephone Encounter (Signed)
Patient calling the office for samples of medication:   1.  What medication and dosage are you requesting samples for? Brilinta   2.  Are you currently out of this medication? Yes   3. Are you requesting samples to get you through until a mail order prescription arrives? No

## 2015-05-17 DIAGNOSIS — E789 Disorder of lipoprotein metabolism, unspecified: Secondary | ICD-10-CM | POA: Diagnosis not present

## 2015-05-17 DIAGNOSIS — R079 Chest pain, unspecified: Secondary | ICD-10-CM | POA: Diagnosis not present

## 2015-05-17 DIAGNOSIS — I2510 Atherosclerotic heart disease of native coronary artery without angina pectoris: Secondary | ICD-10-CM | POA: Diagnosis not present

## 2015-05-17 DIAGNOSIS — Z23 Encounter for immunization: Secondary | ICD-10-CM | POA: Diagnosis not present

## 2015-05-21 ENCOUNTER — Other Ambulatory Visit: Payer: Self-pay | Admitting: Internal Medicine

## 2015-05-21 NOTE — Telephone Encounter (Signed)
Patient calling the office for samples of medication:   1.  What medication and dosage are you requesting samples for? Brilinta  2.  Are you currently out of this medication? Yes   3. Are you requesting samples to get you through until a mail order prescription arrives?  no

## 2015-05-21 NOTE — Telephone Encounter (Signed)
Medication samples have been provided to the patient.  Drug name: brilinta 90mg   Qty: 48  LOT: [id number]  Exp.Date: 08/2017  Samples left at front desk for patient pick-up. Patient notified.  Sheral Apley M 1:48 PM 05/21/2015

## 2015-05-25 ENCOUNTER — Ambulatory Visit: Payer: Medicare PPO | Admitting: Internal Medicine

## 2015-06-22 ENCOUNTER — Encounter: Payer: Self-pay | Admitting: Family

## 2015-06-25 ENCOUNTER — Other Ambulatory Visit (HOSPITAL_COMMUNITY): Payer: Medicare PPO

## 2015-06-25 ENCOUNTER — Ambulatory Visit (INDEPENDENT_AMBULATORY_CARE_PROVIDER_SITE_OTHER): Payer: Medicare PPO | Admitting: Family

## 2015-06-25 ENCOUNTER — Encounter: Payer: Self-pay | Admitting: Family

## 2015-06-25 ENCOUNTER — Ambulatory Visit (HOSPITAL_COMMUNITY)
Admission: RE | Admit: 2015-06-25 | Discharge: 2015-06-25 | Disposition: A | Discharge status: Home / Self Care | Payer: Medicare PPO | Source: Ambulatory Visit | Attending: Family | Admitting: Family

## 2015-06-25 ENCOUNTER — Ambulatory Visit: Payer: Medicare PPO | Admitting: Family

## 2015-06-25 ENCOUNTER — Other Ambulatory Visit: Payer: Self-pay | Admitting: Family

## 2015-06-25 VITALS — BP 135/69 | HR 60 | Temp 97.3°F | Resp 18 | Ht 69.5 in | Wt 189.0 lb

## 2015-06-25 DIAGNOSIS — I6523 Occlusion and stenosis of bilateral carotid arteries: Secondary | ICD-10-CM | POA: Diagnosis not present

## 2015-06-25 DIAGNOSIS — Z9889 Other specified postprocedural states: Secondary | ICD-10-CM

## 2015-06-25 DIAGNOSIS — I129 Hypertensive chronic kidney disease with stage 1 through stage 4 chronic kidney disease, or unspecified chronic kidney disease: Secondary | ICD-10-CM | POA: Insufficient documentation

## 2015-06-25 DIAGNOSIS — Z48812 Encounter for surgical aftercare following surgery on the circulatory system: Secondary | ICD-10-CM

## 2015-06-25 DIAGNOSIS — N182 Chronic kidney disease, stage 2 (mild): Secondary | ICD-10-CM | POA: Diagnosis not present

## 2015-06-25 NOTE — Addendum Note (Signed)
Addended by: Dorthula Rue L on: 06/25/2015 05:03 PM   Modules accepted: Orders

## 2015-06-25 NOTE — Patient Instructions (Signed)
Stroke Prevention Some medical conditions and behaviors are associated with an increased chance of having a stroke. You may prevent a stroke by making healthy choices and managing medical conditions. HOW CAN I REDUCE MY RISK OF HAVING A STROKE?   Stay physically active. Get at least 30 minutes of activity on most or all days.  Do not smoke. It may also be helpful to avoid exposure to secondhand smoke.  Limit alcohol use. Moderate alcohol use is considered to be:  No more than 2 drinks per day for men.  No more than 1 drink per day for nonpregnant women.  Eat healthy foods. This involves:  Eating 5 or more servings of fruits and vegetables a day.  Making dietary changes that address high blood pressure (hypertension), high cholesterol, diabetes, or obesity.  Manage your cholesterol levels.  Making food choices that are high in fiber and low in saturated fat, trans fat, and cholesterol may control cholesterol levels.  Take any prescribed medicines to control cholesterol as directed by your health care provider.  Manage your diabetes.  Controlling your carbohydrate and sugar intake is recommended to manage diabetes.  Take any prescribed medicines to control diabetes as directed by your health care provider.  Control your hypertension.  Making food choices that are low in salt (sodium), saturated fat, trans fat, and cholesterol is recommended to manage hypertension.  Ask your health care provider if you need treatment to lower your blood pressure. Take any prescribed medicines to control hypertension as directed by your health care provider.  If you are 18-39 years of age, have your blood pressure checked every 3-5 years. If you are 40 years of age or older, have your blood pressure checked every year.  Maintain a healthy weight.  Reducing calorie intake and making food choices that are low in sodium, saturated fat, trans fat, and cholesterol are recommended to manage  weight.  Stop drug abuse.  Avoid taking birth control pills.  Talk to your health care provider about the risks of taking birth control pills if you are over 35 years old, smoke, get migraines, or have ever had a blood clot.  Get evaluated for sleep disorders (sleep apnea).  Talk to your health care provider about getting a sleep evaluation if you snore a lot or have excessive sleepiness.  Take medicines only as directed by your health care provider.  For some people, aspirin or blood thinners (anticoagulants) are helpful in reducing the risk of forming abnormal blood clots that can lead to stroke. If you have the irregular heart rhythm of atrial fibrillation, you should be on a blood thinner unless there is a good reason you cannot take them.  Understand all your medicine instructions.  Make sure that other conditions (such as anemia or atherosclerosis) are addressed. SEEK IMMEDIATE MEDICAL CARE IF:   You have sudden weakness or numbness of the face, arm, or leg, especially on one side of the body.  Your face or eyelid droops to one side.  You have sudden confusion.  You have trouble speaking (aphasia) or understanding.  You have sudden trouble seeing in one or both eyes.  You have sudden trouble walking.  You have dizziness.  You have a loss of balance or coordination.  You have a sudden, severe headache with no known cause.  You have new chest pain or an irregular heartbeat. Any of these symptoms may represent a serious problem that is an emergency. Do not wait to see if the symptoms will   go away. Get medical help at once. Call your local emergency services (911 in [place] Do not drive yourself to the hospital.   This information is not intended to replace advice given to you by your health care provider. Make sure you discuss any questions you have with your health care provider.   Document Released: 09/11/2004 Document Revised: 08/25/2014 Document Reviewed:  02/04/2013 Elsevier Interactive Patient Education 2016 Elsevier Inc.  

## 2015-06-25 NOTE — Progress Notes (Signed)
Established Carotid Patient   History of Present Illness  [name] is a 81 y.o. male patient of Dr. Trula Slade who is status post staged bilateral CEAs in September and December 2008.  He returns today for follow up. He has 2 cardiac stents, denies any history of an MI. His cardiologist is Dr. Debara Pickett.  He reports a TIA before Sept. 2008 as manifested by right arm weakness which has resolved, denies strokes or TIA symptoms since he had the CEA's. The patient denies any history of amaurosis fugax or monocular blindness or receptive or expressive aphasia.   He is quite physically active, denies claudication symptoms with walking.  He states he will be seeing his cardiologist re some dyspnea; he also reports a known heart murmur.  Pt Diabetic: No Pt smoker: non-smoker  Pt meds include: Statin : Yes Betablocker: Yes ASA: Yes Other anticoagulants/antiplatelets: Brilinta since last cardiac stent placed.    Past Medical History  Diagnosis Date  . Shortness of breath   . Chronic kidney disease   . Coronary artery disease   . Hypertension   . Stroke ([place])     hx of TIA  . Blood transfusion     "  no reaction to transfusion "  . Dysrhythmia     LLB  anf leaking valve per patient  . Dyspnea on exertion, anginal equivilant 10/16/2010    2D Echo - EF >55%, mild-moderate mitral regurgiation, mild-moderate tricuspid regurgitation, moderate calcification of the aortic leaflets  . CAD (coronary artery disease), CABG 1996. 09/23/2011    2D Echo - EF >55%, moderate calcification of the aortic valve leaflets  . HTN (hypertension),uncontrolled 09/16/2011  . CKD (chronic kidney disease) stage 2, GFR 60-89 ml/min 09/16/2011  . Pulmonary hypertension, mild 09/17/2011  . Angina effort, 12/2011 with DOE anginal equivlent 12/22/2011  . Abnormal cardiovascular stress test, 11/2011 wint inferolateral reveribility 12/22/2011  . Recurrent angina status post rotational atherectomy, with PTCA for ISR to RCA,  12/22/2011 12/22/2011  . Carotid artery disease, Rt. CEA, 08/04/07, Lt. CEA 04/26/07. 12/23/2011  . 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 CVA (cerebral vascular accident) ([place]) 04/22/2007    2D Echo - EF 50-55%, aortic valve thickness mild-moderately increased,  . Peripheral arterial disease [facility])     Social History Social History  Substance Use Topics  . Smoking status: Never Smoker   . Smokeless tobacco: Never Used     Comment: rarely smoked when he did smoke   . Alcohol Use: No    Family History Family History  Problem Relation Age of Onset  . Cancer Mother   . Heart disease Mother   . Stroke Mother   . Alcohol abuse Father   . Depression Father   . Alzheimer's disease Father   . Heart disease Brother     Heart bypass in 1996  . Cancer Brother     Prostate  . Cancer Sister     Surgical History Past Surgical History  Procedure Laterality Date  . Coronary artery bypass graft  08/04/2007  . Cardiac catheterization Left 06/10/2012    Ostial RCA 99% stenosis pre-dilation 3x53mm Angiosculpt PTCA balloon, 3.5x32mm Promus Element DES overlapping proximal stent and into the Aorto-ostium, post-dilated with4x37mm Coleta Trek balloon - 99% stenosis reduced to 0%  . Coronary angioplasty    . Cea  2008  . Cardiac catheterization Left 12/22/2011    Left Main-diffuse tapering 50-60% stenosis; mild-moderate diseaseof the right ventricle branch of the right coronary artery; will plan FFR  of ostial RCA  . Fractional flow reserve wire  12/22/2011    Ostial RCA, FFR ratio-0.82, physiologically significant, 3.25x40mm Flextome Cutting balloon deployed at ~3.25mm final estimated diameter  . Cardiac catheterization Bilateral 09/16/2011    Severe 90% ostial RCA disease which is calcified, will likely need PCI to ostial RCA with calcification  . Cardiac catheterization  09/18/2011    Calcified ostial 90-95% proximal RCA stenosis, 3x71mm Emerge balloon predilation, 3x40mm DES PROMUS Element stent was placed, post  stent dilation done utilizing a 3.25x32mm noncompliant Quantum Apex balloon, 90-95% stenosis reduced to 0%  .  fluid knee Right Sept. 6, 2014    Fluid removed  . Left and right heart catheterization with coronary angiogram Left 09/16/2011    Procedure: LEFT AND RIGHT HEART CATHETERIZATION WITH CORONARY ANGIOGRAM;  Surgeon: Pixie Casino, MD;  [facility] C[facility] LAB;  Service: Cardiovascular;  Laterality: Left;  . Percutaneous coronary stent intervention (pci-s) N/A 09/18/2011    Procedure: PERCUTANEOUS CORONARY STENT INTERVENTION (PCI-S);  Surgeon: Troy Sine, MD;  Location: [facility] CATH LAB;  Service: Cardiovascular;  Laterality: N/A;  . Temporary pacemaker insertion  09/18/2011    Procedure: TEMPORARY PACEMAKER INSERTION;  Surgeon: Troy Sine, MD;  Location: [facility] CATH LAB;  Service: Cardiovascular;;  . Percutaneous coronary rotoblator intervention (pci-r)  09/18/2011    Procedure: PERCUTANEOUS CORONARY ROTOBLATOR INTERVENTION (PCI-R);  Surgeon: Troy Sine, MD;  Location: [facility] CATH LAB;  Service: Cardiovascular;;  . Left heart catheterization with coronary/graft angiogram N/A 12/22/2011    Procedure: LEFT HEART CATHETERIZATION WITH Beatrix Fetters;  Surgeon: Pixie Casino, MD;  Location: [facility] CATH LAB;  Service: Cardiovascular;  Laterality: N/A;  . Fractional flow reserve wire Right 12/22/2011    Procedure: FRACTIONAL FLOW RESERVE WIRE;  Surgeon: Pixie Casino, MD;  Location: [facility] CATH LAB;  Service: Cardiovascular;  Laterality: Right;  . Left heart catheterization with coronary angiogram N/A 06/10/2012    Procedure: LEFT HEART CATHETERIZATION WITH CORONARY ANGIOGRAM;  Surgeon: Pixie Casino, MD;  Location: [facility] CATH LAB;  Service: Cardiovascular;  Laterality: N/A;    Allergies  Allergen Reactions  . Atorvastatin Other (See Comments)    Muscle and joint pain, can tolerate rosuvastatin    Current Outpatient Prescriptions  Medication Sig Dispense Refill  . amLODipine (NORVASC) 10  MG tablet Take 1 tablet (10 mg total) by mouth at bedtime. 30 tablet 6  . aspirin EC 81 MG tablet Take 81 mg by mouth daily.    Marland Kitchen BRILINTA 90 MG TABS tablet TAKE ONE (1) TABLET BY MOUTH TWO (2) TIMES DAILY 60 tablet 6  . clotrimazole-betamethasone (LOTRISONE) cream 3 (three) times daily as needed.    . fish oil-omega-3 fatty acids 1000 MG capsule Take 1 g by mouth 3 (three) times daily.    . isosorbide mononitrate (IMDUR) 60 MG 24 hr tablet Take 90 mg by mouth daily.    Marland Kitchen ketoconazole (NIZORAL) 2 % cream Apply 1 application topically as needed.    . metoprolol tartrate (LOPRESSOR) 25 MG tablet Take 25 mg by mouth 2 (two) times daily.    . niacin (NIASPAN) 500 MG CR tablet Take 500 mg by mouth at bedtime.    Marland Kitchen NITROSTAT 0.4 MG SL tablet Place 1 tablet under the tongue every 5 (five) minutes x 3 doses as needed.    . rosuvastatin (CRESTOR) 40 MG tablet Take 1/3 tablet by mouth daily.     No current facility-administered medications for this visit.   Facility-Administered Medications  Ordered in Other Visits  Medication Dose Route Frequency Provider Last Rate Last Dose  . sodium chloride 0.9 % injection 3 mL  3 mL Intravenous PRN Pixie Casino, MD        Review of Systems : See HPI for pertinent positives and negatives.  Physical Examination  Filed Vitals:   06/25/15 1335 06/25/15 1336  BP: 128/61 135/69  Pulse: 59 60  Temp: 97.3 F (36.3 C)   Resp: 18   Height: 5' 9.5" (1.765 m)   Weight: 189 lb (85.73 kg)   SpO2: 98%    Body mass index is 27.52 kg/(m^2).  General: WDWN male in NAD GAIT: normal Eyes: PERRLA Pulmonary: CTAB, no rales, rhonchi, or wheezing.  Cardiac: regular rhythm,positive murmur.  VASCULAR EXAM Carotid Bruits Left Right   Transmitted cardiac murmur Transmitted cardiac murmur   Aorta is not palpable. Radial pulses are 2+ palpable and equal.       LE Pulses LEFT RIGHT   POPLITEAL not palpable  not palpable   POSTERIOR TIBIAL  palpable   palpable    DORSALIS PEDIS   palpable  palpable     Gastrointestinal: soft, nontender, BS WNL, no r/g, no palpated masses.  Musculoskeletal: Negative muscle atrophy/wasting. M/S 5/5 throughout, Extremities without ischemic changes.  Neurologic: A&O X 3; Appropriate Affect, Speech is normal CN 2-12 intact except for mild right sided facial droop, Pain and light touch intact in extremities, Motor exam as listed above.           Non-Invasive Vascular Imaging CAROTID DUPLEX 06/25/2015   CEREBROVASCULAR DUPLEX EVALUATION    INDICATION: Carotid artery stenosis    PREVIOUS INTERVENTION(S): Right carotid endarterectomy 07/25/2007; Left carotid endarterectomy 04/26/2007     DUPLEX EXAM:     RIGHT  LEFT  Peak Systolic Velocities (cm/s) End Diastolic Velocities (cm/s) Plaque LOCATION Peak Systolic Velocities (cm/s) End Diastolic Velocities (cm/s) Plaque  119 18  CCA PROXIMAL 91 14   73 16 HT CCA MID 76 16   59 15 HM CCA DISTAL 67 15   86 6  ECA 94 12 HT  74 17 CP ICA PROXIMAL 110 22   55 18  ICA MID 78 28   83 27  ICA DISTAL 61 24     Carotid endarterectomy ICA / CCA Ratio (PSV) Carotid endarterectomy  Antegrade Vertebral Flow Antegrade  NA Brachial Systolic Pressure (mmHg) NA  NA Brachial Artery Waveforms NA    Plaque Morphology:  HM = Homogeneous, HT = Heterogeneous, CP = Calcific Plaque, SP = Smooth Plaque, IP = Irregular Plaque     ADDITIONAL FINDINGS: Right subclavian artery PSV 91cm/sec; Left subclavian artery PSV144cm/sec    IMPRESSION: Patent bilateral internal carotid arteries with history of carotid endarterectomy, no hemodynamically significant plaque present.    Compared to the previous exam:  Essentially unchanged since  previous study on 06/19/2014.      Assessment: [name] is a 81 y.o. male who is status post staged bilateral CEAs in September and December 2008. He had a preoperative TIA in September of 2008, none subsequently. Today's carotid duplex suggests patent bilateral internal carotid arteries with history of carotid endarterectomy, no hemodynamically significant plaque presen Essentially unchanged since previous study on 06/19/2014.    Plan: Follow-up in 1 year with Carotid Duplex scan.   I discussed in depth with the patient the nature of atherosclerosis, and emphasized the importance of maximal medical management including strict control of blood pressure, blood glucose, and lipid levels,  obtaining regular exercise, and continued cessation of smoking.  The patient is aware that without maximal medical management the underlying atherosclerotic disease process will progress, limiting the benefit of any interventions. The patient was given information about stroke prevention and what symptoms should prompt the patient to seek immediate medical care. Thank you for allowing [place] to participate in this patient's care.  Clemon Chambers, RN, MSN, FNP-C [facility] Office: [facility] Physician: Trula Slade  06/25/2015 1:51 PM

## 2015-07-19 DIAGNOSIS — L309 Dermatitis, unspecified: Secondary | ICD-10-CM | POA: Diagnosis not present

## 2015-07-23 ENCOUNTER — Other Ambulatory Visit: Payer: Self-pay | Admitting: Internal Medicine

## 2015-07-23 NOTE — Telephone Encounter (Signed)
REFILL 

## 2015-08-06 ENCOUNTER — Telehealth: Payer: Self-pay | Admitting: Internal Medicine

## 2015-08-06 NOTE — Telephone Encounter (Signed)
Patient calling the office for samples of medication:   1.  What medication and dosage are you requesting samples for? Brilnta  2.  Are you currently out of this medication? No,2 days left

## 2015-08-06 NOTE — Telephone Encounter (Signed)
Returned call to [name] 90 mg samples left at [facility] office front desk. 

## 2015-09-04 ENCOUNTER — Other Ambulatory Visit: Payer: Self-pay | Admitting: Internal Medicine

## 2015-09-04 MED ORDER — TICAGRELOR 90 MG PO TABS: 90.0000 mg | ORAL_TABLET | Freq: Two times a day (BID) | ORAL | Status: DC

## 2015-09-04 NOTE — Telephone Encounter (Signed)
Sample placed up front, pt notified, voiced understanding`

## 2015-09-04 NOTE — Telephone Encounter (Signed)
Patient calling the office for samples of medication:   1.  What medication and dosage are you requesting samples for? Brilinta   2.  Are you currently out of this medication? Yes

## 2015-09-11 ENCOUNTER — Encounter: Payer: Self-pay | Admitting: Internal Medicine

## 2015-09-11 ENCOUNTER — Ambulatory Visit (INDEPENDENT_AMBULATORY_CARE_PROVIDER_SITE_OTHER): Payer: Medicare Other | Admitting: Internal Medicine

## 2015-09-11 VITALS — BP 144/74 | HR 55 | Ht 69.0 in | Wt 190.0 lb

## 2015-09-11 DIAGNOSIS — R0789 Other chest pain: Secondary | ICD-10-CM | POA: Diagnosis not present

## 2015-09-11 DIAGNOSIS — I350 Nonrheumatic aortic (valve) stenosis: Secondary | ICD-10-CM | POA: Diagnosis not present

## 2015-09-11 DIAGNOSIS — I447 Left bundle-branch block, unspecified: Secondary | ICD-10-CM | POA: Diagnosis not present

## 2015-09-11 DIAGNOSIS — I200 Unstable angina: Secondary | ICD-10-CM

## 2015-09-11 DIAGNOSIS — I10 Essential (primary) hypertension: Secondary | ICD-10-CM

## 2015-09-11 NOTE — Progress Notes (Signed)
OFFICE NOTE  Chief Complaint:  Exertional chest pain  Primary Care Physician: [name], MD  HPI:  HENCE KOSTEN is a 83 year old gentleman who had unfortunate aggressive restenosis of an RCA lesion in the past, recently restented. He presented with worsening anginal symptoms and medical therapy was recommended in November. He was scheduled for a nuclear stress test which he underwent on July 21, 2012, and this was negative for ischemia. Recently, he had been experiencing some soreness in his left chest wall which has been going on and off for about a month. He is not experiencing the shortness of breath that he previously experienced with his angina, especially when walking up hills. He noted that he had an episode of a fall when he was trying to lift a very heavy deer, landed on his back and perhaps strained or sprained intercostal muscles or something in the chest wall. Based on his symptoms he underwent stress testing which indicated ischemia and he underwent a repeat coronary intervention to the RCA.   At his last office visit he was doing fairly well, without worsening shortness of breath or chest discomfort.  [place] but noted a small increase in shortness of breath with activity. He's also had weight gain recently and some lower extremity swelling. He says his symptoms are somewhat reminiscent of the symptoms that required prior coronary intervention about one year ago, but not as significant. Of note, he does have moderate to severe aortic stenosis which were touching very closely and a history of diastolic dysfunction plus or minus heart failure.  Mr. Vereb returns today and is feeling quite well. He denies any chest pain or shortness of breath. He told me of an episode recently where he helped deliver car and had to walk back home. Walked about 2 half miles during the middle of the day in the heat and had no problems or  shortness of breath or chest discomfort. He actually is feeling the best he has in some time.  I saw Nhia back in the office today. He continues to be active. He's been hunting recently and doing a lot of exertion without any worsening shortness of breath or chest pain. He recently had problems with some pain in his legs which he attributed to combination of Crestor and that he had. He discontinued his area and wean down his Crestor. He is currently getting 40 mg tablets and breaking them into thirds. He's taking one third of a 40 mg tablet are roughly 13 mg daily which she says is tolerable. His most recent lipid profile from September 2015 shows a total cholesterol 118, temperature 31, HDL 59 and LDL of 43 which is very low. He should be able to tolerate the decrease in his Crestor and still be at target numbers. He did have a lipoma profile and MR in March 2015. I believe there was an air in triglycerides which read over 700, but LDL-C was less than 100, LDL-P was 700 and generally showed a very favorable profile.  Mr. Sweet returns today in the office for follow-up. Overall he reports doing fairly well. He continues to be active during the summer denies any chest pain or worsening shortness of breath. He has follow-up of his carotid endarterectomies in November. We are following him for aortic stenosis and he does not seem to be symptomatic with that.  I saw Mr. Trump back in the office today for  follow-up. He reports that he has developed again some exertional chest pain over the past few weeks. He feels like a soreness in the left chest wall, but it is also deep inside. Can come at rest, but worse with exertion. There is some exertional dyspnea as well. He has moderate AS by his most recent echo in 03/2015.   PMHx:  Past Medical History  Diagnosis Date  . Shortness of breath   . Chronic kidney disease   . Coronary artery disease   . Hypertension   . Stroke ([place])     hx of TIA  . Blood  transfusion     "  no reaction to transfusion "  . Dysrhythmia     LLB  anf leaking valve per patient  . Dyspnea on exertion, anginal equivilant 10/16/2010    2D Echo - EF >55%, mild-moderate mitral regurgiation, mild-moderate tricuspid regurgitation, moderate calcification of the aortic leaflets  . CAD (coronary artery disease), CABG 1996. 09/23/2011    2D Echo - EF >55%, moderate calcification of the aortic valve leaflets  . HTN (hypertension),uncontrolled 09/16/2011  . CKD (chronic kidney disease) stage 2, GFR 60-89 ml/min 09/16/2011  . Pulmonary hypertension, mild 09/17/2011  . Angina effort, 12/2011 with DOE anginal equivlent 12/22/2011  . Abnormal cardiovascular stress test, 11/2011 wint inferolateral reveribility 12/22/2011  . Recurrent angina status post rotational atherectomy, with PTCA for ISR to RCA, 12/22/2011 12/22/2011  . Carotid artery disease, Rt. CEA, 08/04/07, Lt. CEA 04/26/07. 12/23/2011  . Crescendo angina ([place]) 06/09/2012  . CVA (cerebral vascular accident) ([place]) 04/22/2007    2D Echo - EF 50-55%, aortic valve thickness mild-moderately increased,  . Peripheral arterial disease [facility])     Past Surgical History  Procedure Laterality Date  . Coronary artery bypass graft  08/04/2007  . Cardiac catheterization Left 06/10/2012    Ostial RCA 99% stenosis pre-dilation 3x41mm Angiosculpt PTCA balloon, 3.5x69mm Promus Element DES overlapping proximal stent and into the Aorto-ostium, post-dilated with4x83mm [place] Trek balloon - 99% stenosis reduced to 0%  . Coronary angioplasty    . Cea  2008  . Cardiac catheterization Left 12/22/2011    Left Main-diffuse tapering 50-60% stenosis; mild-moderate diseaseof the right ventricle branch of the right coronary artery; will plan FFR of ostial RCA  . Fractional flow reserve wire  12/22/2011    Ostial RCA, FFR ratio-0.82, physiologically significant, 3.25x37mm Flextome Cutting balloon deployed at ~3.90mm final estimated diameter  . Cardiac catheterization Bilateral  09/16/2011    Severe 90% ostial RCA disease which is calcified, will likely need PCI to ostial RCA with calcification  . Cardiac catheterization  09/18/2011    Calcified ostial 90-95% proximal RCA stenosis, 3x53mm Emerge balloon predilation, 3x48mm DES PROMUS Element stent was placed, post stent dilation done utilizing a 3.25x6mm noncompliant Quantum Apex balloon, 90-95% stenosis reduced to 0%  .  fluid knee Right Sept. 6, 2014    Fluid removed  . Left and right heart catheterization with coronary angiogram Left 09/16/2011    Procedure: LEFT AND RIGHT HEART CATHETERIZATION WITH CORONARY ANGIOGRAM;  Surgeon: Pixie Casino, MD;  Location: [facility] CATH LAB;  Service: Cardiovascular;  Laterality: Left;  . Percutaneous coronary stent intervention (pci-s) N/A 09/18/2011    Procedure: PERCUTANEOUS CORONARY STENT INTERVENTION (PCI-S);  Surgeon: Troy Sine, MD;  Location: [facility] CATH LAB;  Service: Cardiovascular;  Laterality: N/A;  . Temporary pacemaker insertion  09/18/2011    Procedure: TEMPORARY PACEMAKER INSERTION;  Surgeon: Troy Sine, MD;  Location: [facility] CATH  LAB;  Service: Cardiovascular;;  . Percutaneous coronary rotoblator intervention (pci-r)  09/18/2011    Procedure: PERCUTANEOUS CORONARY ROTOBLATOR INTERVENTION (PCI-R);  Surgeon: Troy Sine, MD;  Location: [facility] CATH LAB;  Service: Cardiovascular;;  . Left heart catheterization with coronary/graft angiogram N/A 12/22/2011    Procedure: LEFT HEART CATHETERIZATION WITH Beatrix Fetters;  Surgeon: Pixie Casino, MD;  Location: [facility] CATH LAB;  Service: Cardiovascular;  Laterality: N/A;  . Fractional flow reserve wire Right 12/22/2011    Procedure: FRACTIONAL FLOW RESERVE WIRE;  Surgeon: Pixie Casino, MD;  Location: [facility] CATH LAB;  Service: Cardiovascular;  Laterality: Right;  . Left heart catheterization with coronary angiogram N/A 06/10/2012    Procedure: LEFT HEART CATHETERIZATION WITH CORONARY ANGIOGRAM;  Surgeon: Pixie Casino, MD;   Location: [facility] CATH LAB;  Service: Cardiovascular;  Laterality: N/A;    FAMHx:  Family History  Problem Relation Age of Onset  . Cancer Mother   . Heart disease Mother   . Stroke Mother   . Alcohol abuse Father   . Depression Father   . Alzheimer's disease Father   . Heart disease Brother     Heart bypass in 1996  . Cancer Brother     Prostate  . Cancer Sister     SOCHx:   reports that he has never smoked. He has never used smokeless tobacco. He reports that he does not drink alcohol or use illicit drugs.  ALLERGIES:  Allergies  Allergen Reactions  . Atorvastatin Other (See Comments)    Muscle and joint pain, can tolerate rosuvastatin    ROS: A comprehensive review of systems was negative except for: Cardiovascular: positive for chest pain and dyspnea  HOME MEDS: Current Outpatient Prescriptions  Medication Sig Dispense Refill  . amLODipine (NORVASC) 10 MG tablet Take 1 tablet (10 mg total) by mouth at bedtime. 30 tablet 6  . aspirin EC 81 MG tablet Take 81 mg by mouth daily.    . clotrimazole-betamethasone (LOTRISONE) cream 3 (three) times daily as needed.    . fish oil-omega-3 fatty acids 1000 MG capsule Take 1 g by mouth 3 (three) times daily.    . isosorbide mononitrate (IMDUR) 60 MG 24 hr tablet TAKE 1 & 1/2 TABLETS BY MOUTH ONCE DAILY 45 tablet 2  . ketoconazole (NIZORAL) 2 % cream Apply 1 application topically as needed.    . metoprolol tartrate (LOPRESSOR) 25 MG tablet Take 25 mg by mouth 2 (two) times daily.    . niacin (NIASPAN) 500 MG CR tablet Take 500 mg by mouth at bedtime.    Marland Kitchen NITROSTAT 0.4 MG SL tablet Place 1 tablet under the tongue every 5 (five) minutes x 3 doses as needed.    . rosuvastatin (CRESTOR) 40 MG tablet Take 1/3 tablet by mouth daily.    . ticagrelor (BRILINTA) 90 MG TABS tablet Take 1 tablet (90 mg total) by mouth 2 (two) times daily. 30 tablet 0   No current facility-administered medications for this visit.   Facility-Administered  Medications Ordered in Other Visits  Medication Dose Route Frequency Provider Last Rate Last Dose  . sodium chloride 0.9 % injection 3 mL  3 mL Intravenous PRN Pixie Casino, MD        LABS/IMAGING: No results found for this or any previous visit (from the past 48 hour(s)). No results found.  VITALS: BP 144/74 mmHg  Pulse 55  Ht 5\' 9"  (1.753 m)  Wt 190 lb (86.183 kg)  BMI 28.05 kg/m2  EXAM: GEN: Awake, in no distress HEENT: PERRLA, EOMI Lungs: Clear bilaterally Cardiovascular: Regular rate and rhythm, normal S1, S2, 3/6 mid to late peaking systolic murmur at the right upper shoulder border Abdomen: Soft, nontender Extremities: No edema Neurologic: Grossly nonfocal Psychiatric: Normal mood and affect   EKG: sinus bradycardia at 55 with a left bundle branch block and PVC's  ASSESSMENT: 1. Progressive exertional chest pain - possibly unstable angina 2. Coronary artery disease with multiple recent PCI to the right coronary 3. History of bilateral carotid endarterectomies 4. Hypertension 5. Dyslipidemia 6. Left bundle branch block 7. Stage III chronic kidney disease 8. Moderate aortic stenosis 9. DOE - negative nuclear stress test 09/02/2013  PLAN: 1.   Mr. Prevatte has now developed left-sided exertional chest pain - different from prior episodes. Sometimes at rest, but worse with exertion. Some mild associated dyspnea. He has known moderate AS. Not clear if this is angina again or not. He has not taken nitroglycerin. Would recommend a repeat exercise treadmill stress test. He does have a LBBB which will make EKG uninterpretable, but I'm interested in his exercise capacity, the ability to provoke symptoms with exercise and the perfusion images, mostly.  Plan to see him back to discuss those results in a few weeks.  Pixie Casino, MD, [facility] Attending Cardiologist [place] C Wyley Hack 09/11/2015, 10:23 AM

## 2015-09-11 NOTE — Patient Instructions (Signed)
Your physician has requested that you have en exercise stress myoview. For further information please visit [email or web address]. Please follow instruction sheet, as given.  Your physician recommends that you schedule a follow-up appointment in: 4-6 weeks with Dr Debara Pickett.

## 2015-09-18 ENCOUNTER — Telehealth (HOSPITAL_COMMUNITY): Payer: Self-pay

## 2015-09-18 NOTE — Telephone Encounter (Signed)
Encounter complete. 

## 2015-09-20 ENCOUNTER — Telehealth: Payer: Self-pay | Admitting: *Deleted

## 2015-09-20 ENCOUNTER — Ambulatory Visit (HOSPITAL_COMMUNITY)
Admission: RE | Admit: 2015-09-20 | Discharge: 2015-09-20 | Disposition: A | Discharge status: Home / Self Care | Payer: Medicare Other | Source: Ambulatory Visit | Attending: Internal Medicine | Admitting: Internal Medicine

## 2015-09-20 DIAGNOSIS — I447 Left bundle-branch block, unspecified: Secondary | ICD-10-CM | POA: Insufficient documentation

## 2015-09-20 DIAGNOSIS — Z6828 Body mass index (BMI) 28.0-28.9, adult: Secondary | ICD-10-CM | POA: Diagnosis not present

## 2015-09-20 DIAGNOSIS — I350 Nonrheumatic aortic (valve) stenosis: Secondary | ICD-10-CM | POA: Diagnosis not present

## 2015-09-20 DIAGNOSIS — R0789 Other chest pain: Secondary | ICD-10-CM | POA: Insufficient documentation

## 2015-09-20 DIAGNOSIS — R0609 Other forms of dyspnea: Secondary | ICD-10-CM | POA: Diagnosis not present

## 2015-09-20 DIAGNOSIS — I739 Peripheral vascular disease, unspecified: Secondary | ICD-10-CM | POA: Diagnosis not present

## 2015-09-20 DIAGNOSIS — Z8249 Family history of ischemic heart disease and other diseases of the circulatory system: Secondary | ICD-10-CM | POA: Insufficient documentation

## 2015-09-20 DIAGNOSIS — I779 Disorder of arteries and arterioles, unspecified: Secondary | ICD-10-CM | POA: Diagnosis not present

## 2015-09-20 DIAGNOSIS — I10 Essential (primary) hypertension: Secondary | ICD-10-CM | POA: Insufficient documentation

## 2015-09-20 DIAGNOSIS — E663 Overweight: Secondary | ICD-10-CM | POA: Diagnosis not present

## 2015-09-20 LAB — MYOCARDIAL PERFUSION IMAGING
CHL CUP MPHR: 141 {beats}/min
CHL CUP NUCLEAR SDS: 1
CHL CUP NUCLEAR SRS: 1
CHL CUP RESTING HR STRESS: 60 {beats}/min
CHL RATE OF PERCEIVED EXERTION: 17
CSEPED: 5 min
CSEPEDS: 46 s
CSEPEW: 7 METS
LV sys vol: 51 mL
LVDIAVOL: 120 mL
NUC STRESS TID: 1.07
Peak HR: 131 {beats}/min
Percent HR: 92 %
SSS: 2

## 2015-09-20 MED ORDER — TICAGRELOR 90 MG PO TABS: 90.0000 mg | ORAL_TABLET | Freq: Two times a day (BID) | ORAL | Status: DC

## 2015-09-20 MED ORDER — TECHNETIUM TC 99M SESTAMIBI GENERIC - CARDIOLITE
11.0000 | Freq: Once | INTRAVENOUS | Status: AC | PRN
  Administered 2015-09-20: 11 via INTRAVENOUS

## 2015-09-20 MED ORDER — TECHNETIUM TC 99M SESTAMIBI GENERIC - CARDIOLITE
30.9000 | Freq: Once | INTRAVENOUS | Status: AC | PRN
  Administered 2015-09-20: 30.9 via INTRAVENOUS

## 2015-09-20 NOTE — Telephone Encounter (Signed)
Patient would like samples of brilinta 90 mg  Bottles x3  GIVEN TO PATIENT

## 2015-09-26 ENCOUNTER — Telehealth: Payer: Self-pay | Admitting: Internal Medicine

## 2015-09-26 NOTE — Telephone Encounter (Signed)
Pt is returning a call from the nurse and the results from his stress test that was done on 2/2. Please f/u with him  Thanks

## 2015-09-26 NOTE — Telephone Encounter (Signed)
Spoke to patient.  STRESS TESTResult given . Verbalized understanding

## 2015-10-16 ENCOUNTER — Encounter: Payer: Self-pay | Admitting: Internal Medicine

## 2015-10-16 ENCOUNTER — Ambulatory Visit (INDEPENDENT_AMBULATORY_CARE_PROVIDER_SITE_OTHER): Payer: Medicare Other | Admitting: Internal Medicine

## 2015-10-16 VITALS — BP 114/80 | HR 48 | Ht 69.0 in | Wt 186.0 lb

## 2015-10-16 DIAGNOSIS — I447 Left bundle-branch block, unspecified: Secondary | ICD-10-CM | POA: Diagnosis not present

## 2015-10-16 DIAGNOSIS — I2510 Atherosclerotic heart disease of native coronary artery without angina pectoris: Secondary | ICD-10-CM

## 2015-10-16 DIAGNOSIS — R0609 Other forms of dyspnea: Secondary | ICD-10-CM | POA: Diagnosis not present

## 2015-10-16 MED ORDER — NITROGLYCERIN 0.4 MG SL SUBL: 0.4000 mg | SUBLINGUAL_TABLET | SUBLINGUAL | Status: DC | PRN

## 2015-10-16 NOTE — Patient Instructions (Addendum)
Your physician wants you to follow-up in: 6 months with Dr. Debara Pickett. You will receive a reminder letter in the mail two months in advance. If you don't receive a letter, please call our office to schedule the follow-up appointment.  Brilinta Samples - 4 bottles - provided to patient

## 2015-10-16 NOTE — Progress Notes (Signed)
OFFICE NOTE  Chief Complaint:  Follow-up stress test  Primary Care Physician: [name], MD  HPI:  [name] is a 82 year old gentleman who had unfortunate aggressive restenosis of an RCA lesion in the past, recently restented. He presented with worsening anginal symptoms and medical therapy was recommended in November. He was scheduled for a nuclear stress test which he underwent on July 21, 2012, and this was negative for ischemia. Recently, he had been experiencing some soreness in his left chest wall which has been going on and off for about a month. He is not experiencing the shortness of breath that he previously experienced with his angina, especially when walking up hills. He noted that he had an episode of a fall when he was trying to lift a very heavy deer, landed on his back and perhaps strained or sprained intercostal muscles or something in the chest wall. Based on his symptoms he underwent stress testing which indicated ischemia and he underwent a repeat coronary intervention to the RCA.   At his last office visit he was doing fairly well, without worsening shortness of breath or chest discomfort.  [place] but noted a small increase in shortness of breath with activity. He's also had weight gain recently and some lower extremity swelling. He says his symptoms are somewhat reminiscent of the symptoms that required prior coronary intervention about one year ago, but not as significant. Of note, he does have moderate to severe aortic stenosis which were touching very closely and a history of diastolic dysfunction plus or minus heart failure.  [name] and is feeling quite well. He denies any chest pain or shortness of breath. He told me of an episode recently where he helped deliver car and had to walk back home. Walked about 2 half miles during the middle of the day in the heat and had no problems or  shortness of breath or chest discomfort. He actually is feeling the best he has in some time.  [name] back in the office today. He continues to be active. He's been hunting recently and doing a lot of exertion without any worsening shortness of breath or chest pain. He recently had problems with some pain in his legs which he attributed to combination of Crestor and that he had. He discontinued his area and wean down his Crestor. He is currently getting 40 mg tablets and breaking them into thirds. He's taking one third of a 40 mg tablet are roughly 13 mg daily which she says is tolerable. His most recent lipid profile from September 2015 shows a total cholesterol 118, temperature 31, HDL 59 and LDL of 43 which is very low. He should be able to tolerate the decrease in his Crestor and still be at target numbers. He did have a lipoma profile and MR in March 2015. I believe there was an air in triglycerides which read over 700, but LDL-C was less than 100, LDL-P was 700 and generally showed a very favorable profile.  [name] in the office for follow-up. Overall he reports doing fairly well. He continues to be active during the summer denies any chest pain or worsening shortness of breath. He has follow-up of his carotid endarterectomies in November. We are following him for aortic stenosis and he does not seem to be symptomatic with that.  [name] back in the office today for  follow-up. He reports that he has developed again some exertional chest pain over the past few weeks. He feels like a soreness in the left chest wall, but it is also deep inside. Can come at rest, but worse with exertion. There is some exertional dyspnea as well. He has moderate AS by his most recent echo in 03/2015.   [name]. He underwent nuclear stress testing which was negative for ischemia. EF was normal at 58%. He reports his chest discomfort is gone away. He is now getting over  a chest cold. I suspect some of his pain was musculoskeletal.  PMHx:  Past Medical History  Diagnosis Date  . Shortness of breath   . Chronic kidney disease   . Coronary artery disease   . Hypertension   . Stroke ([place])     hx of TIA  . Blood transfusion     "  no reaction to transfusion "  . Dysrhythmia     LLB  anf leaking valve per patient  . Dyspnea on exertion, anginal equivilant 10/16/2010    2D Echo - EF >55%, mild-moderate mitral regurgiation, mild-moderate tricuspid regurgitation, moderate calcification of the aortic leaflets  . CAD (coronary artery disease), CABG 1996. 09/23/2011    2D Echo - EF >55%, moderate calcification of the aortic valve leaflets  . HTN (hypertension),uncontrolled 09/16/2011  . CKD (chronic kidney disease) stage 2, GFR 60-89 ml/min 09/16/2011  . Pulmonary hypertension, mild 09/17/2011  . Angina effort, 12/2011 with DOE anginal equivlent 12/22/2011  . Abnormal cardiovascular stress test, 11/2011 wint inferolateral reveribility 12/22/2011  . Recurrent angina status post rotational atherectomy, with PTCA for ISR to RCA, 12/22/2011 12/22/2011  . Carotid artery disease, Rt. CEA, 08/04/07, Lt. CEA 04/26/07. 12/23/2011  . Crescendo angina ([place]) 06/09/2012  . CVA (cerebral vascular accident) ([place]) 04/22/2007    2D Echo - EF 50-55%, aortic valve thickness mild-moderately increased,  . Peripheral arterial disease [facility])     Past Surgical History  Procedure Laterality Date  . Coronary artery bypass graft  08/04/2007  . Cardiac catheterization Left 06/10/2012    Ostial RCA 99% stenosis pre-dilation 3x62mm Angiosculpt PTCA balloon, 3.5x34mm Promus Element DES overlapping proximal stent and into the Aorto-ostium, post-dilated with4x40mm [place] balloon - 99% stenosis reduced to 0%  . Coronary angioplasty    . Cea  2008  . Cardiac catheterization Left 12/22/2011    Left Main-diffuse tapering 50-60% stenosis; mild-moderate diseaseof the right ventricle branch of the right coronary  artery; will plan FFR of ostial RCA  . Fractional flow reserve wire  12/22/2011    Ostial RCA, FFR ratio-0.82, physiologically significant, 3.25x63mm Flextome Cutting balloon deployed at ~3.49mm final estimated diameter  . Cardiac catheterization Bilateral 09/16/2011    Severe 90% ostial RCA disease which is calcified, will likely need PCI to ostial RCA with calcification  . Cardiac catheterization  09/18/2011    Calcified ostial 90-95% proximal RCA stenosis, 3x54mm Emerge balloon predilation, 3x88mm DES PROMUS Element stent was placed, post stent dilation done utilizing a 3.25x56mm noncompliant Quantum Apex balloon, 90-95% stenosis reduced to 0%  .  fluid knee Right Sept. 6, 2014    Fluid removed  . Left and right heart catheterization with coronary angiogram Left 09/16/2011    Procedure: LEFT AND RIGHT HEART CATHETERIZATION WITH CORONARY ANGIOGRAM;  Surgeon: Pixie Casino, MD;  Location: [facility] CATH LAB;  Service: Cardiovascular;  Laterality: Left;  . Percutaneous coronary stent intervention (pci-s) N/A 09/18/2011    Procedure:  PERCUTANEOUS CORONARY STENT INTERVENTION (PCI-S);  Surgeon: Troy Sine, MD;  Location: [facility] CATH LAB;  Service: Cardiovascular;  Laterality: N/A;  . Temporary pacemaker insertion  09/18/2011    Procedure: TEMPORARY PACEMAKER INSERTION;  Surgeon: Troy Sine, MD;  Location: [facility] CATH LAB;  Service: Cardiovascular;;  . Percutaneous coronary rotoblator intervention (pci-r)  09/18/2011    Procedure: PERCUTANEOUS CORONARY ROTOBLATOR INTERVENTION (PCI-R);  Surgeon: Troy Sine, MD;  Location: [facility] CATH LAB;  Service: Cardiovascular;;  . Left heart catheterization with coronary/graft angiogram N/A 12/22/2011    Procedure: LEFT HEART CATHETERIZATION WITH Beatrix Fetters;  Surgeon: Pixie Casino, MD;  Location: [facility] CATH LAB;  Service: Cardiovascular;  Laterality: N/A;  . Fractional flow reserve wire Right 12/22/2011    Procedure: FRACTIONAL FLOW RESERVE WIRE;  Surgeon: Pixie Casino, MD;  Location: [facility] CATH LAB;  Service: Cardiovascular;  Laterality: Right;  . Left heart catheterization with coronary angiogram N/A 06/10/2012    Procedure: LEFT HEART CATHETERIZATION WITH CORONARY ANGIOGRAM;  Surgeon: Pixie Casino, MD;  Location: [facility] CATH LAB;  Service: Cardiovascular;  Laterality: N/A;    FAMHx:  Family History  Problem Relation Age of Onset  . Cancer Mother   . Heart disease Mother   . Stroke Mother   . Alcohol abuse Father   . Depression Father   . Alzheimer's disease Father   . Heart disease Brother     Heart bypass in 1996  . Cancer Brother     Prostate  . Cancer Sister     SOCHx:   reports that he has never smoked. He has never used smokeless tobacco. He reports that he does not drink alcohol or use illicit drugs.  ALLERGIES:  Allergies  Allergen Reactions  . Atorvastatin Other (See Comments)    Muscle and joint pain, can tolerate rosuvastatin    ROS: Pertinent items noted in HPI and remainder of comprehensive ROS otherwise negative.  HOME MEDS: Current Outpatient Prescriptions  Medication Sig Dispense Refill  . amLODipine (NORVASC) 10 MG tablet Take 1 tablet (10 mg total) by mouth at bedtime. 30 tablet 6  . aspirin EC 81 MG tablet Take 81 mg by mouth daily.    . clotrimazole-betamethasone (LOTRISONE) cream 3 (three) times daily as needed.    . fish oil-omega-3 fatty acids 1000 MG capsule Take 1 g by mouth 3 (three) times daily.    . isosorbide mononitrate (IMDUR) 60 MG 24 hr tablet TAKE 1 & 1/2 TABLETS BY MOUTH ONCE DAILY 45 tablet 2  . metoprolol tartrate (LOPRESSOR) 25 MG tablet Take 25 mg by mouth 2 (two) times daily.    . niacin (NIASPAN) 500 MG CR tablet Take 500 mg by mouth at bedtime.    . nitroGLYCERIN (NITROSTAT) 0.4 MG SL tablet Place 1 tablet (0.4 mg total) under the tongue every 5 (five) minutes x 3 doses as needed. 25 tablet 3  . rosuvastatin (CRESTOR) 40 MG tablet Take 1/3 tablet by mouth daily.    . ticagrelor  (BRILINTA) 90 MG TABS tablet Take 1 tablet (90 mg total) by mouth 2 (two) times daily. 24 tablet 0   No current facility-administered medications for this visit.   Facility-Administered Medications Ordered in Other Visits  Medication Dose Route Frequency Provider Last Rate Last Dose  . sodium chloride 0.9 % injection 3 mL  3 mL Intravenous PRN Pixie Casino, MD        LABS/IMAGING: No results found for this or any previous visit (from  the past 48 hour(s)). No results found.  VITALS: BP 114/80 mmHg  Pulse 48  Ht 5\' 9"  (1.753 m)  Wt 186 lb (84.369 kg)  BMI 27.45 kg/m2  EXAM: deferred  EKG: deferred  ASSESSMENT: 1. Progressive exertional chest pain - low risk NST- EF 58% 2. Coronary artery disease with multiple recent PCI to the right coronary 3. History of bilateral carotid endarterectomies 4. Hypertension 5. Dyslipidemia 6. Left bundle branch block 7. Stage III chronic kidney disease 8. Moderate aortic stenosis 9. DOE - negative nuclear stress test 09/02/2013  PLAN: 1.   [name] was again having exertional symptoms however his pain was more in his left upper chest and shoulder. His stress test was negative which is reassuring and suggests it may been musculoskeletal. He also developed an upper respiratory infection which may been brewing at the time. Overall this is very reassuring we'll plan to see him back in 6 months and he has a follow-up with his primary care provider next month.  Pixie Casino, MD, [facility] Attending Cardiologist  C [name] 10/16/2015, 10:40 AM

## 2015-11-15 ENCOUNTER — Other Ambulatory Visit: Payer: Self-pay | Admitting: Endocrinology

## 2015-11-15 DIAGNOSIS — N289 Disorder of kidney and ureter, unspecified: Secondary | ICD-10-CM

## 2015-11-27 ENCOUNTER — Ambulatory Visit
Admission: RE | Admit: 2015-11-27 | Discharge: 2015-11-27 | Disposition: A | Discharge status: Home / Self Care | Payer: Medicare Other | Source: Ambulatory Visit | Attending: Endocrinology | Admitting: Endocrinology

## 2015-11-27 DIAGNOSIS — N289 Disorder of kidney and ureter, unspecified: Secondary | ICD-10-CM

## 2016-01-02 ENCOUNTER — Ambulatory Visit (INDEPENDENT_AMBULATORY_CARE_PROVIDER_SITE_OTHER): Payer: Medicare Other | Admitting: Ophthalmology

## 2016-01-02 DIAGNOSIS — H2513 Age-related nuclear cataract, bilateral: Secondary | ICD-10-CM

## 2016-01-02 DIAGNOSIS — H35033 Hypertensive retinopathy, bilateral: Secondary | ICD-10-CM | POA: Diagnosis not present

## 2016-01-02 DIAGNOSIS — H348312 Tributary (branch) retinal vein occlusion, right eye, stable: Secondary | ICD-10-CM

## 2016-01-02 DIAGNOSIS — H43813 Vitreous degeneration, bilateral: Secondary | ICD-10-CM | POA: Diagnosis not present

## 2016-01-02 DIAGNOSIS — I10 Essential (primary) hypertension: Secondary | ICD-10-CM

## 2016-01-29 ENCOUNTER — Telehealth: Payer: Self-pay | Admitting: *Deleted

## 2016-01-29 NOTE — Telephone Encounter (Signed)
Medication samples have been provided to the patient.  Drug name: Brilinta 90mg   Qty: 4 bottles = 32 tablets  LOT: [id number]  Exp.Date: 08/2018  Samples left at front desk for patient pick-up. Patient notified.  Sheral Apley M 10:59 AM 01/29/2016

## 2016-03-05 ENCOUNTER — Telehealth: Payer: Self-pay | Admitting: Internal Medicine

## 2016-03-05 NOTE — Telephone Encounter (Signed)
New message     Patient calling the office for samples of medication:   1.  What medication and dosage are you requesting samples for?[name] 90 mg po daily  2.  Are you currently out of this medication? no

## 2016-03-05 NOTE — Telephone Encounter (Signed)
Returned pt call-left message (Ok per [employer]) notifying pt that samples are ready to pick up.  Brilinta 90mg  LOT # [id number] EXP: 08/2018.

## 2016-03-08 ENCOUNTER — Other Ambulatory Visit: Payer: Self-pay | Admitting: Internal Medicine

## 2016-04-14 ENCOUNTER — Ambulatory Visit (INDEPENDENT_AMBULATORY_CARE_PROVIDER_SITE_OTHER): Payer: Medicare Other | Admitting: Internal Medicine

## 2016-04-14 ENCOUNTER — Encounter: Payer: Self-pay | Admitting: Internal Medicine

## 2016-04-14 VITALS — BP 130/62 | HR 56 | Ht 69.0 in | Wt 184.0 lb

## 2016-04-14 DIAGNOSIS — I2510 Atherosclerotic heart disease of native coronary artery without angina pectoris: Secondary | ICD-10-CM | POA: Diagnosis not present

## 2016-04-14 DIAGNOSIS — I10 Essential (primary) hypertension: Secondary | ICD-10-CM

## 2016-04-14 DIAGNOSIS — I350 Nonrheumatic aortic (valve) stenosis: Secondary | ICD-10-CM | POA: Diagnosis not present

## 2016-04-14 DIAGNOSIS — I6523 Occlusion and stenosis of bilateral carotid arteries: Secondary | ICD-10-CM | POA: Diagnosis not present

## 2016-04-14 DIAGNOSIS — I447 Left bundle-branch block, unspecified: Secondary | ICD-10-CM

## 2016-04-14 NOTE — Patient Instructions (Addendum)
Your physician has requested that you have an echocardiogram @ [address]. Echocardiography is a painless test that uses sound waves to create images of your heart. It provides your doctor with information about the size and shape of your heart and how well your heart's chambers and valves are working. This procedure takes approximately one hour. There are no restrictions for this procedure.  Your physician wants you to follow-up in: 6 months with Dr. Debara Pickett. You will receive a reminder letter in the mail two months in advance. If you don't receive a letter, please call our office to schedule the follow-up appointment.  brilinta samples - 5 bottles - provided to patient

## 2016-04-14 NOTE — Progress Notes (Signed)
OFFICE NOTE  Chief Complaint:  No complaints  Primary Care Physician: Steven Bolt, [name]  HPI:  DENY CHEVEZ is a 82 year old gentleman who had unfortunate aggressive restenosis of an RCA lesion in the past, recently restented. He presented with worsening anginal symptoms and medical therapy was recommended in November. He was scheduled for a nuclear stress test which he underwent on July 21, 2012, and this was negative for ischemia. Recently, he had been experiencing some soreness in his left chest wall which has been going on and off for about a month. He is not experiencing the shortness of breath that he previously experienced with his angina, especially when walking up hills. He noted that he had an episode of a fall when he was trying to lift a very heavy deer, landed on his back and perhaps strained or sprained intercostal muscles or something in the chest wall. Based on his symptoms he underwent stress testing which indicated ischemia and he underwent a repeat coronary intervention to the RCA.   At his last office visit he was doing fairly well, without worsening shortness of breath or chest discomfort.  [place] but noted a small increase in shortness of breath with activity. He's also had weight gain recently and some lower extremity swelling. He says his symptoms are somewhat reminiscent of the symptoms that required prior coronary intervention about one year ago, but not as significant. Of note, he does have moderate to severe aortic stenosis which were touching very closely and a history of diastolic dysfunction plus or minus heart failure.  [name] returns today and is feeling quite well. He denies any chest pain or shortness of breath. He told me of an episode recently where he helped deliver car and had to walk back home. Walked about 2 half miles during the middle of the day in the heat and had no problems or shortness of  breath or chest discomfort. He actually is feeling the best he has in some time.  I saw [name] back in the office today. He continues to be active. He's been hunting recently and doing a lot of exertion without any worsening shortness of breath or chest pain. He recently had problems with some pain in his legs which he attributed to combination of Crestor and that he had. He discontinued his area and wean down his Crestor. He is currently getting 40 mg tablets and breaking them into thirds. He's taking one third of a 40 mg tablet are roughly 13 mg daily which she says is tolerable. His most recent lipid profile from September 2015 shows a total cholesterol 118, temperature 31, HDL 59 and LDL of 43 which is very low. He should be able to tolerate the decrease in his Crestor and still be at target numbers. He did have a lipoma profile and MR in March 2015. I believe there was an air in triglycerides which read over 700, but LDL-C was less than 100, LDL-P was 700 and generally showed a very favorable profile.  [name] returns today in the office for follow-up. Overall he reports doing fairly well. He continues to be active during the summer denies any chest pain or worsening shortness of breath. He has follow-up of his carotid endarterectomies in November. We are following him for aortic stenosis and he does not seem to be symptomatic with that.  I saw [name] back in the office today for follow-up.  He reports that he has developed again some exertional chest pain over the past few weeks. He feels like a soreness in the left chest wall, but it is also deep inside. Can come at rest, but worse with exertion. There is some exertional dyspnea as well. He has moderate AS by his most recent echo in 03/2015.   Mr. Bhatti returns today for follow-up. He underwent nuclear stress testing which was negative for ischemia. EF was normal at 58%. He reports his chest discomfort is gone away. He is now getting over a chest cold.  I suspect some of his pain was musculoskeletal.  04/14/2016  Mr. Prout was seen back today in the office for follow-up. He reports that he is doing very well denies any shortness of breath or chest pain. He is very physically active. In February he underwent nuclear stress testing which was negative for ischemia. We are following moderate aortic stenosis which was seen on his echo last year. He does have stage III chronic kidney disease as well. He's followed by vein and vascular surgery for bilateral carotid endarterectomies. Blood pressure is well-controlled today. Recent lipid profile was within normal limits and is followed by Dr. [name].  PMHx:  Past Medical History:  Diagnosis Date  . Abnormal cardiovascular stress test, 11/2011 wint inferolateral reveribility 12/22/2011  . Angina effort, 12/2011 with DOE anginal equivlent 12/22/2011  . Blood transfusion    "  no reaction to transfusion "  . CAD (coronary artery disease), CABG 1996. 09/23/2011   2D Echo - EF >55%, moderate calcification of the aortic valve leaflets  . Carotid artery disease, Rt. CEA, 08/04/07, Lt. CEA 04/26/07. 12/23/2011  . Chronic kidney disease   . CKD (chronic kidney disease) stage 2, GFR 60-89 ml/min 09/16/2011  . Coronary artery disease   . Crescendo angina ([place]) 06/09/2012  . CVA (cerebral vascular accident) ([place]) 04/22/2007   2D Echo - EF 50-55%, aortic valve thickness mild-moderately increased,  . Dyspnea on exertion, anginal equivilant 10/16/2010   2D Echo - EF >55%, mild-moderate mitral regurgiation, mild-moderate tricuspid regurgitation, moderate calcification of the aortic leaflets  . Dysrhythmia    LLB  anf leaking valve per patient  . HTN (hypertension),uncontrolled 09/16/2011  . Hypertension   . Peripheral arterial disease ([place])   . Pulmonary hypertension, mild 09/17/2011  . Recurrent angina status post rotational atherectomy, with PTCA for ISR to RCA, 12/22/2011 12/22/2011  . Shortness of breath   . Stroke ([place])    hx  of TIA    Past Surgical History:  Procedure Laterality Date  .  Fluid knee Right Sept. 6, 2014   Fluid removed  . CARDIAC CATHETERIZATION Left 06/10/2012   Ostial RCA 99% stenosis pre-dilation 3x41mm Angiosculpt PTCA balloon, 3.5x23mm Promus Element DES overlapping proximal stent and into the Aorto-ostium, post-dilated with4x38mm [place] Trek balloon - 99% stenosis reduced to 0%  . CARDIAC CATHETERIZATION Left 12/22/2011   Left Main-diffuse tapering 50-60% stenosis; mild-moderate diseaseof the right ventricle branch of the right coronary artery; will plan FFR of ostial RCA  . CARDIAC CATHETERIZATION Bilateral 09/16/2011   Severe 90% ostial RCA disease which is calcified, will likely need PCI to ostial RCA with calcification  . CARDIAC CATHETERIZATION  09/18/2011   Calcified ostial 90-95% proximal RCA stenosis, 3x44mm Emerge balloon predilation, 3x54mm DES PROMUS Element stent was placed, post stent dilation done utilizing a 3.25x5mm noncompliant Quantum Apex balloon, 90-95% stenosis reduced to 0%  . CEA  2008  . CORONARY ANGIOPLASTY    .  CORONARY ARTERY BYPASS GRAFT  08/04/2007  . FRACTIONAL FLOW RESERVE WIRE  12/22/2011   Ostial RCA, FFR ratio-0.82, physiologically significant, 3.25x71mm Flextome Cutting balloon deployed at ~3.52mm final estimated diameter  . FRACTIONAL FLOW RESERVE WIRE Right 12/22/2011   Procedure: FRACTIONAL FLOW RESERVE WIRE;  Surgeon: Pixie Casino, [name];  Location: [facility] CATH LAB;  Service: Cardiovascular;  Laterality: Right;  . LEFT AND RIGHT HEART CATHETERIZATION WITH CORONARY ANGIOGRAM Left 09/16/2011   Procedure: LEFT AND RIGHT HEART CATHETERIZATION WITH CORONARY ANGIOGRAM;  Surgeon: Pixie Casino, [name];  Location: [facility] CATH LAB;  Service: Cardiovascular;  Laterality: Left;  . LEFT HEART CATHETERIZATION WITH CORONARY ANGIOGRAM N/A 06/10/2012   Procedure: LEFT HEART CATHETERIZATION WITH CORONARY ANGIOGRAM;  Surgeon: Pixie Casino, [name];  Location: [facility] CATH LAB;  Service:  Cardiovascular;  Laterality: N/A;  . LEFT HEART CATHETERIZATION WITH CORONARY/GRAFT ANGIOGRAM N/A 12/22/2011   Procedure: LEFT HEART CATHETERIZATION WITH Beatrix Fetters;  Surgeon: Pixie Casino, [name];  Location: [facility] CATH LAB;  Service: Cardiovascular;  Laterality: N/A;  . PERCUTANEOUS CORONARY ROTOBLATOR INTERVENTION (PCI-R)  09/18/2011   Procedure: PERCUTANEOUS CORONARY ROTOBLATOR INTERVENTION (PCI-R);  Surgeon: Troy Sine, [name];  Location[facility] CATH LAB;  Service: Cardiovascular;;  . PERCUTANEOUS CORONARY STENT INTERVENTION (PCI-S) N/A 09/18/2011   Procedure: PERCUTANEOUS CORONARY STENT INTERVENTION (PCI-S);  Surgeon: Troy Sine, [name];  Location: [facility] CATH LAB;  Service: Cardiovascular;  Laterality: N/A;  . TEMPORARY PACEMAKER INSERTION  09/18/2011   Procedure: TEMPORARY PACEMAKER INSERTION;  Surgeon: Troy Sine, [name];  Location: [facility] CATH LAB;  Service: Cardiovascular;;    FAMHx:  Family History  Problem Relation Age of Onset  . Cancer Mother   . Heart disease Mother   . Stroke Mother   . Alcohol abuse Father   . Depression Father   . Alzheimer's disease Father   . Heart disease Brother     Heart bypass in 1996  . Cancer Brother     Prostate  . Cancer Sister     SOCHx:   reports that he has never smoked. He has never used smokeless tobacco. He reports that he does not drink alcohol or use drugs.  ALLERGIES:  Allergies  Allergen Reactions  . Atorvastatin Other (See Comments)    Muscle and joint pain, can tolerate rosuvastatin    ROS: Pertinent items noted in HPI and remainder of comprehensive ROS otherwise negative.  HOME MEDS: Current Outpatient Prescriptions  Medication Sig Dispense Refill  . amLODipine (NORVASC) 10 MG tablet Take 1 tablet (10 mg total) by mouth at bedtime. 30 tablet 6  . aspirin EC 81 MG tablet Take 81 mg by mouth daily.    Marland Kitchen BRILINTA 90 MG TABS tablet TAKE 1 TABLET BY MOUTH TWICE A DAY 60 tablet 1  . clotrimazole-betamethasone (LOTRISONE)  cream 3 (three) times daily as needed.    . fish oil-omega-3 fatty acids 1000 MG capsule Take 1 g by mouth 3 (three) times daily.    . isosorbide mononitrate (IMDUR) 60 MG 24 hr tablet TAKE 1 & 1/2 TABLETS BY MOUTH ONCE DAILY 45 tablet 2  . metoprolol tartrate (LOPRESSOR) 25 MG tablet Take 25 mg by mouth 2 (two) times daily.    . niacin (NIASPAN) 500 MG CR tablet Take 500 mg by mouth at bedtime.    . nitroGLYCERIN (NITROSTAT) 0.4 MG SL tablet Place 1 tablet (0.4 mg total) under the tongue every 5 (five) minutes x 3 doses as needed. 25 tablet 3  . rosuvastatin (CRESTOR) 40  MG tablet Take 1/3 tablet by mouth daily.    . ticagrelor (BRILINTA) 90 MG TABS tablet Take 1 tablet (90 mg total) by mouth 2 (two) times daily. 24 tablet 0   No current facility-administered medications for this visit.    Facility-Administered Medications Ordered in Other Visits  Medication Dose Route Frequency Provider Last Rate Last Dose  . sodium chloride 0.9 % injection 3 mL  3 mL Intravenous PRN Pixie Casino, [name]        LABS/IMAGING: No results found for this or any previous visit (from the past 48 hour(s)). No results found.  VITALS: BP 130/62   Pulse (!) 56   Ht 5\' 9"  (1.753 m)   Wt 184 lb (83.5 kg)   BMI 27.17 kg/m   EXAM: General appearance: alert and no distress Neck: no carotid bruit and no JVD Lungs: clear to auscultation bilaterally Heart: regular rate and rhythm, S1, S2 normal and systolic murmur: systolic ejection 3/6, crescendo at 2nd right intercostal space Abdomen: soft, non-tender; bowel sounds normal; no masses,  no organomegaly Extremities: extremities normal, atraumatic, no cyanosis or edema Pulses: 2+ and symmetric Skin: Skin color, texture, turgor normal. No rashes or lesions Neurologic: Grossly normal Psych: Pleasant  EKG: Sinus bradycardia with first-degree AV block and PVCs at 56, left bundle branch block  ASSESSMENT: 1. Coronary artery disease with multiple recent PCI to the  right coronary - low risk NST- EF 58% (2017) 2. History of bilateral carotid endarterectomies 3. Hypertension 4. Dyslipidemia 5. Left bundle branch block 6. Stage III chronic kidney disease 7. Moderate aortic stenosis  PLAN: 1.   Mr. Jentsch denies any new shortness of breath or chest pain. He had a negative stress test this year. Blood pressures been stable cholesterol is at goal. Kidney disease is also stable. He is also followed by vein and vascular surgery for bilateral carotid endarterectomies. He will need a repeat echocardiogram as it's been more than one year since his last echo for ongoing evaluation of moderate aortic stenosis. Of note, he is on dual antiplatelet therapy with aspirin and Brilinta. We discussed very little data available for DAPT in patients with Brilinta long-term, however he did have multiple interventions mostly to the right coronary artery and it was advised in 2013 when he had his last stent that he should be on lifelong dual antiplatelet therapy. We discussed alternatives such as Plavix and aspirin versus Brilinta. At this time he seems to be tolerating Brilinta without any significant bleeding problems and cost is reasonable. Therefore will continue with this more potent antiplatelet medication.  Follow-up with me in 6 months.  Pixie Casino, [name], [facility] Attending Cardiologist [place] 04/14/2016, 10:18 AM

## 2016-04-25 ENCOUNTER — Ambulatory Visit (HOSPITAL_COMMUNITY): Payer: Medicare Other | Attending: Cardiovascular Disease

## 2016-04-25 ENCOUNTER — Other Ambulatory Visit: Payer: Self-pay

## 2016-04-25 DIAGNOSIS — I350 Nonrheumatic aortic (valve) stenosis: Secondary | ICD-10-CM

## 2016-04-25 DIAGNOSIS — I447 Left bundle-branch block, unspecified: Secondary | ICD-10-CM | POA: Diagnosis not present

## 2016-04-25 DIAGNOSIS — I352 Nonrheumatic aortic (valve) stenosis with insufficiency: Secondary | ICD-10-CM | POA: Diagnosis not present

## 2016-04-25 DIAGNOSIS — N189 Chronic kidney disease, unspecified: Secondary | ICD-10-CM | POA: Diagnosis not present

## 2016-04-25 DIAGNOSIS — I340 Nonrheumatic mitral (valve) insufficiency: Secondary | ICD-10-CM | POA: Diagnosis not present

## 2016-04-25 DIAGNOSIS — Z8673 Personal history of transient ischemic attack (TIA), and cerebral infarction without residual deficits: Secondary | ICD-10-CM | POA: Insufficient documentation

## 2016-04-25 DIAGNOSIS — R0602 Shortness of breath: Secondary | ICD-10-CM | POA: Insufficient documentation

## 2016-04-25 DIAGNOSIS — I272 Other secondary pulmonary hypertension: Secondary | ICD-10-CM | POA: Diagnosis not present

## 2016-04-25 DIAGNOSIS — I517 Cardiomegaly: Secondary | ICD-10-CM | POA: Diagnosis not present

## 2016-04-25 DIAGNOSIS — I071 Rheumatic tricuspid insufficiency: Secondary | ICD-10-CM | POA: Insufficient documentation

## 2016-04-25 DIAGNOSIS — I2510 Atherosclerotic heart disease of native coronary artery without angina pectoris: Secondary | ICD-10-CM | POA: Diagnosis not present

## 2016-05-21 ENCOUNTER — Telehealth: Payer: Self-pay | Admitting: Internal Medicine

## 2016-05-21 NOTE — Telephone Encounter (Signed)
Left msg for patient to inform him that samples are available at front desk for pickup.

## 2016-05-21 NOTE — Telephone Encounter (Signed)
New Message  Patient calling the office for samples of medication:   1.  What medication and dosage are you requesting samples for? Brilinta 90 mg tablets twice daily  2.  Are you currently out of this medication?   One day left

## 2016-06-24 ENCOUNTER — Telehealth: Payer: Self-pay | Admitting: Internal Medicine

## 2016-06-24 NOTE — Telephone Encounter (Signed)
Need samples of Brilinta.

## 2016-06-24 NOTE — Telephone Encounter (Signed)
Returned call. Wife spoke to me but informed me patient was not at home. She did wish to speak at this time. She did take message from me requesting patient to call back.

## 2016-06-25 ENCOUNTER — Encounter: Payer: Self-pay | Admitting: Family

## 2016-06-25 NOTE — Telephone Encounter (Signed)
Pt came in and samples were provided.  Medication Samples have been provided to the patient.  Drug name: Brilinta90mg Qty: 48LOT: [id number].Date: 2/20  The patient has been instructed regarding the correct time, dose, and frequency of taking this medication, including desired effects and most common side effects.

## 2016-06-25 NOTE — Telephone Encounter (Signed)
I have left pt a message requesting call back.

## 2016-06-27 ENCOUNTER — Encounter: Payer: Self-pay | Admitting: Family

## 2016-07-01 ENCOUNTER — Encounter: Payer: Self-pay | Admitting: Family

## 2016-07-01 ENCOUNTER — Ambulatory Visit (HOSPITAL_COMMUNITY)
Admission: RE | Admit: 2016-07-01 | Discharge: 2016-07-01 | Disposition: A | Discharge status: Home / Self Care | Payer: Medicare Other | Source: Ambulatory Visit | Attending: Vascular Surgery | Admitting: Vascular Surgery

## 2016-07-01 ENCOUNTER — Ambulatory Visit (INDEPENDENT_AMBULATORY_CARE_PROVIDER_SITE_OTHER): Payer: Medicare Other | Admitting: Family

## 2016-07-01 VITALS — BP 129/71 | HR 59 | Temp 97.4°F | Resp 20 | Ht 69.0 in | Wt 186.0 lb

## 2016-07-01 DIAGNOSIS — I6523 Occlusion and stenosis of bilateral carotid arteries: Secondary | ICD-10-CM

## 2016-07-01 DIAGNOSIS — Z9889 Other specified postprocedural states: Secondary | ICD-10-CM | POA: Diagnosis not present

## 2016-07-01 DIAGNOSIS — Z48812 Encounter for surgical aftercare following surgery on the circulatory system: Secondary | ICD-10-CM | POA: Diagnosis not present

## 2016-07-01 LAB — VAS US CAROTID
LEFT ECA DIAS: -9 cm/s
LICAPDIAS: -16 cm/s
Left CCA dist dias: 15 cm/s
Left CCA dist sys: 84 cm/s
Left CCA prox dias: 13 cm/s
Left CCA prox sys: 97 cm/s
Left ICA dist dias: -22 cm/s
Left ICA dist sys: -63 cm/s
Left ICA prox sys: -72 cm/s
RCCADSYS: -86 cm/s
RCCAPDIAS: 10 cm/s
RIGHT CCA MID DIAS: 14 cm/s
RIGHT ECA DIAS: -1 cm/s
RIGHT VERTEBRAL DIAS: 12 cm/s
Right CCA prox sys: 69 cm/s

## 2016-07-01 NOTE — Progress Notes (Signed)
Chief Complaint: Follow up Extracranial Carotid Artery Stenosis   History of Present Illness  [name] is a 82 y.o. male patient of Dr. Trula Slade who is status post staged bilateral CEAs in September and December 2008.  He returns today for follow up. He has 2 cardiac stents, denies any history of an MI. His cardiologist is Dr. Debara Pickett. He had a 3 vessel CABG in 1996.  He reports a TIA before Sept. 2008 as manifested by right arm weakness which has resolved, denies strokes or TIA symptoms since he had the CEA's. The patient denies any history of amaurosis fugax or monocular blindness or receptive or expressive aphasia.   He is quite physically active, denies claudication symptoms with walking.  He reports a known heart murmur.  Pt Diabetic: No Pt smoker: non-smoker  Pt meds include: Statin : Yes Betablocker: Yes ASA: Yes Other anticoagulants/antiplatelets: Brilinta since last cardiac stent placed.   Past Medical History:  Diagnosis Date  . Abnormal cardiovascular stress test, 11/2011 wint inferolateral reveribility 12/22/2011  . Angina effort, 12/2011 with DOE anginal equivlent 12/22/2011  . Blood transfusion    "  no reaction to transfusion "  . CAD (coronary artery disease), CABG 1996. 09/23/2011   2D Echo - EF >55%, moderate calcification of the aortic valve leaflets  . Carotid artery disease, Rt. CEA, 08/04/07, Lt. CEA 04/26/07. 12/23/2011  . Chronic kidney disease   . CKD (chronic kidney disease) stage 2, GFR 60-89 ml/min 09/16/2011  . Coronary artery disease   . Crescendo angina ([place]) 06/09/2012  . CVA (cerebral vascular accident) ([place]) 04/22/2007   2D Echo - EF 50-55%, aortic valve thickness mild-moderately increased,  . Dyspnea on exertion, anginal equivilant 10/16/2010   2D Echo - EF >55%, mild-moderate mitral regurgiation, mild-moderate tricuspid regurgitation, moderate calcification of the aortic leaflets  . Dysrhythmia    LLB  anf leaking valve per patient  . HTN  (hypertension),uncontrolled 09/16/2011  . Hypertension   . Peripheral arterial disease ([place])   . Pulmonary hypertension, mild 09/17/2011  . Recurrent angina status post rotational atherectomy, with PTCA for ISR to RCA, 12/22/2011 12/22/2011  . Shortness of breath   . Stroke ([place])    hx of TIA    Social History Social History  Substance Use Topics  . Smoking status: Never Smoker  . Smokeless tobacco: Never Used     Comment: rarely smoked when he did smoke   . Alcohol use No    Family History Family History  Problem Relation Age of Onset  . Cancer Mother   . Heart disease Mother   . Stroke Mother   . Alcohol abuse Father   . Depression Father   . Alzheimer's disease Father   . Heart disease Brother     Heart bypass in 1996  . Cancer Brother     Prostate  . Cancer Sister     Surgical History Past Surgical History:  Procedure Laterality Date  .  Fluid knee Right Sept. 6, 2014   Fluid removed  . CARDIAC CATHETERIZATION Left 06/10/2012   Ostial RCA 99% stenosis pre-dilation 3x3mm Angiosculpt PTCA balloon, 3.5x76mm Promus Element DES overlapping proximal stent and into the Aorto-ostium, post-dilated with4x78mm [place] Trek balloon - 99% stenosis reduced to 0%  . CARDIAC CATHETERIZATION Left 12/22/2011   Left Main-diffuse tapering 50-60% stenosis; mild-moderate diseaseof the right ventricle branch of the right coronary artery; will plan FFR of ostial RCA  . CARDIAC CATHETERIZATION Bilateral 09/16/2011   Severe 90% ostial  RCA disease which is calcified, will likely need PCI to ostial RCA with calcification  . CARDIAC CATHETERIZATION  09/18/2011   Calcified ostial 90-95% proximal RCA stenosis, 3x14mm Emerge balloon predilation, 3x66mm DES PROMUS Element stent was placed, post stent dilation done utilizing a 3.25x98mm noncompliant Quantum Apex balloon, 90-95% stenosis reduced to 0%  . CEA  2008  . CORONARY ANGIOPLASTY    . CORONARY ARTERY BYPASS GRAFT  08/04/2007  . FRACTIONAL FLOW RESERVE  WIRE  12/22/2011   Ostial RCA, FFR ratio-0.82, physiologically significant, 3.25x13mm Flextome Cutting balloon deployed at ~3.80mm final estimated diameter  . FRACTIONAL FLOW RESERVE WIRE Right 12/22/2011   Procedure: FRACTIONAL FLOW RESERVE WIRE;  Surgeon: Pixie Casino, MD;  Location: [facility] CATH LAB;  Service: Cardiovascular;  Laterality: Right;  . LEFT AND RIGHT HEART CATHETERIZATION WITH CORONARY ANGIOGRAM Left 09/16/2011   Procedure: LEFT AND RIGHT HEART CATHETERIZATION WITH CORONARY ANGIOGRAM;  Surgeon: Pixie Casino, MD;  Location: [facility] CATH LAB;  Service: Cardiovascular;  Laterality: Left;  . LEFT HEART CATHETERIZATION WITH CORONARY ANGIOGRAM N/A 06/10/2012   Procedure: LEFT HEART CATHETERIZATION WITH CORONARY ANGIOGRAM;  Surgeon: Pixie Casino, MD;  Location: [facility] CATH LAB;  Service: Cardiovascular;  Laterality: N/A;  . LEFT HEART CATHETERIZATION WITH CORONARY/GRAFT ANGIOGRAM N/A 12/22/2011   Procedure: LEFT HEART CATHETERIZATION WITH Beatrix Fetters;  Surgeon: Pixie Casino, MD;  Location: [facility] CATH LAB;  Service: Cardiovascular;  Laterality: N/A;  . PERCUTANEOUS CORONARY ROTOBLATOR INTERVENTION (PCI-R)  09/18/2011   Procedure: PERCUTANEOUS CORONARY ROTOBLATOR INTERVENTION (PCI-R);  Surgeon: Troy Sine, MD;  Location: [facility] CATH LAB;  Service: Cardiovascular;;  . PERCUTANEOUS CORONARY STENT INTERVENTION (PCI-S) N/A 09/18/2011   Procedure: PERCUTANEOUS CORONARY STENT INTERVENTION (PCI-S);  Surgeon: Troy Sine, MD;  Location: [facility] CATH LAB;  Service: Cardiovascular;  Laterality: N/A;  . TEMPORARY PACEMAKER INSERTION  09/18/2011   Procedure: TEMPORARY PACEMAKER INSERTION;  Surgeon: Troy Sine, MD;  Location: [facility] CATH LAB;  Service: Cardiovascular;;    Allergies  Allergen Reactions  . Atorvastatin Other (See Comments)    Muscle and joint pain, can tolerate rosuvastatin    Current Outpatient Prescriptions  Medication Sig Dispense Refill  . amLODipine (NORVASC) 10 MG tablet Take  1 tablet (10 mg total) by mouth at bedtime. 30 tablet 6  . aspirin EC 81 MG tablet Take 81 mg by mouth daily.    Marland Kitchen BRILINTA 90 MG TABS tablet TAKE 1 TABLET BY MOUTH TWICE A DAY 60 tablet 1  . clotrimazole-betamethasone (LOTRISONE) cream 3 (three) times daily as needed.    . fish oil-omega-3 fatty acids 1000 MG capsule Take 1 g by mouth 3 (three) times daily.    . isosorbide mononitrate (IMDUR) 60 MG 24 hr tablet TAKE 1 & 1/2 TABLETS BY MOUTH ONCE DAILY 45 tablet 2  . metoprolol tartrate (LOPRESSOR) 25 MG tablet Take 25 mg by mouth 2 (two) times daily.    . niacin (NIASPAN) 500 MG CR tablet Take 500 mg by mouth at bedtime.    . nitroGLYCERIN (NITROSTAT) 0.4 MG SL tablet Place 1 tablet (0.4 mg total) under the tongue every 5 (five) minutes x 3 doses as needed. 25 tablet 3  . rosuvastatin (CRESTOR) 40 MG tablet Take 1/3 tablet by mouth daily.    . ticagrelor (BRILINTA) 90 MG TABS tablet Take 1 tablet (90 mg total) by mouth 2 (two) times daily. 24 tablet 0   No current facility-administered medications for this visit.    Facility-Administered Medications Ordered in Other  Visits  Medication Dose Route Frequency Provider Last Rate Last Dose  . sodium chloride 0.9 % injection 3 mL  3 mL Intravenous PRN Pixie Casino, MD        Review of Systems : See HPI for pertinent positives and negatives.  Physical Examination  Vitals:   07/01/16 1430 07/01/16 1432  BP: 133/71 129/71  Pulse: (!) 59   Resp: 20   Temp: 97.4 F (36.3 C)   TempSrc: Oral   SpO2: 99%   Weight: 186 lb (84.4 kg)   Height: 5\' 9"  (1.753 m)    Body mass index is 27.47 kg/m.  General: WDWN male in NAD GAIT: normal Eyes: PERRLA Pulmonary: Respirations are non labored, CTAB, no rales, rhonchi, or wheezing.  Cardiac: regular rhythm,positive murmur.  VASCULAR EXAM Carotid Bruits Left Right   Transmitted cardiac murmur Transmitted cardiac murmur   Aorta is not palpable. Radial pulses are 2+ palpable and  equal.      LE Pulses LEFT RIGHT   POPLITEAL not palpable  not palpable   POSTERIOR TIBIAL  palpable   palpable    DORSALIS PEDIS   palpable  palpable     Gastrointestinal: soft, nontender, BS WNL, no r/g, no palpated masses.  Musculoskeletal: No muscle atrophy/wasting. M/S 5/5 throughout, Extremities without ischemic changes.  Neurologic: A&O X 3; Appropriate Affect, Speech is normal CN 2-12 intact except for mild right sided facial droop, Pain and light touch intact in extremities, Motor exam as listed above.    Assessment: [name] is a 82 y.o. male who is status post staged bilateral CEAs in September and December 2008. He had a preoperative TIA in September of 2008, none subsequently.  DATA  Today's carotid duplex suggests patent bilateral internal carotid arteries with history of carotid endarterectomy, no hemodynamically significant plaque present. Bilateral vertebral artery flow is antegrade. Bilateral subclavian artery waveforms are normal.  Essentially unchanged since previous study on 06-25-15.   Plan: Follow-up in 1 year with Carotid Duplex scan.   I discussed in depth with the patient the nature of atherosclerosis, and emphasized the importance of maximal medical management including strict control of blood pressure, blood glucose, and lipid levels, obtaining regular exercise, and continued cessation of smoking.  The patient is aware that without maximal medical management the underlying atherosclerotic disease process will progress, limiting the benefit of any interventions. The patient was given information about stroke prevention and what symptoms should prompt the patient to seek immediate medical care. Thank you for allowing [place] to participate in this patient's care.  Clemon Chambers, RN, MSN, FNP-C [facility] Office: [phone]  Clinic Physician: Scot Dock on call  07/01/16 2:35 PM

## 2016-07-01 NOTE — Patient Instructions (Signed)
Preventing Cerebrovascular Disease Arteries are blood vessels that carry blood that contains oxygen from the heart to all parts of the body. Cerebrovascular disease affects arteries that supply the brain. Any condition that blocks or disrupts blood flow to the brain can cause cerebrovascular disease. Brain cells that lose blood supply start to die within minutes (stroke). Stroke is the main danger of cerebrovascular disease. Atherosclerosis and high blood pressure are common causes of cerebrovascular disease. Atherosclerosis is narrowing and hardening of an artery that results when fat, cholesterol, calcium, or other substances (plaque) build up inside an artery. Plaque reduces blood flow through the artery. High blood pressure increases the risk of bleeding inside the brain. Making diet and lifestyle changes to prevent atherosclerosis and high blood pressure lowers your risk of cerebrovascular disease. What nutrition changes can be made?  Eat more fruits, vegetables, and whole grains.  Reduce how much saturated fat you eat. To do this, eat less red meat and fewer full-fat dairy products.  Eat healthy proteins instead of red meat. Healthy proteins include:  Fish. Eat fish that contains heart-healthy omega-3 fatty acids, twice a week. Examples include salmon, albacore tuna, mackerel, and herring.  Chicken.  Nuts.  Low-fat or nonfat yogurt.  Avoid processed meats, like bacon and lunchmeat.  Avoid foods that contain:  A lot of sugar, such as sweets and drinks with added sugar.  A lot of salt (sodium). Avoid adding extra salt to your food, as told by your health care provider.  Trans fats, such as margarine and baked goods. Trans fats may be listed as "partially hydrogenated oils" on food labels.  Check food labels to see how much sodium, sugar, and trans fats are in foods.  Use vegetable oils that contain low amounts of saturated fat, such as olive oil or canola oil. What lifestyle  changes can be made?  Drink alcohol in moderation. This means no more than 1 drink a day for nonpregnant women and 2 drinks a day for men. One drink equals 12 oz of beer, 5 oz of wine, or 1 oz of hard liquor.  If you are overweight, ask your health care provider to recommend a weight-loss plan for you. Losing 5-10 lb (2.2-4.5 kg) can reduce your risk of diabetes, atherosclerosis, and high blood pressure.  Exercise for 30?60 minutes on most days, or as much as told by your health care provider.  Do moderate-intensity exercise, such as brisk walking, bicycling, and water aerobics. Ask your health care provider which activities are safe for you.  Do not use any products that contain nicotine or tobacco, such as cigarettes and e-cigarettes. If you need help quitting, ask your health care provider. Why are these changes important? Making these changes lowers your risk of many diseases that can cause cerebrovascular disease and stroke. Stroke is a leading cause of death and disability. Making these changes also improves your overall health and quality of life. What can I do to lower my risk? The following factors make you more likely to develop cerebrovascular disease:  Being overweight.  Smoking.  Being physically inactive.  Eating a high-fat diet.  Having certain health conditions, such as:  Diabetes.  High blood pressure.  Heart disease.  Atherosclerosis.  High cholesterol.  Sickle cell disease. Talk with your health care provider about your risk for cerebrovascular disease. Work with your health care provider to control diseases that you have that may contribute to cerebrovascular disease. Your health care provider may prescribe medicines to help prevent major   causes of cerebrovascular disease. Where to find more information: Learn more about preventing cerebrovascular disease from:  National Heart, Lung, and Blood Institute:  www.nhlbi.nih.gov/health/health-topics/topics/stroke  Centers for Disease Control and Prevention: cdc.gov/stroke/about.htm Summary  Cerebrovascular disease can lead to a stroke.  Atherosclerosis and high blood pressure are major causes of cerebrovascular disease.  Making diet and lifestyle changes can reduce your risk of cerebrovascular disease.  Work with your health care provider to get your risk factors under control to reduce your risk of cerebrovascular disease. This information is not intended to replace advice given to you by your health care provider. Make sure you discuss any questions you have with your health care provider. Document Released: 08/19/2015 Document Revised: 02/22/2016 Document Reviewed: 08/19/2015 Elsevier Interactive Patient Education  2017 [employer].  

## 2016-07-29 ENCOUNTER — Telehealth: Payer: Self-pay | Admitting: Internal Medicine

## 2016-07-29 NOTE — Telephone Encounter (Signed)
Patient would like samples of Brilinta 90mg 

## 2016-09-09 ENCOUNTER — Telehealth: Payer: Self-pay | Admitting: Internal Medicine

## 2016-09-09 NOTE — Telephone Encounter (Signed)
Patient calling the office for samples of medication:   1.  What medication and dosage are you requesting samples for? Brilinta  2.  Are you currently out of this medication? No,just 2 days left

## 2016-09-09 NOTE — Telephone Encounter (Signed)
Returned call to [name] 90 mg samples left a [facility] office front desk.

## 2016-10-13 ENCOUNTER — Encounter: Payer: Self-pay | Admitting: Internal Medicine

## 2016-10-13 ENCOUNTER — Ambulatory Visit (INDEPENDENT_AMBULATORY_CARE_PROVIDER_SITE_OTHER): Payer: Medicare Other | Admitting: Internal Medicine

## 2016-10-13 VITALS — BP 130/72 | HR 61 | Ht 70.0 in | Wt 179.0 lb

## 2016-10-13 DIAGNOSIS — I10 Essential (primary) hypertension: Secondary | ICD-10-CM | POA: Diagnosis not present

## 2016-10-13 DIAGNOSIS — I350 Nonrheumatic aortic (valve) stenosis: Secondary | ICD-10-CM | POA: Diagnosis not present

## 2016-10-13 DIAGNOSIS — E782 Mixed hyperlipidemia: Secondary | ICD-10-CM | POA: Diagnosis not present

## 2016-10-13 DIAGNOSIS — I447 Left bundle-branch block, unspecified: Secondary | ICD-10-CM | POA: Diagnosis not present

## 2016-10-13 DIAGNOSIS — I498 Other specified cardiac arrhythmias: Secondary | ICD-10-CM | POA: Diagnosis not present

## 2016-10-13 MED ORDER — CLOPIDOGREL BISULFATE 75 MG PO TABS: 75.0000 mg | ORAL_TABLET | Freq: Every day | ORAL | 3 refills | Status: DC

## 2016-10-13 NOTE — Progress Notes (Signed)
OFFICE NOTE  Chief Complaint:  No complaints  Primary Care Physician: [name], MD  HPI:  [name] is a 83 year old gentleman who had unfortunate aggressive restenosis of an RCA lesion in the past, recently restented. He presented with worsening anginal symptoms and medical therapy was recommended in November. He was scheduled for a nuclear stress test which he underwent on July 21, 2012, and this was negative for ischemia. Recently, he had been experiencing some soreness in his left chest wall which has been going on and off for about a month. He is not experiencing the shortness of breath that he previously experienced with his angina, especially when walking up hills. He noted that he had an episode of a fall when he was trying to lift a very heavy deer, landed on his back and perhaps strained or sprained intercostal muscles or something in the chest wall. Based on his symptoms he underwent stress testing which indicated ischemia and he underwent a repeat coronary intervention to the RCA.   At his last office visit he was doing fairly well, without worsening shortness of breath or chest discomfort.  [place] but noted a small increase in shortness of breath with activity. He's also had weight gain recently and some lower extremity swelling. He says his symptoms are somewhat reminiscent of the symptoms that required prior coronary intervention about one year ago, but not as significant. Of note, he does have moderate to severe aortic stenosis which were touching very closely and a history of diastolic dysfunction plus or minus heart failure.  [name] and is feeling quite well. He denies any chest pain or shortness of breath. He told me of an episode recently where he helped deliver car and had to walk back home. Walked about 2 half miles during the middle of the day in the heat and had no problems or shortness of  breath or chest discomfort. He actually is feeling the best he has in some time.  [name] back in the office today. He continues to be active. He's been hunting recently and doing a lot of exertion without any worsening shortness of breath or chest pain. He recently had problems with some pain in his legs which he attributed to combination of Crestor and that he had. He discontinued his area and wean down his Crestor. He is currently getting 40 mg tablets and breaking them into thirds. He's taking one third of a 40 mg tablet are roughly 13 mg daily which she says is tolerable. His most recent lipid profile from September 2015 shows a total cholesterol 118, temperature 31, HDL 59 and LDL of 43 which is very low. He should be able to tolerate the decrease in his Crestor and still be at target numbers. He did have a lipoma profile and MR in March 2015. I believe there was an air in triglycerides which read over 700, but LDL-C was less than 100, LDL-P was 700 and generally showed a very favorable profile.  [name] in the office for follow-up. Overall he reports doing fairly well. He continues to be active during the summer denies any chest pain or worsening shortness of breath. He has follow-up of his carotid endarterectomies in November. We are following him for aortic stenosis and he does not seem to be symptomatic with that.  [name] back in the office today for follow-up.  He reports that he has developed again some exertional chest pain over the past few weeks. He feels like a soreness in the left chest wall, but it is also deep inside. Can come at rest, but worse with exertion. There is some exertional dyspnea as well. He has moderate AS by his most recent echo in 03/2015.   [name]. He underwent nuclear stress testing which was negative for ischemia. EF was normal at 58%. He reports his chest discomfort is gone away. He is now getting over a chest cold.  I suspect some of his pain was musculoskeletal.  04/14/2016  [name] was seen back today in the office for follow-up. He reports that he is doing very well denies any shortness of breath or chest pain. He is very physically active. In February he underwent nuclear stress testing which was negative for ischemia. We are following moderate aortic stenosis which was seen on his echo last year. He does have stage III chronic kidney disease as well. He's followed by vein and vascular surgery for bilateral carotid endarterectomies. Blood pressure is well-controlled today. Recent lipid profile was within normal limits and is followed by Dr. [name].  10/13/2016  [name] was seen again in follow-up today. He reports feeling "scaringly well". He says he has not felt this well on long time. 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 He denies any chest pain or worsening shortness of breath. He did have an echocardiogram which showed moderate aortic stenosis. Continue to follow that as it is likely to progress despite adequate control of blood pressure and cholesterol. He also has carotid artery disease and is followed by vein and vascular surgery.  PMHx:  Past Medical History:  Diagnosis Date  . Abnormal cardiovascular stress test, 11/2011 wint inferolateral reveribility 12/22/2011  . Angina effort, 12/2011 with DOE anginal equivlent 12/22/2011  . Blood transfusion    "  no reaction to transfusion "  . CAD (coronary artery disease), CABG 1996. 09/23/2011   2D Echo - EF >55%, moderate calcification of the aortic valve leaflets  . Carotid artery disease, Rt. CEA, 08/04/07, Lt. CEA 04/26/07. 12/23/2011  . Chronic kidney disease   . CKD (chronic kidney disease) stage 2, GFR 60-89 ml/min 09/16/2011  . Coronary artery disease   . Crescendo angina ([place]) 06/09/2012  . CVA (cerebral vascular accident) ([place]) 04/22/2007   2D Echo - EF 50-55%,  aortic valve thickness mild-moderately increased,  . Dyspnea on exertion, anginal equivilant 10/16/2010   2D Echo - EF >55%, mild-moderate mitral regurgiation, mild-moderate tricuspid regurgitation, moderate calcification of the aortic leaflets  . Dysrhythmia    LLB  anf leaking valve per patient  . HTN (hypertension),uncontrolled 09/16/2011  . Hypertension   . Peripheral arterial disease ([place])   . Pulmonary hypertension, mild 09/17/2011  . Recurrent angina status post rotational atherectomy, with PTCA for ISR to RCA, 12/22/2011 12/22/2011  . Shortness of breath   . Stroke ([place])    hx of TIA    Past Surgical History:  Procedure Laterality Date  .  Fluid knee Right Sept. 6, 2014   Fluid removed  . CARDIAC CATHETERIZATION Left 06/10/2012   Ostial RCA 99% stenosis pre-dilation 3x16mm Angiosculpt PTCA balloon, 3.5x3mm Promus Element DES overlapping proximal stent and into the Aorto-ostium, post-dilated with4x53mm Chauvin Trek balloon - 99% stenosis reduced to 0%  . CARDIAC CATHETERIZATION Left 12/22/2011  Left Main-diffuse tapering 50-60% stenosis; mild-moderate diseaseof the right ventricle branch of the right coronary artery; will plan FFR of ostial RCA  . CARDIAC CATHETERIZATION Bilateral 09/16/2011   Severe 90% ostial RCA disease which is calcified, will likely need PCI to ostial RCA with calcification  . CARDIAC CATHETERIZATION  09/18/2011   Calcified ostial 90-95% proximal RCA stenosis, 3x23mm Emerge balloon predilation, 3x82mm DES PROMUS Element stent was placed, post stent dilation done utilizing a 3.25x32mm noncompliant Quantum Apex balloon, 90-95% stenosis reduced to 0%  . CEA  2008  . CORONARY ANGIOPLASTY    . CORONARY ARTERY BYPASS GRAFT  08/04/2007  . FRACTIONAL FLOW RESERVE WIRE  12/22/2011   Ostial RCA, FFR ratio-0.82, physiologically significant, 3.25x56mm Flextome Cutting balloon deployed at ~3.1mm final estimated diameter  . FRACTIONAL FLOW RESERVE WIRE Right 12/22/2011   Procedure:  FRACTIONAL FLOW RESERVE WIRE;  Surgeon: Pixie Casino, MD;  Location: [facility] CATH LAB;  Service: Cardiovascular;  Laterality: Right;  . LEFT AND RIGHT HEART CATHETERIZATION WITH CORONARY ANGIOGRAM Left 09/16/2011   Procedure: LEFT AND RIGHT HEART CATHETERIZATION WITH CORONARY ANGIOGRAM;  Surgeon: Pixie Casino, MD;  Location: [facility] CATH LAB;  Service: Cardiovascular;  Laterality: Left;  . LEFT HEART CATHETERIZATION WITH CORONARY ANGIOGRAM N/A 06/10/2012   Procedure: LEFT HEART CATHETERIZATION WITH CORONARY ANGIOGRAM;  Surgeon: Pixie Casino, MD;  Location: [facility] CATH LAB;  Service: Cardiovascular;  Laterality: N/A;  . LEFT HEART CATHETERIZATION WITH CORONARY/GRAFT ANGIOGRAM N/A 12/22/2011   Procedure: LEFT HEART CATHETERIZATION WITH Beatrix Fetters;  Surgeon: Pixie Casino, MD;  Location: [facility] CATH LAB;  Service: Cardiovascular;  Laterality: N/A;  . PERCUTANEOUS CORONARY ROTOBLATOR INTERVENTION (PCI-R)  09/18/2011   Procedure: PERCUTANEOUS CORONARY ROTOBLATOR INTERVENTION (PCI-R);  Surgeon: Troy Sine, MD;  Location: [facility] CATH LAB;  Service: Cardiovascular;;  . PERCUTANEOUS CORONARY STENT INTERVENTION (PCI-S) N/A 09/18/2011   Procedure: PERCUTANEOUS CORONARY STENT INTERVENTION (PCI-S);  Surgeon: Troy Sine, MD;  Location: [facility] CATH LAB;  Service: Cardiovascular;  Laterality: N/A;  . TEMPORARY PACEMAKER INSERTION  09/18/2011   Procedure: TEMPORARY PACEMAKER INSERTION;  Surgeon: Troy Sine, MD;  Location: [facility] CATH LAB;  Service: Cardiovascular;;    FAMHx:  Family History  Problem Relation Age of Onset  . Cancer Mother   . Heart disease Mother   . Stroke Mother   . Alcohol abuse Father   . Depression Father   . Alzheimer's disease Father   . Heart disease Brother     Heart bypass in 1996  . Cancer Brother     Prostate  . Cancer Sister     SOCHx:   reports that he has never smoked. He has never used smokeless tobacco. He reports that he does not drink alcohol or use  drugs.  ALLERGIES:  Allergies  Allergen Reactions  . Atorvastatin Other (See Comments)    Muscle and joint pain, can tolerate rosuvastatin    ROS: Pertinent items noted in HPI and remainder of comprehensive ROS otherwise negative.  HOME MEDS: Current Outpatient Prescriptions  Medication Sig Dispense Refill  . amLODipine (NORVASC) 10 MG tablet Take 1 tablet (10 mg total) by mouth at bedtime. 30 tablet 6  . aspirin EC 81 MG tablet Take 81 mg by mouth daily.    . fish oil-omega-3 fatty acids 1000 MG capsule Take 1 g by mouth 3 (three) times daily.    . isosorbide mononitrate (IMDUR) 60 MG 24 hr tablet TAKE 1 & 1/2 TABLETS BY MOUTH ONCE DAILY 45 tablet 2  .  metoprolol tartrate (LOPRESSOR) 25 MG tablet Take 25 mg by mouth 2 (two) times daily.    . niacin (NIASPAN) 500 MG CR tablet Take 500 mg by mouth at bedtime.    . nitroGLYCERIN (NITROSTAT) 0.4 MG SL tablet Place 1 tablet (0.4 mg total) under the tongue every 5 (five) minutes x 3 doses as needed. 25 tablet 3  . rosuvastatin (CRESTOR) 40 MG tablet Take 1/3 tablet by mouth daily.    . clopidogrel (PLAVIX) 75 MG tablet Take 1 tablet (75 mg total) by mouth daily. 90 tablet 3   No current facility-administered medications for this visit.    Facility-Administered Medications Ordered in Other Visits  Medication Dose Route Frequency Provider Last Rate Last Dose  . sodium chloride 0.9 % injection 3 mL  3 mL Intravenous PRN Pixie Casino, MD        LABS/IMAGING: No results found for this or any previous visit (from the past 48 hour(s)). No results found.  VITALS: BP 130/72   Pulse 61   Ht 5\' 10"  (1.778 m)   Wt 179 lb (81.2 kg)   BMI 25.68 kg/m   EXAM: General appearance: alert and no distress Neck: no carotid bruit and no JVD Lungs: clear to auscultation bilaterally Heart: regular rate and rhythm, S1, S2 normal and systolic murmur: systolic ejection 3/6, crescendo at 2nd right intercostal space Abdomen: soft, non-tender; bowel  sounds normal; no masses,  no organomegaly Extremities: extremities normal, atraumatic, no cyanosis or edema Pulses: 2+ and symmetric Skin: Skin color, texture, turgor normal. No rashes or lesions Neurologic: Grossly normal Psych: Pleasant  EKG: Sinus rhythm at 61, fusion beats, LBBB  ASSESSMENT: 1. Coronary artery disease with multiple recent PCI to the right coronary - low risk NST- EF 58% (2017) 2. Frequent fusion beats-asymptomatic 3. History of bilateral carotid endarterectomies 4. Hypertension 5. Dyslipidemia 6. Left bundle branch block 7. Stage III chronic kidney disease 8. Moderate aortic stenosis  PLAN: 1.   Mr. Timson is doing quite well and is asymptomatic. He's had no recurrent anginal symptoms. He is followed by [facility] surgery for carotid disease. Blood pressure is at goal. His cholesterol is also at goal. He has a left bundle branch block which is stable and is noted to have fusion beats today but is asymptomatic with regards to this. He does have moderate aortic stenosis and will be due for recurrent echocardiograms to assess the stability of this. We again discussed today that there is not an ongoing indication for Brilinta at this point considering it's a very potent antiplatelet. I do agree that long-term dual antiplatelet therapy is reasonable given multiple interventions to the right coronary artery. He is agreeable to switch to Plavix 75 mg daily in addition to aspirin. Onto plating upcoming cataract surgery which should be low risk and would be able to stop the Plavix 5 days prior to that and aspirin 7 days prior to if necessary.  Follow-up with me in 6 months.  Pixie Casino, MD, [facility] Attending Cardiologist [place] Kadarrius Yanke 10/13/2016, 11:00 AM

## 2016-10-13 NOTE — Patient Instructions (Addendum)
Your physician has recommended you make the following change in your medication:  -- STOP Brilinta -- START plavix 75mg  daily  Your physician has requested that you have an echocardiogram in 6 months @ [address]rd [address]. Echocardiography is a painless test that uses sound waves to create images of your heart. It provides your doctor with information about the size and shape of your heart and how well your heart's chambers and valves are working. This procedure takes approximately one hour. There are no restrictions for this procedure.  Your physician wants you to follow-up in: 6 months with Dr. Debara Pickett - after echo. You will receive a reminder letter in the mail two months in advance. If you don't receive a letter, please call our office to schedule the follow-up appointment.

## 2016-12-18 ENCOUNTER — Ambulatory Visit (INDEPENDENT_AMBULATORY_CARE_PROVIDER_SITE_OTHER): Payer: Medicare Other | Admitting: Ophthalmology

## 2016-12-18 DIAGNOSIS — H348312 Tributary (branch) retinal vein occlusion, right eye, stable: Secondary | ICD-10-CM

## 2016-12-18 DIAGNOSIS — I10 Essential (primary) hypertension: Secondary | ICD-10-CM

## 2016-12-18 DIAGNOSIS — H35033 Hypertensive retinopathy, bilateral: Secondary | ICD-10-CM | POA: Diagnosis not present

## 2016-12-18 DIAGNOSIS — H43813 Vitreous degeneration, bilateral: Secondary | ICD-10-CM | POA: Diagnosis not present

## 2016-12-18 DIAGNOSIS — H2513 Age-related nuclear cataract, bilateral: Secondary | ICD-10-CM

## 2017-01-01 ENCOUNTER — Ambulatory Visit (INDEPENDENT_AMBULATORY_CARE_PROVIDER_SITE_OTHER): Payer: Medicare Other | Admitting: Ophthalmology

## 2017-03-23 ENCOUNTER — Encounter (INDEPENDENT_AMBULATORY_CARE_PROVIDER_SITE_OTHER): Payer: Medicare Other | Admitting: Ophthalmology

## 2017-03-23 DIAGNOSIS — I10 Essential (primary) hypertension: Secondary | ICD-10-CM | POA: Diagnosis not present

## 2017-03-23 DIAGNOSIS — H34831 Tributary (branch) retinal vein occlusion, right eye, with macular edema: Secondary | ICD-10-CM | POA: Diagnosis not present

## 2017-03-23 DIAGNOSIS — H35033 Hypertensive retinopathy, bilateral: Secondary | ICD-10-CM

## 2017-03-23 DIAGNOSIS — H2512 Age-related nuclear cataract, left eye: Secondary | ICD-10-CM | POA: Diagnosis not present

## 2017-04-14 ENCOUNTER — Ambulatory Visit: Payer: Medicare Other | Admitting: Internal Medicine

## 2017-04-16 ENCOUNTER — Ambulatory Visit (INDEPENDENT_AMBULATORY_CARE_PROVIDER_SITE_OTHER): Payer: Medicare Other | Admitting: Internal Medicine

## 2017-04-16 ENCOUNTER — Encounter: Payer: Self-pay | Admitting: Internal Medicine

## 2017-04-16 VITALS — BP 124/68 | HR 65 | Ht 69.5 in | Wt 177.6 lb

## 2017-04-16 DIAGNOSIS — I350 Nonrheumatic aortic (valve) stenosis: Secondary | ICD-10-CM | POA: Diagnosis not present

## 2017-04-16 DIAGNOSIS — I10 Essential (primary) hypertension: Secondary | ICD-10-CM

## 2017-04-16 DIAGNOSIS — I447 Left bundle-branch block, unspecified: Secondary | ICD-10-CM

## 2017-04-16 DIAGNOSIS — Z87898 Personal history of other specified conditions: Secondary | ICD-10-CM | POA: Diagnosis not present

## 2017-04-16 MED ORDER — NITROGLYCERIN 0.4 MG SL SUBL: 0.4000 mg | SUBLINGUAL_TABLET | SUBLINGUAL | 3 refills | Status: AC | PRN

## 2017-04-16 NOTE — Patient Instructions (Signed)
Your physician wants you to follow-up in: 6 months with Dr. [name]. You will receive a reminder letter in the mail two months in advance. If you don't receive a letter, please call our office to schedule the follow-up appointment.    

## 2017-04-17 DIAGNOSIS — R0789 Other chest pain: Secondary | ICD-10-CM | POA: Insufficient documentation

## 2017-04-17 NOTE — Progress Notes (Signed)
OFFICE NOTE  Chief Complaint:  No complaints  Primary Care Physician: Deland Pretty, MD  HPI:  [name] is a 83 year old gentleman who had unfortunate aggressive restenosis of an RCA lesion in the past, recently restented. He presented with worsening anginal symptoms and medical therapy was recommended in November. He was scheduled for a nuclear stress test which he underwent on July 21, 2012, and this was negative for ischemia. Recently, he had been experiencing some soreness in his left chest wall which has been going on and off for about a month. He is not experiencing the shortness of breath that he previously experienced with his angina, especially when walking up hills. He noted that he had an episode of a fall when he was trying to lift a very heavy deer, landed on his back and perhaps strained or sprained intercostal muscles or something in the chest wall. Based on his symptoms he underwent stress testing which indicated ischemia and he underwent a repeat coronary intervention to the RCA.   At his last office visit he was doing fairly well, without worsening shortness of breath or chest discomfort.  She did make it [place] this [place] but noted a small increase in shortness of breath with activity. He's also had weight gain recently and some lower extremity swelling. He says his symptoms are somewhat reminiscent of the symptoms that required prior coronary intervention about one year ago, but not as significant. Of note, he does have moderate to severe aortic stenosis which were touching very closely and a history of diastolic dysfunction plus or minus heart failure.  [name] and is feeling quite well. He denies any chest pain or shortness of breath. He told me of an episode recently where he helped deliver car and had to walk back home. Walked about 2 half miles during the middle of the day in the heat and had no problems or shortness of breath or  chest discomfort. He actually is feeling the best he has in some time.  [name] back in the office today. He continues to be active. He's been hunting recently and doing a lot of exertion without any worsening shortness of breath or chest pain. He recently had problems with some pain in his legs which he attributed to combination of Crestor and that he had. He discontinued his area and wean down his Crestor. He is currently getting 40 mg tablets and breaking them into thirds. He's taking one third of a 40 mg tablet are roughly 13 mg daily which she says is tolerable. His most recent lipid profile from September 2015 shows a total cholesterol 118, temperature 31, HDL 59 and LDL of 43 which is very low. He should be able to tolerate the decrease in his Crestor and still be at target numbers. He did have a lipoma profile and MR in March 2015. I believe there was an air in triglycerides which read over 700, but LDL-C was less than 100, LDL-P was 700 and generally showed a very favorable profile.  [name] in the office for follow-up. Overall he reports doing fairly well. He continues to be active during the summer denies any chest pain or worsening shortness of breath. He has follow-up of his carotid endarterectomies in November. We are following him for aortic stenosis and he does not seem to be symptomatic with that.  [name] back in the office today for follow-up.  He reports that he has developed again some exertional chest pain over the past few weeks. He feels like a soreness in the left chest wall, but it is also deep inside. Can come at rest, but worse with exertion. There is some exertional dyspnea as well. He has moderate AS by his most recent echo in 03/2015.   [name]. He underwent nuclear stress testing which was negative for ischemia. EF was normal at 58%. He reports his chest discomfort is gone away. He is now getting over a chest cold. I suspect  some of his pain was musculoskeletal.  04/14/2016  [name] was seen back today in the office for follow-up. He reports that he is doing very well denies any shortness of breath or chest pain. He is very physically active. In February he underwent nuclear stress testing which was negative for ischemia. We are following moderate aortic stenosis which was seen on his echo last year. He does have stage III chronic kidney disease as well. He's followed by vein and vascular surgery for bilateral carotid endarterectomies. Blood pressure is well-controlled today. Recent lipid profile was within normal limits and is followed by Dr. [name].  10/13/2016  [name] was seen again in follow-up today. He reports feeling "scaringly well". He says he has not felt this well on long time. 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 He denies any chest pain or worsening shortness of breath. He did have an echocardiogram which showed moderate aortic stenosis. Continue to follow that as it is likely to progress despite adequate control of blood pressure and cholesterol. He also has carotid artery disease and is followed by vein and vascular surgery.  04/16/2017  [name]. He denies any new chest pain or worsening shortness of breath. His blood pressures been well controlled. Where continue to follow his aortic stenosis and he has an upcoming echo in September. He recently changed primary care providers to Dr. [name], since Dr. [name] has retired. He denies any anginal chest pain.  PMHx:  Past Medical History:  Diagnosis Date  . Abnormal cardiovascular stress test, 11/2011 wint inferolateral reveribility 12/22/2011  . Angina effort, 12/2011 with DOE anginal equivlent 12/22/2011  . Blood transfusion    "  no reaction to transfusion "  . CAD (coronary artery disease), CABG 1996. 09/23/2011   2D Echo - EF >55%, moderate  calcification of the aortic valve leaflets  . Carotid artery disease, Rt. CEA, 08/04/07, Lt. CEA 04/26/07. 12/23/2011  . Chronic kidney disease   . CKD (chronic kidney disease) stage 2, GFR 60-89 ml/min 09/16/2011  . Coronary artery disease   . Crescendo angina ([place]) 06/09/2012  . CVA (cerebral vascular accident) ([place]) 04/22/2007   2D Echo - EF 50-55%, aortic valve thickness mild-moderately increased,  . Dyspnea on exertion, anginal equivilant 10/16/2010   2D Echo - EF >55%, mild-moderate mitral regurgiation, mild-moderate tricuspid regurgitation, moderate calcification of the aortic leaflets  . Dysrhythmia    LLB  anf leaking valve per patient  . HTN (hypertension),uncontrolled 09/16/2011  . Hypertension   . Peripheral arterial disease ([place])   . Pulmonary hypertension, mild 09/17/2011  . Recurrent angina status post rotational atherectomy, with PTCA for ISR to RCA, 12/22/2011 12/22/2011  . Shortness of breath   . Stroke ([place])    hx of TIA    Past Surgical History:  Procedure Laterality Date  .  Fluid knee Right Sept. 6, 2014   Fluid removed  . CARDIAC CATHETERIZATION Left 06/10/2012   Ostial RCA 99% stenosis pre-dilation 3x20mm Angiosculpt PTCA balloon, 3.5x40mm Promus Element DES overlapping proximal stent and into the Aorto-ostium, post-dilated with4x60mm Thornton Trek balloon - 99% stenosis reduced to 0%  . CARDIAC CATHETERIZATION Left 12/22/2011   Left Main-diffuse tapering 50-60% stenosis; mild-moderate diseaseof the right ventricle branch of the right coronary artery; will plan FFR of ostial RCA  . CARDIAC CATHETERIZATION Bilateral 09/16/2011   Severe 90% ostial RCA disease which is calcified, will likely need PCI to ostial RCA with calcification  . CARDIAC CATHETERIZATION  09/18/2011   Calcified ostial 90-95% proximal RCA stenosis, 3x54mm Emerge balloon predilation, 3x12mm DES PROMUS Element stent was placed, post stent dilation done utilizing a 3.25x41mm noncompliant Quantum Apex balloon, 90-95%  stenosis reduced to 0%  . CEA  2008  . CORONARY ANGIOPLASTY    . CORONARY ARTERY BYPASS GRAFT  08/04/2007  . FRACTIONAL FLOW RESERVE WIRE  12/22/2011   Ostial RCA, FFR ratio-0.82, physiologically significant, 3.25x13mm Flextome Cutting balloon deployed at ~3.67mm final estimated diameter  . FRACTIONAL FLOW RESERVE WIRE Right 12/22/2011   Procedure: FRACTIONAL FLOW RESERVE WIRE;  Surgeon: Pixie Casino, MD;  Location: [facility] CATH LAB;  Service: Cardiovascular;  Laterality: Right;  . LEFT AND RIGHT HEART CATHETERIZATION WITH CORONARY ANGIOGRAM Left 09/16/2011   Procedure: LEFT AND RIGHT HEART CATHETERIZATION WITH CORONARY ANGIOGRAM;  Surgeon: Pixie Casino, MD;  Location: [facility] CATH LAB;  Service: Cardiovascular;  Laterality: Left;  . LEFT HEART CATHETERIZATION WITH CORONARY ANGIOGRAM N/A 06/10/2012   Procedure: LEFT HEART CATHETERIZATION WITH CORONARY ANGIOGRAM;  Surgeon: Pixie Casino, MD;  Location: [facility] CATH LAB;  Service: Cardiovascular;  Laterality: N/A;  . LEFT HEART CATHETERIZATION WITH CORONARY/GRAFT ANGIOGRAM N/A 12/22/2011   Procedure: LEFT HEART CATHETERIZATION WITH Beatrix Fetters;  Surgeon: Pixie Casino, MD;  Location: [facility] CATH LAB;  Service: Cardiovascular;  Laterality: N/A;  . PERCUTANEOUS CORONARY ROTOBLATOR INTERVENTION (PCI-R)  09/18/2011   Procedure: PERCUTANEOUS CORONARY ROTOBLATOR INTERVENTION (PCI-R);  Surgeon: Troy Sine, MD;  Location: [facility] CATH LAB;  Service: Cardiovascular;;  . PERCUTANEOUS CORONARY STENT INTERVENTION (PCI-S) N/A 09/18/2011   Procedure: PERCUTANEOUS CORONARY STENT INTERVENTION (PCI-S);  Surgeon: Troy Sine, MD;  Location: [facility] CATH LAB;  Service: Cardiovascular;  Laterality: N/A;  . TEMPORARY PACEMAKER INSERTION  09/18/2011   Procedure: TEMPORARY PACEMAKER INSERTION;  Surgeon: Troy Sine, MD;  Location: [facility] CATH LAB;  Service: Cardiovascular;;    FAMHx:  Family History  Problem Relation Age of Onset  . Cancer Mother   . Heart disease Mother    . Stroke Mother   . Alcohol abuse Father   . Depression Father   . Alzheimer's disease Father   . Heart disease Brother        Heart bypass in 1996  . Cancer Brother        Prostate  . Cancer Sister     SOCHx:   reports that he has never smoked. He has never used smokeless tobacco. He reports that he does not drink alcohol or use drugs.  ALLERGIES:  Allergies  Allergen Reactions  . Atorvastatin Other (See Comments)    Muscle and joint pain, can tolerate rosuvastatin    ROS: Pertinent items noted in HPI and remainder of comprehensive ROS otherwise negative.  HOME MEDS: Current Outpatient Prescriptions  Medication Sig Dispense Refill  . amLODipine (NORVASC) 10 MG tablet Take  1 tablet (10 mg total) by mouth at bedtime. 30 tablet 6  . aspirin EC 81 MG tablet Take 81 mg by mouth daily.    . clopidogrel (PLAVIX) 75 MG tablet Take 1 tablet (75 mg total) by mouth daily. 90 tablet 3  . fish oil-omega-3 fatty acids 1000 MG capsule Take 1 g by mouth 3 (three) times daily.    . isosorbide mononitrate (IMDUR) 60 MG 24 hr tablet TAKE 1 & 1/2 TABLETS BY MOUTH ONCE DAILY 45 tablet 2  . metoprolol tartrate (LOPRESSOR) 25 MG tablet Take 25 mg by mouth 2 (two) times daily.    . niacin (NIASPAN) 500 MG CR tablet Take 500 mg by mouth at bedtime.    . nitroGLYCERIN (NITROSTAT) 0.4 MG SL tablet Place 1 tablet (0.4 mg total) under the tongue every 5 (five) minutes x 3 doses as needed. 25 tablet 3  . rosuvastatin (CRESTOR) 40 MG tablet Take 1/3 tablet by mouth daily.     No current facility-administered medications for this visit.    Facility-Administered Medications Ordered in Other Visits  Medication Dose Route Frequency Provider Last Rate Last Dose  . sodium chloride 0.9 % injection 3 mL  3 mL Intravenous PRN Maylani Embree, Nadean Corwin, MD        LABS/IMAGING: No results found for this or any previous visit (from the past 48 hour(s)). No results found.  VITALS: BP 124/68   Pulse 65   Ht 5' 9.5"  (1.765 m)   Wt 177 lb 9.6 oz (80.6 kg)   BMI 25.85 kg/m   EXAM: General appearance: alert and no distress Neck: no carotid bruit and no JVD Lungs: clear to auscultation bilaterally Heart: regular rate and rhythm, S1, S2 normal and systolic murmur: systolic ejection 3/6, crescendo at 2nd right intercostal space Abdomen: soft, non-tender; bowel sounds normal; no masses,  no organomegaly Extremities: extremities normal, atraumatic, no cyanosis or edema Pulses: 2+ and symmetric Skin: Skin color, texture, turgor normal. No rashes or lesions Neurologic: Grossly normal Psych: Pleasant  EKG: Sinus rhythm with PVCs at 65, LBBB-personally reviewed  ASSESSMENT: 1. Coronary artery disease with multiple recent PCI to the right coronary - low risk NST- EF 58% (2017) 2. Frequent fusion beats-asymptomatic 3. History of bilateral carotid endarterectomies 4. Hypertension 5. Dyslipidemia 6. LBBB 7. Stage III chronic kidney disease 8. Moderate aortic stenosis  PLAN: 1.   Mr. Salls seems to be doing well without new coronary artery disease symptoms. He has a repeat echocardiogram next month. He has moderate aortic stenosis which were following closely. He has a stable left bundle branch block. He is on aspirin and Plavix and blood pressure is well-controlled. His cholesterol is at goal on simvastatin, niacin and fish oil. I'll provide him with a refill of his nitroglycerin although he is not required any in the last year.  Follow-up with me in 6 months.  Pixie Casino, MD, [facility] Attending Cardiologist [place] 04/17/2017, 7:00 PM

## 2017-05-05 ENCOUNTER — Encounter (INDEPENDENT_AMBULATORY_CARE_PROVIDER_SITE_OTHER): Payer: Medicare Other | Admitting: Ophthalmology

## 2017-05-05 DIAGNOSIS — H35033 Hypertensive retinopathy, bilateral: Secondary | ICD-10-CM | POA: Diagnosis not present

## 2017-05-05 DIAGNOSIS — H43813 Vitreous degeneration, bilateral: Secondary | ICD-10-CM | POA: Diagnosis not present

## 2017-05-05 DIAGNOSIS — I10 Essential (primary) hypertension: Secondary | ICD-10-CM | POA: Diagnosis not present

## 2017-05-05 DIAGNOSIS — H34831 Tributary (branch) retinal vein occlusion, right eye, with macular edema: Secondary | ICD-10-CM | POA: Diagnosis not present

## 2017-05-07 ENCOUNTER — Other Ambulatory Visit: Payer: Self-pay

## 2017-05-07 ENCOUNTER — Ambulatory Visit (HOSPITAL_COMMUNITY): Payer: Medicare Other | Attending: Internal Medicine

## 2017-05-07 DIAGNOSIS — R079 Chest pain, unspecified: Secondary | ICD-10-CM | POA: Diagnosis not present

## 2017-05-07 DIAGNOSIS — R0600 Dyspnea, unspecified: Secondary | ICD-10-CM | POA: Insufficient documentation

## 2017-05-07 DIAGNOSIS — Z8673 Personal history of transient ischemic attack (TIA), and cerebral infarction without residual deficits: Secondary | ICD-10-CM | POA: Diagnosis not present

## 2017-05-07 DIAGNOSIS — I083 Combined rheumatic disorders of mitral, aortic and tricuspid valves: Secondary | ICD-10-CM | POA: Diagnosis not present

## 2017-05-07 DIAGNOSIS — I350 Nonrheumatic aortic (valve) stenosis: Secondary | ICD-10-CM | POA: Diagnosis present

## 2017-05-07 DIAGNOSIS — N189 Chronic kidney disease, unspecified: Secondary | ICD-10-CM | POA: Insufficient documentation

## 2017-05-07 DIAGNOSIS — I1310 Hypertensive heart and chronic kidney disease without heart failure, with stage 1 through stage 4 chronic kidney disease, or unspecified chronic kidney disease: Secondary | ICD-10-CM | POA: Diagnosis not present

## 2017-06-02 ENCOUNTER — Encounter (INDEPENDENT_AMBULATORY_CARE_PROVIDER_SITE_OTHER): Payer: Medicare Other | Admitting: Ophthalmology

## 2017-06-02 DIAGNOSIS — H35033 Hypertensive retinopathy, bilateral: Secondary | ICD-10-CM | POA: Diagnosis not present

## 2017-06-02 DIAGNOSIS — I10 Essential (primary) hypertension: Secondary | ICD-10-CM | POA: Diagnosis not present

## 2017-06-02 DIAGNOSIS — H34831 Tributary (branch) retinal vein occlusion, right eye, with macular edema: Secondary | ICD-10-CM

## 2017-06-02 DIAGNOSIS — H43813 Vitreous degeneration, bilateral: Secondary | ICD-10-CM

## 2017-06-25 ENCOUNTER — Encounter (INDEPENDENT_AMBULATORY_CARE_PROVIDER_SITE_OTHER): Payer: Medicare Other | Admitting: Ophthalmology

## 2017-06-25 DIAGNOSIS — H34831 Tributary (branch) retinal vein occlusion, right eye, with macular edema: Secondary | ICD-10-CM | POA: Diagnosis not present

## 2017-06-25 DIAGNOSIS — H43813 Vitreous degeneration, bilateral: Secondary | ICD-10-CM

## 2017-06-25 DIAGNOSIS — H35033 Hypertensive retinopathy, bilateral: Secondary | ICD-10-CM | POA: Diagnosis not present

## 2017-06-25 DIAGNOSIS — I10 Essential (primary) hypertension: Secondary | ICD-10-CM | POA: Diagnosis not present

## 2017-07-06 ENCOUNTER — Encounter: Payer: Self-pay | Admitting: Family

## 2017-07-06 ENCOUNTER — Ambulatory Visit (HOSPITAL_COMMUNITY)
Admission: RE | Admit: 2017-07-06 | Discharge: 2017-07-06 | Disposition: A | Discharge status: Home / Self Care | Payer: Medicare Other | Source: Ambulatory Visit | Attending: Family | Admitting: Family

## 2017-07-06 ENCOUNTER — Ambulatory Visit (INDEPENDENT_AMBULATORY_CARE_PROVIDER_SITE_OTHER)
Admission: RE | Admit: 2017-07-06 | Discharge: 2017-07-06 | Disposition: A | Discharge status: Home / Self Care | Payer: Medicare Other | Source: Ambulatory Visit | Attending: Family | Admitting: Family

## 2017-07-06 ENCOUNTER — Ambulatory Visit (INDEPENDENT_AMBULATORY_CARE_PROVIDER_SITE_OTHER): Payer: Medicare Other | Admitting: Family

## 2017-07-06 ENCOUNTER — Encounter: Payer: Medicare Other | Admitting: Family

## 2017-07-06 VITALS — BP 143/73 | HR 50 | Temp 97.1°F | Resp 18 | Wt 185.6 lb

## 2017-07-06 DIAGNOSIS — Z9889 Other specified postprocedural states: Secondary | ICD-10-CM | POA: Insufficient documentation

## 2017-07-06 DIAGNOSIS — I6523 Occlusion and stenosis of bilateral carotid arteries: Secondary | ICD-10-CM

## 2017-07-06 LAB — VAS US CAROTID
LCCAPDIAS: 20 cm/s
LCCAPSYS: 70 cm/s
LEFT ECA DIAS: -9 cm/s
LICADSYS: -77 cm/s
LICAPSYS: -72 cm/s
Left CCA dist dias: 17 cm/s
Left CCA dist sys: 50 cm/s
Left ICA dist dias: -31 cm/s
Left ICA prox dias: -24 cm/s
RCCAPSYS: 48 cm/s
RIGHT CCA MID DIAS: -11 cm/s
RIGHT ECA DIAS: -11 cm/s
Right CCA prox dias: 12 cm/s
Right cca dist sys: -62 cm/s

## 2017-07-06 NOTE — Patient Instructions (Addendum)

## 2017-07-06 NOTE — Progress Notes (Signed)
Chief Complaint: Follow up Extracranial Carotid Artery Stenosis   History of Present Illness  [name] is a 83 y.o. male who is status post staged bilateral CEAs in September and December 2008 by Dr. Trula Slade.  He returns today for follow up. He has 2 cardiac stents, denies any history of an MI. His cardiologist is Dr. Debara Pickett. He had a 3 vessel CABG in 1996.  He reports a TIA before Sept. 2008 as manifested by right arm weakness which has resolved, denies strokes or TIA symptoms since he had the CEA's. The patient denies any history of amaurosis fugax or monocular blindness or receptive or expressive aphasia.   He is quite physically active, denies claudication symptoms with walking.  He reports a known heart murmur.  Pt Diabetic: No Pt smoker: non-smoker  Pt meds include: Statin : Yes Betablocker: Yes ASA: yes Other anticoagulants/antiplatelets: yes   Past Medical History:  Diagnosis Date  . Abnormal cardiovascular stress test, 11/2011 wint inferolateral reveribility 12/22/2011  . Angina effort, 12/2011 with DOE anginal equivlent 12/22/2011  . Blood transfusion    "  no reaction to transfusion "  . CAD (coronary artery disease), CABG 1996. 09/23/2011   2D Echo - EF >55%, moderate calcification of the aortic valve leaflets  . Carotid artery disease, Rt. CEA, 08/04/07, Lt. CEA 04/26/07. 12/23/2011  . Chronic kidney disease   . CKD (chronic kidney disease) stage 2, GFR 60-89 ml/min 09/16/2011  . Coronary artery disease   . Crescendo angina ([place]) 06/09/2012  . CVA (cerebral vascular accident) ([place]) 04/22/2007   2D Echo - EF 50-55%, aortic valve thickness mild-moderately increased,  . Dyspnea on exertion, anginal equivilant 10/16/2010   2D Echo - EF >55%, mild-moderate mitral regurgiation, mild-moderate tricuspid regurgitation, moderate calcification of the aortic leaflets  . Dysrhythmia    LLB  anf leaking valve per patient  . HTN (hypertension),uncontrolled 09/16/2011  .  Hypertension   . Peripheral arterial disease ([place])   . Pulmonary hypertension, mild 09/17/2011  . Recurrent angina status post rotational atherectomy, with PTCA for ISR to RCA, 12/22/2011 12/22/2011  . Shortness of breath   . Stroke ([place])    hx of TIA    Social History Social History   Tobacco Use  . Smoking status: Never Smoker  . Smokeless tobacco: Never Used  . Tobacco comment: rarely smoked when he did smoke   Substance Use Topics  . Alcohol use: No    Alcohol/week: 0.0 oz  . Drug use: No    Family History Family History  Problem Relation Age of Onset  . Cancer Mother   . Heart disease Mother   . Stroke Mother   . Alcohol abuse Father   . Depression Father   . Alzheimer's disease Father   . Heart disease Brother        Heart bypass in 1996  . Cancer Brother        Prostate  . Cancer Sister     Surgical History Past Surgical History:  Procedure Laterality Date  .  Fluid knee Right Sept. 6, 2014   Fluid removed  . CARDIAC CATHETERIZATION Left 06/10/2012   Ostial RCA 99% stenosis pre-dilation 3x61mm Angiosculpt PTCA balloon, 3.5x64mm Promus Element DES overlapping proximal stent and into the Aorto-ostium, post-dilated with4x100mm Lindy Trek balloon - 99% stenosis reduced to 0%  . CARDIAC CATHETERIZATION Left 12/22/2011   Left Main-diffuse tapering 50-60% stenosis; mild-moderate diseaseof the right ventricle branch of the right coronary artery; will plan FFR  of ostial RCA  . CARDIAC CATHETERIZATION Bilateral 09/16/2011   Severe 90% ostial RCA disease which is calcified, will likely need PCI to ostial RCA with calcification  . CARDIAC CATHETERIZATION  09/18/2011   Calcified ostial 90-95% proximal RCA stenosis, 3x72mm Emerge balloon predilation, 3x35mm DES PROMUS Element stent was placed, post stent dilation done utilizing a 3.25x34mm noncompliant Quantum Apex balloon, 90-95% stenosis reduced to 0%  . CEA  2008  . CORONARY ANGIOPLASTY    . CORONARY ARTERY BYPASS GRAFT  08/04/2007   . FRACTIONAL FLOW RESERVE WIRE  12/22/2011   Ostial RCA, FFR ratio-0.82, physiologically significant, 3.25x36mm Flextome Cutting balloon deployed at ~3.70mm final estimated diameter  . FRACTIONAL FLOW RESERVE WIRE Right 12/22/2011   Performed by Pixie Casino., MD at [facility] CATH LAB  . LEFT AND RIGHT HEART CATHETERIZATION WITH CORONARY ANGIOGRAM Left 09/16/2011   Performed by Pixie Casino., MD at [facility] CATH LAB  . LEFT HEART CATHETERIZATION WITH CORONARY ANGIOGRAM N/A 06/10/2012   Performed by Pixie Casino., MD at [facility] CATH LAB  . LEFT HEART CATHETERIZATION WITH CORONARY/GRAFT ANGIOGRAM N/A 12/22/2011   Performed by Pixie Casino., MD at [facility] CATH LAB  . PERCUTANEOUS CORONARY ROTOBLATOR INTERVENTION (PCI-R)  09/18/2011   Performed by Troy Sine, MD at [facility] CATH LAB  . PERCUTANEOUS CORONARY STENT INTERVENTION (PCI-S) N/A 09/18/2011   Performed by Troy Sine, MD at [facility] CATH LAB  . TEMPORARY PACEMAKER INSERTION  09/18/2011   Performed by Troy Sine, MD at [facility] CATH LAB    Allergies  Allergen Reactions  . Atorvastatin Other (See Comments)    Muscle and joint pain, can tolerate rosuvastatin    Current Outpatient Medications  Medication Sig Dispense Refill  . amLODipine (NORVASC) 10 MG tablet Take 1 tablet (10 mg total) by mouth at bedtime. 30 tablet 6  . aspirin EC 81 MG tablet Take 81 mg by mouth daily.    . clopidogrel (PLAVIX) 75 MG tablet Take 1 tablet (75 mg total) by mouth daily. 90 tablet 3  . fish oil-omega-3 fatty acids 1000 MG capsule Take 1 g by mouth 3 (three) times daily.    . isosorbide mononitrate (IMDUR) 60 MG 24 hr tablet TAKE 1 & 1/2 TABLETS BY MOUTH ONCE DAILY 45 tablet 2  . metoprolol tartrate (LOPRESSOR) 25 MG tablet Take 25 mg by mouth 2 (two) times daily.    . niacin (NIASPAN) 500 MG CR tablet Take 500 mg by mouth at bedtime.    . nitroGLYCERIN (NITROSTAT) 0.4 MG SL tablet Place 1 tablet (0.4 mg total) under the tongue every 5 (five) minutes x 3 doses as needed.  25 tablet 3  . rosuvastatin (CRESTOR) 40 MG tablet Take 1/3 tablet by mouth daily.     No current facility-administered medications for this visit.    Facility-Administered Medications Ordered in Other Visits  Medication Dose Route Frequency Provider Last Rate Last Dose  . sodium chloride 0.9 % injection 3 mL  3 mL Intravenous PRN [name], Nadean Corwin, MD        Review of Systems : See HPI for pertinent positives and negatives.  Physical Examination  Vitals:   07/06/17 1123 07/06/17 1125  BP: (!) 151/72 (!) 143/73  Pulse: (!) 50   Resp: 18   Temp: (!) 97.1 F (36.2 C)   TempSrc: Oral   SpO2: 100%   Weight: 185 lb 9.6 oz (84.2 kg)    Body mass index is 27.02 kg/m.  General:  WDWN male in NAD GAIT:normal Eyes: PERRLA Pulmonary: Respirations are non labored, CTAB, no rales, rhonchi, or wheezing.  Cardiac: regular rhythm,+murmur.  VASCULAR EXAM Carotid Bruits Left Right   Transmitted cardiac murmur Transmitted cardiac murmur    Abdominal aortic pulse is not palpable. Radial pulses are 2+ palpable and equal.      LE Pulses LEFT RIGHT   POPLITEAL not palpable  not palpable   POSTERIOR TIBIAL  1+palpable   1+palpable    DORSALIS PEDIS   3+palpable  3+palpable     Gastrointestinal: soft, nontender, BS WNL, no r/g, no palpated masses.  Musculoskeletal: No muscle atrophy/wasting. M/S 5/5 throughout, Extremities without ischemic changes.  Neurologic: A&O X 3; appropriate affect, speech is normal, CN 2-12 intact except for mild right sided facial droop, Pain and light touch intact in extremities, Motor exam as listed above    Assessment: [name] is a 83 y.o. male whois status post staged bilateral CEAs in September and December 2008. He had a preoperative TIA in September  of 2008, none subsequently.  DATA  Carotid Duplex (07/06/17): Patent bilateral internal carotid arteries with history of carotid endarterectomy, no evidence for restenosis; however, mild hyperplasia noted in bilateral surgical bulbs.  Bilateral vertebral artery flow is antegrade. Bilateral subclavian artery waveforms are normal.  Essentially unchanged compared to the exams on 06-25-15 and 06-21-16.    Plan: Follow-up in 18 months with Carotid Duplex scan.    I discussed in depth with the patient the nature of atherosclerosis, and emphasized the importance of maximal medical management including strict control of blood pressure, blood glucose, and lipid levels, obtaining regular exercise, and continued cessation of smoking.  The patient is aware that without maximal medical management the underlying atherosclerotic disease process will progress, limiting the benefit of any interventions. The patient was given information about stroke prevention and what symptoms should prompt the patient to seek immediate medical care. Thank you for allowing [place] to participate in this patient's care.  Clemon Chambers, RN, MSN, FNP-C [facility] Office: [facility] Physician: Trula Slade  07/06/17 11:38 AM

## 2017-07-06 NOTE — Patient Instructions (Signed)

## 2017-07-07 NOTE — Progress Notes (Signed)
Erroneous encounter, disregard

## 2017-07-17 ENCOUNTER — Encounter: Payer: Self-pay | Admitting: Internal Medicine

## 2017-07-17 ENCOUNTER — Telehealth: Payer: Self-pay | Admitting: Internal Medicine

## 2017-07-17 ENCOUNTER — Ambulatory Visit (INDEPENDENT_AMBULATORY_CARE_PROVIDER_SITE_OTHER): Payer: Medicare Other | Admitting: Internal Medicine

## 2017-07-17 VITALS — BP 136/56 | HR 46 | Ht 71.5 in | Wt 183.0 lb

## 2017-07-17 DIAGNOSIS — D689 Coagulation defect, unspecified: Secondary | ICD-10-CM

## 2017-07-17 DIAGNOSIS — I25119 Atherosclerotic heart disease of native coronary artery with unspecified angina pectoris: Secondary | ICD-10-CM | POA: Diagnosis not present

## 2017-07-17 DIAGNOSIS — I350 Nonrheumatic aortic (valve) stenosis: Secondary | ICD-10-CM | POA: Diagnosis not present

## 2017-07-17 DIAGNOSIS — Z01818 Encounter for other preprocedural examination: Secondary | ICD-10-CM | POA: Diagnosis not present

## 2017-07-17 DIAGNOSIS — R5383 Other fatigue: Secondary | ICD-10-CM | POA: Diagnosis not present

## 2017-07-17 DIAGNOSIS — Z01812 Encounter for preprocedural laboratory examination: Secondary | ICD-10-CM | POA: Diagnosis not present

## 2017-07-17 NOTE — Telephone Encounter (Signed)
Pt was senn today and to clal back with procedure dates that work for him, any day the week of 07-27-17, pls call [phone]

## 2017-07-17 NOTE — H&P (View-Only) (Signed)
OFFICE NOTE  Chief Complaint:  Chest pain, increasing fatigue  Primary Care Physician: Deland Pretty, MD  HPI:  [name] is a 83 year old gentleman who had unfortunate aggressive restenosis of an RCA lesion in the past, recently restented. He presented with worsening anginal symptoms and medical therapy was recommended in November. He was scheduled for a nuclear stress test which he underwent on July 21, 2012, and this was negative for ischemia. Recently, he had been experiencing some soreness in his left chest wall which has been going on and off for about a month. He is not experiencing the shortness of breath that he previously experienced with his angina, especially when walking up hills. He noted that he had an episode of a fall when he was trying to lift a very heavy deer, landed on his back and perhaps strained or sprained intercostal muscles or something in the chest wall. Based on his symptoms he underwent stress testing which indicated ischemia and he underwent a repeat coronary intervention to the RCA.   At his last office visit he was doing fairly well, without worsening shortness of breath or chest discomfort.  [place] did [place] but noted a small increase in shortness of breath with activity. He's also had weight gain recently and some lower extremity swelling. He says his symptoms are somewhat reminiscent of the symptoms that required prior coronary intervention about one year ago, but not as significant. Of note, he does have moderate to severe aortic stenosis which were touching very closely and a history of diastolic dysfunction plus or minus heart failure.  [name] returns today and is feeling quite well. He denies any chest pain or shortness of breath. He told me of an episode recently where he helped deliver car and had to walk back home. Walked about 2 half miles during the middle of the day in the heat and had no problems or shortness  of breath or chest discomfort. He actually is feeling the best he has in some time.  [name] back in the office today. He continues to be active. He's been hunting recently and doing a lot of exertion without any worsening shortness of breath or chest pain. He recently had problems with some pain in his legs which he attributed to combination of Crestor and that he had. He discontinued his area and wean down his Crestor. He is currently getting 40 mg tablets and breaking them into thirds. He's taking one third of a 40 mg tablet are roughly 13 mg daily which she says is tolerable. His most recent lipid profile from September 2015 shows a total cholesterol 118, temperature 31, HDL 59 and LDL of 43 which is very low. He should be able to tolerate the decrease in his Crestor and still be at target numbers. He did have a lipoma profile and MR in March 2015. I believe there was an air in triglycerides which read over 700, but LDL-C was less than 100, LDL-P was 700 and generally showed a very favorable profile.  [name] returns today in the office for follow-up. Overall he reports doing fairly well. He continues to be active during the summer denies any chest pain or worsening shortness of breath. He has follow-up of his carotid endarterectomies in November. We are following him for aortic stenosis and he does not seem to be symptomatic with that.  [name] back in the office today for follow-up.  He reports that he has developed again some exertional chest pain over the past few weeks. He feels like a soreness in the left chest wall, but it is also deep inside. Can come at rest, but worse with exertion. There is some exertional dyspnea as well. He has moderate AS by his most recent echo in 03/2015.   [name] returns today for follow-up. He underwent nuclear stress testing which was negative for ischemia. EF was normal at 58%. He reports his chest discomfort is gone away. He is now getting over a chest  cold. I suspect some of his pain was musculoskeletal.  04/14/2016  [name] was seen back today in the office for follow-up. He reports that he is doing very well denies any shortness of breath or chest pain. He is very physically active. In February he underwent nuclear stress testing which was negative for ischemia. We are following moderate aortic stenosis which was seen on his echo last year. He does have stage III chronic kidney disease as well. He's followed by vein and vascular surgery for bilateral carotid endarterectomies. Blood pressure is well-controlled today. Recent lipid profile was within normal limits and is followed by Dr. [name].  10/13/2016  [name] was seen again in follow-up today. He reports feeling "scaringly well". He says he has not felt this well on long time. 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 He denies any chest pain or worsening shortness of breath. He did have an echocardiogram which showed moderate aortic stenosis. Continue to follow that as it is likely to progress despite adequate control of blood pressure and cholesterol. He also has carotid artery disease and is followed by vein and vascular surgery.  04/16/2017  [name] returns today for follow-up. He denies any new chest pain or worsening shortness of breath. His blood pressures been well controlled. Where continue to follow his aortic stenosis and he has an upcoming echo in September. He recently changed primary care providers to Dr. [name], since Dr. [name] has retired. He denies any anginal chest pain.  07/17/2017  [name] was seen today in follow-up.  He says over the past several months he has had some increasing shortness of breath, chest pain and fatigue.  In September he had a repeat echo which showed a stable aortic valve gradient and moderate aortic stenosis.  He has a history of recurrent coronary artery disease  requiring multiple coronary interventions.  Up until recently he has done well without anginal symptoms.  He is noted recently however while hunting or doing other activities requiring exertion, that has had more frequent chest pain, shortness of breath and some fatigue.  He his primary care provider increased his isosorbide up to 90 mg twice daily.  Despite this change is not noted an improvement in his symptoms.  PMHx:  Past Medical History:  Diagnosis Date  . Abnormal cardiovascular stress test, 11/2011 wint inferolateral reveribility 12/22/2011  . Angina effort, 12/2011 with DOE anginal equivlent 12/22/2011  . Blood transfusion    "  no reaction to transfusion "  . CAD (coronary artery disease), CABG 1996. 09/23/2011   2D Echo - EF >55%, moderate calcification of the aortic valve leaflets  . Carotid artery disease, Rt. CEA, 08/04/07, Lt. CEA 04/26/07. 12/23/2011  . Chronic kidney disease   . CKD (chronic kidney disease) stage 2, GFR 60-89 ml/min 09/16/2011  . Coronary artery disease   .  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 CVA (cerebral vascular accident) ([place]) 04/22/2007   2D Echo - EF 50-55%, aortic valve thickness mild-moderately increased,  . Dyspnea on exertion, anginal equivilant 10/16/2010   2D Echo - EF >55%, mild-moderate mitral regurgiation, mild-moderate tricuspid regurgitation, moderate calcification of the aortic leaflets  . Dysrhythmia    LLB  anf leaking valve per patient  . HTN (hypertension),uncontrolled 09/16/2011  . Hypertension   . Peripheral arterial disease ([place])   . Pulmonary hypertension, mild 09/17/2011  . Recurrent angina status post rotational atherectomy, with PTCA for ISR to RCA, 12/22/2011 12/22/2011  . Shortness of breath   . Stroke ([place])    hx of TIA    Past Surgical History:  Procedure Laterality Date  .  Fluid knee Right Sept. 6, 2014   Fluid removed  . CARDIAC CATHETERIZATION Left 06/10/2012   Ostial RCA 99% stenosis pre-dilation 3x20mm Angiosculpt PTCA balloon,  3.5x79mm Promus Element DES overlapping proximal stent and into the Aorto-ostium, post-dilated with4x25mm Highfill Trek balloon - 99% stenosis reduced to 0%  . CARDIAC CATHETERIZATION Left 12/22/2011   Left Main-diffuse tapering 50-60% stenosis; mild-moderate diseaseof the right ventricle branch of the right coronary artery; will plan FFR of ostial RCA  . CARDIAC CATHETERIZATION Bilateral 09/16/2011   Severe 90% ostial RCA disease which is calcified, will likely need PCI to ostial RCA with calcification  . CARDIAC CATHETERIZATION  09/18/2011   Calcified ostial 90-95% proximal RCA stenosis, 3x63mm Emerge balloon predilation, 3x75mm DES PROMUS Element stent was placed, post stent dilation done utilizing a 3.25x14mm noncompliant Quantum Apex balloon, 90-95% stenosis reduced to 0%  . CEA  2008  . CORONARY ANGIOPLASTY    . CORONARY ARTERY BYPASS GRAFT  08/04/2007  . FRACTIONAL FLOW RESERVE WIRE  12/22/2011   Ostial RCA, FFR ratio-0.82, physiologically significant, 3.25x71mm Flextome Cutting balloon deployed at ~3.86mm final estimated diameter  . FRACTIONAL FLOW RESERVE WIRE Right 12/22/2011   Procedure: FRACTIONAL FLOW RESERVE WIRE;  Surgeon: Pixie Casino, MD;  Location: [facility] CATH LAB;  Service: Cardiovascular;  Laterality: Right;  . LEFT AND RIGHT HEART CATHETERIZATION WITH CORONARY ANGIOGRAM Left 09/16/2011   Procedure: LEFT AND RIGHT HEART CATHETERIZATION WITH CORONARY ANGIOGRAM;  Surgeon: Pixie Casino, MD;  Location: [facility] CATH LAB;  Service: Cardiovascular;  Laterality: Left;  . LEFT HEART CATHETERIZATION WITH CORONARY ANGIOGRAM N/A 06/10/2012   Procedure: LEFT HEART CATHETERIZATION WITH CORONARY ANGIOGRAM;  Surgeon: Pixie Casino, MD;  Location: [facility] CATH LAB;  Service: Cardiovascular;  Laterality: N/A;  . LEFT HEART CATHETERIZATION WITH CORONARY/GRAFT ANGIOGRAM N/A 12/22/2011   Procedure: LEFT HEART CATHETERIZATION WITH Beatrix Fetters;  Surgeon: Pixie Casino, MD;  Location: [facility] CATH LAB;  Service:  Cardiovascular;  Laterality: N/A;  . PERCUTANEOUS CORONARY ROTOBLATOR INTERVENTION (PCI-R)  09/18/2011   Procedure: PERCUTANEOUS CORONARY ROTOBLATOR INTERVENTION (PCI-R);  Surgeon: Troy Sine, MD;  Location: [facility] CATH LAB;  Service: Cardiovascular;;  . PERCUTANEOUS CORONARY STENT INTERVENTION (PCI-S) N/A 09/18/2011   Procedure: PERCUTANEOUS CORONARY STENT INTERVENTION (PCI-S);  Surgeon: Troy Sine, MD;  Location: [facility] CATH LAB;  Service: Cardiovascular;  Laterality: N/A;  . TEMPORARY PACEMAKER INSERTION  09/18/2011   Procedure: TEMPORARY PACEMAKER INSERTION;  Surgeon: Troy Sine, MD;  Location[facility] CATH LAB;  Service: Cardiovascular;;    FAMHx:  Family History  Problem Relation Age of Onset  . Cancer Mother   . Heart disease Mother   . Stroke Mother   . Alcohol abuse Father   . Depression Father   .  Alzheimer's disease Father   . Heart disease Brother        Heart bypass in 1996  . Cancer Brother        Prostate  . Cancer Sister     SOCHx:   reports that  has never smoked. he has never used smokeless tobacco. He reports that he does not drink alcohol or use drugs.  ALLERGIES:  Allergies  Allergen Reactions  . Atorvastatin Other (See Comments)    Muscle and joint pain, can tolerate rosuvastatin    ROS: Pertinent items noted in HPI and remainder of comprehensive ROS otherwise negative.  HOME MEDS: Current Outpatient Medications  Medication Sig Dispense Refill  . amLODipine (NORVASC) 10 MG tablet Take 1 tablet (10 mg total) by mouth at bedtime. 30 tablet 6  . aspirin EC 81 MG tablet Take 81 mg by mouth daily.    . clopidogrel (PLAVIX) 75 MG tablet Take 1 tablet (75 mg total) by mouth daily. 90 tablet 3  . fish oil-omega-3 fatty acids 1000 MG capsule Take 1 g by mouth 3 (three) times daily.    . isosorbide mononitrate (IMDUR) 60 MG 24 hr tablet TAKE 1 & 1/2 TABLETS BY MOUTH ONCE DAILY 45 tablet 2  . metoprolol tartrate (LOPRESSOR) 25 MG tablet Take 25 mg by mouth 2 (two)  times daily.    . niacin (NIASPAN) 500 MG CR tablet Take 500 mg by mouth at bedtime.    . nitroGLYCERIN (NITROSTAT) 0.4 MG SL tablet Place 1 tablet (0.4 mg total) under the tongue every 5 (five) minutes x 3 doses as needed. 25 tablet 3  . rosuvastatin (CRESTOR) 40 MG tablet Take 1/3 tablet by mouth daily.     No current facility-administered medications for this visit.    Facility-Administered Medications Ordered in Other Visits  Medication Dose Route Frequency Provider Last Rate Last Dose  . sodium chloride 0.9 % injection 3 mL  3 mL Intravenous PRN Eljay Lave, Nadean Corwin, MD        LABS/IMAGING: No results found for this or any previous visit (from the past 48 hour(s)). No results found.  VITALS: BP (!) 136/56   Pulse (!) 46   Ht 5' 11.5" (1.816 m)   Wt 183 lb (83 kg)   BMI 25.17 kg/m   EXAM: General appearance: alert and no distress Neck: no carotid bruit and no JVD Lungs: clear to auscultation bilaterally Heart: regular rate and rhythm, S1, S2 normal and systolic murmur: systolic ejection 3/6, crescendo at 2nd right intercostal space Abdomen: soft, non-tender; bowel sounds normal; no masses,  no organomegaly Extremities: extremities normal, atraumatic, no cyanosis or edema Pulses: 2+ and symmetric Skin: Skin color, texture, turgor normal. No rashes or lesions Neurologic: Grossly normal Psych: Pleasant  EKG: Sinus bradycardia at 46, rightward axis, LBBB with marked inferolateral T wave changes  ASSESSMENT: 1. Coronary artery disease with multiple recent PCI to the right coronary - low risk NST- EF 58% (2017) 2. Frequent fusion beats-asymptomatic 3. History of bilateral carotid endarterectomies 4. Hypertension 5. Dyslipidemia 6. LBBB 7. Stage III chronic kidney disease 8. Moderate aortic stenosis  PLAN: 1.   [name] is describing chest pain and progressive fatigue.  He has at least moderate aortic stenosis however his valve sounds somewhat worse today on exam and I do  not appreciate an S2.  I suspect he may have severe left ear as a cause of his symptoms.  He could have recurrent coronary disease as well which has been  an issue in the past.  I like to schedule him for a repeat right and left heart catheterization.  If he does not have any significant recurrent coronary disease, there should be evaluation for the severity of his aortic stenosis.  He may be a candidate for aortic valve surgery.  He does have stage III chronic kidney disease and may need admission for hydration prior to the procedure.  We discussed again the risks, benefits and alternatives of cardiac catheterization, which he is familiar with given numerous prior procedures and he is willing to proceed.  Pixie Casino, MD, [facility], [place] Director of the [facility]  [facility] Attending Cardiologist  Direct Dial: [phone]  Fax: [phone]  Website:  [email or web address] 07/17/2017, 1:52 PM

## 2017-07-17 NOTE — Telephone Encounter (Signed)
Called cath lab and scheduled patient for R&L heart cath on 07/30/17 with Dr. Pernell Dupre @ 0730, patient to arrive @ 0530 and have labs done prior.

## 2017-07-17 NOTE — Progress Notes (Signed)
OFFICE NOTE  Chief Complaint:  Chest pain, increasing fatigue  Primary Care Physician: Deland Pretty, MD  HPI:  [name] is a 83 year old gentleman who had unfortunate aggressive restenosis of an RCA lesion in the past, recently restented. He presented with worsening anginal symptoms and medical therapy was recommended in November. He was scheduled for a nuclear stress test which he underwent on July 21, 2012, and this was negative for ischemia. Recently, he had been experiencing some soreness in his left chest wall which has been going on and off for about a month. He is not experiencing the shortness of breath that he previously experienced with his angina, especially when walking up hills. He noted that he had an episode of a fall when he was trying to lift a very heavy deer, landed on his back and perhaps strained or sprained intercostal muscles or something in the chest wall. Based on his symptoms he underwent stress testing which indicated ischemia and he underwent a repeat coronary intervention to the RCA.   At his last office visit he was doing fairly well, without worsening shortness of breath or chest discomfort.  She did make it through the [place] this [place] but noted a small increase in shortness of breath with activity. He's also had weight gain recently and some lower extremity swelling. He says his symptoms are somewhat reminiscent of the symptoms that required prior coronary intervention about one year ago, but not as significant. Of note, he does have moderate to severe aortic stenosis which were touching very closely and a history of diastolic dysfunction plus or minus heart failure.  [name] returns today and is feeling quite well. He denies any chest pain or shortness of breath. He told me of an episode recently where he helped deliver car and had to walk back home. Walked about 2 half miles during the middle of the day in the heat and had no problems or shortness  of breath or chest discomfort. He actually is feeling the best he has in some time.  [name] back in the office today. He continues to be active. He's been hunting recently and doing a lot of exertion without any worsening shortness of breath or chest pain. He recently had problems with some pain in his legs which he attributed to combination of Crestor and that he had. He discontinued his area and wean down his Crestor. He is currently getting 40 mg tablets and breaking them into thirds. He's taking one third of a 40 mg tablet are roughly 13 mg daily which she says is tolerable. His most recent lipid profile from September 2015 shows a total cholesterol 118, temperature 31, HDL 59 and LDL of 43 which is very low. He should be able to tolerate the decrease in his Crestor and still be at target numbers. He did have a lipoma profile and MR in March 2015. I believe there was an air in triglycerides which read over 700, but LDL-C was less than 100, LDL-P was 700 and generally showed a very favorable profile.  [name] returns today in the office for follow-up. Overall he reports doing fairly well. He continues to be active during the summer denies any chest pain or worsening shortness of breath. He has follow-up of his carotid endarterectomies in November. We are following him for aortic stenosis and he does not seem to be symptomatic with that.  [name] back in the office today for follow-up.  He reports that he has developed again some exertional chest pain over the past few weeks. He feels like a soreness in the left chest wall, but it is also deep inside. Can come at rest, but worse with exertion. There is some exertional dyspnea as well. He has moderate AS by his most recent echo in 03/2015.   [name] returns today for follow-up. He underwent nuclear stress testing which was negative for ischemia. EF was normal at 58%. He reports his chest discomfort is gone away. He is now getting over a chest  cold. I suspect some of his pain was musculoskeletal.  04/14/2016  [name] was seen back today in the office for follow-up. He reports that he is doing very well denies any shortness of breath or chest pain. He is very physically active. In February he underwent nuclear stress testing which was negative for ischemia. We are following moderate aortic stenosis which was seen on his echo last year. He does have stage III chronic kidney disease as well. He's followed by vein and vascular surgery for bilateral carotid endarterectomies. Blood pressure is well-controlled today. Recent lipid profile was within normal limits and is followed by Dr. [name].  10/13/2016  [name] was seen again in follow-up today. He reports feeling "scaringly well". He says he has not felt this well on long time. 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 He denies any chest pain or worsening shortness of breath. He did have an echocardiogram which showed moderate aortic stenosis. Continue to follow that as it is likely to progress despite adequate control of blood pressure and cholesterol. He also has carotid artery disease and is followed by vein and vascular surgery.  04/16/2017  [name] returns today for follow-up. He denies any new chest pain or worsening shortness of breath. His blood pressures been well controlled. Where continue to follow his aortic stenosis and he has an upcoming echo in September. He recently changed primary care providers to Dr. [name], since Dr. [name] has retired. He denies any anginal chest pain.  07/17/2017  [name] was seen today in follow-up.  He says over the past several months he has had some increasing shortness of breath, chest pain and fatigue.  In September he had a repeat echo which showed a stable aortic valve gradient and moderate aortic stenosis.  He has a history of recurrent coronary artery disease  requiring multiple coronary interventions.  Up until recently he has done well without anginal symptoms.  He is noted recently however while hunting or doing other activities requiring exertion, that has had more frequent chest pain, shortness of breath and some fatigue.  He his primary care provider increased his isosorbide up to 90 mg twice daily.  Despite this change is not noted an improvement in his symptoms.  PMHx:  Past Medical History:  Diagnosis Date  . Abnormal cardiovascular stress test, 11/2011 wint inferolateral reveribility 12/22/2011  . Angina effort, 12/2011 with DOE anginal equivlent 12/22/2011  . Blood transfusion    "  no reaction to transfusion "  . CAD (coronary artery disease), CABG 1996. 09/23/2011   2D Echo - EF >55%, moderate calcification of the aortic valve leaflets  . Carotid artery disease, Rt. CEA, 08/04/07, Lt. CEA 04/26/07. 12/23/2011  . Chronic kidney disease   . CKD (chronic kidney disease) stage 2, GFR 60-89 ml/min 09/16/2011  . Coronary artery disease   .  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 CVA (cerebral vascular accident) ([place]) 04/22/2007   2D Echo - EF 50-55%, aortic valve thickness mild-moderately increased,  . Dyspnea on exertion, anginal equivilant 10/16/2010   2D Echo - EF >55%, mild-moderate mitral regurgiation, mild-moderate tricuspid regurgitation, moderate calcification of the aortic leaflets  . Dysrhythmia    LLB  anf leaking valve per patient  . HTN (hypertension),uncontrolled 09/16/2011  . Hypertension   . Peripheral arterial disease ([place])   . Pulmonary hypertension, mild 09/17/2011  . Recurrent angina status post rotational atherectomy, with PTCA for ISR to RCA, 12/22/2011 12/22/2011  . Shortness of breath   . Stroke ([place])    hx of TIA    Past Surgical History:  Procedure Laterality Date  .  Fluid knee Right Sept. 6, 2014   Fluid removed  . CARDIAC CATHETERIZATION Left 06/10/2012   Ostial RCA 99% stenosis pre-dilation 3x29mm Angiosculpt PTCA balloon,  3.5x36mm Promus Element DES overlapping proximal stent and into the Aorto-ostium, post-dilated with4x80mm  Trek balloon - 99% stenosis reduced to 0%  . CARDIAC CATHETERIZATION Left 12/22/2011   Left Main-diffuse tapering 50-60% stenosis; mild-moderate diseaseof the right ventricle branch of the right coronary artery; will plan FFR of ostial RCA  . CARDIAC CATHETERIZATION Bilateral 09/16/2011   Severe 90% ostial RCA disease which is calcified, will likely need PCI to ostial RCA with calcification  . CARDIAC CATHETERIZATION  09/18/2011   Calcified ostial 90-95% proximal RCA stenosis, 3x93mm Emerge balloon predilation, 3x33mm DES PROMUS Element stent was placed, post stent dilation done utilizing a 3.25x22mm noncompliant Quantum Apex balloon, 90-95% stenosis reduced to 0%  . CEA  2008  . CORONARY ANGIOPLASTY    . CORONARY ARTERY BYPASS GRAFT  08/04/2007  . FRACTIONAL FLOW RESERVE WIRE  12/22/2011   Ostial RCA, FFR ratio-0.82, physiologically significant, 3.25x10mm Flextome Cutting balloon deployed at ~3.50mm final estimated diameter  . FRACTIONAL FLOW RESERVE WIRE Right 12/22/2011   Procedure: FRACTIONAL FLOW RESERVE WIRE;  Surgeon: Pixie Casino, MD;  Location: [facility] CATH LAB;  Service: Cardiovascular;  Laterality: Right;  . LEFT AND RIGHT HEART CATHETERIZATION WITH CORONARY ANGIOGRAM Left 09/16/2011   Procedure: LEFT AND RIGHT HEART CATHETERIZATION WITH CORONARY ANGIOGRAM;  Surgeon: Pixie Casino, MD;  Location: [facility] CATH LAB;  Service: Cardiovascular;  Laterality: Left;  . LEFT HEART CATHETERIZATION WITH CORONARY ANGIOGRAM N/A 06/10/2012   Procedure: LEFT HEART CATHETERIZATION WITH CORONARY ANGIOGRAM;  Surgeon: Pixie Casino, MD;  Location: [facility] CATH LAB;  Service: Cardiovascular;  Laterality: N/A;  . LEFT HEART CATHETERIZATION WITH CORONARY/GRAFT ANGIOGRAM N/A 12/22/2011   Procedure: LEFT HEART CATHETERIZATION WITH Beatrix Fetters;  Surgeon: Pixie Casino, MD;  Location: [facility] CATH LAB;  Service:  Cardiovascular;  Laterality: N/A;  . PERCUTANEOUS CORONARY ROTOBLATOR INTERVENTION (PCI-R)  09/18/2011   Procedure: PERCUTANEOUS CORONARY ROTOBLATOR INTERVENTION (PCI-R);  Surgeon: Troy Sine, MD;  Location: [facility] CATH LAB;  Service: Cardiovascular;;  . PERCUTANEOUS CORONARY STENT INTERVENTION (PCI-S) N/A 09/18/2011   Procedure: PERCUTANEOUS CORONARY STENT INTERVENTION (PCI-S);  Surgeon: Troy Sine, MD;  Location: [facility] CATH LAB;  Service: Cardiovascular;  Laterality: N/A;  . TEMPORARY PACEMAKER INSERTION  09/18/2011   Procedure: TEMPORARY PACEMAKER INSERTION;  Surgeon: Troy Sine, MD;  Location: [facility] CATH LAB;  Service: Cardiovascular;;    FAMHx:  Family History  Problem Relation Age of Onset  . Cancer Mother   . Heart disease Mother   . Stroke Mother   . Alcohol abuse Father   . Depression Father   .  Alzheimer's disease Father   . Heart disease Brother        Heart bypass in 1996  . Cancer Brother        Prostate  . Cancer Sister     SOCHx:   reports that  has never smoked. he has never used smokeless tobacco. He reports that he does not drink alcohol or use drugs.  ALLERGIES:  Allergies  Allergen Reactions  . Atorvastatin Other (See Comments)    Muscle and joint pain, can tolerate rosuvastatin    ROS: Pertinent items noted in HPI and remainder of comprehensive ROS otherwise negative.  HOME MEDS: Current Outpatient Medications  Medication Sig Dispense Refill  . amLODipine (NORVASC) 10 MG tablet Take 1 tablet (10 mg total) by mouth at bedtime. 30 tablet 6  . aspirin EC 81 MG tablet Take 81 mg by mouth daily.    . clopidogrel (PLAVIX) 75 MG tablet Take 1 tablet (75 mg total) by mouth daily. 90 tablet 3  . fish oil-omega-3 fatty acids 1000 MG capsule Take 1 g by mouth 3 (three) times daily.    . isosorbide mononitrate (IMDUR) 60 MG 24 hr tablet TAKE 1 & 1/2 TABLETS BY MOUTH ONCE DAILY 45 tablet 2  . metoprolol tartrate (LOPRESSOR) 25 MG tablet Take 25 mg by mouth 2 (two)  times daily.    . niacin (NIASPAN) 500 MG CR tablet Take 500 mg by mouth at bedtime.    . nitroGLYCERIN (NITROSTAT) 0.4 MG SL tablet Place 1 tablet (0.4 mg total) under the tongue every 5 (five) minutes x 3 doses as needed. 25 tablet 3  . rosuvastatin (CRESTOR) 40 MG tablet Take 1/3 tablet by mouth daily.     No current facility-administered medications for this visit.    Facility-Administered Medications Ordered in Other Visits  Medication Dose Route Frequency Provider Last Rate Last Dose  . sodium chloride 0.9 % injection 3 mL  3 mL Intravenous PRN Timtohy Broski, Nadean Corwin, MD        LABS/IMAGING: No results found for this or any previous visit (from the past 48 hour(s)). No results found.  VITALS: BP (!) 136/56   Pulse (!) 46   Ht 5' 11.5" (1.816 m)   Wt 183 lb (83 kg)   BMI 25.17 kg/m   EXAM: General appearance: alert and no distress Neck: no carotid bruit and no JVD Lungs: clear to auscultation bilaterally Heart: regular rate and rhythm, S1, S2 normal and systolic murmur: systolic ejection 3/6, crescendo at 2nd right intercostal space Abdomen: soft, non-tender; bowel sounds normal; no masses,  no organomegaly Extremities: extremities normal, atraumatic, no cyanosis or edema Pulses: 2+ and symmetric Skin: Skin color, texture, turgor normal. No rashes or lesions Neurologic: Grossly normal Psych: Pleasant  EKG: Sinus bradycardia at 46, rightward axis, LBBB with marked inferolateral T wave changes  ASSESSMENT: 1. Coronary artery disease with multiple recent PCI to the right coronary - low risk NST- EF 58% (2017) 2. Frequent fusion beats-asymptomatic 3. History of bilateral carotid endarterectomies 4. Hypertension 5. Dyslipidemia 6. LBBB 7. Stage III chronic kidney disease 8. Moderate aortic stenosis  PLAN: 1.   [name] is describing chest pain and progressive fatigue.  He has at least moderate aortic stenosis however his valve sounds somewhat worse today on exam and I do  not appreciate an S2.  I suspect he may have severe left ear as a cause of his symptoms.  He could have recurrent coronary disease as well which has been  an issue in the past.  I like to schedule him for a repeat right and left heart catheterization.  If he does not have any significant recurrent coronary disease, there should be evaluation for the severity of his aortic stenosis.  He may be a candidate for aortic valve surgery.  He does have stage III chronic kidney disease and may need admission for hydration prior to the procedure.  We discussed again the risks, benefits and alternatives of cardiac catheterization, which he is familiar with given numerous prior procedures and he is willing to proceed.  Pixie Casino, MD, [facility], [place] Director of the [facility] Lipid Disorders &  Cardiovascular [facility] Reduction [facility] Attending Cardiologist  Direct Dial: [phone]  Fax: [phone]  Website:  [email or web address] Osgood Yaneth Fairbairn 07/17/2017, 1:52 PM

## 2017-07-17 NOTE — Telephone Encounter (Signed)
LMTCB to discuss procedure date/time

## 2017-07-17 NOTE — Patient Instructions (Signed)
   [facility] [address] [place] [address] Dept: [phone] Loc: [phone]  DAMONTE FRIESON  07/17/2017  You are scheduled for a RIGHT & LEFT Cardiac Catheterization on ____________, December ____ with Dr._______________.  1. Please arrive at the [facility] (Main Entrance A) at [facility]: [address] [place], [place] [address] at __________ (two hours before your procedure to ensure your preparation). Free valet parking service is available.   Special note: Every effort is made to have your procedure done on time. Please understand that emergencies sometimes delay scheduled procedures.  2. Diet: Do not eat or drink anything after midnight prior to your procedure except sips of water to take medications.  3. Labs + Chest X-Ray - you will need to have this done NO MORE than 14 days prior to your procedure  4. Medication instructions in preparation for your procedure:  On the morning of your procedure, take your Aspirin + Plavix and any morning medicines NOT listed above.  You may use sips of water.  5. Plan for one night stay--bring personal belongings. 6. Bring a current list of your medications and current insurance cards. 7. You MUST have a responsible person to drive you home. 8. Someone MUST be with you the first 24 hours after you arrive home or your discharge will be delayed. 9. Please wear clothes that are easy to get on and off and wear slip-on shoes.  Thank you for allowing [place] to care for you!   -- [facility]

## 2017-07-20 ENCOUNTER — Telehealth: Payer: Self-pay | Admitting: Internal Medicine

## 2017-07-20 NOTE — Telephone Encounter (Signed)
Called patient and per wife, he is unavailable - out at the store. Asked wife to have him return call and ask for [place]

## 2017-07-20 NOTE — Telephone Encounter (Signed)
Called patient. Documented in previously open encounter.

## 2017-07-20 NOTE — Telephone Encounter (Signed)
Spoke with patient. He reports it is difficult to get thru to our office. He reports having to wait 23 minutes on the phone. Apologized for delay.   Reviewed cath instructions with patient - MD, date, time. Aware the have labs + CXR no later than Dec 10.

## 2017-07-20 NOTE — Telephone Encounter (Signed)
New message ° °Pt verbalized that he is calling for the rn °

## 2017-07-21 ENCOUNTER — Ambulatory Visit
Admission: RE | Admit: 2017-07-21 | Discharge: 2017-07-21 | Disposition: A | Discharge status: Home / Self Care | Payer: Medicare Other | Source: Ambulatory Visit | Attending: Internal Medicine | Admitting: Internal Medicine

## 2017-07-21 DIAGNOSIS — Z01818 Encounter for other preprocedural examination: Secondary | ICD-10-CM

## 2017-07-22 ENCOUNTER — Other Ambulatory Visit: Payer: Self-pay | Admitting: *Deleted

## 2017-07-22 DIAGNOSIS — R079 Chest pain, unspecified: Secondary | ICD-10-CM

## 2017-07-22 DIAGNOSIS — I350 Nonrheumatic aortic (valve) stenosis: Secondary | ICD-10-CM

## 2017-07-22 DIAGNOSIS — Z951 Presence of aortocoronary bypass graft: Secondary | ICD-10-CM

## 2017-07-22 LAB — PROTIME-INR
INR: 1.1 (ref 0.8–1.2)
Prothrombin Time: 11.2 s (ref 9.1–12.0)

## 2017-07-22 LAB — BASIC METABOLIC PANEL
BUN / CREAT RATIO: 18 (ref 10–24)
BUN: 24 mg/dL (ref 8–27)
CHLORIDE: 108 mmol/L — AB (ref 96–106)
CO2: 22 mmol/L (ref 20–29)
CREATININE: 1.31 mg/dL — AB (ref 0.76–1.27)
Calcium: 9 mg/dL (ref 8.6–10.2)
GFR calc Af Amer: 59 mL/min/{1.73_m2} — ABNORMAL LOW (ref >59)
GFR calc non Af Amer: 51 mL/min/{1.73_m2} — ABNORMAL LOW (ref >59)
GLUCOSE: 98 mg/dL (ref 65–99)
Potassium: 5 mmol/L (ref 3.5–5.2)
SODIUM: 141 mmol/L (ref 134–144)

## 2017-07-22 LAB — CBC
HEMOGLOBIN: 11.2 g/dL — AB (ref 13.0–17.7)
Hematocrit: 34.2 % — ABNORMAL LOW (ref 37.5–51.0)
MCH: 29.9 pg (ref 26.6–33.0)
MCHC: 32.7 g/dL (ref 31.5–35.7)
MCV: 91 fL (ref 79–97)
PLATELETS: 158 10*3/uL (ref 150–379)
RBC: 3.74 x10E6/uL — AB (ref 4.14–5.80)
RDW: 14.5 % (ref 12.3–15.4)
WBC: 5.3 10*3/uL (ref 3.4–10.8)

## 2017-07-22 LAB — TSH: TSH: 13.4 u[IU]/mL — AB (ref 0.450–4.500)

## 2017-07-22 LAB — APTT: APTT: 29 s (ref 24–33)

## 2017-07-23 ENCOUNTER — Encounter (INDEPENDENT_AMBULATORY_CARE_PROVIDER_SITE_OTHER): Payer: Medicare Other | Admitting: Ophthalmology

## 2017-07-29 ENCOUNTER — Telehealth: Payer: Self-pay

## 2017-07-29 NOTE — Telephone Encounter (Signed)
Spoke with Pt wife per DPR:  Patient contacted pre-catheterization at [facility] scheduled for:  07/30/2017 @ 0730 Verified arrival time and place:  NT @ 0530 Confirmed AM meds to be taken pre-cath with sip of water: Take ASA/Plavix Addl concerns:  Left this nurse name and # for call back if any further questions

## 2017-07-30 ENCOUNTER — Encounter (HOSPITAL_COMMUNITY): Payer: Self-pay | Admitting: Interventional Cardiology

## 2017-07-30 ENCOUNTER — Encounter (HOSPITAL_COMMUNITY)
Admission: RE | Disposition: A | Discharge status: Home / Self Care | Payer: Self-pay | Source: Ambulatory Visit | Attending: Interventional Cardiology

## 2017-07-30 ENCOUNTER — Ambulatory Visit (HOSPITAL_COMMUNITY)
Admission: RE | Admit: 2017-07-30 | Discharge: 2017-07-30 | Disposition: A | Discharge status: Home / Self Care | Payer: Medicare Other | Source: Ambulatory Visit | Attending: Interventional Cardiology | Admitting: Interventional Cardiology

## 2017-07-30 DIAGNOSIS — I129 Hypertensive chronic kidney disease with stage 1 through stage 4 chronic kidney disease, or unspecified chronic kidney disease: Secondary | ICD-10-CM | POA: Diagnosis not present

## 2017-07-30 DIAGNOSIS — T82858A Stenosis of vascular prosthetic devices, implants and grafts, initial encounter: Secondary | ICD-10-CM | POA: Diagnosis not present

## 2017-07-30 DIAGNOSIS — I272 Pulmonary hypertension, unspecified: Secondary | ICD-10-CM | POA: Diagnosis not present

## 2017-07-30 DIAGNOSIS — I350 Nonrheumatic aortic (valve) stenosis: Secondary | ICD-10-CM

## 2017-07-30 DIAGNOSIS — R0789 Other chest pain: Secondary | ICD-10-CM | POA: Diagnosis present

## 2017-07-30 DIAGNOSIS — Z951 Presence of aortocoronary bypass graft: Secondary | ICD-10-CM | POA: Insufficient documentation

## 2017-07-30 DIAGNOSIS — Z8673 Personal history of transient ischemic attack (TIA), and cerebral infarction without residual deficits: Secondary | ICD-10-CM | POA: Insufficient documentation

## 2017-07-30 DIAGNOSIS — I739 Peripheral vascular disease, unspecified: Secondary | ICD-10-CM | POA: Diagnosis not present

## 2017-07-30 DIAGNOSIS — I447 Left bundle-branch block, unspecified: Secondary | ICD-10-CM | POA: Diagnosis present

## 2017-07-30 DIAGNOSIS — E785 Hyperlipidemia, unspecified: Secondary | ICD-10-CM | POA: Diagnosis not present

## 2017-07-30 DIAGNOSIS — R079 Chest pain, unspecified: Secondary | ICD-10-CM

## 2017-07-30 DIAGNOSIS — I25118 Atherosclerotic heart disease of native coronary artery with other forms of angina pectoris: Secondary | ICD-10-CM | POA: Diagnosis not present

## 2017-07-30 DIAGNOSIS — N183 Chronic kidney disease, stage 3 (moderate): Secondary | ICD-10-CM | POA: Insufficient documentation

## 2017-07-30 DIAGNOSIS — Z950 Presence of cardiac pacemaker: Secondary | ICD-10-CM | POA: Diagnosis not present

## 2017-07-30 DIAGNOSIS — I2582 Chronic total occlusion of coronary artery: Secondary | ICD-10-CM | POA: Insufficient documentation

## 2017-07-30 DIAGNOSIS — I25708 Atherosclerosis of coronary artery bypass graft(s), unspecified, with other forms of angina pectoris: Secondary | ICD-10-CM | POA: Insufficient documentation

## 2017-07-30 DIAGNOSIS — Z7982 Long term (current) use of aspirin: Secondary | ICD-10-CM | POA: Insufficient documentation

## 2017-07-30 DIAGNOSIS — Y832 Surgical operation with anastomosis, bypass or graft as the cause of abnormal reaction of the patient, or of later complication, without mention of misadventure at the time of the procedure: Secondary | ICD-10-CM | POA: Diagnosis not present

## 2017-07-30 DIAGNOSIS — Z79899 Other long term (current) drug therapy: Secondary | ICD-10-CM | POA: Diagnosis not present

## 2017-07-30 DIAGNOSIS — I2510 Atherosclerotic heart disease of native coronary artery without angina pectoris: Secondary | ICD-10-CM | POA: Diagnosis not present

## 2017-07-30 DIAGNOSIS — R9439 Abnormal result of other cardiovascular function study: Secondary | ICD-10-CM | POA: Diagnosis present

## 2017-07-30 DIAGNOSIS — Z955 Presence of coronary angioplasty implant and graft: Secondary | ICD-10-CM | POA: Insufficient documentation

## 2017-07-30 HISTORY — PX: RIGHT/LEFT HEART CATH AND CORONARY/GRAFT ANGIOGRAPHY: CATH118267

## 2017-07-30 LAB — POCT I-STAT 3, VENOUS BLOOD GAS (G3P V)
Acid-base deficit: 5 mmol/L — ABNORMAL HIGH (ref 0.0–2.0)
Bicarbonate: 21.1 mmol/L (ref 20.0–28.0)
O2 Saturation: 66 %
PCO2 VEN: 41.5 mmHg — AB (ref 44.0–60.0)
PH VEN: 7.315 (ref 7.250–7.430)
PO2 VEN: 38 mmHg (ref 32.0–45.0)
TCO2: 22 mmol/L (ref 22–32)

## 2017-07-30 LAB — POCT I-STAT 3, ART BLOOD GAS (G3+)
Acid-base deficit: 5 mmol/L — ABNORMAL HIGH (ref 0.0–2.0)
BICARBONATE: 20.2 mmol/L (ref 20.0–28.0)
O2 Saturation: 97 %
PCO2 ART: 37.8 mmHg (ref 32.0–48.0)
PH ART: 7.336 — AB (ref 7.350–7.450)
TCO2: 21 mmol/L — AB (ref 22–32)
pO2, Arterial: 94 mmHg (ref 83.0–108.0)

## 2017-07-30 LAB — POCT ACTIVATED CLOTTING TIME: ACTIVATED CLOTTING TIME: 181 s

## 2017-07-30 LAB — GLUCOSE, CAPILLARY: Glucose-Capillary: 122 mg/dL — ABNORMAL HIGH (ref 65–99)

## 2017-07-30 SURGERY — RIGHT/LEFT HEART CATH AND CORONARY/GRAFT ANGIOGRAPHY
Anesthesia: LOCAL

## 2017-07-30 MED ORDER — OXYCODONE HCL 5 MG PO TABS: 5.0000 mg | ORAL_TABLET | ORAL | Status: DC | PRN

## 2017-07-30 MED ORDER — SODIUM CHLORIDE 0.9% FLUSH: 3.0000 mL | INTRAVENOUS | Status: DC | PRN

## 2017-07-30 MED ORDER — MIDAZOLAM HCL 2 MG/2ML IJ SOLN
INTRAMUSCULAR | Status: AC
  Filled 2017-07-30: qty 2

## 2017-07-30 MED ORDER — ASPIRIN 81 MG PO CHEW: 81.0000 mg | CHEWABLE_TABLET | ORAL | Status: DC

## 2017-07-30 MED ORDER — IOPAMIDOL (ISOVUE-300) INJECTION 61%
INTRAVENOUS | Status: AC
  Filled 2017-07-30: qty 50

## 2017-07-30 MED ORDER — ONDANSETRON HCL 4 MG/2ML IJ SOLN: 4.0000 mg | Freq: Four times a day (QID) | INTRAMUSCULAR | Status: DC | PRN

## 2017-07-30 MED ORDER — HEPARIN (PORCINE) IN NACL 2-0.9 UNIT/ML-% IJ SOLN
INTRAMUSCULAR | Status: AC
  Filled 2017-07-30: qty 1000

## 2017-07-30 MED ORDER — MIDAZOLAM HCL 2 MG/2ML IJ SOLN
INTRAMUSCULAR | Status: DC | PRN
  Administered 2017-07-30: 1 mg via INTRAVENOUS

## 2017-07-30 MED ORDER — SODIUM CHLORIDE 0.9 % IV SOLN
INTRAVENOUS | Status: DC
  Administered 2017-07-30: 07:00:00 via INTRAVENOUS

## 2017-07-30 MED ORDER — LIDOCAINE HCL (PF) 1 % IJ SOLN
INTRAMUSCULAR | Status: DC | PRN
  Administered 2017-07-30: 18 mL

## 2017-07-30 MED ORDER — IOPAMIDOL (ISOVUE-370) INJECTION 76%
INTRAVENOUS | Status: DC | PRN
  Administered 2017-07-30: 125 mL

## 2017-07-30 MED ORDER — SODIUM CHLORIDE 0.9% FLUSH
3.0000 mL | Freq: Two times a day (BID) | INTRAVENOUS | Status: DC
Start: 2017-07-30 — End: 2017-07-30

## 2017-07-30 MED ORDER — IOPAMIDOL (ISOVUE-370) INJECTION 76%
INTRAVENOUS | Status: AC
  Filled 2017-07-30: qty 100

## 2017-07-30 MED ORDER — FENTANYL CITRATE (PF) 100 MCG/2ML IJ SOLN
INTRAMUSCULAR | Status: AC
  Filled 2017-07-30: qty 2

## 2017-07-30 MED ORDER — VERAPAMIL HCL 2.5 MG/ML IV SOLN
INTRAVENOUS | Status: AC
  Filled 2017-07-30: qty 2

## 2017-07-30 MED ORDER — SODIUM CHLORIDE 0.9 % IV SOLN: INTRAVENOUS | Status: AC

## 2017-07-30 MED ORDER — FENTANYL CITRATE (PF) 100 MCG/2ML IJ SOLN
INTRAMUSCULAR | Status: DC | PRN
  Administered 2017-07-30: 50 ug via INTRAVENOUS

## 2017-07-30 MED ORDER — LIDOCAINE HCL (PF) 1 % IJ SOLN
INTRAMUSCULAR | Status: AC
  Filled 2017-07-30: qty 30

## 2017-07-30 MED ORDER — SODIUM CHLORIDE 0.9 % IV SOLN: 250.0000 mL | INTRAVENOUS | Status: DC | PRN

## 2017-07-30 MED ORDER — ACETAMINOPHEN 325 MG PO TABS: 650.0000 mg | ORAL_TABLET | ORAL | Status: DC | PRN

## 2017-07-30 MED ORDER — HEPARIN (PORCINE) IN NACL 2-0.9 UNIT/ML-% IJ SOLN
INTRAMUSCULAR | Status: DC | PRN
  Administered 2017-07-30: 07:00:00

## 2017-07-30 MED ORDER — HEPARIN SODIUM (PORCINE) 1000 UNIT/ML IJ SOLN
INTRAMUSCULAR | Status: AC
  Filled 2017-07-30: qty 1

## 2017-07-30 SURGICAL SUPPLY — 16 items
CATH INFINITI 5 FR IM (CATHETERS) ×1 IMPLANT
CATH INFINITI 5FR JL4 (CATHETERS) ×1 IMPLANT
CATH INFINITI 5FR MPB2 (CATHETERS) ×2 IMPLANT
CATH SWAN GANZ 7F STRAIGHT (CATHETERS) ×1 IMPLANT
COVER PRB 48X5XTLSCP FOLD TPE (BAG) IMPLANT
COVER PROBE 5X48 (BAG) ×2
KIT HEART LEFT (KITS) ×2 IMPLANT
PACK CARDIAC CATHETERIZATION (CUSTOM PROCEDURE TRAY) ×2 IMPLANT
SHEATH PINNACLE 5F 10CM (SHEATH) ×1 IMPLANT
SHEATH PINNACLE 6F 10CM (SHEATH) IMPLANT
SHEATH PINNACLE 7F 10CM (SHEATH) ×1 IMPLANT
TRANSDUCER W/STOPCOCK (MISCELLANEOUS) ×2 IMPLANT
TUBING CIL FLEX 10 FLL-RA (TUBING) ×2 IMPLANT
WIRE EMERALD 3MM-J .025X260CM (WIRE) ×1 IMPLANT
WIRE EMERALD 3MM-J .035X150CM (WIRE) ×1 IMPLANT
WIRE EMERALD ST .035X150CM (WIRE) ×1 IMPLANT

## 2017-07-30 NOTE — Interval H&P Note (Signed)
Cath Lab Visit (complete for each Cath Lab visit)  Clinical Evaluation Leading to the Procedure:   ACS: No.  Non-ACS:    Anginal Classification: CCS [name]  Anti-ischemic medical therapy: Maximal Therapy (2 or more classes of medications)  Non-Invasive Test Results: No non-invasive testing performed  Prior CABG: Previous CABG      History and Physical Interval Note:  07/30/2017 7:33 AM  [name]  has presented today for surgery, with the diagnosis of chest pain, as  The various methods of treatment have been discussed with the patient and family. After consideration of risks, benefits and other options for treatment, the patient has consented to  Procedure(s): RIGHT/LEFT HEART CATH AND CORONARY/GRAFT ANGIOGRAPHY (N/A) as a surgical intervention .  The patient's history has been reviewed, patient examined, no change in status, stable for surgery.  I have reviewed the patient's chart and labs.  Questions were answered to the patient's satisfaction.     [name]

## 2017-07-30 NOTE — Discharge Instructions (Signed)

## 2017-07-30 NOTE — Progress Notes (Signed)
Site area: Right groin a 5 french arterial and 7 french venous sheath was removed  Site Prior to Removal:  Level 0  Pressure Applied For 20 MINUTES    Bedrest Beginning at 0925am  Manual:   Yes.    Patient Status During Pull:  stable  Post Pull Groin Site:  Level 0  Post Pull Instructions Given:  Yes.    Post Pull Pulses Present:  Yes.    Dressing Applied:  Yes.    Comments:  VS remain stable

## 2017-07-31 ENCOUNTER — Other Ambulatory Visit: Payer: Self-pay | Admitting: *Deleted

## 2017-07-31 DIAGNOSIS — Z951 Presence of aortocoronary bypass graft: Secondary | ICD-10-CM

## 2017-07-31 DIAGNOSIS — I350 Nonrheumatic aortic (valve) stenosis: Secondary | ICD-10-CM

## 2017-08-05 ENCOUNTER — Encounter (INDEPENDENT_AMBULATORY_CARE_PROVIDER_SITE_OTHER): Payer: Medicare Other | Admitting: Ophthalmology

## 2017-08-05 DIAGNOSIS — I10 Essential (primary) hypertension: Secondary | ICD-10-CM | POA: Diagnosis not present

## 2017-08-05 DIAGNOSIS — H34831 Tributary (branch) retinal vein occlusion, right eye, with macular edema: Secondary | ICD-10-CM

## 2017-08-05 DIAGNOSIS — H43813 Vitreous degeneration, bilateral: Secondary | ICD-10-CM

## 2017-08-05 DIAGNOSIS — H35033 Hypertensive retinopathy, bilateral: Secondary | ICD-10-CM | POA: Diagnosis not present

## 2017-08-10 DIAGNOSIS — I2510 Atherosclerotic heart disease of native coronary artery without angina pectoris: Secondary | ICD-10-CM | POA: Insufficient documentation

## 2017-08-10 DIAGNOSIS — I639 Cerebral infarction, unspecified: Secondary | ICD-10-CM | POA: Insufficient documentation

## 2017-08-10 DIAGNOSIS — Z9861 Coronary angioplasty status: Secondary | ICD-10-CM | POA: Insufficient documentation

## 2017-08-10 DIAGNOSIS — R0602 Shortness of breath: Secondary | ICD-10-CM | POA: Insufficient documentation

## 2017-08-10 DIAGNOSIS — I10 Essential (primary) hypertension: Secondary | ICD-10-CM | POA: Insufficient documentation

## 2017-08-10 DIAGNOSIS — I499 Cardiac arrhythmia, unspecified: Secondary | ICD-10-CM | POA: Insufficient documentation

## 2017-08-10 DIAGNOSIS — I739 Peripheral vascular disease, unspecified: Secondary | ICD-10-CM | POA: Insufficient documentation

## 2017-08-10 DIAGNOSIS — N189 Chronic kidney disease, unspecified: Secondary | ICD-10-CM | POA: Insufficient documentation

## 2017-08-12 ENCOUNTER — Other Ambulatory Visit: Payer: Self-pay

## 2017-08-12 ENCOUNTER — Encounter: Payer: Self-pay | Admitting: Surgery

## 2017-08-12 ENCOUNTER — Institutional Professional Consult (permissible substitution): Payer: Medicare Other | Admitting: Surgery

## 2017-08-12 VITALS — BP 115/62 | HR 58 | Resp 18 | Ht 71.5 in | Wt 188.4 lb

## 2017-08-12 DIAGNOSIS — I350 Nonrheumatic aortic (valve) stenosis: Secondary | ICD-10-CM

## 2017-08-14 ENCOUNTER — Telehealth: Payer: Self-pay | Admitting: Internal Medicine

## 2017-08-14 NOTE — Telephone Encounter (Signed)
Patient called. He reports he had an appt with Dr. Cyndia Bent on 12/26. Patient states Dr. Cyndia Bent was supposed to talk with Dr. Cyndia Bent for consultation to review all his "stuff" to determine the next steps. Patient aware MD has been on vacation but I will notify him of this update. Patient also wishes to move his cath follow up from 12/31 to a later date, so that Dr. [name]/Dr. Cyndia Bent have time to determine best course of action for patient.   R/s appt for Jan 11  Routed to MD as Juluis Rainier

## 2017-08-16 ENCOUNTER — Encounter: Payer: Self-pay | Admitting: Surgery

## 2017-08-16 NOTE — Progress Notes (Signed)
Patient ID: [name], male   DOB: 1933/08/31, 83 y.o.   MRN: [id number]  [facility]ATION REPORT  Referring Provider is [name], Nadean Corwin, MD PCP is Deland Pretty, MD  Chief Complaint  Patient presents with  . New Patient (Initial Visit)    Aortic stenosis, cath 07/30/2017    HPI:  The patient is an active 83 year old gentleman with a history of hypertension, coronary artery disease status post coronary artery bypass graft surgery in 1996, carotid artery disease status post bilateral carotid endarterectomies in 2008, previous stroke in 2008, and stage III chronic kidney disease.  He underwent multiple re-interventions on the ostium of the right coronary artery in 2013 for restenosis with recurrent chest discomfort.  He has done well over the years since then but was diagnosed with aortic stenosis that has been followed by echocardiogram.  He has been followed by Dr. Debara Pickett and reports that over the past several months he has had increasing shortness of breath, fatigue, and some left-sided chest discomfort.  This does not happen with his normal daily activities but does with any significant exertion.  His symptoms have always been relieved with rest.  He has had no symptoms at rest.  He denies orthopnea and PND.  He has had no dizziness or syncope.  His most recent echocardiogram on 05/07/2017 showed a trileaflet aortic valve with severe calcification of the leaflets and a mean transvalvular gradient of 31 mmHg.  The peak gradient was 56 mmHg.  The peak velocity ratio was 0.29.  The aortic valve area was measured at 1.1 cm.  The left ventricular ejection fraction was 55-60% with grade 1 diastolic dysfunction.  He underwent cardiac catheterization on 07/30/2017 which showed severe native vessel coronary disease with total occlusion of the LAD and left circumflex coronary arteries.  There is 75% stenosis in the distal  left main and ostium of the LAD within the previously placed stent.  There was a widely patent left internal mammary graft to the LAD.  There was a patent vein graft to the first obtuse marginal and a patent vein graft to the distal obtuse marginal.  The right coronary artery was stented from the ostium to the proximal portion.  There was some overhang of the ostial stent into the right sinus of Valsalva preventing selective engagement.  The right coronary artery had an eccentric 50-70% mid vessel stenosis as well as 70% distal stenosis.  There is 85% stenosis in the posterior lateral branch.  The mean aortic transvalvular gradient was 30 mmHg with a calculated aortic valve area of 0.95 cm.  Right heart pressures were normal.  The patient remains physically active but has decreased his activity to avoid having his symptoms.  He continues to go deer hunting.  Past Medical History:  Diagnosis Date  . Abnormal cardiovascular stress test, 11/2011 wint inferolateral reveribility 12/22/2011  . Angina effort, 12/2011 with DOE anginal equivlent 12/22/2011  . Blood transfusion    "  no reaction to transfusion "  . CAD (coronary artery disease), CABG 1996. 09/23/2011   2D Echo - EF >55%, moderate calcification of the aortic valve leaflets  . Carotid artery disease, Rt. CEA, 08/04/07, Lt. CEA 04/26/07. 12/23/2011  . Chronic kidney disease   . CKD (chronic kidney disease) stage 2, GFR 60-89 ml/min 09/16/2011  . Coronary artery disease   . Crescendo angina ([place]) 06/09/2012  . CVA (cerebral vascular accident) ([place]) 04/22/2007  2D Echo - EF 50-55%, aortic valve thickness mild-moderately increased,  . Dyspnea on exertion, anginal equivilant 10/16/2010   2D Echo - EF >55%, mild-moderate mitral regurgiation, mild-moderate tricuspid regurgitation, moderate calcification of the aortic leaflets  . Dysrhythmia    LLB  anf leaking valve per patient  . HTN (hypertension),uncontrolled 09/16/2011  . Hypertension   . Peripheral  arterial disease ([place])   . Pulmonary hypertension, mild 09/17/2011  . Recurrent angina status post rotational atherectomy, with PTCA for ISR to RCA, 12/22/2011 12/22/2011  . Shortness of breath   . Stroke ([place])    hx of TIA    Past Surgical History:  Procedure Laterality Date  .  Fluid knee Right Sept. 6, 2014   Fluid removed  . CARDIAC CATHETERIZATION Left 06/10/2012   Ostial RCA 99% stenosis pre-dilation 3x30mm Angiosculpt PTCA balloon, 3.5x42mm Promus Element DES overlapping proximal stent and into the Aorto-ostium, post-dilated with4x76mm Oakley Trek balloon - 99% stenosis reduced to 0%  . CARDIAC CATHETERIZATION Left 12/22/2011   Left Main-diffuse tapering 50-60% stenosis; mild-moderate diseaseof the right ventricle branch of the right coronary artery; will plan FFR of ostial RCA  . CARDIAC CATHETERIZATION Bilateral 09/16/2011   Severe 90% ostial RCA disease which is calcified, will likely need PCI to ostial RCA with calcification  . CARDIAC CATHETERIZATION  09/18/2011   Calcified ostial 90-95% proximal RCA stenosis, 3x27mm Emerge balloon predilation, 3x47mm DES PROMUS Element stent was placed, post stent dilation done utilizing a 3.25x41mm noncompliant Quantum Apex balloon, 90-95% stenosis reduced to 0%  . CEA  2008  . CORONARY ANGIOPLASTY    . CORONARY ARTERY BYPASS GRAFT  08/04/2007  . FRACTIONAL FLOW RESERVE WIRE  12/22/2011   Ostial RCA, FFR ratio-0.82, physiologically significant, 3.25x81mm Flextome Cutting balloon deployed at ~3.45mm final estimated diameter  . FRACTIONAL FLOW RESERVE WIRE Right 12/22/2011   Procedure: FRACTIONAL FLOW RESERVE WIRE;  Surgeon: Pixie Casino, MD;  Location: [facility] CATH LAB;  Service: Cardiovascular;  Laterality: Right;  . LEFT AND RIGHT HEART CATHETERIZATION WITH CORONARY ANGIOGRAM Left 09/16/2011   Procedure: LEFT AND RIGHT HEART CATHETERIZATION WITH CORONARY ANGIOGRAM;  Surgeon: Pixie Casino, MD;  Location: [facility] CATH LAB;  Service: Cardiovascular;  Laterality:  Left;  . LEFT HEART CATHETERIZATION WITH CORONARY ANGIOGRAM N/A 06/10/2012   Procedure: LEFT HEART CATHETERIZATION WITH CORONARY ANGIOGRAM;  Surgeon: Pixie Casino, MD;  Location: [facility] CATH LAB;  Service: Cardiovascular;  Laterality: N/A;  . LEFT HEART CATHETERIZATION WITH CORONARY/GRAFT ANGIOGRAM N/A 12/22/2011   Procedure: LEFT HEART CATHETERIZATION WITH Beatrix Fetters;  Surgeon: Pixie Casino, MD;  Location: [facility] C[facility] LAB;  Service: Cardiovascular;  Laterality: N/A;  . PERCUTANEOUS CORONARY ROTOBLATOR INTERVENTION (PCI-R)  09/18/2011   Procedure: PERCUTANEOUS CORONARY ROTOBLATOR INTERVENTION (PCI-R);  Surgeon: Troy Sine, MD;  Location: [facility] CATH LAB;  Service: Cardiovascular;;  . PERCUTANEOUS CORONARY STENT INTERVENTION (PCI-S) N/A 09/18/2011   Procedure: PERCUTANEOUS CORONARY STENT INTERVENTION (PCI-S);  Surgeon: Troy Sine, MD;  Location: [facility] CATH LAB;  Service: Cardiovascular;  Laterality: N/A;  . RIGHT/LEFT HEART CATH AND CORONARY/GRAFT ANGIOGRAPHY N/A 07/30/2017   Procedure: RIGHT/LEFT HEART CATH AND CORONARY/GRAFT ANGIOGRAPHY;  Surgeon: Belva Crome, MD;  Location: [place] CV LAB;  Service: Cardiovascular;  Laterality: N/A;  . TEMPORARY PACEMAKER INSERTION  09/18/2011   Procedure: TEMPORARY PACEMAKER INSERTION;  Surgeon: Troy Sine, MD;  Location: [facility] CATH LAB;  Service: Cardiovascular;;    Family History  Problem Relation Age of Onset  . Cancer Mother   .  Heart disease Mother   . Stroke Mother   . Alcohol abuse Father   . Depression Father   . Alzheimer's disease Father   . Heart disease Brother        Heart bypass in 1996  . Cancer Brother        Prostate  . Cancer Sister     Social History   Socioeconomic History  . Marital status: Married    Spouse name: Not on file  . Number of children: Not on file  . Years of education: Not on file  . Highest education level: Not on file  Social Needs  . Financial resource strain: Not on file  . Food  insecurity - worry: Not on file  . Food insecurity - inability: Not on file  . Transportation needs - medical: Not on file  . Transportation needs - non-medical: Not on file  Occupational History  . Not on file  Tobacco Use  . Smoking status: Never Smoker  . Smokeless tobacco: Never Used  . Tobacco comment: rarely smoked when he did smoke   Substance and Sexual Activity  . Alcohol use: No    Alcohol/week: 0.0 oz  . Drug use: No  . Sexual activity: Not Currently  Other Topics Concern  . Not on file  Social History Narrative  . Not on file    Current Outpatient Medications  Medication Sig Dispense Refill  . amLODipine (NORVASC) 10 MG tablet Take 1 tablet (10 mg total) by mouth at bedtime. 30 tablet 6  . aspirin EC 81 MG tablet Take 81 mg by mouth at bedtime.     . clopidogrel (PLAVIX) 75 MG tablet Take 1 tablet (75 mg total) by mouth daily. (Patient taking differently: Take 75 mg by mouth at bedtime. ) 90 tablet 3  . fish oil-omega-3 fatty acids 1000 MG capsule Take 1 g by mouth 2 (two) times daily.     . isosorbide mononitrate (IMDUR) 30 MG 24 hr tablet Take 30 mg by mouth 3 (three) times daily.    . metoprolol tartrate (LOPRESSOR) 25 MG tablet Take 25 mg by mouth 2 (two) times daily.    . niacin (NIASPAN) 500 MG CR tablet Take 500 mg by mouth at bedtime.    . nitroGLYCERIN (NITROSTAT) 0.4 MG SL tablet Place 1 tablet (0.4 mg total) under the tongue every 5 (five) minutes x 3 doses as needed. 25 tablet 3  . rosuvastatin (CRESTOR) 40 MG tablet Take 13.3333 mg by mouth at bedtime. Take 1/3 tablet by mouth daily.      No current facility-administered medications for this visit.    Facility-Administered Medications Ordered in Other Visits  Medication Dose Route Frequency Provider Last Rate Last Dose  . sodium chloride 0.9 % injection 3 mL  3 mL Intravenous PRN [name], Nadean Corwin, MD        Allergies  Allergen Reactions  . Atorvastatin Other (See Comments)    Muscle and joint pain,  can tolerate rosuvastatin      Review of Systems:   General:  normal appetite, normal energy, no weight gain, no weight loss, no fever  Cardiac:  has chest pain with exertion, no chest pain at rest, has SOB with moderate exertion, no resting SOB, no PND, no orthopnea, no palpitations, no arrhythmia, no atrial fibrillation, no LE edema, no dizzy spells, no syncope  Respiratory:  exertional shortness of breath, no home oxygen, no productive cough, no dry cough, no bronchitis, no wheezing, no hemoptysis,  no asthma, no pain with inspiration or cough, no sleep apnea, no CPAP at night  GI:   no difficulty swallowing, no reflux, no frequent heartburn, no hiatal hernia, no abdominal pain, no constipation, no diarrhea, no hematochezia, no hematemesis, no melena  GU:   no dysuria,  no frequency, no urinary tract infection, no hematuria, no enlarged prostate, no kidney stones, no kidney disease  Vascular:  no pain suggestive of claudication, no pain in feet, no leg cramps, no varicose veins, no DVT, no non-healing foot ulcer  Neuro:   remote stroke, no TIA's, no seizures, no headaches, no temporary blindness one eye,  no slurred speech, no peripheral neuropathy, no chronic pain, no instability of gait, no memory/cognitive dysfunction  Musculoskeletal: no arthritis, no joint swelling, no myalgias, no difficulty walking, normal mobility   Skin:   no rash, no itching, no skin infections, no pressure sores or ulcerations  Psych:   no anxiety, no depression, no nervousness, no unusual recent stress  Eyes:   no blurry vision, no floaters, no recent vision changes,  wears glasses or contacts  ENT:   no hearing loss, no loose or painful teeth, no dentures, last saw dentist recently  Hematologic:  no easy bruising, no abnormal bleeding, no clotting disorder, no frequent epistaxis  Endocrine:  no diabetes, does not check CBG's at home      Physical Exam:   BP 115/62 (BP Location: Right Arm, Patient Position:  Sitting, Cuff Size: Large)   Pulse (!) 58   Resp 18   Ht 5' 11.5" (1.816 m)   Wt 188 lb 6.4 oz (85.5 kg)   SpO2 98% Comment: on RA  BMI 25.91 kg/m   General:  Elderly but  well-appearing  HEENT:  Unremarkable, NCAT, PERLA, EOMI, oropharynx clear, teeth in fair condition.  Neck:   no JVD, no bruits, no adenopathy or thyromegaly  Chest:   clear to auscultation, symmetrical breath sounds, no wheezes, no rhonchi   CV:   RRR, grade III/VI crescendo/decrescendo murmur heard best at RSB,  no diastolic murmur  Abdomen:  soft, non-tender, no masses or organomegaly  Extremities:  warm, well-perfused, pulses palpable, no LE edema  Rectal/GU  Deferred  Neuro:   Grossly non-focal and symmetrical throughout  Skin:   Clean and dry, no rashes, no breakdown   Diagnostic Tests:   [facility]                        [address], [place] [address]                            [phone]  ------------------------------------------------------------------- Transthoracic Echocardiography  Patient:    Camarion, Weier MR #:       [id number] Study Date: 05/07/2017 Gender:     M Age:        56 Height:     176.5 cm Weight:     80.6 kg BSA:        2 m^2 Pt. Status: Room:   ATTENDING    Lyman Bishop MD  [place] MD  Casas Adobes MD  SONOGRAPHER  Marygrace Drought, RCS  PERFORMING   [facility], Outpatient  cc:  ------------------------------------------------------------------- LV EF: 55% -  60%  ------------------------------------------------------------------- Indications:      Aortic Valve Disease (I35.0).  ------------------------------------------------------------------- History:   PMH:  CKD, CVA.  Chest pain.  Dyspnea.  Risk factors: Hypertension.  ------------------------------------------------------------------- Study Conclusions  - Left ventricle: The cavity size was normal. Wall thickness was    increased in a pattern of moderate LVH. Systolic function was   normal. The estimated ejection fraction was in the range of 55%   to 60%. Wall motion was normal; there were no regional wall   motion abnormalities. Doppler parameters are consistent with   abnormal left ventricular relaxation (grade 1 diastolic   dysfunction). - Aortic valve: Trileaflet; severely calcified leaflets. There was   moderate stenosis. There was mild regurgitation. Mean gradient   (S): 31 mm Hg. Valve area (VTI): 1.13 cm^2. - Mitral valve: Mildly calcified annulus. There was mild   regurgitation. - Left atrium: The atrium was mildly dilated. - Right ventricle: The cavity size was normal. Systolic function   was normal. - Right atrium: The atrium was mildly dilated. - Tricuspid valve: Peak RV-RA gradient (S): 28 mm Hg. - Pulmonary arteries: PA peak pressure: 31 mm Hg (S). - Inferior vena cava: The vessel was normal in size. The   respirophasic diameter changes were in the normal range (= 50%),   consistent with normal central venous pressure.  Impressions:  - Normal LV size with moderate LV hypertrophy, EF 55-60%. Normal RV   size and systolic function. Moderate aortic stenosis with mild   aortic insufficiency. Mild mitral regurgitation.  ------------------------------------------------------------------- Study data:  Comparison was made to the study of 04/25/2016.  Study status:  Routine.  Procedure:  The patient reported no pain pre or post test. Transthoracic echocardiography. Image quality was adequate.          Transthoracic echocardiography.  M-mode, complete 2D, spectral Doppler, and color Doppler.  Birthdate: Patient birthdate: 1933/10/28.  Age:  Patient is 83 yr old.  Sex: Gender: male.    BMI: 25.9 kg/m^2.  Blood pressure:     124/68 Patient status:  Outpatient.  Study date:  Study date: 05/07/2017. Study time: 09:28 AM.  Location:  [facility]  -------------------------------------------------------------------  ------------------------------------------------------------------- Left ventricle:  The cavity size was normal. Wall thickness was increased in a pattern of moderate LVH. Systolic function was normal. The estimated ejection fraction was in the range of 55% to 60%. Wall motion was normal; there were no regional wall motion abnormalities. Doppler parameters are consistent with abnormal left ventricular relaxation (grade 1 diastolic dysfunction).  ------------------------------------------------------------------- Aortic valve:   Trileaflet; severely calcified leaflets.  Doppler:  There was moderate stenosis.   There was mild regurgitation. VTI ratio of LVOT to aortic valve: 0.32. Valve area (VTI): 1.13 cm^2. Indexed valve area (VTI): 0.57 cm^2/m^2. Peak velocity ratio of LVOT to aortic valve: 0.29. Valve area (Vmax): 1.04 cm^2. Indexed valve area (Vmax): 0.52 cm^2/m^2. Mean velocity ratio of LVOT to aortic valve: 0.33. Valve area (Vmean): 1.17 cm^2. Indexed valve area (Vmean): 0.59 cm^2/m^2.    Mean gradient (S): 31 mm Hg. Peak gradient (S): 56 mm Hg.  ------------------------------------------------------------------- Aorta:  Aortic root: The aortic root was normal in size. Ascending aorta: The ascending aorta was normal in size.  ------------------------------------------------------------------- Mitral valve:   Mildly calcified annulus.  Doppler:   There was no evidence for stenosis.   There was mild regurgitation.    Valve area by pressure half-time: 2.75 cm^2. Indexed valve area by pressure half-time: 1.38 cm^2/m^2.  Peak gradient (D): 3 mm Hg.   ------------------------------------------------------------------- Left atrium:  The atrium was mildly dilated.  ------------------------------------------------------------------- Right ventricle:  The cavity size was normal. Systolic function  was normal.  ------------------------------------------------------------------- Pulmonic valve:    Structurally normal valve.   Cusp separation was normal.  Doppler:  Transvalvular velocity was within the normal range. There was no regurgitation.  ------------------------------------------------------------------- Tricuspid valve:   Doppler:  There was mild regurgitation.  ------------------------------------------------------------------- Right atrium:  The atrium was mildly dilated.  ------------------------------------------------------------------- Pericardium:  There was no pericardial effusion.  ------------------------------------------------------------------- Systemic veins: Inferior vena cava: The vessel was normal in size. The respirophasic diameter changes were in the normal range (= 50%), consistent with normal central venous pressure. Diameter: 17 mm.  ------------------------------------------------------------------- Measurements   IVC                                      Value          Reference  ID                                       17    mm       ----------    Left ventricle                           Value          Reference  LV ID, ED, PLAX chordal          (L)     42    mm       43 - 52  LV ID, ES, PLAX chordal                  31    mm       23 - 38  LV fx shortening, PLAX chordal   (L)     26    %        >=29  LV PW thickness, ED                      9     mm       ----------  IVS/LV PW ratio, ED              (H)     1.78           <=1.3  Stroke volume, 2D                        93    ml       ----------  Stroke volume/bsa, 2D                    47    ml/m^2   ----------  LV e&', lateral                           6.96  cm/s     ----------  LV E/e&', lateral                         11.78          ----------  LV e&', medial  4.9   cm/s     ----------  LV E/e&', medial                          16.73          ----------   LV e&', average                           5.93  cm/s     ----------  LV E/e&', average                         13.83          ----------    Ventricular septum                       Value          Reference  IVS thickness, ED                        16    mm       ----------    LVOT                                     Value          Reference  LVOT ID, S                               21.3  mm       ----------  LVOT area                                3.56  cm^2     ----------  LVOT peak velocity, S                    109   cm/s     ----------  LVOT mean velocity, S                    80.9  cm/s     ----------  LVOT VTI, S                              29.5  cm       ----------  Stroke volume (SV), LVOT DP              105.1 ml       ----------  Stroke index (SV/bsa), LVOT DP           52.6  ml/m^2   ----------    Aortic valve                             Value          Reference  Aortic valve peak velocity, S            375   cm/s     ----------  Aortic valve mean velocity, S            246   cm/s     ----------  Aortic valve VTI, S  92.7  cm       ----------  Aortic mean gradient, S                  30    mm Hg    ----------  Aortic peak gradient, S                  56    mm Hg    ----------  VTI ratio, LVOT/AV                       0.32           ----------  Aortic valve area, VTI                   1.13  cm^2     ----------  Aortic valve area/bsa, VTI               0.57  cm^2/m^2 ----------  Velocity ratio, peak, LVOT/AV            0.29           ----------  Aortic valve area, peak velocity         1.04  cm^2     ----------  Aortic valve area/bsa, peak              0.52  cm^2/m^2 ----------  velocity  Velocity ratio, mean, LVOT/AV            0.33           ----------  Aortic valve area, mean velocity         1.17  cm^2     ----------  Aortic valve area/bsa, mean              0.59  cm^2/m^2 ----------  velocity    Aorta                                    Value           Reference  Aortic root ID, ED                       30    mm       ----------    Left atrium                              Value          Reference  LA ID, A-P, ES                           49    mm       ----------  LA ID/bsa, A-P                   (H)     2.45  cm/m^2   <=2.2  LA volume, S                             68.8  ml       ----------  LA volume/bsa, S                         34.4  ml/m^2   ----------  LA volume, ES, 1-p A4C                   67.1  ml       ----------  LA volume/bsa, ES, 1-p A4C               33.6  ml/m^2   ----------  LA volume, ES, 1-p A2C                   66.6  ml       ----------  LA volume/bsa, ES, 1-p A2C               33.3  ml/m^2   ----------    Mitral valve                             Value          Reference  Mitral E-wave peak velocity              82    cm/s     ----------  Mitral A-wave peak velocity              95.6  cm/s     ----------  Mitral deceleration time         (H)     275   ms       150 - 230  Mitral pressure half-time                80    ms       ----------  Mitral peak gradient, D                  3     mm Hg    ----------  Mitral E/A ratio, peak                   0.9            ----------  Mitral valve area, PHT, DP               2.75  cm^2     ----------  Mitral valve area/bsa, PHT, DP           1.38  cm^2/m^2 ----------    Pulmonary arteries                       Value          Reference  PA pressure, S, DP               (H)     31    mm Hg    <=30    Tricuspid valve                          Value          Reference  Tricuspid regurg peak velocity           263   cm/s     ----------  Tricuspid peak RV-RA gradient            28    mm Hg    ----------  Tricuspid maximal regurg                 263   cm/s     ----------  velocity, PISA    Right atrium  Value          Reference  RA ID, S-I, ES, A4C              (H)     61.2  mm       34 - 49  RA area, ES, A4C                 (H)     21.2  cm^2     8.3  - 19.5  RA volume, ES, A/L                       61    ml       ----------  RA volume/bsa, ES, A/L                   30.5  ml/m^2   ----------    Systemic veins                           Value          Reference  Estimated CVP                            3     mm Hg    ----------    Right ventricle                          Value          Reference  TAPSE                                    18.4  mm       ----------  RV s&', lateral, S                        12.4  cm/s     ----------  Legend: (L)  and  (H)  mark values outside specified reference range.  ------------------------------------------------------------------- Prepared and Electronically Authenticated by  Loralie Champagne, M.D. 2018-09-20T14:52:18  Physicians   Panel Physicians Referring Physician Case Authorizing Physician  Belva Crome, MD (Primary)    Procedures   RIGHT/LEFT HEART CATH AND CORONARY/GRAFT ANGIOGRAPHY  Conclusion    Severe native vessel coronary artery disease with total occlusion of the LAD and circumflex arteries.  75% stenosis in the distal left main ostial LAD within the previously placed stent.  Angiographic left main diameter is less than 2.5 mm.  Native right coronary is stented from the ostium to the proximal segment.  There appear to be overlapping stents.  There is overhang of the ostial stent into the right sinus of Valsalva preventing selective engagement.  The right coronary contains mid vessel eccentric 50-70% stenosis, distal 70% stenosis and 85% obstruction in the PL of the right coronary.  Widely patent bypass grafts including the saphenous vein graft to the first obtuse marginal, saphenous vein graft to the distal obtuse marginal, and the left internal mammary to the LAD.  Moderately severe aortic stenosis with a mean gradient of 30 mmHg, and a calculated aortic valve area of 0.95 cm square.  Normal right heart pressures.  Upper normal LV EDP.  RECOMMENDATIONS:   Continue  medical therapy including longitudinal assessment of aortic stenosis.  Current angina is  likely from the right coronary but unable to perform intervention due to previously stented ostium containing a stent that overhangs into the sinus of Valsalva.  This prevents selective engagement.   Indications   Coronary artery disease of bypass graft of native heart with stable angina pectoris (HCC) [I25.708 (ICD-10-CM)]  Nonrheumatic aortic valve stenosis [I35.0 (ICD-10-CM)]  Procedural Details/Technique   Technical Details The right femoral was sterilely prepped and draped. 1% Xylocaine local infiltration was then given for local analgesia. Sedation was administered in the form of intravenous Versed and fentanyl.. Using real-time vascular ultrasound, a Seldinger needle was then used to perform an anterior wall common femoral artery stick. A VUS image was saved for the permanent record. A 5 French sheath was inserted using the modified Seldinger technique. Coronary angiography was then performed using a 5 French B2 multipurpose, internal mammary catheter, and JL4. Left ventriculography was not performed. Hemodynamics were recorded after the internal mammary catheter was used to cross the stenotic aortic valve. A 0.035 straight wire was used within the IMA catheter for ventricular access. The left internal mammary graft was selectively engaged and visualized using the IMA catheter. The native right coronary could not be selectively engaged due to stent overhang into the right coronary sinus of Valsalva. An aortic O2 saturation was obtained with the B2 multipurpose catheter.  Right heart catheterization was performed from the right femoral. 1% Xylocaine was used to locally anesthetize the region. Real-time vascular ultrasound was used to localize the right femoral vein. A 7 French sheath was inserted using a modified Seldinger technique. Right heart pressures were recorded using a Swan-Ganz catheter. A pulmonary  artery O2 saturation was obtained from this catheter.   During this procedure the patient is administered a total of Versed 1 mg and Fentanyl 50 mg to achieve and maintain moderate conscious sedation. The patient's heart rate, blood pressure, and oxygen saturation are monitored continuously during the procedure. The period of conscious sedation is 56 minutes, of which I was present face-to-face 100% of this time.   Estimated blood loss <50 mL.  During this procedure the patient was administered the following to achieve and maintain moderate conscious sedation: Versed 1 mg, Fentanyl 50 mcg, while the patient's heart rate, blood pressure, and oxygen saturation were continuously monitored. The period of conscious sedation was 56 minutes, of which I was present face-to-face 100% of this time.  Coronary Findings   Diagnostic  Dominance: Right  Left Main  Dist LM to Ost LAD lesion 85% stenosed  Dist LM to Ost LAD lesion is 85% stenosed.  Left Anterior Descending  Ost LAD to Prox LAD lesion 50% stenosed  Ost LAD to Prox LAD lesion is 50% stenosed. The lesion was previously treated.  Prox LAD lesion 100% stenosed  Prox LAD lesion is 100% stenosed.  Left Circumflex  Ost Cx to Prox Cx lesion 100% stenosed  Ost Cx to Prox Cx lesion is 100% stenosed. The lesion was previously treated.  Prox Cx to Mid Cx lesion 100% stenosed  Prox Cx to Mid Cx lesion is 100% stenosed.  First Obtuse Marginal Branch  Ost 1st Mrg lesion 100% stenosed  Ost 1st Mrg lesion is 100% stenosed. The lesion was previously treated.  Right Coronary Artery  Ost RCA to Mid RCA lesion 40% stenosed  Ost RCA to Mid RCA lesion is 40% stenosed. The lesion was previously treated.  Mid RCA lesion 60% stenosed  Mid RCA lesion is 60% stenosed.  Dist RCA lesion 70% stenosed  Dist RCA lesion is 70% stenosed.  Right Posterior Atrioventricular Branch  Post Atrio lesion 85% stenosed  Post Atrio lesion is 85% stenosed.  LIMA Graft to Mid  LAD  Graft to 1st Mrg  Graft to 3rd Mrg  Intervention   No interventions have been documented.  Right Heart   Right Heart Pressures Hemodynamic findings consistent with pulmonary hypertension. LV EDP is normal.  Left Heart   Aortic Valve There is moderate aortic valve stenosis. The aortic valve is calcified. There is restricted aortic valve motion.  Coronary Diagrams   Diagnostic Diagram       Implants     No implant documentation for this case.  MERGE Images   Show images for CARDIAC CATHETERIZATION   Link to Procedure Log   Procedure Log    Hemo Data    Most Recent Value  Fick Cardiac Output 4.8 L/min  Fick Cardiac Output Index 2.42 (L/min)/BSA  Aortic Mean Gradient 30.3 mmHg  Aortic Peak Gradient 30 mmHg  Aortic Valve Area 0.95  Aortic Value Area Index 0.48 cm2/BSA  RA A Wave 6 mmHg  RA V Wave 6 mmHg  RA Mean 3 mmHg  RV Systolic Pressure 32 mmHg  RV Diastolic Pressure -5 mmHg  RV EDP 7 mmHg  PA Systolic Pressure 27 mmHg  PA Diastolic Pressure 8 mmHg  PA Mean 16 mmHg  PW A Wave 15 mmHg  PW V Wave 18 mmHg  PW Mean 11 mmHg  AO Systolic Pressure 121 mmHg  AO Diastolic Pressure 61 mmHg  AO Mean 89 mmHg  LV Systolic Pressure 975 mmHg  LV Diastolic Pressure 3 mmHg  LV EDP 18 mmHg  Arterial Occlusion Pressure Extended Systolic Pressure 883 mmHg  Arterial Occlusion Pressure Extended Diastolic Pressure 61 mmHg  Arterial Occlusion Pressure Extended Mean Pressure 90 mmHg  Left Ventricular Apex Extended Systolic Pressure 254 mmHg  Left Ventricular Apex Extended Diastolic Pressure 3 mmHg  Left Ventricular Apex Extended EDP Pressure 17 mmHg  QP/QS 1  TPVR Index 6.6 HRUI  TSVR Index 36.69 HRUI  PVR SVR Ratio 0.06  TPVR/TSVR Ratio 0.18    Impression:  This 83 year old gentleman has moderate to severe aortic stenosis and moderate right coronary artery stenoses with a several month history of progressive shortness of breath associated with some chest  discomfort.  His symptoms are likely due to a combination of the aortic stenosis as well as the right coronary artery disease.  His aortic valve mean gradient is 31 mmHg and is not changed from the echocardiogram in September 2017.  Clearly something has changed that is causing his recently progressive symptoms.  I think he would be at high risk for redo coronary bypass graft surgery to the right coronary territory and aortic valve replacement due to his advanced age, previous cardiac surgery, and stage III chronic kidney disease.  His right coronary stenoses are not felt to be amenable to PCI due to difficulty engaging the right coronary ostium due to the overhanging stent.  His aortic stenosis is still in the moderate range by mean gradient but could still be causing symptoms.  I think it would be worthwhile to discuss his case with the heart valve team before deciding on a course of treatment.  I reviewed the echocardiogram and catheterization studies with the patient and his wife and answered their questions.   Plan:  I will discuss his case with the heart valve team and will then see him back in the office to discuss the  treatment options further.   I spent 60 minutes performing this consultation and > 50% of this time was spent face to face counseling and coordinating the care of this patient's moderate to severe aortic stenosis and RCA stenoses.    Gaye Pollack, MD 08/12/2017

## 2017-08-17 ENCOUNTER — Ambulatory Visit: Payer: Medicare Other | Admitting: Internal Medicine

## 2017-08-25 ENCOUNTER — Other Ambulatory Visit: Payer: Self-pay

## 2017-08-25 ENCOUNTER — Telehealth: Payer: Self-pay | Admitting: Internal Medicine

## 2017-08-25 DIAGNOSIS — I350 Nonrheumatic aortic (valve) stenosis: Secondary | ICD-10-CM

## 2017-08-25 NOTE — Telephone Encounter (Signed)
Returned call to patient.  Patient states Dr. Cyndia Bent was to discuss his case with the valve team today and was going to contact Dr. Debara Pickett.  Advised I am unsure if this has been done as of this time but would recommend keeping appt for 1/11 with Dr. Debara Pickett.  patinet agrees and verbalized understanding.

## 2017-08-25 NOTE — Telephone Encounter (Signed)
New Message     Patient wants to know why he has to come in for this appointment?

## 2017-08-26 ENCOUNTER — Other Ambulatory Visit: Payer: Medicare Other | Admitting: *Deleted

## 2017-08-26 DIAGNOSIS — I350 Nonrheumatic aortic (valve) stenosis: Secondary | ICD-10-CM

## 2017-08-26 LAB — BASIC METABOLIC PANEL
BUN / CREAT RATIO: 17 (ref 10–24)
BUN: 25 mg/dL (ref 8–27)
CO2: 21 mmol/L (ref 20–29)
CREATININE: 1.45 mg/dL — AB (ref 0.76–1.27)
Calcium: 9 mg/dL (ref 8.6–10.2)
Chloride: 104 mmol/L (ref 96–106)
GFR calc Af Amer: 52 mL/min/{1.73_m2} — ABNORMAL LOW (ref >59)
GFR, EST NON AFRICAN AMERICAN: 45 mL/min/{1.73_m2} — AB (ref >59)
Glucose: 65 mg/dL (ref 65–99)
Potassium: 4.4 mmol/L (ref 3.5–5.2)
SODIUM: 139 mmol/L (ref 134–144)

## 2017-08-27 ENCOUNTER — Other Ambulatory Visit: Payer: Self-pay

## 2017-08-27 ENCOUNTER — Other Ambulatory Visit: Payer: Self-pay | Admitting: Physician Assistant

## 2017-08-27 DIAGNOSIS — N289 Disorder of kidney and ureter, unspecified: Secondary | ICD-10-CM

## 2017-08-27 DIAGNOSIS — I350 Nonrheumatic aortic (valve) stenosis: Secondary | ICD-10-CM

## 2017-08-28 ENCOUNTER — Encounter: Payer: Self-pay | Admitting: Thoracic Surgery (Cardiothoracic Vascular Surgery)

## 2017-08-28 ENCOUNTER — Institutional Professional Consult (permissible substitution) (INDEPENDENT_AMBULATORY_CARE_PROVIDER_SITE_OTHER): Payer: Medicare Other | Admitting: Thoracic Surgery (Cardiothoracic Vascular Surgery)

## 2017-08-28 ENCOUNTER — Ambulatory Visit (HOSPITAL_COMMUNITY)
Admission: RE | Admit: 2017-08-28 | Discharge: 2017-08-28 | Disposition: A | Discharge status: Home / Self Care | Payer: Medicare Other | Source: Ambulatory Visit | Attending: Surgery | Admitting: Surgery

## 2017-08-28 ENCOUNTER — Ambulatory Visit: Payer: Medicare Other | Admitting: Internal Medicine

## 2017-08-28 VITALS — BP 128/72 | HR 50 | Resp 20 | Ht 69.0 in | Wt 184.0 lb

## 2017-08-28 DIAGNOSIS — R590 Localized enlarged lymph nodes: Secondary | ICD-10-CM | POA: Insufficient documentation

## 2017-08-28 DIAGNOSIS — N400 Enlarged prostate without lower urinary tract symptoms: Secondary | ICD-10-CM | POA: Diagnosis not present

## 2017-08-28 DIAGNOSIS — I350 Nonrheumatic aortic (valve) stenosis: Secondary | ICD-10-CM

## 2017-08-28 DIAGNOSIS — Z951 Presence of aortocoronary bypass graft: Secondary | ICD-10-CM | POA: Insufficient documentation

## 2017-08-28 DIAGNOSIS — I517 Cardiomegaly: Secondary | ICD-10-CM | POA: Diagnosis not present

## 2017-08-28 DIAGNOSIS — K5730 Diverticulosis of large intestine without perforation or abscess without bleeding: Secondary | ICD-10-CM | POA: Diagnosis not present

## 2017-08-28 DIAGNOSIS — I700 Atherosclerosis of aorta: Secondary | ICD-10-CM | POA: Insufficient documentation

## 2017-08-28 DIAGNOSIS — I2510 Atherosclerotic heart disease of native coronary artery without angina pectoris: Secondary | ICD-10-CM | POA: Diagnosis not present

## 2017-08-28 DIAGNOSIS — D735 Infarction of spleen: Secondary | ICD-10-CM | POA: Insufficient documentation

## 2017-08-28 MED ORDER — IOPAMIDOL (ISOVUE-370) INJECTION 76%
INTRAVENOUS | Status: AC
  Administered 2017-08-28: 100 mL
  Filled 2017-08-28: qty 100

## 2017-08-28 NOTE — Progress Notes (Signed)
[facility]  MULTIDISCIPLINARY HEART VALVE CLINIC  CARDIOTHORACIC SURGERY CONSULTATION REPORT  Referring Provider is [name], Nadean Corwin, MD PCP is Deland Pretty, MD  Chief Complaint  Patient presents with  . Aortic Stenosis    2nd TAVER eval, review studies    HPI:  Patient is an 84 year old male with history of aortic stenosis, coronary artery disease status post coronary artery bypass grafting in 1996, status post PCI and stenting of the right coronary artery in 2013, cerebrovascular disease status post bilateral carotid endarterectomies with previous stroke in 2008, hypertension, and stage III chronic kidney disease who has been referred for second surgical opinion to discuss treatment options for management of severe symptomatic aortic stenosis and multivessel coronary artery disease.  The patient's cardiac history dates back to 1996 when he underwent coronary artery bypass grafting x3 by Dr. Cyndia Bent with grafts placed including left internal mammary artery to the distal left anterior descending coronary artery, saphenous vein graft to the first obtuse marginal branch, and saphenous vein graft to the second obtuse marginal branch.  The patient recovered uneventfully and did very well for a long period of time.  He developed symptoms of exertional angina in 2013 and underwent catheterization that revealed continued patency of all 3 previously placed bypass grafts but development of right coronary artery stenosis.  The right coronary system was not grafted at the time of the patient's original bypass surgery, and he was subsequently treated with PCI and stenting.  He developed restenosis requiring repeat PCI.  Since then he has done fairly well.  He was noted to have a heart murmur on physical exam and diagnosed with aortic stenosis that has progressed in severity over the last several years during which time the patient has been followed by Dr. Debara Pickett.  Most recent echocardiogram  performed May 07, 2017 revealed moderate to severe aortic stenosis with peak velocity across the aortic valve reported 3.8 m/s corresponding to mean transvalvular gradient estimated 30 mmHg.  The DVI was reported 0.32 corresponding to aortic valve area calculated between 1.04 and 1.13 cm.  Left ventricular systolic function remain normal.  Over the last few months patient developed progressive symptoms of exertional shortness of breath and chest tightness.  The patient was seen in follow-up by Dr. Debara Pickett and subsequently underwent diagnostic cardiac catheterization by Dr. Tamala Julian on July 30, 2017.  Findings were notable for severe multivessel coronary artery disease with chronic total occlusion of the left anterior descending coronary artery and left circumflex coronary arteries.  There was continued patency of the left internal mammary artery graft to the distal left anterior descending coronary artery, saphenous vein graft to the first obtuse marginal branch, and saphenous vein graft to the second obtuse marginal branch.  There was diffuse mild in-stent restenosis involving overlapping stents in the proximal right coronary artery with 50-70% eccentric stenosis in the mid right coronary artery and 85% stenosis in the posterolateral branch of the distal right coronary artery.  Mean transvalvular gradient across the aortic valve was measured 30 mmHg corresponding to aortic valve area calculated 0.95 cm.  There were normal right heart pressures.  The patient was referred for surgical consultation and has previously been evaluated by Dr. Cyndia Bent.  The patient was notably felt to be at least moderate risk for conventional surgical aortic valve replacement with redo coronary artery bypass grafting, and the amount of benefit related to redo coronary artery bypass grafting was felt to be marginal due to the presence of noncritical disease  and relatively small target vessels for grafting.  Transcatheter aortic valve  replacement was discussed as a much less invasive and potentially less risky means to treat the patient's aortic stenosis.  Since then CT angiography has been performed and second surgical consultation requested.  The patient is married and lives locally in [place] with his wife.  He has been retired for more than 20 years but remains quite active physically.  He enjoys hunting and fishing and spending time outdoors.  He has remained quite active physically until recently.  He complains that over the last 3 months he has developed significant exertional shortness of breath that has now begun to limit his lifestyle considerably.  He denies any history of resting shortness of breath, PND, orthopnea, or lower extremity edema.  He gets some chest tightness during periods of exercise and shortness of breath.  Symptoms of chest tightness and shortness of breath are promptly relieved by rest.  He has never used sublingual nitroglycerin and he has not had any prolonged episodes of chest discomfort.  Past Medical History:  Diagnosis Date  . Abnormal cardiovascular stress test, 11/2011 wint inferolateral reveribility 12/22/2011  . Angina effort, 12/2011 with DOE anginal equivlent 12/22/2011  . Blood transfusion    "  no reaction to transfusion "  . CAD (coronary artery disease), CABG 1996. 09/23/2011   2D Echo - EF >55%, moderate calcification of the aortic valve leaflets  . Carotid artery disease, Rt. CEA, 08/04/07, Lt. CEA 04/26/07. 12/23/2011  . Chronic kidney disease   . CKD (chronic kidney disease) stage 2, GFR 60-89 ml/min 09/16/2011  . Coronary artery disease   . Crescendo angina ([place]) 06/09/2012  . CVA (cerebral vascular accident) ([place]) 04/22/2007   2D Echo - EF 50-55%, aortic valve thickness mild-moderately increased,  . Dyspnea on exertion, anginal equivilant 10/16/2010   2D Echo - EF >55%, mild-moderate mitral regurgiation, mild-moderate tricuspid regurgitation, moderate calcification of the aortic leaflets    . Dysrhythmia    LLB  anf leaking valve per patient  . HTN (hypertension),uncontrolled 09/16/2011  . Hypertension   . Peripheral arterial disease ([place])   . Pulmonary hypertension, mild 09/17/2011  . Recurrent angina status post rotational atherectomy, with PTCA for ISR to RCA, 12/22/2011 12/22/2011  . Shortness of breath   . Stroke ([place])    hx of TIA    Past Surgical History:  Procedure Laterality Date  .  Fluid knee Right Sept. 6, 2014   Fluid removed  . CARDIAC CATHETERIZATION Left 06/10/2012   Ostial RCA 99% stenosis pre-dilation 3x27mm Angiosculpt PTCA balloon, 3.5x49mm Promus Element DES overlapping proximal stent and into the Aorto-ostium, post-dilated with4x33mm [place] Trek balloon - 99% stenosis reduced to 0%  . CARDIAC CATHETERIZATION Left 12/22/2011   Left Main-diffuse tapering 50-60% stenosis; mild-moderate diseaseof the right ventricle branch of the right coronary artery; will plan FFR of ostial RCA  . CARDIAC CATHETERIZATION Bilateral 09/16/2011   Severe 90% ostial RCA disease which is calcified, will likely need PCI to ostial RCA with calcification  . CARDIAC CATHETERIZATION  09/18/2011   Calcified ostial 90-95% proximal RCA stenosis, 3x37mm Emerge balloon predilation, 3x21mm DES PROMUS Element stent was placed, post stent dilation done utilizing a 3.25x11mm noncompliant Quantum Apex balloon, 90-95% stenosis reduced to 0%  . CEA  2008  . CORONARY ANGIOPLASTY    . CORONARY ARTERY BYPASS GRAFT  08/04/2007  . FRACTIONAL FLOW RESERVE WIRE  12/22/2011   Ostial RCA, FFR ratio-0.82, physiologically significant, 3.25x81mm Flextome Cutting balloon deployed  at ~3.61mm final estimated diameter  . FRACTIONAL FLOW RESERVE WIRE Right 12/22/2011   Procedure: FRACTIONAL FLOW RESERVE WIRE;  Surgeon: Pixie Casino, MD;  Location: [facility] CATH LAB;  Service: Cardiovascular;  Laterality: Right;  . LEFT AND RIGHT HEART CATHETERIZATION WITH CORONARY ANGIOGRAM Left 09/16/2011   Procedure: LEFT AND RIGHT HEART  CATHETERIZATION WITH CORONARY ANGIOGRAM;  Surgeon: Pixie Casino, MD;  Location: [facility] CATH LAB;  Service: Cardiovascular;  Laterality: Left;  . LEFT HEART CATHETERIZATION WITH CORONARY ANGIOGRAM N/A 06/10/2012   Procedure: LEFT HEART CATHETERIZATION WITH CORONARY ANGIOGRAM;  Surgeon: Pixie Casino, MD;  Location: [facility] CATH LAB;  Service: Cardiovascular;  Laterality: N/A;  . LEFT HEART CATHETERIZATION WITH CORONARY/GRAFT ANGIOGRAM N/A 12/22/2011   Procedure: LEFT HEART CATHETERIZATION WITH Beatrix Fetters;  Surgeon: Pixie Casino, MD;  Location: [facility] CATH LAB;  Service: Cardiovascular;  Laterality: N/A;  . PERCUTANEOUS CORONARY ROTOBLATOR INTERVENTION (PCI-R)  09/18/2011   Procedure: PERCUTANEOUS CORONARY ROTOBLATOR INTERVENTION (PCI-R);  Surgeon: Troy Sine, MD;  Location: [facility] CATH LAB;  Service: Cardiovascular;;  . PERCUTANEOUS CORONARY STENT INTERVENTION (PCI-S) N/A 09/18/2011   Procedure: PERCUTANEOUS CORONARY STENT INTERVENTION (PCI-S);  Surgeon: Troy Sine, MD;  Location: [facility] CATH LAB;  Service: Cardiovascular;  Laterality: N/A;  . RIGHT/LEFT HEART CATH AND CORONARY/GRAFT ANGIOGRAPHY N/A 07/30/2017   Procedure: RIGHT/LEFT HEART CATH AND CORONARY/GRAFT ANGIOGRAPHY;  Surgeon: Belva Crome, MD;  Location: [facility] CV LAB;  Service: Cardiovascular;  Laterality: N/A;  . TEMPORARY PACEMAKER INSERTION  09/18/2011   Procedure: TEMPORARY PACEMAKER INSERTION;  Surgeon: Troy Sine, MD;  Location: [facility] CATH LAB;  Service: Cardiovascular;;    Family History  Problem Relation Age of Onset  . Cancer Mother   . Heart disease Mother   . Stroke Mother   . Alcohol abuse Father   . Depression Father   . Alzheimer's disease Father   . Heart disease Brother        Heart bypass in 1996  . Cancer Brother        Prostate  . Cancer Sister     Social History   Socioeconomic History  . Marital status: Married    Spouse name: Not on file  . Number of children: Not on file  . Years of  education: Not on file  . Highest education level: Not on file  Social Needs  . Financial resource strain: Not on file  . Food insecurity - worry: Not on file  . Food insecurity - inability: Not on file  . Transportation needs - medical: Not on file  . Transportation needs - non-medical: Not on file  Occupational History  . Not on file  Tobacco Use  . Smoking status: Never Smoker  . Smokeless tobacco: Never Used  . Tobacco comment: rarely smoked when he did smoke   Substance and Sexual Activity  . Alcohol use: No    Alcohol/week: 0.0 oz  . Drug use: No  . Sexual activity: Not Currently  Other Topics Concern  . Not on file  Social History Narrative  . Not on file    Current Outpatient Medications  Medication Sig Dispense Refill  . amLODipine (NORVASC) 10 MG tablet Take 1 tablet (10 mg total) by mouth at bedtime. 30 tablet 6  . aspirin EC 81 MG tablet Take 81 mg by mouth at bedtime.     . clopidogrel (PLAVIX) 75 MG tablet Take 1 tablet (75 mg total) by mouth daily. (Patient not taking: Reported on 08/28/2017) 90  tablet 3  . fish oil-omega-3 fatty acids 1000 MG capsule Take 1 g by mouth 2 (two) times daily.     . isosorbide mononitrate (IMDUR) 30 MG 24 hr tablet Take 30 mg by mouth 3 (three) times daily.    . metoprolol tartrate (LOPRESSOR) 25 MG tablet Take 25 mg by mouth 2 (two) times daily.    . niacin (NIASPAN) 500 MG CR tablet Take 500 mg by mouth at bedtime.    . nitroGLYCERIN (NITROSTAT) 0.4 MG SL tablet Place 1 tablet (0.4 mg total) under the tongue every 5 (five) minutes x 3 doses as needed. 25 tablet 3  . rosuvastatin (CRESTOR) 40 MG tablet Take 13.3333 mg by mouth at bedtime. Take 1/3 tablet by mouth daily.      No current facility-administered medications for this visit.    Facility-Administered Medications Ordered in Other Visits  Medication Dose Route Frequency Provider Last Rate Last Dose  . sodium chloride 0.9 % injection 3 mL  3 mL Intravenous PRN [name], Nadean Corwin, MD        Allergies  Allergen Reactions  . Atorvastatin Other (See Comments)    Muscle and joint pain, can tolerate rosuvastatin      Review of Systems:   General:  normal appetite, normal energy, no weight gain, no weight loss, no fever  Cardiac:  + chest pain with exertion, no chest pain at rest, + SOB with exertion, no resting SOB, no PND, no orthopnea, no palpitations, no arrhythmia, no atrial fibrillation, no LE edema, no dizzy spells, no syncope  Respiratory:  + exertional shortness of breath, no home oxygen, no productive cough, occasional dry cough, no bronchitis, no wheezing, no hemoptysis, no asthma, no pain with inspiration or cough, no sleep apnea, no CPAP at night  GI:   no difficulty swallowing, no reflux, no frequent heartburn, no hiatal hernia, no abdominal pain, no constipation, no diarrhea, no hematochezia, no hematemesis, no melena  GU:   no dysuria,  + frequency, no urinary tract infection, no hematuria, no enlarged prostate, no kidney stones, no kidney disease  Vascular:  no pain suggestive of claudication, no pain in feet, + occasional leg cramps, no varicose veins, no DVT, no non-healing foot ulcer  Neuro:   + stroke, no TIA's, no seizures, no headaches, no temporary blindness one eye,  no slurred speech, no peripheral neuropathy, no chronic pain, no instability of gait, no memory/cognitive dysfunction  Musculoskeletal: no arthritis, no joint swelling, no myalgias, no difficulty walking, normal mobility   Skin:   no rash, no itching, no skin infections, no pressure sores or ulcerations  Psych:   no anxiety, no depression, no nervousness, no unusual recent stress  Eyes:   no blurry vision, no floaters, no recent vision changes, does not wears glasses or contacts  ENT:   no hearing loss, no loose or painful teeth, no dentures, last saw dentist 3-4 months ago  Hematologic:  + easy bruising, no abnormal bleeding, no clotting disorder, no frequent  epistaxis  Endocrine:  no diabetes, does not check CBG's at home           Physical Exam:   BP 128/72   Pulse (!) 50   Resp 20   Ht 5\' 9"  (1.753 m)   Wt 184 lb (83.5 kg)   SpO2 98%   BMI 27.17 kg/m   General:    well-appearing  HEENT:  Unremarkable   Neck:   no JVD, no bruits, no adenopathy  Chest:   clear to auscultation, symmetrical breath sounds, no wheezes, no rhonchi   CV:   RRR, grade III/VI crescendo/decrescendo murmur heard best at RSB,  no diastolic murmur  Abdomen:  soft, non-tender, no masses   Extremities:  warm, well-perfused, pulses palpable, no LE edema  Rectal/GU  Deferred  Neuro:   Grossly non-focal and symmetrical throughout  Skin:   Clean and dry, no rashes, no breakdown   Diagnostic Tests:  Transthoracic Echocardiography  Patient:    [name], [name] MR #:       [id number] Study Date: 05/07/2017 Gender:     M Age:        47 Height:     176.5 cm Weight:     80.6 kg BSA:        2 m^2 Pt. Status: Room:   ATTENDING    Lyman Bishop MD  [place] MD  [place] MD  SONOGRAPHER  Marygrace Drought, RCS  PERFORMING   [facility], Outpatient  cc:  ------------------------------------------------------------------- LV EF: 55% -   60%  ------------------------------------------------------------------- Indications:      Aortic Valve Disease (I35.0).  ------------------------------------------------------------------- History:   PMH:  CKD, CVA.  Chest pain.  Dyspnea.  Risk factors: Hypertension.  ------------------------------------------------------------------- Study Conclusions  - Left ventricle: The cavity size was normal. Wall thickness was   increased in a pattern of moderate LVH. Systolic function was   normal. The estimated ejection fraction was in the range of 55%   to 60%. Wall motion was normal; there were no regional wall   motion abnormalities. Doppler parameters are consistent with   abnormal left  ventricular relaxation (grade 1 diastolic   dysfunction). - Aortic valve: Trileaflet; severely calcified leaflets. There was   moderate stenosis. There was mild regurgitation. Mean gradient   (S): 31 mm Hg. Valve area (VTI): 1.13 cm^2. - Mitral valve: Mildly calcified annulus. There was mild   regurgitation. - Left atrium: The atrium was mildly dilated. - Right ventricle: The cavity size was normal. Systolic function   was normal. - Right atrium: The atrium was mildly dilated. - Tricuspid valve: Peak RV-RA gradient (S): 28 mm Hg. - Pulmonary arteries: PA peak pressure: 31 mm Hg (S). - Inferior vena cava: The vessel was normal in size. The   respirophasic diameter changes were in the normal range (= 50%),   consistent with normal central venous pressure.  Impressions:  - Normal LV size with moderate LV hypertrophy, EF 55-60%. Normal RV   size and systolic function. Moderate aortic stenosis with mild   aortic insufficiency. Mild mitral regurgitation.  ------------------------------------------------------------------- Study data:  Comparison was made to the study of 04/25/2016.  Study status:  Routine.  Procedure:  The patient reported no pain pre or post test. Transthoracic echocardiography. Image quality was adequate.          Transthoracic echocardiography.  M-mode, complete 2D, spectral Doppler, and color Doppler.  Birthdate: Patient birthdate: [birth year].  Age:  Patient is 84 yr old.  Sex: Gender: male.    BMI: 25.9 kg/m^2.  Blood pressure:     124/68 Patient status:  Outpatient.  Study date:  Study date: 05/07/2017. Study time: 09:28 AM.  Location:  [facility]  -------------------------------------------------------------------  ------------------------------------------------------------------- Left ventricle:  The cavity size was normal. Wall thickness was increased in a pattern of moderate LVH. Systolic function was normal. The estimated ejection fraction was  in the range of 55% to  60%. Wall motion was normal; there were no regional wall motion abnormalities. Doppler parameters are consistent with abnormal left ventricular relaxation (grade 1 diastolic dysfunction).  ------------------------------------------------------------------- Aortic valve:   Trileaflet; severely calcified leaflets.  Doppler:  There was moderate stenosis.   There was mild regurgitation. VTI ratio of LVOT to aortic valve: 0.32. Valve area (VTI): 1.13 cm^2. Indexed valve area (VTI): 0.57 cm^2/m^2. Peak velocity ratio of LVOT to aortic valve: 0.29. Valve area (Vmax): 1.04 cm^2. Indexed valve area (Vmax): 0.52 cm^2/m^2. Mean velocity ratio of LVOT to aortic valve: 0.33. Valve area (Vmean): 1.17 cm^2. Indexed valve area (Vmean): 0.59 cm^2/m^2.    Mean gradient (S): 31 mm Hg. Peak gradient (S): 56 mm Hg.  ------------------------------------------------------------------- Aorta:  Aortic root: The aortic root was normal in size. Ascending aorta: The ascending aorta was normal in size.  ------------------------------------------------------------------- Mitral valve:   Mildly calcified annulus.  Doppler:   There was no evidence for stenosis.   There was mild regurgitation.    Valve area by pressure half-time: 2.75 cm^2. Indexed valve area by pressure half-time: 1.38 cm^2/m^2.    Peak gradient (D): 3 mm Hg.   ------------------------------------------------------------------- Left atrium:  The atrium was mildly dilated.  ------------------------------------------------------------------- Right ventricle:  The cavity size was normal. Systolic function was normal.  ------------------------------------------------------------------- Pulmonic valve:    Structurally normal valve.   Cusp separation was normal.  Doppler:  Transvalvular velocity was within the normal range. There was no  regurgitation.  ------------------------------------------------------------------- Tricuspid valve:   Doppler:  There was mild regurgitation.  ------------------------------------------------------------------- Right atrium:  The atrium was mildly dilated.  ------------------------------------------------------------------- Pericardium:  There was no pericardial effusion.  ------------------------------------------------------------------- Systemic veins: Inferior vena cava: The vessel was normal in size. The respirophasic diameter changes were in the normal range (= 50%), consistent with normal central venous pressure. Diameter: 17 mm.  ------------------------------------------------------------------- Measurements   IVC                                      Value          Reference  ID                                       17    mm       ----------    Left ventricle                           Value          Reference  LV ID, ED, PLAX chordal          (L)     42    mm       43 - 52  LV ID, ES, PLAX chordal                  31    mm       23 - 38  LV fx shortening, PLAX chordal   (L)     26    %        >=29  LV PW thickness, ED                      9     mm       ----------  IVS/LV PW ratio, ED              (H)     1.78           <=1.3  Stroke volume, 2D                        93    ml       ----------  Stroke volume/bsa, 2D                    47    ml/m^2   ----------  LV e&', lateral                           6.96  cm/s     ----------  LV E/e&', lateral                         11.78          ----------  LV e&', medial                            4.9   cm/s     ----------  LV E/e&', medial                          16.73          ----------  LV e&', average                           5.93  cm/s     ----------  LV E/e&', average                         13.83          ----------    Ventricular septum                       Value          Reference  IVS thickness, ED                         16    mm       ----------    LVOT                                     Value          Reference  LVOT ID, S                               21.3  mm       ----------  LVOT area                                3.56  cm^2     ----------  LVOT peak velocity, S                    109   cm/s     ----------  LVOT mean velocity, S  80.9  cm/s     ----------  LVOT VTI, S                              29.5  cm       ----------  Stroke volume (SV), LVOT DP              105.1 ml       ----------  Stroke index (SV/bsa), LVOT DP           52.6  ml/m^2   ----------    Aortic valve                             Value          Reference  Aortic valve peak velocity, S            375   cm/s     ----------  Aortic valve mean velocity, S            246   cm/s     ----------  Aortic valve VTI, S                      92.7  cm       ----------  Aortic mean gradient, S                  30    mm Hg    ----------  Aortic peak gradient, S                  56    mm Hg    ----------  VTI ratio, LVOT/AV                       0.32           ----------  Aortic valve area, VTI                   1.13  cm^2     ----------  Aortic valve area/bsa, VTI               0.57  cm^2/m^2 ----------  Velocity ratio, peak, LVOT/AV            0.29           ----------  Aortic valve area, peak velocity         1.04  cm^2     ----------  Aortic valve area/bsa, peak              0.52  cm^2/m^2 ----------  velocity  Velocity ratio, mean, LVOT/AV            0.33           ----------  Aortic valve area, mean velocity         1.17  cm^2     ----------  Aortic valve area/bsa, mean              0.59  cm^2/m^2 ----------  velocity    Aorta                                    Value          Reference  Aortic root ID, ED  30    mm       ----------    Left atrium                              Value          Reference  LA ID, A-P, ES                           49    mm       ----------  LA ID/bsa, A-P                    (H)     2.45  cm/m^2   <=2.2  LA volume, S                             68.8  ml       ----------  LA volume/bsa, S                         34.4  ml/m^2   ----------  LA volume, ES, 1-p A4C                   67.1  ml       ----------  LA volume/bsa, ES, 1-p A4C               33.6  ml/m^2   ----------  LA volume, ES, 1-p A2C                   66.6  ml       ----------  LA volume/bsa, ES, 1-p A2C               33.3  ml/m^2   ----------    Mitral valve                             Value          Reference  Mitral E-wave peak velocity              82    cm/s     ----------  Mitral A-wave peak velocity              95.6  cm/s     ----------  Mitral deceleration time         (H)     275   ms       150 - 230  Mitral pressure half-time                80    ms       ----------  Mitral peak gradient, D                  3     mm Hg    ----------  Mitral E/A ratio, peak                   0.9            ----------  Mitral valve area, PHT, DP               2.75  cm^2     ----------  Mitral valve area/bsa, PHT, DP  1.38  cm^2/m^2 ----------    Pulmonary arteries                       Value          Reference  PA pressure, S, DP               (H)     31    mm Hg    <=30    Tricuspid valve                          Value          Reference  Tricuspid regurg peak velocity           263   cm/s     ----------  Tricuspid peak RV-RA gradient            28    mm Hg    ----------  Tricuspid maximal regurg                 263   cm/s     ----------  velocity, PISA    Right atrium                             Value          Reference  RA ID, S-I, ES, A4C              (H)     61.2  mm       34 - 49  RA area, ES, A4C                 (H)     21.2  cm^2     8.3 - 19.5  RA volume, ES, A/L                       61    ml       ----------  RA volume/bsa, ES, A/L                   30.5  ml/m^2   ----------    Systemic veins                           Value          Reference  Estimated CVP                             3     mm Hg    ----------    Right ventricle                          Value          Reference  TAPSE                                    18.4  mm       ----------  RV s&', lateral, S                        12.4  cm/s     ----------  Legend: (L)  and  (H)  mark  values outside specified reference range.  ------------------------------------------------------------------- Prepared and Electronically Authenticated by  Loralie Champagne, M.D. 2018-09-20T14:52:18   RIGHT/LEFT HEART CATH AND CORONARY/GRAFT ANGIOGRAPHY  Conclusion    Severe native vessel coronary artery disease with total occlusion of the LAD and circumflex arteries.  75% stenosis in the distal left main ostial LAD within the previously placed stent.  Angiographic left main diameter is less than 2.5 mm.  Native right coronary is stented from the ostium to the proximal segment.  There appear to be overlapping stents.  There is overhang of the ostial stent into the right sinus of Valsalva preventing selective engagement.  The right coronary contains mid vessel eccentric 50-70% stenosis, distal 70% stenosis and 85% obstruction in the PL of the right coronary.  Widely patent bypass grafts including the saphenous vein graft to the first obtuse marginal, saphenous vein graft to the distal obtuse marginal, and the left internal mammary to the LAD.  Moderately severe aortic stenosis with a mean gradient of 30 mmHg, and a calculated aortic valve area of 0.95 cm square.  Normal right heart pressures.  Upper normal LV EDP.  RECOMMENDATIONS:   Continue medical therapy including longitudinal assessment of aortic stenosis.  Current angina is likely from the right coronary but unable to perform intervention due to previously stented ostium containing a stent that overhangs into the sinus of Valsalva.  This prevents selective engagement.   Indications   Coronary artery disease of bypass graft of native heart with  stable angina pectoris (HCC) [I25.708 (ICD-10-CM)]  Nonrheumatic aortic valve stenosis [I35.0 (ICD-10-CM)]  Procedural Details/Technique   Technical Details The right femoral was sterilely prepped and draped. 1% Xylocaine local infiltration was then given for local analgesia. Sedation was administered in the form of intravenous Versed and fentanyl.. Using real-time vascular ultrasound, a Seldinger needle was then used to perform an anterior wall common femoral artery stick. A VUS image was saved for the permanent record. A 5 French sheath was inserted using the modified Seldinger technique. Coronary angiography was then performed using a 5 French B2 multipurpose, internal mammary catheter, and JL4. Left ventriculography was not performed. Hemodynamics were recorded after the internal mammary catheter was used to cross the stenotic aortic valve. A 0.035 straight wire was used within the IMA catheter for ventricular access. The left internal mammary graft was selectively engaged and visualized using the IMA catheter. The native right coronary could not be selectively engaged due to stent overhang into the right coronary sinus of Valsalva. An aortic O2 saturation was obtained with the B2 multipurpose catheter.  Right heart catheterization was performed from the right femoral. 1% Xylocaine was used to locally anesthetize the region. Real-time vascular ultrasound was used to localize the right femoral vein. A 7 French sheath was inserted using a modified Seldinger technique. Right heart pressures were recorded using a Swan-Ganz catheter. A pulmonary artery O2 saturation was obtained from this catheter.   During this procedure the patient is administered a total of Versed 1 mg and Fentanyl 50 mg to achieve and maintain moderate conscious sedation. The patient's heart rate, blood pressure, and oxygen saturation are monitored continuously during the procedure. The period of conscious sedation is 56 minutes, of  which I was present face-to-face 100% of this time.   Estimated blood loss <50 mL.  During this procedure the patient was administered the following to achieve and maintain moderate conscious sedation: Versed 1 mg, Fentanyl 50 mcg, while the patient's heart rate, blood pressure, and oxygen saturation were  continuously monitored. The period of conscious sedation was 56 minutes, of which I was present face-to-face 100% of this time.  Coronary Findings   Diagnostic  Dominance: Right  Left Main  Dist LM to Ost LAD lesion 85% stenosed  Dist LM to Ost LAD lesion is 85% stenosed.  Left Anterior Descending  Ost LAD to Prox LAD lesion 50% stenosed  Ost LAD to Prox LAD lesion is 50% stenosed. The lesion was previously treated.  Prox LAD lesion 100% stenosed  Prox LAD lesion is 100% stenosed.  Left Circumflex  Ost Cx to Prox Cx lesion 100% stenosed  Ost Cx to Prox Cx lesion is 100% stenosed. The lesion was previously treated.  Prox Cx to Mid Cx lesion 100% stenosed  Prox Cx to Mid Cx lesion is 100% stenosed.  First Obtuse Marginal Branch  Ost 1st Mrg lesion 100% stenosed  Ost 1st Mrg lesion is 100% stenosed. The lesion was previously treated.  Right Coronary Artery  Ost RCA to Mid RCA lesion 40% stenosed  Ost RCA to Mid RCA lesion is 40% stenosed. The lesion was previously treated.  Mid RCA lesion 60% stenosed  Mid RCA lesion is 60% stenosed.  Dist RCA lesion 70% stenosed  Dist RCA lesion is 70% stenosed.  Right Posterior Atrioventricular Branch  Post Atrio lesion 85% stenosed  Post Atrio lesion is 85% stenosed.  LIMA Graft to Mid LAD  Graft to 1st Mrg  Graft to 3rd Mrg  Intervention   No interventions have been documented.  Right Heart   Right Heart Pressures Hemodynamic findings consistent with pulmonary hypertension. LV EDP is normal.  Left Heart   Aortic Valve There is moderate aortic valve stenosis. The aortic valve is calcified. There is restricted aortic valve motion.   Coronary Diagrams   Diagnostic Diagram       Implants     No implant documentation for this case.  MERGE Images   Show images for CARDIAC CATHETERIZATION   Link to Procedure Log   Procedure Log    Hemo Data    Most Recent Value  Fick Cardiac Output 4.8 L/min  Fick Cardiac Output Index 2.42 (L/min)/BSA  Aortic Mean Gradient 30.3 mmHg  Aortic Peak Gradient 30 mmHg  Aortic Valve Area 0.95  Aortic Value Area Index 0.48 cm2/BSA  RA A Wave 6 mmHg  RA V Wave 6 mmHg  RA Mean 3 mmHg  RV Systolic Pressure 32 mmHg  RV Diastolic Pressure -5 mmHg  RV EDP 7 mmHg  PA Systolic Pressure 27 mmHg  PA Diastolic Pressure 8 mmHg  PA Mean 16 mmHg  PW A Wave 15 mmHg  PW V Wave 18 mmHg  PW Mean 11 mmHg  AO Systolic Pressure 254 mmHg  AO Diastolic Pressure 61 mmHg  AO Mean 89 mmHg  LV Systolic Pressure 270 mmHg  LV Diastolic Pressure 3 mmHg  LV EDP 18 mmHg  Arterial Occlusion Pressure Extended Systolic Pressure 623 mmHg  Arterial Occlusion Pressure Extended Diastolic Pressure 61 mmHg  Arterial Occlusion Pressure Extended Mean Pressure 90 mmHg  Left Ventricular Apex Extended Systolic Pressure 762 mmHg  Left Ventricular Apex Extended Diastolic Pressure 3 mmHg  Left Ventricular Apex Extended EDP Pressure 17 mmHg  QP/QS 1  TPVR Index 6.6 HRUI  TSVR Index 36.69 HRUI  PVR SVR Ratio 0.06  TPVR/TSVR Ratio 0.18    Cardiac TAVR CT  TECHNIQUE: The patient was scanned on a Siemens 831 slice scanner. A 120 kV retrospective scan was triggered in the  ascending thoracic aorta at 140 HU's. Gantry rotation speed was 250 msecs and collimation was .9 mm. No beta blockade or nitro were given. The 3D data set was reconstructed in 5% intervals of the R-R cycle. Systolic and diastolic phases were analyzed on a dedicated work station using MPR, MIP and VRT modes. The patient received 80 cc of contrast.  FINDINGS: Aortic Valve: Calcified and tri leaflet with restricted motion  Aorta: Normal  diameter and origin of arch vessels mild calcific atherosclerotic debris  Sinotubular Junction: 29 mm  Ascending Thoracic Aorta: 33 mm  Aortic Arch: 32 mm  Descending Thoracic Aorta: 28 mm  Sinus of Valsalva Measurements:  Non-coronary: 32.8 mm  Right -coronary: 35 mm  Left -coronary: 33 mm  Coronary Artery Height above Annulus:  Left Main: 13.5 mm above annulus  Right Coronary: 18.9 mm above annulus  Virtual Basal Annulus Measurements:  Maximum/Minimum Diameter: 21.8 x 26.4 mm  Perimeter: 76.7 mm  Area: 439.5 mm2  Coronary Arteries: Sufficient height above annulus for deployment  Optimum Fluoroscopic Angle for Delivery: LAO 14 degrees Caudal 14 degrees  IMPRESSION: 1) Calcified tri leaflet aortic valve with annular area 439.5 mm2 suitable for 26 mm Sapien 3 valve and perimeter 76.7 mm suitable for a 29 mm Evolut pro valve  2) Coronary arteries sufficient height above annulus for deployment  3. Optimum angiographic angle for deployment LAO 14 degrees Caudal 14 degrees  Peter Nishan   CT ANGIOGRAPHY CHEST, ABDOMEN AND PELVIS  TECHNIQUE: Multidetector CT imaging through the chest, abdomen and pelvis was performed using the standard protocol during bolus administration of intravenous contrast. Multiplanar reconstructed images and MIPs were obtained and reviewed to evaluate the vascular anatomy.  CONTRAST:  95 cc Isovue 370 IV.  COMPARISON:  11/27/2015 renal sonogram.  02/22/2007 chest CT.  FINDINGS: CTA CHEST FINDINGS  Cardiovascular: Mild cardiomegaly. No significant pericardial fluid/thickening. Severe thickening and coarse calcification of the aortic valve. Left main and 3 vessel coronary atherosclerosis status post CABG. Atherosclerotic nonaneurysmal thoracic aorta. No evidence of acute intramural hematoma, dissection, pseudoaneurysm or penetrating atherosclerotic ulcer in the thoracic aorta. Thoracic aortic branch  vessels are patent. Normal caliber pulmonary arteries. No central pulmonary emboli.  Mediastinum/Nodes: No discrete thyroid nodules. Unremarkable esophagus. No axillary adenopathy. Mild right paratracheal adenopathy measuring up to 1.1 cm (series 14/image 40), new. Mildly enlarged 1.0 cm subcarinal node (series 14/image 46), new. No hilar adenopathy.  Lungs/Pleura: No pneumothorax. No pleural effusion. No acute consolidative airspace disease, lung masses or significant pulmonary nodules. Mild hypoventilatory changes in the dependent lower lobes.  Musculoskeletal: No aggressive appearing focal osseous lesions. Intact sternotomy wires. Mild to moderate thoracic spondylosis.  CTA ABDOMEN AND PELVIS FINDINGS  Hepatobiliary: Normal liver size. No liver masses. Normal gallbladder with no radiopaque cholelithiasis. No biliary ductal dilatation.  Pancreas: Normal, with no mass or duct dilation.  Spleen: Normal size spleen. No splenic mass. 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  Adrenals/Urinary Tract: Normal adrenals. No hydronephrosis. A few scattered subcentimeter hypodense renal cortical lesions in both kidneys are too small to characterize and require no follow-up. Normal bladder.  Stomach/Bowel: Normal non-distended stomach. Normal caliber small bowel with no small bowel wall thickening. Normal appendix. Mild sigmoid diverticulosis, with no large bowel wall thickening or pericolonic fat stranding.  Vascular/Lymphatic: Atherosclerotic nonaneurysmal abdominal aorta. Patent splenic and renal veins. No pathologically enlarged lymph nodes in the abdomen  or pelvis.  Reproductive: Mildly enlarged prostate with nonspecific internal prostatic calcifications.  Other: No pneumoperitoneum, ascites or focal fluid  collection.  Musculoskeletal: No aggressive appearing focal osseous lesions. Moderate lumbar spondylosis.  VASCULAR MEASUREMENTS PERTINENT TO TAVR:  AORTA:  Minimal Aortic Diameter-13.7 x 11.4 mm (infrarenal abdominal aorta)  Severity of Aortic Calcification-moderate to severe  RIGHT PELVIS:  Right Common Iliac Artery -  Minimal Diameter-9.1 x 7.5 mm  Tortuosity-mild  Calcification-moderate  Right External Iliac Artery -  Minimal Diameter-8.0 x 6.7 mm  Tortuosity-mild  Calcification-mild  Right Common Femoral Artery -  Minimal Diameter-8.1 x 7.4 mm  Tortuosity-mild  Calcification-mild-to-moderate  LEFT PELVIS:  Left Common Iliac Artery -  Minimal Diameter-8.6 x 6.9 mm  Tortuosity-mild  Calcification-moderate to severe  Left External Iliac Artery -  Minimal Diameter-8.7 x 8.4 mm  Tortuosity-mild  Calcification-none  Left Common Femoral Artery -  Minimal Diameter-9.2 x 8.0 mm  Tortuosity-mild  Calcification-mild  Review of the MIP images confirms the above findings.  IMPRESSION: 1. Vascular findings and measurements pertinent to potential TAVR procedure, as detailed above. 2. Severe thickening and calcification of the aortic valve, compatible with the reported clinical history of severe aortic stenosis. 3. Mild cardiomegaly. Left main and 3 vessel coronary atherosclerosis status post CABG. 4. Nonspecific mild mediastinal lymphadenopathy, new since 2008 chest CT. No follow-up is required. This recommendation follows ACR consensus guidelines: Managing Incidental Findings on Thoracic CT: Mediastinal and Cardiovascular Findings. A White Paper of the ACR Incidental Findings Committee. J Am Coll Radiol. 2018; 15: 9323-5573. 5. 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 6. Chronic findings include: Aortic Atherosclerosis (ICD10-I70.0). Mild sigmoid diverticulosis. Mild  prostatomegaly.   Electronically Signed   By: Ilona Sorrel M.D.   On: 08/28/2017 14:16   STS Risk Calculator Procedure: AVR + CAB CALCULATE  Risk of Mortality:  5.270%   Renal Failure:  6.636%   Permanent Stroke:  3.498%   Prolonged Ventilation:  18.084%   DSW Infection:  0.162%   Reoperation:  6.240%   Morbidity or Mortality:  27.508%   Short Length of Stay:  15.795%   Long Length of Stay:  13.772%      Impression:  Patient has stage D severe symptomatic aortic stenosis and multivessel coronary artery disease status post coronary artery bypass grafting in 1996 and status post PCI and stenting of the right coronary artery in 2013.  He presents with progressive symptoms of exertional shortness of breath and chest tightness consistent with chronic diastolic congestive heart failure and stable angina pectoris, [place] Heart Association functional class II.  I have personally reviewed the patient's most recent transthoracic echocardiogram, diagnostic cardiac catheterization, and CT angiograms, and his echocardiogram and catheterization were both reviewed by a multidisciplinary team of specialists earlier this week.  Echocardiogram demonstrates the presence of severe aortic stenosis.  Peak velocity across the aortic valve measured close to 4 m/s with mean transvalvular gradient estimated greater than 40 mmHg despite the presence of mild left ventricular systolic dysfunction with ejection fraction 55%.  All 3 leaflets of the aortic valve are heavily calcified, thickened, and demonstrate severely restricted leaflet mobility.  Mean transvalvular gradient measured at the time of catheterization was greater than 30 mmHg, corresponding to aortic valve area calculated 0.95 cm.  Catheterization revealed chronic occlusion of both the left anterior descending coronary artery and left circumflex coronary arteries but continued patency without significant disease involving the previous bypass grafts  placed to the left anterior descending coronary artery and  both the first and second obtuse marginal branches of the left circumflex coronary artery.  The right coronary artery is diffusely diseased with moderate in-stent restenosis and 85% proximal stenosis involving the relatively small posterolateral branch off of the distal right coronary artery.  I agree the patient would benefit from aortic valve replacement.  Redo coronary artery bypass grafting could be considered to revascularize the right coronary artery, but the terminal branches of the right coronary artery are relatively small and somewhat diffusely diseased.  Under the circumstances risks associated with conventional surgery would be at least moderately elevated.  Cardiac-gated CTA of the heart reveals anatomical characteristics consistent with aortic stenosis suitable for treatment by transcatheter aortic valve replacement without any significant complicating features and CTA of the aorta and iliac vessels demonstrate what appears to be adequate pelvic vascular access to facilitate a transfemoral approach.    Plan:  The patient and his wife were counseled at length regarding treatment alternatives for management of severe symptomatic aortic stenosis and multivessel CAD. Alternative approaches such as conventional aortic valve replacement with redo CABG, transcatheter aortic valve replacement, and continued medical therapy were compared and contrasted at length.  The risks associated with conventional surgical aortic valve replacement were been discussed in detail, as were expectations for post-operative convalescence, and why I would be reluctant to consider this patient a candidate for conventional surgery.  Issues specific to transcatheter aortic valve replacement were discussed including questions about long term valve durability, the potential for paravalvular leak, possible increased risk of need for permanent pacemaker placement, and other  technical complications related to the procedure itself.  Long-term prognosis with medical therapy was discussed. This discussion was placed in the context of the patient's own specific clinical presentation and past medical history.  All of their questions been addressed.  The patient hopes to proceed with transcatheter aortic valve replacement as soon as practical.  Following the decision to proceed with transcatheter aortic valve replacement, a discussion has been held regarding what types of management strategies would be attempted intraoperatively in the event of life-threatening complications, including whether or not the patient would be considered a candidate for the use of cardiopulmonary bypass and/or conversion to open sternotomy for attempted surgical intervention.  The patient has been advised of a variety of complications that might develop including but not limited to risks of death, stroke, paravalvular leak, aortic dissection or other major vascular complications, aortic annulus rupture, device embolization, cardiac rupture or perforation, mitral regurgitation, acute myocardial infarction, arrhythmia, heart block or bradycardia requiring permanent pacemaker placement, congestive heart failure, respiratory failure, renal failure, pneumonia, infection, other late complications related to structural valve deterioration or migration, or other complications that might ultimately cause a temporary or permanent loss of functional independence or other long term morbidity.  The patient provides full informed consent for the procedure as described and all questions were answered.    I spent in excess of 90 minutes during the conduct of this office consultation and >50% of this time involved direct face-to-face encounter with the patient for counseling and/or coordination of their care.    Valentina Gu. Roxy Manns, MD 08/28/2017 2:23 PM

## 2017-08-28 NOTE — Patient Instructions (Signed)
  Continue taking all current medications without change through the day before surgery.  Have nothing to eat or drink after midnight the night before surgery.  On the morning of surgery do not take any of your regular medications

## 2017-08-30 ENCOUNTER — Other Ambulatory Visit: Payer: Self-pay

## 2017-08-30 DIAGNOSIS — I350 Nonrheumatic aortic (valve) stenosis: Secondary | ICD-10-CM

## 2017-09-01 ENCOUNTER — Ambulatory Visit: Payer: Medicare Other | Attending: Surgery | Admitting: Physical Therapy

## 2017-09-01 ENCOUNTER — Ambulatory Visit (HOSPITAL_COMMUNITY)
Admission: RE | Admit: 2017-09-01 | Discharge: 2017-09-01 | Disposition: A | Discharge status: Home / Self Care | Payer: Medicare Other | Source: Ambulatory Visit | Attending: Surgery | Admitting: Surgery

## 2017-09-01 ENCOUNTER — Other Ambulatory Visit: Payer: Self-pay

## 2017-09-01 ENCOUNTER — Encounter: Payer: Self-pay | Admitting: Physical Therapy

## 2017-09-01 ENCOUNTER — Other Ambulatory Visit: Payer: Medicare Other | Admitting: *Deleted

## 2017-09-01 DIAGNOSIS — J984 Other disorders of lung: Secondary | ICD-10-CM | POA: Diagnosis not present

## 2017-09-01 DIAGNOSIS — N289 Disorder of kidney and ureter, unspecified: Secondary | ICD-10-CM

## 2017-09-01 DIAGNOSIS — I350 Nonrheumatic aortic (valve) stenosis: Secondary | ICD-10-CM

## 2017-09-01 DIAGNOSIS — R2689 Other abnormalities of gait and mobility: Secondary | ICD-10-CM | POA: Diagnosis present

## 2017-09-01 LAB — PULMONARY FUNCTION TEST
DL/VA % PRED: 63 %
DL/VA: 2.85 ml/min/mmHg/L
DLCO UNC % PRED: 51 %
DLCO unc: 16.01 ml/min/mmHg
FEF 25-75 Post: 2.67 L/sec
FEF 25-75 Pre: 2.55 L/sec
FEF2575-%Change-Post: 4 %
FEF2575-%PRED-POST: 147 %
FEF2575-%Pred-Pre: 141 %
FEV1-%CHANGE-POST: 0 %
FEV1-%Pred-Post: 124 %
FEV1-%Pred-Pre: 123 %
FEV1-POST: 3.34 L
FEV1-Pre: 3.32 L
FEV1FVC-%CHANGE-POST: 7 %
FEV1FVC-%Pred-Pre: 106 %
FEV6-%Change-Post: -4 %
FEV6-%Pred-Post: 116 %
FEV6-%Pred-Pre: 121 %
FEV6-PRE: 4.29 L
FEV6-Post: 4.12 L
FEV6FVC-%Change-Post: 2 %
FEV6FVC-%PRED-PRE: 104 %
FEV6FVC-%Pred-Post: 107 %
FVC-%Change-Post: -6 %
FVC-%PRED-POST: 108 %
FVC-%Pred-Pre: 116 %
FVC-PRE: 4.41 L
FVC-Post: 4.13 L
POST FEV1/FVC RATIO: 81 %
PRE FEV6/FVC RATIO: 97 %
Post FEV6/FVC ratio: 100 %
Pre FEV1/FVC ratio: 75 %
RV % PRED: 48 %
RV: 1.27 L
TLC % pred: 84 %
TLC: 5.78 L

## 2017-09-01 MED ORDER — ALBUTEROL SULFATE (2.5 MG/3ML) 0.083% IN NEBU
2.5000 mg | INHALATION_SOLUTION | Freq: Once | RESPIRATORY_TRACT | Status: AC
  Administered 2017-09-01: 2.5 mg via RESPIRATORY_TRACT

## 2017-09-01 NOTE — Therapy (Signed)
[place], [place], [address] Phone: [phone]   Fax:  [phone]  Physical Therapy Evaluation  Patient Details  Name: [name] MRN: [id number] Date of Birth: [birth year] Referring Provider: Dr. Gilford Raid   Encounter Date: 09/01/2017  PT End of Session - 09/01/17 1425    Visit Number  1    PT Start Time  1027    PT Stop Time  2536    PT Time Calculation (min)  35 min       Past Medical History:  Diagnosis Date  . Abnormal cardiovascular stress test, 11/2011 wint inferolateral reveribility 12/22/2011  . Angina effort, 12/2011 with DOE anginal equivlent 12/22/2011  . Blood transfusion    "  no reaction to transfusion "  . CAD (coronary artery disease), CABG 1996. 09/23/2011   2D Echo - EF >55%, moderate calcification of the aortic valve leaflets  . Carotid artery disease, Rt. CEA, 08/04/07, Lt. CEA 04/26/07. 12/23/2011  . Chronic kidney disease   . CKD (chronic kidney disease) stage 2, GFR 60-89 ml/min 09/16/2011  . Coronary artery disease   . Crescendo angina ([place]) 06/09/2012  . CVA (cerebral vascular accident) ([place]) 04/22/2007   2D Echo - EF 50-55%, aortic valve thickness mild-moderately increased,  . Dyspnea on exertion, anginal equivilant 10/16/2010   2D Echo - EF >55%, mild-moderate mitral regurgiation, mild-moderate tricuspid regurgitation, moderate calcification of the aortic leaflets  . Dysrhythmia    LLB  anf leaking valve per patient  . HTN (hypertension),uncontrolled 09/16/2011  . Hypertension   . Peripheral arterial disease ([place])   . Pulmonary hypertension, mild 09/17/2011  . Recurrent angina status post rotational atherectomy, with PTCA for ISR to RCA, 12/22/2011 12/22/2011  . Shortness of breath   . Stroke ([place])    hx of TIA    Past Surgical History:  Procedure Laterality Date  .  Fluid knee Right Sept. 6, 2014   Fluid removed  . CARDIAC CATHETERIZATION Left 06/10/2012   Ostial RCA 99%  stenosis pre-dilation 3x30mm Angiosculpt PTCA balloon, 3.5x27mm Promus Element DES overlapping proximal stent and into the Aorto-ostium, post-dilated with4x8mm [place] Trek balloon - 99% stenosis reduced to 0%  . CARDIAC CATHETERIZATION Left 12/22/2011   Left Main-diffuse tapering 50-60% stenosis; mild-moderate diseaseof the right ventricle branch of the right coronary artery; will plan FFR of ostial RCA  . CARDIAC CATHETERIZATION Bilateral 09/16/2011   Severe 90% ostial RCA disease which is calcified, will likely need PCI to ostial RCA with calcification  . CARDIAC CATHETERIZATION  09/18/2011   Calcified ostial 90-95% proximal RCA stenosis, 3x79mm Emerge balloon predilation, 3x34mm DES PROMUS Element stent was placed, post stent dilation done utilizing a 3.25x58mm noncompliant Quantum Apex balloon, 90-95% stenosis reduced to 0%  . CEA  2008  . CORONARY ANGIOPLASTY    . CORONARY ARTERY BYPASS GRAFT  08/04/2007  . FRACTIONAL FLOW RESERVE WIRE  12/22/2011   Ostial RCA, FFR ratio-0.82, physiologically significant, 3.25x32mm Flextome [place] balloon deployed at ~3.41mm final estimated diameter  . FRACTIONAL FLOW RESERVE WIRE Right 12/22/2011   Procedure: FRACTIONAL FLOW RESERVE WIRE;  Surgeon: Pixie Casino, MD;  Location: [facility] CATH LAB;  Service: Cardiovascular;  Laterality: Right;  . LEFT AND RIGHT HEART CATHETERIZATION WITH CORONARY ANGIOGRAM Left 09/16/2011   Procedure: LEFT AND RIGHT HEART CATHETERIZATION WITH CORONARY ANGIOGRAM;  Surgeon: Pixie Casino, MD;  Location: [facility] CATH LAB;  Service: Cardiovascular;  Laterality: Left;  . LEFT HEART CATHETERIZATION WITH CORONARY ANGIOGRAM N/A  06/10/2012   Procedure: LEFT HEART CATHETERIZATION WITH CORONARY ANGIOGRAM;  Surgeon: Pixie Casino, MD;  Location: [facility] CATH LAB;  Service: Cardiovascular;  Laterality: N/A;  . LEFT HEART CATHETERIZATION WITH CORONARY/GRAFT ANGIOGRAM N/A 12/22/2011   Procedure: LEFT HEART CATHETERIZATION WITH Beatrix Fetters;  Surgeon:  Pixie Casino, MD;  Location: [facility] CATH LAB;  Service: Cardiovascular;  Laterality: N/A;  . PERCUTANEOUS CORONARY ROTOBLATOR INTERVENTION (PCI-R)  09/18/2011   Procedure: PERCUTANEOUS CORONARY ROTOBLATOR INTERVENTION (PCI-R);  Surgeon: Troy Sine, MD;  Location: [facility] CATH LAB;  Service: Cardiovascular;;  . PERCUTANEOUS CORONARY STENT INTERVENTION (PCI-S) N/A 09/18/2011   Procedure: PERCUTANEOUS CORONARY STENT INTERVENTION (PCI-S);  Surgeon: Troy Sine, MD;  Location: [facility] CATH LAB;  Service: Cardiovascular;  Laterality: N/A;  . RIGHT/LEFT HEART CATH AND CORONARY/GRAFT ANGIOGRAPHY N/A 07/30/2017   Procedure: RIGHT/LEFT HEART CATH AND CORONARY/GRAFT ANGIOGRAPHY;  Surgeon: Belva Crome, MD;  Location: [place] CV LAB;  Service: Cardiovascular;  Laterality: N/A;  . TEMPORARY PACEMAKER INSERTION  09/18/2011   Procedure: TEMPORARY PACEMAKER INSERTION;  Surgeon: Troy Sine, MD;  Location: [facility] CATH LAB;  Service: Cardiovascular;;    There were no vitals filed for this visit.   Subjective Assessment - 09/01/17 1427    Subjective  Noticed progressive shortness of breath and chest tightness with moderate activity such as walking up inclines and doing activity quickly.     Patient Stated Goals  to fix heart    Currently in Pain?  No/denies         [facility] PT Assessment - 09/01/17 0001      Assessment   Medical Diagnosis  severe aortic stenosis    Referring Provider  Dr. Gilford Raid    Onset Date/Surgical Date  -- approximately 3 months ago      Precautions   Precautions  None      Restrictions   Weight Bearing Restrictions  No      Balance Screen   Has the patient fallen in the past 6 months  No    Has the patient had a decrease in activity level because of a fear of falling?   No    Is the patient reluctant to leave their home because of a fear of falling?   No      Home Environment   Living Environment  Private residence    Home Access  Level entry    [place]  One level       Prior Function   Level of Independence  Independent with community mobility without device      Posture/Postural Control   Posture/Postural Control  Postural limitations    Postural Limitations  Rounded Shoulders;Forward head mild      ROM / Strength   AROM / PROM / Strength  AROM;Strength      AROM   Overall AROM Comments  grossly WNL      Strength   Overall Strength Comments  grossly 5/5 throughout    Strength Assessment Site  Hand    Right/Left hand  Right;Left    Right Hand Grip (lbs)  84 R hand dominant    Left Hand Grip (lbs)  60      Ambulation/Gait   Gait Comments  No significant gait deviations. Limited by 25% for age/gender in 6 minute walk.       [facility] Pre-Surgical Assessment - 09/01/17 0001    5 Meter Walk Test- trial 1  4 sec    5 Meter Walk Test-  trial 2  3 sec.     5 Meter Walk Test- trial 3  4 sec.    5 meter walk test average  3.67 sec    4 Stage Balance Test tolerated for:   7 sec.    4 Stage Balance Test Position  4    Sit To Stand Test- trial 1  13 sec.    ADL/IADL Independent with:  Bathing;Dressing;Meal prep;Finances;Yard work    6 Minute Walk- Baseline  yes    BP (mmHg)  149/68    HR (bpm)  54    02 Sat (%RA)  99 %    Modified Borg Scale for Dyspnea  0- Nothing at all    Perceived Rate of Exertion (Borg)  6-    6 Minute Walk Post Test  yes    BP (mmHg)  162/73    HR (bpm)  87    02 Sat (%RA)  97 %    Modified Borg Scale for Dyspnea  0.5- Very, very slight shortness of breath    Perceived Rate of Exertion (Borg)  7- Very, very light    Aerobic Endurance Distance Walked  1025           Objective measurements completed on examination: See above findings.                           Plan - 09/01/17 1500    Clinical Impression Statement  see below    PT Frequency  One time visit      Clinical Impression Statement: Pt is an active 84 yo male presenting to OP PT for evaluation prior to possible TAVR surgery due to  severe aortic stenosis. Pt reports onset of progressive shortness of breath and chest tightness approximately 3 months ago with moderate activity.  Pt presents with good ROM and strength, good balance and is not at high fall risk 4 stage balance test, good walking speed and fair to good aerobic endurance per 6 minute walk test. Pt ambulated a total of 1025 feet in 6 minute walk. Based on the Short Physical Performance Battery, patient has a frailty rating of 11/12 with </= 5/12 considered frail.   Patient demonstrated the following deficits and impairments:     Visit Diagnosis: Other abnormalities of gait and mobility     Problem List Patient Active Problem List   Diagnosis Date Noted  . Stroke ([place])   . Shortness of breath   . Peripheral arterial disease ([place])   . Hypertension   . Dysrhythmia   . Coronary artery disease   . Chronic kidney disease   . Other fatigue 07/17/2017  . Chest tightness 04/17/2017  . Fusion beats 10/13/2016  . Mixed hyperlipidemia 10/13/2016  . Carotid stenosis 06/19/2014  . Aftercare following surgery of the circulatory system 06/19/2014  . LBBB (left bundle branch block) 09/08/2013  . Aftercare following surgery of the circulatory system, [place] 06/13/2013  . Crescendo angina ([place]) 06/09/2012  . Occlusion and stenosis of carotid artery without mention of cerebral infarction 05/31/2012  . Carotid artery disease, Rt. CEA, 08/04/07, Lt. CEA 04/26/07. 12/23/2011  . Abnormal cardiovascular stress test, 11/2011 wint inferolateral reversibility 12/22/2011  . Recurrent angina status post rotational atherectomy, with PTCA for ISR to RCA, 12/22/2011. 12/22/2011  . Angina effort, 12/2011 with DOE anginal equivlent 12/22/2011  . Pulmonary hypertension, mild 09/17/2011  . Dyspnea on exertion, anginal equivilant 09/16/2011  . CAD (coronary artery disease), CABG 1996.  Promus DES to ostial RCA, 08/2011 09/16/2011  . Essential hypertension 09/16/2011  . CKD (chronic kidney  disease) stage 3, GFR 30-59 ml/min (HCC) 09/16/2011  . Aortic stenosis, moderate 09/16/2011  . HTN (hypertension),uncontrolled 09/16/2011  . CKD (chronic kidney disease) stage 2, GFR 60-89 ml/min 09/16/2011  . CVA (cerebral vascular accident) ([place]) 04/22/2007    Shafter, PT 09/01/2017, 3:00 PM  [place], [place], [address] Phone: [phone]   Fax:  [phone]  Name: [name] MRN: [id number] Date of Birth: August 12, 1934

## 2017-09-02 LAB — BASIC METABOLIC PANEL
BUN / CREAT RATIO: 19 (ref 10–24)
BUN: 29 mg/dL — AB (ref 8–27)
CO2: 22 mmol/L (ref 20–29)
CREATININE: 1.49 mg/dL — AB (ref 0.76–1.27)
Calcium: 9.5 mg/dL (ref 8.6–10.2)
Chloride: 104 mmol/L (ref 96–106)
GFR, EST AFRICAN AMERICAN: 50 mL/min/{1.73_m2} — AB (ref >59)
GFR, EST NON AFRICAN AMERICAN: 43 mL/min/{1.73_m2} — AB (ref >59)
Glucose: 97 mg/dL (ref 65–99)
Potassium: 4.8 mmol/L (ref 3.5–5.2)
Sodium: 140 mmol/L (ref 134–144)

## 2017-09-09 ENCOUNTER — Encounter (INDEPENDENT_AMBULATORY_CARE_PROVIDER_SITE_OTHER): Payer: Medicare Other | Admitting: Ophthalmology

## 2017-09-09 DIAGNOSIS — I10 Essential (primary) hypertension: Secondary | ICD-10-CM

## 2017-09-09 DIAGNOSIS — H34831 Tributary (branch) retinal vein occlusion, right eye, with macular edema: Secondary | ICD-10-CM

## 2017-09-09 DIAGNOSIS — H43813 Vitreous degeneration, bilateral: Secondary | ICD-10-CM

## 2017-09-09 DIAGNOSIS — H35033 Hypertensive retinopathy, bilateral: Secondary | ICD-10-CM | POA: Diagnosis not present

## 2017-09-10 NOTE — Progress Notes (Addendum)
PCP: Deland Pretty, MD  Cardiologist: Lyman Bishop, MD  EKG: 08/30/17 in EPIC  Stress test: most recent 09/20/15 in EPIC  ECHO: most recent 05/07/17 in EPIC  Cardiac Cath: 07/30/17 in EPIC  Chest x-ray: most recent 07/21/17, also will be obtained today per orders  Pt instructed by surgeon to take last of  plavix 09/13/17.  Called [employer] and informed her of abnormal labs values (hipaa verified pt name/dob). 09/11/17 1145.

## 2017-09-10 NOTE — Pre-Procedure Instructions (Addendum)
[name]  09/10/2017      [facility] - [place], [place] - [address], [address], [address] [place] [address] Phone: [phone] Fax: [phone]    Your procedure is scheduled on September 15, 2017.  Report to [facility] Admitting at 530 AM.  Call this number if you have problems the morning of surgery:  [phone]   Remember:  Do not eat food or drink liquids after midnight.  Take these medicines the morning of surgery with A SIP OF WATER (none)  7 days prior to surgery STOP taking any Aleve, Naproxen, Ibuprofen, Motrin, Advil, Goody's, BC's, all herbal medications, fish oil, and all vitamins  Continue/stop Aspirin and Plavix as instructed by your surgeon.  Continue all other medications as instructed by your physician except follow the above medication instructions before surgery   Do not wear jewelry.  Do not wear lotions, powders, or colognes, or deodorant.  Men may shave face and neck.  Do not bring valuables to the hospital.  [facility] is not responsible for any belongings or valuables.  Contacts, dentures or bridgework may not be worn into surgery.  Leave your suitcase in the car.  After surgery it may be brought to your room.  For patients admitted to the hospital, discharge time will be determined by your treatment team.  Patients discharged the day of surgery will not be allowed to drive home.   Special instructions:  [place]- Preparing For Surgery  Before surgery, you can play an important role. Because skin is not sterile, your skin needs to be as free of germs as possible. You can reduce the number of germs on your skin by washing with CHG (chlorahexidine gluconate) Soap before surgery.  CHG is an antiseptic cleaner which kills germs and bonds with the skin to continue killing germs even after washing.  Please do not use if you have an allergy to CHG or antibacterial soaps. If your skin becomes  reddened/irritated stop using the CHG.  Do not shave (including legs and underarms) for at least 48 hours prior to first CHG shower. It is OK to shave your face.  Please follow these instructions carefully.   1. Shower the NIGHT BEFORE SURGERY and the MORNING OF SURGERY with CHG.   2. If you chose to wash your hair, wash your hair first as usual with your normal shampoo.  3. After you shampoo, rinse your hair and body thoroughly to remove the shampoo.  4. Use CHG as you would any other liquid soap. You can apply CHG directly to the skin and wash gently with a scrungie or a clean washcloth.   5. Apply the CHG Soap to your body ONLY FROM THE NECK DOWN.  Do not use on open wounds or open sores. Avoid contact with your eyes, ears, mouth and genitals (private parts). Wash Face and genitals (private parts)  with your normal soap.  6. Wash thoroughly, paying special attention to the area where your surgery will be performed.  7. Thoroughly rinse your body with warm water from the neck down.  8. DO NOT shower/wash with your normal soap after using and rinsing off the CHG Soap.  9. Pat yourself dry with a CLEAN TOWEL.  10. Wear CLEAN PAJAMAS to bed the night before surgery, wear comfortable clothes the morning of surgery  11. Place CLEAN SHEETS on your bed the night of your first shower and DO NOT SLEEP  WITH PETS.  Day of Surgery: Do not apply any deodorants/lotions. Please wear clean clothes to the hospital/surgery center.    Please read over the following fact sheets that you were given. Pain Booklet, Coughing and Deep Breathing, MRSA Information and Surgical Site Infection Prevention

## 2017-09-11 ENCOUNTER — Other Ambulatory Visit: Payer: Self-pay

## 2017-09-11 ENCOUNTER — Encounter (HOSPITAL_COMMUNITY)
Admission: RE | Admit: 2017-09-11 | Discharge: 2017-09-11 | Disposition: A | Discharge status: Home / Self Care | Payer: Medicare Other | Source: Ambulatory Visit | Attending: Surgery | Admitting: Surgery

## 2017-09-11 ENCOUNTER — Encounter (HOSPITAL_COMMUNITY): Payer: Self-pay

## 2017-09-11 ENCOUNTER — Ambulatory Visit (HOSPITAL_COMMUNITY)
Admission: RE | Admit: 2017-09-11 | Discharge: 2017-09-11 | Disposition: A | Discharge status: Home / Self Care | Payer: Medicare Other | Source: Ambulatory Visit | Attending: Cardiovascular Disease | Admitting: Cardiovascular Disease

## 2017-09-11 DIAGNOSIS — Z951 Presence of aortocoronary bypass graft: Secondary | ICD-10-CM | POA: Diagnosis not present

## 2017-09-11 DIAGNOSIS — Z01810 Encounter for preprocedural cardiovascular examination: Secondary | ICD-10-CM | POA: Insufficient documentation

## 2017-09-11 DIAGNOSIS — Z8673 Personal history of transient ischemic attack (TIA), and cerebral infarction without residual deficits: Secondary | ICD-10-CM | POA: Diagnosis not present

## 2017-09-11 DIAGNOSIS — I517 Cardiomegaly: Secondary | ICD-10-CM | POA: Insufficient documentation

## 2017-09-11 DIAGNOSIS — Z01812 Encounter for preprocedural laboratory examination: Secondary | ICD-10-CM | POA: Diagnosis present

## 2017-09-11 DIAGNOSIS — Z01818 Encounter for other preprocedural examination: Secondary | ICD-10-CM | POA: Insufficient documentation

## 2017-09-11 DIAGNOSIS — I350 Nonrheumatic aortic (valve) stenosis: Secondary | ICD-10-CM

## 2017-09-11 DIAGNOSIS — I10 Essential (primary) hypertension: Secondary | ICD-10-CM | POA: Insufficient documentation

## 2017-09-11 DIAGNOSIS — N289 Disorder of kidney and ureter, unspecified: Secondary | ICD-10-CM | POA: Insufficient documentation

## 2017-09-11 DIAGNOSIS — I2510 Atherosclerotic heart disease of native coronary artery without angina pectoris: Secondary | ICD-10-CM | POA: Diagnosis not present

## 2017-09-11 DIAGNOSIS — R001 Bradycardia, unspecified: Secondary | ICD-10-CM | POA: Diagnosis not present

## 2017-09-11 DIAGNOSIS — R0609 Other forms of dyspnea: Secondary | ICD-10-CM | POA: Insufficient documentation

## 2017-09-11 DIAGNOSIS — I447 Left bundle-branch block, unspecified: Secondary | ICD-10-CM | POA: Insufficient documentation

## 2017-09-11 LAB — PROTIME-INR
INR: 1.15
Prothrombin Time: 14.6 seconds (ref 11.4–15.2)

## 2017-09-11 LAB — BLOOD GAS, ARTERIAL
Acid-base deficit: 2.4 mmol/L — ABNORMAL HIGH (ref 0.0–2.0)
Bicarbonate: 21.4 mmol/L (ref 20.0–28.0)
DRAWN BY: 449841
FIO2: 21
O2 Saturation: 98.6 %
PCO2 ART: 33.9 mmHg (ref 32.0–48.0)
Patient temperature: 98.6
pH, Arterial: 7.416 (ref 7.350–7.450)
pO2, Arterial: 118 mmHg — ABNORMAL HIGH (ref 83.0–108.0)

## 2017-09-11 LAB — URINALYSIS, ROUTINE W REFLEX MICROSCOPIC
BILIRUBIN URINE: NEGATIVE
Bacteria, UA: NONE SEEN
Glucose, UA: NEGATIVE mg/dL
Hgb urine dipstick: NEGATIVE
KETONES UR: NEGATIVE mg/dL
LEUKOCYTES UA: NEGATIVE
Nitrite: NEGATIVE
Protein, ur: 100 mg/dL — AB
SQUAMOUS EPITHELIAL / LPF: NONE SEEN
Specific Gravity, Urine: 1.019 (ref 1.005–1.030)
pH: 5 (ref 5.0–8.0)

## 2017-09-11 LAB — CBC
HCT: 36.3 % — ABNORMAL LOW (ref 39.0–52.0)
HEMOGLOBIN: 12 g/dL — AB (ref 13.0–17.0)
MCH: 29.6 pg (ref 26.0–34.0)
MCHC: 33.1 g/dL (ref 30.0–36.0)
MCV: 89.6 fL (ref 78.0–100.0)
Platelets: 145 10*3/uL — ABNORMAL LOW (ref 150–400)
RBC: 4.05 MIL/uL — ABNORMAL LOW (ref 4.22–5.81)
RDW: 15 % (ref 11.5–15.5)
WBC: 7.7 10*3/uL (ref 4.0–10.5)

## 2017-09-11 LAB — COMPREHENSIVE METABOLIC PANEL
ALBUMIN: 3.5 g/dL (ref 3.5–5.0)
ALK PHOS: 100 U/L (ref 38–126)
ALT: 20 U/L (ref 17–63)
AST: 21 U/L (ref 15–41)
Anion gap: 9 (ref 5–15)
BUN: 28 mg/dL — AB (ref 6–20)
CALCIUM: 8.9 mg/dL (ref 8.9–10.3)
CO2: 19 mmol/L — ABNORMAL LOW (ref 22–32)
CREATININE: 1.58 mg/dL — AB (ref 0.61–1.24)
Chloride: 107 mmol/L (ref 101–111)
GFR calc Af Amer: 46 mL/min — ABNORMAL LOW (ref >60)
GFR calc non Af Amer: 39 mL/min — ABNORMAL LOW (ref >60)
GLUCOSE: 87 mg/dL (ref 65–99)
POTASSIUM: 4.7 mmol/L (ref 3.5–5.1)
Sodium: 135 mmol/L (ref 135–145)
TOTAL PROTEIN: 6.5 g/dL (ref 6.5–8.1)
Total Bilirubin: 0.6 mg/dL (ref 0.3–1.2)

## 2017-09-11 LAB — TYPE AND SCREEN
ABO/RH(D): O POS
Antibody Screen: NEGATIVE

## 2017-09-11 LAB — HEMOGLOBIN A1C
Hgb A1c MFr Bld: 5.9 % — ABNORMAL HIGH (ref 4.8–5.6)
Mean Plasma Glucose: 122.63 mg/dL

## 2017-09-11 LAB — APTT: aPTT: 36 seconds (ref 24–36)

## 2017-09-11 LAB — SURGICAL PCR SCREEN
MRSA, PCR: NEGATIVE
Staphylococcus aureus: NEGATIVE

## 2017-09-11 LAB — BRAIN NATRIURETIC PEPTIDE: B Natriuretic Peptide: 295.8 pg/mL — ABNORMAL HIGH (ref 0.0–100.0)

## 2017-09-11 MED ORDER — CHLORHEXIDINE GLUCONATE 4 % EX LIQD: 30.0000 mL | CUTANEOUS | Status: DC

## 2017-09-14 MED ORDER — DOPAMINE-DEXTROSE 3.2-5 MG/ML-% IV SOLN
0.0000 ug/kg/min | INTRAVENOUS | Status: DC
  Filled 2017-09-14: qty 250

## 2017-09-14 MED ORDER — SODIUM CHLORIDE 0.9 % IV SOLN
INTRAVENOUS | Status: DC
  Filled 2017-09-14: qty 30

## 2017-09-14 MED ORDER — POTASSIUM CHLORIDE 2 MEQ/ML IV SOLN
80.0000 meq | INTRAVENOUS | Status: DC
  Filled 2017-09-14: qty 40

## 2017-09-14 MED ORDER — DEXTROSE 5 % IV SOLN
1.5000 g | INTRAVENOUS | Status: AC
  Administered 2017-09-15: 1.5 g via INTRAVENOUS
  Filled 2017-09-14: qty 1.5

## 2017-09-14 MED ORDER — PHENYLEPHRINE HCL 10 MG/ML IJ SOLN
30.0000 ug/min | INTRAMUSCULAR | Status: DC
  Filled 2017-09-14: qty 2

## 2017-09-14 MED ORDER — NITROGLYCERIN IN D5W 200-5 MCG/ML-% IV SOLN
2.0000 ug/min | INTRAVENOUS | Status: DC
  Filled 2017-09-14: qty 250

## 2017-09-14 MED ORDER — MAGNESIUM SULFATE 50 % IJ SOLN
40.0000 meq | INTRAMUSCULAR | Status: DC
  Filled 2017-09-14: qty 9.85

## 2017-09-14 MED ORDER — SODIUM CHLORIDE 0.9 % IV SOLN: INTRAVENOUS | Status: DC

## 2017-09-14 MED ORDER — CHLORHEXIDINE GLUCONATE 0.12 % MT SOLN
15.0000 mL | Freq: Once | OROMUCOSAL | Status: AC
  Administered 2017-09-15: 15 mL via OROMUCOSAL
  Filled 2017-09-14: qty 15

## 2017-09-14 MED ORDER — NOREPINEPHRINE BITARTRATE 1 MG/ML IV SOLN
0.0000 ug/min | INTRAVENOUS | Status: DC
  Filled 2017-09-14: qty 4

## 2017-09-14 MED ORDER — SODIUM CHLORIDE 0.9 % IV SOLN
1500.0000 mg | INTRAVENOUS | Status: AC
  Administered 2017-09-15: 1500 mg via INTRAVENOUS
  Filled 2017-09-14: qty 1500

## 2017-09-14 MED ORDER — DEXMEDETOMIDINE HCL IN NACL 400 MCG/100ML IV SOLN
0.1000 ug/kg/h | INTRAVENOUS | Status: AC
  Administered 2017-09-15: 1.2 ug/kg/h via INTRAVENOUS
  Filled 2017-09-14: qty 100

## 2017-09-14 MED ORDER — EPINEPHRINE PF 1 MG/ML IJ SOLN
0.0000 ug/min | INTRAMUSCULAR | Status: DC
  Filled 2017-09-14: qty 4

## 2017-09-14 MED ORDER — SODIUM CHLORIDE 0.9 % IV SOLN
INTRAVENOUS | Status: DC
  Filled 2017-09-14: qty 1

## 2017-09-14 NOTE — Anesthesia Preprocedure Evaluation (Addendum)
Anesthesia Evaluation  Patient identified by MRN, date of birth, ID band Patient awake    Reviewed: Allergy & Precautions, H&P , NPO status , Patient's Chart, lab work & pertinent test results  Airway Mallampati: II  TM Distance: >3 FB Neck ROM: Full    Dental no notable dental hx. (+) Teeth Intact, Dental Advisory Given   Pulmonary shortness of breath and with exertion,    Pulmonary exam normal breath sounds clear to auscultation       Cardiovascular Exercise Tolerance: Good hypertension, Pt. on medications and Pt. on home beta blockers + CAD, + CABG and + Peripheral Vascular Disease  + Valvular Problems/Murmurs AS  Rhythm:Regular Rate:Normal + Systolic murmurs    Neuro/Psych CVA, No Residual Symptoms negative psych ROS   GI/Hepatic negative GI ROS, Neg liver ROS,   Endo/Other  negative endocrine ROS  Renal/GU Renal InsufficiencyRenal disease  negative genitourinary   Musculoskeletal   Abdominal   Peds  Hematology negative hematology ROS (+)   Anesthesia Other Findings   Reproductive/Obstetrics negative OB ROS                            Anesthesia Physical Anesthesia Plan  ASA: IV  Anesthesia Plan: MAC   Post-op Pain Management:    Induction: Intravenous  PONV Risk Score and Plan: 2 and Ondansetron and Treatment may vary due to age or medical condition  Airway Management Planned: Simple Face Mask  Additional Equipment: Arterial line, CVP and Ultrasound Guidance Line Placement  Intra-op Plan:   Post-operative Plan:   Informed Consent: I have reviewed the patients History and Physical, chart, labs and discussed the procedure including the risks, benefits and alternatives for the proposed anesthesia with the patient or authorized representative who has indicated his/her understanding and acceptance.   Dental advisory given  Plan Discussed with: CRNA  Anesthesia Plan  Comments:        Anesthesia Quick Evaluation

## 2017-09-15 ENCOUNTER — Encounter (HOSPITAL_COMMUNITY): Payer: Self-pay | Admitting: Physician Assistant

## 2017-09-15 ENCOUNTER — Inpatient Hospital Stay (HOSPITAL_COMMUNITY): Payer: Medicare Other | Admitting: Emergency Medicine

## 2017-09-15 ENCOUNTER — Other Ambulatory Visit: Payer: Self-pay

## 2017-09-15 ENCOUNTER — Inpatient Hospital Stay (HOSPITAL_COMMUNITY): Payer: Medicare Other

## 2017-09-15 ENCOUNTER — Encounter: Payer: Self-pay | Admitting: Surgery

## 2017-09-15 ENCOUNTER — Encounter (HOSPITAL_COMMUNITY)
Admission: RE | Disposition: A | Discharge status: Home / Self Care | Payer: Self-pay | Source: Ambulatory Visit | Attending: Cardiovascular Disease

## 2017-09-15 ENCOUNTER — Encounter: Payer: Self-pay | Admitting: Thoracic Surgery (Cardiothoracic Vascular Surgery)

## 2017-09-15 ENCOUNTER — Inpatient Hospital Stay (HOSPITAL_COMMUNITY): Payer: Medicare Other | Admitting: Anesthesiology

## 2017-09-15 ENCOUNTER — Ambulatory Visit (HOSPITAL_COMMUNITY)
Admission: RE | Admit: 2017-09-15 | Discharge: 2017-09-15 | Disposition: A | Discharge status: Home / Self Care | Payer: Medicare Other | Source: Ambulatory Visit | Attending: Cardiovascular Disease | Admitting: Cardiovascular Disease

## 2017-09-15 ENCOUNTER — Inpatient Hospital Stay (HOSPITAL_COMMUNITY)
Admission: RE | Admit: 2017-09-15 | Discharge: 2017-09-17 | DRG: 266 | Disposition: A | Discharge status: Home / Self Care | Payer: Medicare Other | Source: Ambulatory Visit | Attending: Cardiovascular Disease | Admitting: Cardiovascular Disease

## 2017-09-15 DIAGNOSIS — I739 Peripheral vascular disease, unspecified: Secondary | ICD-10-CM | POA: Diagnosis present

## 2017-09-15 DIAGNOSIS — Z7902 Long term (current) use of antithrombotics/antiplatelets: Secondary | ICD-10-CM

## 2017-09-15 DIAGNOSIS — I639 Cerebral infarction, unspecified: Secondary | ICD-10-CM | POA: Diagnosis present

## 2017-09-15 DIAGNOSIS — I5033 Acute on chronic diastolic (congestive) heart failure: Secondary | ICD-10-CM | POA: Diagnosis present

## 2017-09-15 DIAGNOSIS — I272 Pulmonary hypertension, unspecified: Secondary | ICD-10-CM | POA: Diagnosis present

## 2017-09-15 DIAGNOSIS — I6529 Occlusion and stenosis of unspecified carotid artery: Secondary | ICD-10-CM | POA: Diagnosis present

## 2017-09-15 DIAGNOSIS — Z006 Encounter for examination for normal comparison and control in clinical research program: Secondary | ICD-10-CM

## 2017-09-15 DIAGNOSIS — E782 Mixed hyperlipidemia: Secondary | ICD-10-CM | POA: Diagnosis present

## 2017-09-15 DIAGNOSIS — Z951 Presence of aortocoronary bypass graft: Secondary | ICD-10-CM | POA: Diagnosis not present

## 2017-09-15 DIAGNOSIS — Z952 Presence of prosthetic heart valve: Secondary | ICD-10-CM

## 2017-09-15 DIAGNOSIS — I2510 Atherosclerotic heart disease of native coronary artery without angina pectoris: Secondary | ICD-10-CM | POA: Diagnosis present

## 2017-09-15 DIAGNOSIS — Z79899 Other long term (current) drug therapy: Secondary | ICD-10-CM

## 2017-09-15 DIAGNOSIS — I130 Hypertensive heart and chronic kidney disease with heart failure and stage 1 through stage 4 chronic kidney disease, or unspecified chronic kidney disease: Secondary | ICD-10-CM | POA: Diagnosis present

## 2017-09-15 DIAGNOSIS — N183 Chronic kidney disease, stage 3 unspecified: Secondary | ICD-10-CM | POA: Diagnosis present

## 2017-09-15 DIAGNOSIS — I350 Nonrheumatic aortic (valve) stenosis: Secondary | ICD-10-CM

## 2017-09-15 DIAGNOSIS — Z955 Presence of coronary angioplasty implant and graft: Secondary | ICD-10-CM | POA: Diagnosis not present

## 2017-09-15 DIAGNOSIS — Z7982 Long term (current) use of aspirin: Secondary | ICD-10-CM | POA: Diagnosis not present

## 2017-09-15 DIAGNOSIS — I447 Left bundle-branch block, unspecified: Secondary | ICD-10-CM | POA: Diagnosis present

## 2017-09-15 DIAGNOSIS — R001 Bradycardia, unspecified: Secondary | ICD-10-CM | POA: Diagnosis not present

## 2017-09-15 DIAGNOSIS — N189 Chronic kidney disease, unspecified: Secondary | ICD-10-CM | POA: Diagnosis present

## 2017-09-15 DIAGNOSIS — Z8673 Personal history of transient ischemic attack (TIA), and cerebral infarction without residual deficits: Secondary | ICD-10-CM | POA: Diagnosis not present

## 2017-09-15 DIAGNOSIS — Z888 Allergy status to other drugs, medicaments and biological substances status: Secondary | ICD-10-CM | POA: Diagnosis not present

## 2017-09-15 DIAGNOSIS — I779 Disorder of arteries and arterioles, unspecified: Secondary | ICD-10-CM | POA: Diagnosis present

## 2017-09-15 DIAGNOSIS — I10 Essential (primary) hypertension: Secondary | ICD-10-CM | POA: Diagnosis present

## 2017-09-15 DIAGNOSIS — I361 Nonrheumatic tricuspid (valve) insufficiency: Secondary | ICD-10-CM | POA: Diagnosis not present

## 2017-09-15 HISTORY — DX: Left bundle-branch block, unspecified: I44.7

## 2017-09-15 HISTORY — DX: Presence of prosthetic heart valve: Z95.2

## 2017-09-15 HISTORY — PX: TRANSCATHETER AORTIC VALVE REPLACEMENT, TRANSFEMORAL: SHX6400

## 2017-09-15 HISTORY — PX: TEE WITHOUT CARDIOVERSION: SHX5443

## 2017-09-15 HISTORY — DX: Personal history of transient ischemic attack (TIA), and cerebral infarction without residual deficits: Z86.73

## 2017-09-15 LAB — POCT I-STAT 3, ART BLOOD GAS (G3+)
Acid-base deficit: 3 mmol/L — ABNORMAL HIGH (ref 0.0–2.0)
Bicarbonate: 23 mmol/L (ref 20.0–28.0)
O2 SAT: 98 %
PCO2 ART: 39.6 mmHg (ref 32.0–48.0)
PH ART: 7.364 (ref 7.350–7.450)
PO2 ART: 98 mmHg (ref 83.0–108.0)
Patient temperature: 95.8
TCO2: 24 mmol/L (ref 22–32)

## 2017-09-15 LAB — POCT I-STAT, CHEM 8
BUN: 25 mg/dL — AB (ref 6–20)
BUN: 25 mg/dL — ABNORMAL HIGH (ref 6–20)
BUN: 25 mg/dL — ABNORMAL HIGH (ref 6–20)
CHLORIDE: 106 mmol/L (ref 101–111)
CHLORIDE: 105 mmol/L (ref 101–111)
CREATININE: 1.2 mg/dL (ref 0.61–1.24)
Calcium, Ion: 1.3 mmol/L (ref 1.15–1.40)
Calcium, Ion: 1.26 mmol/L (ref 1.15–1.40)
Calcium, Ion: 1.28 mmol/L (ref 1.15–1.40)
Chloride: 105 mmol/L (ref 101–111)
Creatinine, Ser: 1.2 mg/dL (ref 0.61–1.24)
Creatinine, Ser: 1.3 mg/dL — ABNORMAL HIGH (ref 0.61–1.24)
GLUCOSE: 125 mg/dL — AB (ref 65–99)
Glucose, Bld: 107 mg/dL — ABNORMAL HIGH (ref 65–99)
Glucose, Bld: 127 mg/dL — ABNORMAL HIGH (ref 65–99)
HCT: 30 % — ABNORMAL LOW (ref 39.0–52.0)
HEMATOCRIT: 29 % — AB (ref 39.0–52.0)
HEMATOCRIT: 29 % — AB (ref 39.0–52.0)
HEMOGLOBIN: 10.2 g/dL — AB (ref 13.0–17.0)
HEMOGLOBIN: 9.9 g/dL — AB (ref 13.0–17.0)
Hemoglobin: 9.9 g/dL — ABNORMAL LOW (ref 13.0–17.0)
POTASSIUM: 4.7 mmol/L (ref 3.5–5.1)
POTASSIUM: 5 mmol/L (ref 3.5–5.1)
Potassium: 4.5 mmol/L (ref 3.5–5.1)
SODIUM: 139 mmol/L (ref 135–145)
SODIUM: 139 mmol/L (ref 135–145)
Sodium: 139 mmol/L (ref 135–145)
TCO2: 23 mmol/L (ref 22–32)
TCO2: 23 mmol/L (ref 22–32)
TCO2: 23 mmol/L (ref 22–32)

## 2017-09-15 LAB — GLUCOSE, CAPILLARY: GLUCOSE-CAPILLARY: 118 mg/dL — AB (ref 65–99)

## 2017-09-15 LAB — CBC
HCT: 31.6 % — ABNORMAL LOW (ref 39.0–52.0)
HEMOGLOBIN: 10.5 g/dL — AB (ref 13.0–17.0)
MCH: 29.6 pg (ref 26.0–34.0)
MCHC: 33.2 g/dL (ref 30.0–36.0)
MCV: 89 fL (ref 78.0–100.0)
Platelets: 134 10*3/uL — ABNORMAL LOW (ref 150–400)
RBC: 3.55 MIL/uL — ABNORMAL LOW (ref 4.22–5.81)
RDW: 14.9 % (ref 11.5–15.5)
WBC: 7.2 10*3/uL (ref 4.0–10.5)

## 2017-09-15 LAB — PROTIME-INR
INR: 1.28
Prothrombin Time: 15.9 seconds — ABNORMAL HIGH (ref 11.4–15.2)

## 2017-09-15 LAB — POCT I-STAT 4, (NA,K, GLUC, HGB,HCT)
Glucose, Bld: 113 mg/dL — ABNORMAL HIGH (ref 65–99)
HEMATOCRIT: 29 % — AB (ref 39.0–52.0)
Hemoglobin: 9.9 g/dL — ABNORMAL LOW (ref 13.0–17.0)
Potassium: 4.7 mmol/L (ref 3.5–5.1)
Sodium: 139 mmol/L (ref 135–145)

## 2017-09-15 LAB — APTT: APTT: 34 s (ref 24–36)

## 2017-09-15 SURGERY — IMPLANTATION, AORTIC VALVE, TRANSCATHETER, FEMORAL APPROACH
Anesthesia: Monitor Anesthesia Care | Site: Chest

## 2017-09-15 MED ORDER — LACTATED RINGERS IV SOLN
INTRAVENOUS | Status: DC | PRN
  Administered 2017-09-15 (×3): via INTRAVENOUS

## 2017-09-15 MED ORDER — SODIUM CHLORIDE 0.9 % IV SOLN
0.0000 ug/min | INTRAVENOUS | Status: DC
  Filled 2017-09-15: qty 2

## 2017-09-15 MED ORDER — HEPARIN SODIUM (PORCINE) 1000 UNIT/ML IJ SOLN
INTRAMUSCULAR | Status: AC
  Filled 2017-09-15: qty 1

## 2017-09-15 MED ORDER — FENTANYL CITRATE (PF) 250 MCG/5ML IJ SOLN
INTRAMUSCULAR | Status: AC
  Filled 2017-09-15: qty 5

## 2017-09-15 MED ORDER — CLOPIDOGREL BISULFATE 75 MG PO TABS
75.0000 mg | ORAL_TABLET | Freq: Every day | ORAL | Status: DC
  Administered 2017-09-15 – 2017-09-16 (×2): 75 mg via ORAL
  Filled 2017-09-15 (×2): qty 1

## 2017-09-15 MED ORDER — PROTAMINE SULFATE 10 MG/ML IV SOLN
INTRAVENOUS | Status: AC
  Filled 2017-09-15: qty 25

## 2017-09-15 MED ORDER — ROSUVASTATIN CALCIUM 20 MG PO TABS
20.0000 mg | ORAL_TABLET | Freq: Every day | ORAL | Status: DC
  Administered 2017-09-15 – 2017-09-16 (×2): 20 mg via ORAL
  Filled 2017-09-15 (×2): qty 1

## 2017-09-15 MED ORDER — VANCOMYCIN HCL IN DEXTROSE 1-5 GM/200ML-% IV SOLN
1000.0000 mg | Freq: Once | INTRAVENOUS | Status: AC
  Administered 2017-09-15: 1000 mg via INTRAVENOUS
  Filled 2017-09-15: qty 200

## 2017-09-15 MED ORDER — ACETAMINOPHEN 500 MG PO TABS
1000.0000 mg | ORAL_TABLET | Freq: Four times a day (QID) | ORAL | Status: DC
  Administered 2017-09-15 – 2017-09-16 (×4): 1000 mg via ORAL
  Filled 2017-09-15 (×4): qty 2

## 2017-09-15 MED ORDER — IODIXANOL 320 MG/ML IV SOLN
INTRAVENOUS | Status: DC | PRN
  Administered 2017-09-15: 150 mL via INTRAVENOUS

## 2017-09-15 MED ORDER — OMEGA-3 FATTY ACIDS 1000 MG PO CAPS: 1.0000 g | ORAL_CAPSULE | Freq: Two times a day (BID) | ORAL | Status: DC

## 2017-09-15 MED ORDER — CHLORHEXIDINE GLUCONATE 4 % EX LIQD: 60.0000 mL | Freq: Once | CUTANEOUS | Status: DC

## 2017-09-15 MED ORDER — ALBUMIN HUMAN 5 % IV SOLN: 250.0000 mL | INTRAVENOUS | Status: DC | PRN

## 2017-09-15 MED ORDER — DEXTROSE 5 % IV SOLN
1.5000 g | Freq: Two times a day (BID) | INTRAVENOUS | Status: AC
  Administered 2017-09-15 – 2017-09-17 (×4): 1.5 g via INTRAVENOUS
  Filled 2017-09-15 (×4): qty 1.5

## 2017-09-15 MED ORDER — ASPIRIN EC 81 MG PO TBEC
81.0000 mg | DELAYED_RELEASE_TABLET | Freq: Every day | ORAL | Status: DC
  Administered 2017-09-15 – 2017-09-16 (×2): 81 mg via ORAL
  Filled 2017-09-15 (×2): qty 1

## 2017-09-15 MED ORDER — SODIUM CHLORIDE 0.9 % IV SOLN
INTRAVENOUS | Status: DC
  Administered 2017-09-15: 11:00:00 via INTRAVENOUS

## 2017-09-15 MED ORDER — MIDAZOLAM HCL 2 MG/2ML IJ SOLN: 2.0000 mg | INTRAMUSCULAR | Status: DC | PRN

## 2017-09-15 MED ORDER — ONDANSETRON HCL 4 MG/2ML IJ SOLN: 4.0000 mg | Freq: Four times a day (QID) | INTRAMUSCULAR | Status: DC | PRN

## 2017-09-15 MED ORDER — NITROGLYCERIN IN D5W 200-5 MCG/ML-% IV SOLN: 0.0000 ug/min | INTRAVENOUS | Status: DC

## 2017-09-15 MED ORDER — NIACIN ER (ANTIHYPERLIPIDEMIC) 500 MG PO TBCR
500.0000 mg | EXTENDED_RELEASE_TABLET | Freq: Every day | ORAL | Status: DC
  Administered 2017-09-15 – 2017-09-16 (×2): 500 mg via ORAL
  Filled 2017-09-15 (×2): qty 1

## 2017-09-15 MED ORDER — PROTAMINE SULFATE 10 MG/ML IV SOLN
INTRAVENOUS | Status: DC | PRN
  Administered 2017-09-15 (×2): 30 mg via INTRAVENOUS
  Administered 2017-09-15: 10 mg via INTRAVENOUS
  Administered 2017-09-15: 30 mg via INTRAVENOUS
  Administered 2017-09-15: 20 mg via INTRAVENOUS

## 2017-09-15 MED ORDER — LACTATED RINGERS IV SOLN: 500.0000 mL | Freq: Once | INTRAVENOUS | Status: DC | PRN

## 2017-09-15 MED ORDER — PANTOPRAZOLE SODIUM 40 MG PO TBEC
40.0000 mg | DELAYED_RELEASE_TABLET | Freq: Every day | ORAL | Status: DC
  Administered 2017-09-15 – 2017-09-17 (×3): 40 mg via ORAL
  Filled 2017-09-15 (×3): qty 1

## 2017-09-15 MED ORDER — OXYCODONE HCL 5 MG PO TABS: 5.0000 mg | ORAL_TABLET | ORAL | Status: DC | PRN

## 2017-09-15 MED ORDER — PROPOFOL 10 MG/ML IV BOLUS
INTRAVENOUS | Status: AC
  Filled 2017-09-15: qty 20

## 2017-09-15 MED ORDER — TRAMADOL HCL 50 MG PO TABS: 50.0000 mg | ORAL_TABLET | ORAL | Status: DC | PRN

## 2017-09-15 MED ORDER — OMEGA-3-ACID ETHYL ESTERS 1 G PO CAPS
1.0000 g | ORAL_CAPSULE | Freq: Two times a day (BID) | ORAL | Status: DC
  Administered 2017-09-15 – 2017-09-17 (×4): 1 g via ORAL
  Filled 2017-09-15 (×5): qty 1

## 2017-09-15 MED ORDER — METOPROLOL TARTRATE 5 MG/5ML IV SOLN: 2.5000 mg | INTRAVENOUS | Status: DC | PRN

## 2017-09-15 MED ORDER — HEPARIN SODIUM (PORCINE) 1000 UNIT/ML IJ SOLN
INTRAMUSCULAR | Status: DC | PRN
  Administered 2017-09-15: 12000 [IU] via INTRAVENOUS

## 2017-09-15 MED ORDER — MORPHINE SULFATE (PF) 4 MG/ML IV SOLN: 2.0000 mg | INTRAVENOUS | Status: DC | PRN

## 2017-09-15 MED ORDER — ACETAMINOPHEN 160 MG/5ML PO SOLN: 1000.0000 mg | Freq: Four times a day (QID) | ORAL | Status: DC

## 2017-09-15 MED ORDER — MIDAZOLAM HCL 2 MG/2ML IJ SOLN
INTRAMUSCULAR | Status: AC
  Filled 2017-09-15: qty 2

## 2017-09-15 MED ORDER — LIDOCAINE HCL 1 % IJ SOLN
INTRAMUSCULAR | Status: DC | PRN
  Administered 2017-09-15: 30 mL

## 2017-09-15 MED ORDER — SODIUM CHLORIDE 0.9 % IV SOLN
INTRAVENOUS | Status: DC | PRN
  Administered 2017-09-15 (×3): 500 mL

## 2017-09-15 MED ORDER — MIDAZOLAM HCL 5 MG/5ML IJ SOLN
INTRAMUSCULAR | Status: DC | PRN
  Administered 2017-09-15: 1 mg via INTRAVENOUS

## 2017-09-15 MED ORDER — PROPOFOL 500 MG/50ML IV EMUL
INTRAVENOUS | Status: DC | PRN
  Administered 2017-09-15: 10 ug/kg/min via INTRAVENOUS

## 2017-09-15 MED ORDER — LIDOCAINE HCL (PF) 1 % IJ SOLN
INTRAMUSCULAR | Status: AC
  Filled 2017-09-15: qty 30

## 2017-09-15 MED ORDER — FENTANYL CITRATE (PF) 250 MCG/5ML IJ SOLN
INTRAMUSCULAR | Status: DC | PRN
  Administered 2017-09-15: 50 ug via INTRAVENOUS

## 2017-09-15 MED ORDER — 0.9 % SODIUM CHLORIDE (POUR BTL) OPTIME
TOPICAL | Status: DC | PRN
  Administered 2017-09-15: 1000 mL

## 2017-09-15 SURGICAL SUPPLY — 101 items
ADAPTER UNIV SWAN GANZ BIP (ADAPTER) ×1 IMPLANT
ADAPTER UNV SWAN GANZ BIP (ADAPTER) ×2
ADH SKN CLS APL DERMABOND .7 (GAUZE/BANDAGES/DRESSINGS) ×1
ADPR CATH UNV NS SG CATH (ADAPTER) ×1
BAG BANDED W/RUBBER/TAPE 36X54 (MISCELLANEOUS) ×3 IMPLANT
BAG DECANTER FOR FLEXI CONT (MISCELLANEOUS) IMPLANT
BAG EQP BAND 135X91 W/RBR TAPE (MISCELLANEOUS) ×1
BAG SNAP BAND KOVER 36X36 (MISCELLANEOUS) ×6 IMPLANT
BLADE CLIPPER SURG (BLADE) IMPLANT
BLADE OSCILLATING /SAGITTAL (BLADE) IMPLANT
BLADE STERNUM SYSTEM 6 (BLADE) ×3 IMPLANT
CABLE ADAPT CONN TEMP 6FT (ADAPTER) ×3 IMPLANT
CANNULA FEM VENOUS REMOTE 22FR (CANNULA) IMPLANT
CANNULA OPTISITE PERFUSION 16F (CANNULA) IMPLANT
CANNULA OPTISITE PERFUSION 18F (CANNULA) IMPLANT
CATH DIAG EXPO 6F VENT PIG 145 (CATHETERS) ×6 IMPLANT
CATH EXPO 5FR AL1 (CATHETERS) ×5 IMPLANT
CATH S G BIP PACING (SET/KITS/TRAYS/PACK) ×6 IMPLANT
CATH TEMPO TEMP PACE LEAD CRVE (CATHETERS) ×2 IMPLANT
CLIP VESOCCLUDE MED 24/CT (CLIP) IMPLANT
CLIP VESOCCLUDE SM WIDE 24/CT (CLIP) IMPLANT
CONT SPEC 4OZ CLIKSEAL STRL BL (MISCELLANEOUS) ×18 IMPLANT
COVER BACK TABLE 24X17X13 BIG (DRAPES) ×3 IMPLANT
COVER BACK TABLE 60X90IN (DRAPES) ×6 IMPLANT
COVER BACK TABLE 80X110 HD (DRAPES) ×3 IMPLANT
COVER DOME SNAP 22 D (MISCELLANEOUS) ×3 IMPLANT
COVER MAYO STAND STRL (DRAPES) ×3 IMPLANT
COVER PROBE W GEL 5X96 (DRAPES) ×3 IMPLANT
CRADLE DONUT ADULT HEAD (MISCELLANEOUS) ×3 IMPLANT
DERMABOND ADVANCED (GAUZE/BANDAGES/DRESSINGS) ×2
DERMABOND ADVANCED .7 DNX12 (GAUZE/BANDAGES/DRESSINGS) ×1 IMPLANT
DEVICE CLOSURE PERCLS PRGLD 6F (VASCULAR PRODUCTS) ×2 IMPLANT
DRAPE INCISE IOBAN 66X45 STRL (DRAPES) IMPLANT
DRAPE SLUSH MACHINE 52X66 (DRAPES) ×3 IMPLANT
DRSG TEGADERM 4X4.75 (GAUZE/BANDAGES/DRESSINGS) ×3 IMPLANT
ELECT REM PT RETURN 9FT ADLT (ELECTROSURGICAL) ×6
ELECTRODE REM PT RTRN 9FT ADLT (ELECTROSURGICAL) ×2 IMPLANT
FELT TEFLON 6X6 (MISCELLANEOUS) IMPLANT
FEMORAL VENOUS CANN RAP (CANNULA) IMPLANT
GAUZE SPONGE 4X4 12PLY STRL (GAUZE/BANDAGES/DRESSINGS) ×3 IMPLANT
GAUZE SPONGE 4X4 12PLY STRL LF (GAUZE/BANDAGES/DRESSINGS) ×2 IMPLANT
GLOVE BIO SURGEON STRL SZ7.5 (GLOVE) ×3 IMPLANT
GLOVE BIO SURGEON STRL SZ8 (GLOVE) ×3 IMPLANT
GLOVE EUDERMIC 7 POWDERFREE (GLOVE) ×6 IMPLANT
GLOVE ORTHO TXT STRL SZ7.5 (GLOVE) ×6 IMPLANT
GOWN STRL REUS W/ TWL LRG LVL3 (GOWN DISPOSABLE) ×3 IMPLANT
GOWN STRL REUS W/ TWL XL LVL3 (GOWN DISPOSABLE) ×6 IMPLANT
GOWN STRL REUS W/TWL LRG LVL3 (GOWN DISPOSABLE) ×9
GOWN STRL REUS W/TWL XL LVL3 (GOWN DISPOSABLE) ×18
GUIDEWIRE SAFE TJ AMPLATZ EXST (WIRE) ×3 IMPLANT
GUIDEWIRE STRAIGHT .035 260CM (WIRE) ×3 IMPLANT
INSERT FOGARTY 61MM (MISCELLANEOUS) IMPLANT
INSERT FOGARTY SM (MISCELLANEOUS) IMPLANT
INSERT FOGARTY XLG (MISCELLANEOUS) IMPLANT
KIT BASIN OR (CUSTOM PROCEDURE TRAY) ×3 IMPLANT
KIT DILATOR VASC 18G NDL (KITS) IMPLANT
KIT HEART LEFT (KITS) ×3 IMPLANT
KIT ROOM TURNOVER OR (KITS) ×3 IMPLANT
KIT SUCTION CATH 14FR (SUCTIONS) IMPLANT
NDL PERC 18GX7CM (NEEDLE) ×1 IMPLANT
NEEDLE 22X1 1/2 (OR ONLY) (NEEDLE) IMPLANT
NEEDLE PERC 18GX7CM (NEEDLE) ×3 IMPLANT
NS IRRIG 1000ML POUR BTL (IV SOLUTION) ×9 IMPLANT
PACK AORTA (CUSTOM PROCEDURE TRAY) ×3 IMPLANT
PAD ARMBOARD 7.5X6 YLW CONV (MISCELLANEOUS) ×6 IMPLANT
PAD ELECT DEFIB RADIOL ZOLL (MISCELLANEOUS) ×3 IMPLANT
PERCLOSE PROGLIDE 6F (VASCULAR PRODUCTS) ×6
SET MICROPUNCTURE 5F STIFF (MISCELLANEOUS) ×3 IMPLANT
SHEATH AVANTI 11CM 8FR (MISCELLANEOUS) ×3 IMPLANT
SHEATH PINNACLE 6F 10CM (SHEATH) ×6 IMPLANT
SLEEVE REPOSITIONING LENGTH 30 (MISCELLANEOUS) ×3 IMPLANT
SPONGE LAP 4X18 X RAY DECT (DISPOSABLE) IMPLANT
STOPCOCK MORSE 400PSI 3WAY (MISCELLANEOUS) ×18 IMPLANT
SUT ETHIBOND X763 2 0 SH 1 (SUTURE) ×3 IMPLANT
SUT GORETEX CV 4 TH 22 36 (SUTURE) ×3 IMPLANT
SUT GORETEX CV4 TH-18 (SUTURE) ×9 IMPLANT
SUT GORETEX TH-18 36 INCH (SUTURE) ×6 IMPLANT
SUT MNCRL AB 3-0 PS2 18 (SUTURE) ×3 IMPLANT
SUT PROLENE 3 0 SH1 36 (SUTURE) IMPLANT
SUT PROLENE 4 0 RB 1 (SUTURE) ×3
SUT PROLENE 4-0 RB1 .5 CRCL 36 (SUTURE) ×1 IMPLANT
SUT PROLENE 5 0 C 1 36 (SUTURE) ×6 IMPLANT
SUT PROLENE 6 0 C 1 30 (SUTURE) ×6 IMPLANT
SUT SILK  1 MH (SUTURE) ×2
SUT SILK 1 MH (SUTURE) ×1 IMPLANT
SUT SILK 2 0 SH CR/8 (SUTURE) IMPLANT
SUT VIC AB 2-0 CT1 27 (SUTURE) ×3
SUT VIC AB 2-0 CT1 TAPERPNT 27 (SUTURE) ×1 IMPLANT
SUT VIC AB 2-0 CTX 36 (SUTURE) IMPLANT
SUT VIC AB 3-0 SH 8-18 (SUTURE) ×6 IMPLANT
SYR 10ML LL (SYRINGE) ×9 IMPLANT
SYR 30ML LL (SYRINGE) ×6 IMPLANT
SYR 50ML LL SCALE MARK (SYRINGE) ×3 IMPLANT
SYR CONTROL 10ML LL (SYRINGE) IMPLANT
TOWEL OR 17X26 10 PK STRL BLUE (TOWEL DISPOSABLE) ×6 IMPLANT
TRANSDUCER W/STOPCOCK (MISCELLANEOUS) ×6 IMPLANT
TRAY FOLEY SILVER 16FR TEMP (SET/KITS/TRAYS/PACK) ×3 IMPLANT
TUBING HIGH PRESSURE 120CM (CONNECTOR) ×3 IMPLANT
VALVE HEART TRANSCATH SZ3 26MM (Prosthesis & Implant Heart) ×2 IMPLANT
WIRE AMPLATZ SS-J .035X180CM (WIRE) ×3 IMPLANT
WIRE MINI STICK MAX (SHEATH) IMPLANT

## 2017-09-15 NOTE — Progress Notes (Signed)
  Echocardiogram 2D Echocardiogram has been performed.  Darlina Sicilian M 09/15/2017, 9:26 AM

## 2017-09-15 NOTE — Progress Notes (Signed)
  [name] VALVE TEAM  Patient doing well s/p TAVR. He is hemodynamically stable. Groin sites stable. ECG with no high grade block. Plan to DC arterial line. Early ambulation and plan to transfer to tele tomorrow.   Angelena Form PA-C  [facility]  Pager [phone]

## 2017-09-15 NOTE — Interval H&P Note (Signed)
History and Physical Interval Note:  09/15/2017 6:11 AM  [name]  has presented today for surgery, with the diagnosis of severe aortic stenosis  The various methods of treatment have been discussed with the patient and family. After consideration of risks, benefits and other options for treatment, the patient has consented to  Procedure(s): TRANSCATHETER AORTIC VALVE REPLACEMENT, TRANSFEMORAL (N/A) TRANSESOPHAGEAL ECHOCARDIOGRAM (TEE) (N/A) as a surgical intervention .  The patient's history has been reviewed, patient examined, no change in status, stable for surgery.  I have reviewed the patient's chart and labs.  Questions were answered to the patient's satisfaction.     Gaye Pollack

## 2017-09-15 NOTE — Op Note (Signed)
[facility]   MULTIDISCIPLINARY HEART VALVE TEAM   TAVR OPERATIVE NOTE   Date of Procedure:  09/15/2017  Preoperative Diagnosis: Severe Aortic Stenosis   Postoperative Diagnosis: Same   Procedure:    Transcatheter Aortic Valve Replacement - Percutaneous Right Transfemoral Approach  Edwards Sapien 3 THV (size 26 mm, model # 9600TFX, serial # [id number])   Co-Surgeons:  Gaye Pollack, MD and Lauree Chandler, MD    Anesthesiologist:  Suann Larry, MD  Echocardiographer:  Ena Dawley, MD  Pre-operative Echo Findings:  Severe aortic stenosis  Normal left ventricular systolic function  Post-operative Echo Findings:  No paravalvular leak  Normal left ventricular systolic function   BRIEF CLINICAL NOTE AND INDICATIONS FOR SURGERY  The patient is an active 84 year old gentleman with a history of hypertension, coronary artery disease status post coronary artery bypass graft surgery in 1996, carotid artery disease status post bilateral carotid endarterectomies in 2008, previous stroke in 2008, and stage III chronic kidney disease. He underwent multiple re-interventions on the ostium of the right coronary artery in 2013 for restenosis with recurrent chest discomfort. He has done well over the years since then but was diagnosed with aortic stenosis that has been followed by echocardiogram. He has been followed by Dr. Debara Pickett and reports that over the past several months he has had increasing shortness of breath, fatigue, and some left-sided chest discomfort. This does not happen with his normal daily activities but does with any significant exertion. His symptoms have always been relieved with rest. He has had no symptoms at rest. He denies orthopnea and PND. He has had no dizziness or syncope. His most recent echocardiogram on 05/07/2017 showed a trileaflet aortic valve with severe calcification of the leaflets and a mean transvalvular gradient of 31 mmHg. The  peak gradient was 56 mmHg. The peak velocity ratio was 0.29. The aortic valve area was measured at 1.1 cm. The left ventricular ejection fraction was 55-60% with grade 1 diastolic dysfunction. He underwent cardiac catheterization on 07/30/2017 which showed severe native vessel coronary disease with total occlusion of the LAD and left circumflex coronary arteries. There is 75% stenosis in the distal left main and ostium of the LAD within the previously placed stent. There was a widely patent left internal mammary graft to the LAD. There was a patent vein graft to the first obtuse marginal and a patent vein graft to the distal obtuse marginal. The right coronary artery was stented from the ostium to the proximal portion. There was some overhang of the ostial stent into the right sinus of Valsalva preventing selective engagement. The right coronary artery had an eccentric 50-70% mid vessel stenosis as well as 70% distal stenosis. There is 85% stenosis in the posterior lateral branch. The mean aortic transvalvular gradient was 30 mmHg with a calculated aortic valve area of 0.95 cm. Right heart pressures were normal.   He has stage D severe symptomatic aortic stenosis and multivessel coronary artery disease status post coronary artery bypass grafting in 1996 and status post PCI and stenting of the right coronary artery in 2013. He presents with progressive symptoms of exertional shortness of breath and chest tightness consistent with chronic diastolic congestive heart failure and stable angina pectoris, [place] Heart Association functional class II. I have personally reviewed the patient's most recent transthoracic echocardiogram, diagnostic cardiac catheterization, and CT angiograms,and his echocardiogram and catheterization were both reviewed by the heart valve team. Echocardiogram demonstrates the presence of severe aortic stenosis. Peak velocity across  the aortic valve measured close to 4 m/s with mean  transvalvular gradient estimated greater than 40 mmHg despite the presence of mild left ventricular systolic dysfunction with ejection fraction 55%. All 3 leaflets of the aortic valve are heavily calcified, thickened, and demonstrate severely restricted leaflet mobility. Mean transvalvular gradient measured at the time of catheterization was greater than 30 mmHg, corresponding to aortic valve area calculated 0.95 cm. Catheterization revealed chronic occlusion of both the left anterior descending coronary artery and left circumflex coronary arteries but continued patency without significant disease involving the previous bypass grafts placed to the left anterior descending coronary artery andboth the first and second obtuse marginal branches of the left circumflex coronary artery. The right coronary artery is diffusely diseased with moderate in-stent restenosis and 85% proximal stenosis involving the relatively small posterolateral branch off of the distal right coronary artery. I agree the patient would benefit from aortic valve replacement. Redo coronary artery bypass grafting could be considered to revascularize the right coronary artery, but the terminal branches of the right coronary artery are relatively small and somewhat diffusely diseased. Under the circumstances risks associated with conventional surgery would be at least moderately elevated.Cardiac-gated CTA of the heart reveals anatomical characteristics consistent with aortic stenosis suitable for treatment by transcatheter aortic valve replacement without any significant complicating features and CTA of the aorta and iliac vessels demonstrate what appears to be adequate pelvic vascular access to facilitate a transfemoral approach.  The patient has been advised of a variety of complications that might develop including but not limited to risks of death, stroke, paravalvular leak, aortic dissection or other major vascular complications,  aortic annulus rupture, device embolization, cardiac rupture or perforation, mitral regurgitation, acute myocardial infarction, arrhythmia, heart block or bradycardia requiring permanent pacemaker placement, congestive heart failure, respiratory failure, renal failure, pneumonia, infection, other late complications related to structural valve deterioration or migration, or other complications that might ultimately cause a temporary or permanent loss of functional independence or other long term morbidity. The patient provides full informed consent for the procedure as described and all questions were answered.       DETAILS OF THE OPERATIVE PROCEDURE  PREPARATION:    The patient is brought to the operating room on the above mentioned date and central monitoring was established by the anesthesia team including placement of a central venous line and radial arterial line. The patient is placed in the supine position on the operating table.  Intravenous antibiotics are administered. The patient is monitored closely throughout the procedure under conscious sedation.  Baseline transthoracic echocardiogram was performed. The patient's chest, abdomen, both groins, and both lower extremities are prepared and draped in a sterile manner. A time out procedure is performed.   PERIPHERAL ACCESS:    Using the modified Seldinger technique, femoral arterial and venous access was obtained with placement of 6 Fr sheaths on the left side.  A pigtail diagnostic catheter was passed through the left arterial sheath under fluoroscopic guidance into the aortic root.  A temporary transvenous pacemaker catheter was passed through the left femoral venous sheath under fluoroscopic guidance into the right ventricle.  The pacemaker was tested to ensure stable lead placement and pacemaker capture. Aortic root angiography was performed in order to determine the optimal angiographic angle for valve deployment.   TRANSFEMORAL  ACCESS:   Percutaneous transfemoral access and sheath placement was performed by Dr. Angelena Form using ultrasound guidance.  The right common femoral artery was cannulated using a micropuncture needle and appropriate location  was verified using hand injection angiogram.  A pair of Abbott Perclose percutaneous closure devices were placed and a 6 French sheath replaced into the femoral artery.  The patient was heparinized systemically and ACT verified > 250 seconds.    A 14 Fr transfemoral E-sheath was introduced into the right femoral artery after progressively dilating over an Amplatz superstiff wire. An AL-2 catheter was used to direct a straight-tip exchange length wire across the native aortic valve into the left ventricle. This was exchanged out for a pigtail catheter and position was confirmed in the LV apex. Simultaneous LV and Ao pressures were recorded.  The pigtail catheter was exchanged for an Amplatz Extra-stiff wire in the LV apex.  Echocardiography was utilized to confirm appropriate wire position and no sign of entanglement in the mitral subvalvular apparatus.   BALLOON AORTIC VALVULOPLASTY:   Not performed  TRANSCATHETER HEART VALVE DEPLOYMENT:   An Edwards Sapien 3 transcatheter heart valve (size 26 mm, model #9600TFX, serial #[id number]) was prepared and crimped per manufacturer's guidelines, and the proper orientation of the valve is confirmed on the [employer] delivery system. The valve was advanced through the introducer sheath using normal technique until in an appropriate position in the abdominal aorta beyond the sheath tip. The balloon was then retracted and using the fine-tuning wheel was centered on the valve. The valve was then advanced across the aortic arch using appropriate flexion of the catheter. The valve was carefully positioned across the aortic valve annulus. The Commander catheter was retracted using normal technique. Once final position of the valve has been  confirmed by angiographic assessment, the valve is deployed while temporarily holding ventilation and during rapid ventricular pacing to maintain systolic blood pressure < 50 mmHg and pulse pressure < 10 mmHg. The balloon inflation is held for >3 seconds after reaching full deployment volume. Once the balloon has fully deflated the balloon is retracted into the ascending aorta and valve function is assessed using echocardiography. There is felt to be no paravalvular leak and no central aortic insufficiency.  The patient's hemodynamic recovery following valve deployment is good.  The deployment balloon and guidewire are both removed.    PROCEDURE COMPLETION:   The sheath was removed and femoral artery closure performed by Dr Angelena Form.  Protamine was administered once femoral arterial repair was complete. The temporary pacemaker, pigtail catheters and femoral sheaths were removed with manual pressure used for hemostasis.   The patient tolerated the procedure well and is transported to the surgical intensive care in stable condition. There were no immediate intraoperative complications. All sponge instrument and needle counts are verified correct at completion of the operation.   No blood products were administered during the operation.  The patient received a total of 21.2 mL of intravenous contrast during the procedure.   Gaye Pollack, MD 09/15/2017 10:07 AM

## 2017-09-15 NOTE — Progress Notes (Signed)
  Echocardiogram 2D Echocardiogram has been performed.  [name] M 09/15/2017, 9:29 AM

## 2017-09-15 NOTE — Progress Notes (Signed)
[name] is a pleasant 84 yo male with history of HTN, CAD s/p CABG, carotid artery disease, CVA and CKD with severe AS here today for TAVR.  He has had progressive SOB.  Aortic stenosis is now severe.  Cardiac cath with 3 patent bypass grafts. Patent RCA stent with moderate disease.  CT scans suggest a 26 mm Edwards Sapien 3 valve from the right transfemoral approach.  Creat 1.58.   Lauree Chandler 09/15/2017 7:26 AM

## 2017-09-15 NOTE — H&P (Signed)
[address]       [place] [address]             [phone]      Cardiothoracic Surgery Admission History and Physical   Referring Provider is [name], Nadean Corwin, MD  PCP is Deland Pretty, MD      Chief Complaint  Patient presents with severe aortic stenosis          HPI:  The patient is an active 84 year old gentleman with a history of hypertension, coronary artery disease status post coronary artery bypass graft surgery in 1996, carotid artery disease status post bilateral carotid endarterectomies in 2008, previous stroke in 2008, and stage III chronic kidney disease. He underwent multiple re-interventions on the ostium of the right coronary artery in 2013 for restenosis with recurrent chest discomfort. He has done well over the years since then but was diagnosed with aortic stenosis that has been followed by echocardiogram. He has been followed by Dr. Debara Pickett and reports that over the past several months he has had increasing shortness of breath, fatigue, and some left-sided chest discomfort. This does not happen with his normal daily activities but does with any significant exertion. His symptoms have always been relieved with rest. He has had no symptoms at rest. He denies orthopnea and PND. He has had no dizziness or syncope. His most recent echocardiogram on 05/07/2017 showed a trileaflet aortic valve with severe calcification of the leaflets and a mean transvalvular gradient of 31 mmHg. The peak gradient was 56 mmHg. The peak velocity ratio was 0.29. The aortic valve area was measured at 1.1 cm. The left ventricular ejection fraction was 55-60% with grade 1 diastolic dysfunction. He underwent cardiac catheterization on 07/30/2017 which showed severe native vessel coronary disease with total occlusion of the LAD and left circumflex coronary arteries. There is 75% stenosis in the distal left main and ostium of the LAD within the previously placed stent. There was a  widely patent left internal mammary graft to the LAD. There was a patent vein graft to the first obtuse marginal and a patent vein graft to the distal obtuse marginal. The right coronary artery was stented from the ostium to the proximal portion. There was some overhang of the ostial stent into the right sinus of Valsalva preventing selective engagement. The right coronary artery had an eccentric 50-70% mid vessel stenosis as well as 70% distal stenosis. There is 85% stenosis in the posterior lateral branch. The mean aortic transvalvular gradient was 30 mmHg with a calculated aortic valve area of 0.95 cm. Right heart pressures were normal.  The patient remains physically active but has decreased his activity to avoid having his symptoms. He continues to go deer hunting.      Past Medical History:  Diagnosis Date  . Abnormal cardiovascular stress test, 11/2011 wint inferolateral reveribility 12/22/2011  . Angina effort, 12/2011 with DOE anginal equivlent 12/22/2011  . Blood transfusion    " no reaction to transfusion "  . CAD (coronary artery disease), CABG 1996. 09/23/2011   2D Echo - EF >55%, moderate calcification of the aortic valve leaflets  . Carotid artery disease, Rt. CEA, 08/04/07, Lt. CEA 04/26/07. 12/23/2011  . Chronic kidney disease   . CKD (chronic kidney disease) stage 2, GFR 60-89 ml/min 09/16/2011  . Coronary artery disease   . Crescendo angina ([place]) 06/09/2012  . CVA (cerebral vascular accident) ([place]) 04/22/2007   2D Echo - EF 50-55%, aortic valve thickness mild-moderately  increased,  . Dyspnea on exertion, anginal equivilant 10/16/2010   2D Echo - EF >55%, mild-moderate mitral regurgiation, mild-moderate tricuspid regurgitation, moderate calcification of the aortic leaflets  . Dysrhythmia    LLB anf leaking valve per patient  . HTN (hypertension),uncontrolled 09/16/2011  . Hypertension   . Peripheral arterial disease ([place])   . Pulmonary hypertension, mild 09/17/2011  . Recurrent angina  status post rotational atherectomy, with PTCA for ISR to RCA, 12/22/2011 12/22/2011  . Shortness of breath   . Stroke ([place])    hx of TIA        Past Surgical History:  Procedure Laterality Date  . Fluid knee Right Sept. 6, 2014   Fluid removed  . CARDIAC CATHETERIZATION Left 06/10/2012   Ostial RCA 99% stenosis pre-dilation 3x25mm Angiosculpt PTCA balloon, 3.5x65mm Promus Element DES overlapping proximal stent and into the Aorto-ostium, post-dilated with4x82mm Allen Trek balloon - 99% stenosis reduced to 0%  . CARDIAC CATHETERIZATION Left 12/22/2011   Left Main-diffuse tapering 50-60% stenosis; mild-moderate diseaseof the right ventricle branch of the right coronary artery; will plan FFR of ostial RCA  . CARDIAC CATHETERIZATION Bilateral 09/16/2011   Severe 90% ostial RCA disease which is calcified, will likely need PCI to ostial RCA with calcification  . CARDIAC CATHETERIZATION  09/18/2011   Calcified ostial 90-95% proximal RCA stenosis, 3x37mm Emerge balloon predilation, 3x66mm DES PROMUS Element stent was placed, post stent dilation done utilizing a 3.25x39mm noncompliant Quantum Apex balloon, 90-95% stenosis reduced to 0%  . CEA  2008  . CORONARY ANGIOPLASTY    . CORONARY ARTERY BYPASS GRAFT  08/04/2007  . FRACTIONAL FLOW RESERVE WIRE  12/22/2011   Ostial RCA, FFR ratio-0.82, physiologically significant, 3.25x75mm Flextome Cutting balloon deployed at ~3.56mm final estimated diameter  . FRACTIONAL FLOW RESERVE WIRE Right 12/22/2011   Procedure: FRACTIONAL FLOW RESERVE WIRE; Surgeon: Pixie Casino, MD; Location: [facility] CATH LAB; Service: Cardiovascular; Laterality: Right;  . LEFT AND RIGHT HEART CATHETERIZATION WITH CORONARY ANGIOGRAM Left 09/16/2011   Procedure: LEFT AND RIGHT HEART CATHETERIZATION WITH CORONARY ANGIOGRAM; Surgeon: Pixie Casino, MD; Location: [facility] CATH LAB; Service: Cardiovascular; Laterality: Left;  . LEFT HEART CATHETERIZATION WITH CORONARY ANGIOGRAM N/A 06/10/2012   Procedure: LEFT  HEART CATHETERIZATION WITH CORONARY ANGIOGRAM; Surgeon: Pixie Casino, MD; Location: [facility] CATH LAB; Service: Cardiovascular; Laterality: N/A;  . LEFT HEART CATHETERIZATION WITH CORONARY/GRAFT ANGIOGRAM N/A 12/22/2011   Procedure: LEFT HEART CATHETERIZATION WITH Beatrix Fetters; Surgeon: Pixie Casino, MD; Location: [facility] CATH LAB; Service: Cardiovascular; Laterality: N/A;  . PERCUTANEOUS CORONARY ROTOBLATOR INTERVENTION (PCI-R)  09/18/2011   Procedure: PERCUTANEOUS CORONARY ROTOBLATOR INTERVENTION (PCI-R); Surgeon: Troy Sine, MD; Location: [facility] CATH LAB; Service: Cardiovascular;;  . PERCUTANEOUS CORONARY STENT INTERVENTION (PCI-S) N/A 09/18/2011   Procedure: PERCUTANEOUS CORONARY STENT INTERVENTION (PCI-S); Surgeon: Troy Sine, MD; Location: [facility] CATH LAB; Service: Cardiovascular; Laterality: N/A;  . RIGHT/LEFT HEART CATH AND CORONARY/GRAFT ANGIOGRAPHY N/A 07/30/2017   Procedure: RIGHT/LEFT HEART CATH AND CORONARY/GRAFT ANGIOGRAPHY; Surgeon: Belva Crome, MD; Location: [facility] CV LAB; Service: Cardiovascular; Laterality: N/A;  . TEMPORARY PACEMAKER INSERTION  09/18/2011   Procedure: TEMPORARY PACEMAKER INSERTION; Surgeon: Troy Sine, MD; Location: [facility] CATH LAB; Service: Cardiovascular;;        Family History  Problem Relation Age of Onset  . Cancer Mother   . Heart disease Mother   . Stroke Mother   . Alcohol abuse Father   . Depression Father   . Alzheimer's disease Father   . Heart disease Brother  Heart bypass in 1996  . Cancer Brother    Prostate  . Cancer Sister    Social History        Socioeconomic History  . Marital status: Married    Spouse name: Not on file  . Number of children: Not on file  . Years of education: Not on file  . Highest education level: Not on file  Social Needs  . Financial resource strain: Not on file  . Food insecurity - worry: Not on file  . Food insecurity - inability: Not on file  . Transportation needs - medical: Not on  file  . Transportation needs - non-medical: Not on file  Occupational History  . Not on file  Tobacco Use  . Smoking status: Never Smoker  . Smokeless tobacco: Never Used  . Tobacco comment: rarely smoked when he did smoke   Substance and Sexual Activity  . Alcohol use: No    Alcohol/week: 0.0 oz  . Drug use: No  . Sexual activity: Not Currently  Other Topics Concern  . Not on file  Social History Narrative  . Not on file         Current Outpatient Medications  Medication Sig Dispense Refill  . amLODipine (NORVASC) 10 MG tablet Take 1 tablet (10 mg total) by mouth at bedtime. 30 tablet 6  . aspirin EC 81 MG tablet Take 81 mg by mouth at bedtime.     . clopidogrel (PLAVIX) 75 MG tablet Take 1 tablet (75 mg total) by mouth daily. (Patient taking differently: Take 75 mg by mouth at bedtime. ) 90 tablet 3  . fish oil-omega-3 fatty acids 1000 MG capsule Take 1 g by mouth 2 (two) times daily.     . isosorbide mononitrate (IMDUR) 30 MG 24 hr tablet Take 30 mg by mouth 3 (three) times daily.    . metoprolol tartrate (LOPRESSOR) 25 MG tablet Take 25 mg by mouth 2 (two) times daily.    . niacin (NIASPAN) 500 MG CR tablet Take 500 mg by mouth at bedtime.    . nitroGLYCERIN (NITROSTAT) 0.4 MG SL tablet Place 1 tablet (0.4 mg total) under the tongue every 5 (five) minutes x 3 doses as needed. 25 tablet 3  . rosuvastatin (CRESTOR) 40 MG tablet Take 13.3333 mg by mouth at bedtime. Take 1/3 tablet by mouth daily.      No current facility-administered medications for this visit.             Facility-Administered Medications Ordered in Other Visits  Medication Dose Route Frequency Provider Last Rate Last Dose  . sodium chloride 0.9 % injection 3 mL 3 mL Intravenous PRN [name], Nadean Corwin, MD          Allergies  Allergen Reactions  . Atorvastatin Other (See Comments)    Muscle and joint pain, can tolerate rosuvastatin   Review of Systems:  General: normal appetite, normal energy, no weight  gain, no weight loss, no fever  Cardiac: has chest pain with exertion, no chest pain at rest, has SOB with moderate exertion, no resting SOB, no PND, no orthopnea, no palpitations, no arrhythmia, no atrial fibrillation, no LE edema, no dizzy spells, no syncope  Respiratory: exertional shortness of breath, no home oxygen, no productive cough, no dry cough, no bronchitis, no wheezing, no hemoptysis, no asthma, no pain with inspiration or cough, no sleep apnea, no CPAP at night  GI: no difficulty swallowing, no reflux, no frequent heartburn, no hiatal hernia, no abdominal  pain, no constipation, no diarrhea, no hematochezia, no hematemesis, no melena  GU: no dysuria, no frequency, no urinary tract infection, no hematuria, no enlarged prostate, no kidney stones, no kidney disease  Vascular: no pain suggestive of claudication, no pain in feet, no leg cramps, no varicose veins, no DVT, no non-healing foot ulcer  Neuro: remote stroke, no TIA's, no seizures, no headaches, no temporary blindness one eye, no slurred speech, no peripheral neuropathy, no chronic pain, no instability of gait, no memory/cognitive dysfunction  Musculoskeletal: no arthritis, no joint swelling, no myalgias, no difficulty walking, normal mobility  Skin: no rash, no itching, no skin infections, no pressure sores or ulcerations  Psych: no anxiety, no depression, no nervousness, no unusual recent stress  Eyes: no blurry vision, no floaters, no recent vision changes, wears glasses or contacts  ENT: no hearing loss, no loose or painful teeth, no dentures, last saw dentist recently  Hematologic: no easy bruising, no abnormal bleeding, no clotting disorder, no frequent epistaxis  Endocrine: no diabetes, does not check CBG's at home  Physical Exam:  BP 115/62 (BP Location: Right Arm, Patient Position: Sitting, Cuff Size: Large)  Pulse (!) 58  Resp 18  Ht 5' 11.5" (1.816 m)  Wt 188 lb 6.4 oz (85.5 kg)  SpO2 98% Comment: on RA  BMI 25.91  kg/m  General: Elderly but well-appearing  HEENT: Unremarkable, NCAT, PERLA, EOMI, oropharynx clear, teeth in fair condition.  Neck: no JVD, no bruits, no adenopathy or thyromegaly  Chest: clear to auscultation, symmetrical breath sounds, no wheezes, no rhonchi  CV: RRR, grade III/VI crescendo/decrescendo murmur heard best at RSB, no diastolic murmur  Abdomen: soft, non-tender, no masses or organomegaly  Extremities: warm, well-perfused, pulses palpable, no LE edema  Rectal/GU Deferred  Neuro: Grossly non-focal and symmetrical throughout  Skin: Clean and dry, no rashes, no breakdown  Diagnostic Tests:  [facility]  [address],  [address]  [phone]  -------------------------------------------------------------------  Transthoracic Echocardiography  Patient: Stuart, Mirabile  MR #: [id number]  Study Date: 05/07/2017  Gender: M  Age: 46  Height: 176.5 cm  Weight: 80.6 kg  BSA: 2 m^2  Pt. Status:  Room:  ATTENDING Lyman Bishop MD  [place]ne[place] MD  Craig MD  SONOGRAPHER Marygrace Drought, RCS  PERFORMING [facility], Outpatient  cc:  -------------------------------------------------------------------  LV EF: 55% - 60%  -------------------------------------------------------------------  Indications: Aortic Valve Disease (I35.0).  -------------------------------------------------------------------  History: PMH: CKD, CVA. Chest pain. Dyspnea. Risk factors:  Hypertension.  -------------------------------------------------------------------  Study Conclusions  - Left ventricle: The cavity size was normal. Wall thickness was  increased in a pattern of moderate LVH. Systolic function was  normal. The estimated ejection fraction was in the range of 55%  to 60%. Wall motion was normal; there were no regional wall  motion abnormalities. Doppler parameters are consistent with  abnormal left ventricular relaxation (grade 1 diastolic    dysfunction).  - Aortic valve: Trileaflet; severely calcified leaflets. There was  moderate stenosis. There was mild regurgitation. Mean gradient  (S): 31 mm Hg. Valve area (VTI): 1.13 cm^2.  - Mitral valve: Mildly calcified annulus. There was mild  regurgitation.  - Left atrium: The atrium was mildly dilated.  - Right ventricle: The cavity size was normal. Systolic function  was normal.  - Right atrium: The atrium was mildly dilated.  - Tricuspid valve: Peak RV-RA gradient (S): 28 mm Hg.  - Pulmonary arteries: PA peak pressure: 31 mm Hg (  S).  - Inferior vena cava: The vessel was normal in size. The  respirophasic diameter changes were in the normal range (= 50%),  consistent with normal central venous pressure.  Impressions:  - Normal LV size with moderate LV hypertrophy, EF 55-60%. Normal RV  size and systolic function. Moderate aortic stenosis with mild  aortic insufficiency. Mild mitral regurgitation.  -------------------------------------------------------------------  Study data: Comparison was made to the study of 04/25/2016. Study  status: Routine. Procedure: The patient reported no pain pre or  post test. Transthoracic echocardiography. Image quality was  adequate. Transthoracic echocardiography. M-mode,  complete 2D, spectral Doppler, and color Doppler. Birthdate:  Patient birthdate: 1933-10-14. Age: Patient is 84 yr old. Sex:  Gender: male. BMI: 25.9 kg/m^2. Blood pressure: 124/68  Patient status: Outpatient. Study date: Study date: 05/07/2017.  Study time: 09:28 AM. Location: [facility]  -------------------------------------------------------------------  -------------------------------------------------------------------  Left ventricle: The cavity size was normal. Wall thickness was  increased in a pattern of moderate LVH. Systolic function was  normal. The estimated ejection fraction was in the range of 55% to  60%. Wall motion was normal; there were no  regional wall motion  abnormalities. Doppler parameters are consistent with abnormal left  ventricular relaxation (grade 1 diastolic dysfunction).  -------------------------------------------------------------------  Aortic valve: Trileaflet; severely calcified leaflets. Doppler:  There was moderate stenosis. There was mild regurgitation.  VTI ratio of LVOT to aortic valve: 0.32. Valve area (VTI): 1.13  cm^2. Indexed valve area (VTI): 0.57 cm^2/m^2. Peak velocity ratio  of LVOT to aortic valve: 0.29. Valve area (Vmax): 1.04 cm^2.  Indexed valve area (Vmax): 0.52 cm^2/m^2. Mean velocity ratio of  LVOT to aortic valve: 0.33. Valve area (Vmean): 1.17 cm^2. Indexed  valve area (Vmean): 0.59 cm^2/m^2. Mean gradient (S): 31 mm Hg.  Peak gradient (S): 56 mm Hg.  -------------------------------------------------------------------  Aorta: Aortic root: The aortic root was normal in size.  Ascending aorta: The ascending aorta was normal in size.  -------------------------------------------------------------------  Mitral valve: Mildly calcified annulus. Doppler: There was no  evidence for stenosis. There was mild regurgitation. Valve  area by pressure half-time: 2.75 cm^2. Indexed valve area by  pressure half-time: 1.38 cm^2/m^2. Peak gradient (D): 3 mm Hg.  -------------------------------------------------------------------  Left atrium: The atrium was mildly dilated.  -------------------------------------------------------------------  Right ventricle: The cavity size was normal. Systolic function was  normal.  -------------------------------------------------------------------  Pulmonic valve: Structurally normal valve. Cusp separation was  normal. Doppler: Transvalvular velocity was within the normal  range. There was no regurgitation.  -------------------------------------------------------------------  Tricuspid valve: Doppler: There was mild regurgitation.    -------------------------------------------------------------------  Right atrium: The atrium was mildly dilated.  -------------------------------------------------------------------  Pericardium: There was no pericardial effusion.  -------------------------------------------------------------------  Systemic veins:  Inferior vena cava: The vessel was normal in size. The  respirophasic diameter changes were in the normal range (= 50%),  consistent with normal central venous pressure. Diameter: 17 mm.  -------------------------------------------------------------------  Measurements  IVC Value Reference  ID 17 mm ----------  Left ventricle Value Reference  LV ID, ED, PLAX chordal (L) 42 mm 43 - 52  LV ID, ES, PLAX chordal 31 mm 23 - 38  LV fx shortening, PLAX chordal (L) 26 % >=29  LV PW thickness, ED 9 mm ----------  IVS/LV PW ratio, ED (H) 1.78 <=1.3  Stroke volume, 2D 93 ml ----------  Stroke volume/bsa, 2D 47 ml/m^2 ----------  LV e&', lateral 6.96 cm/s ----------  LV E/e&', lateral 11.78 ----------  LV e&', medial 4.9 cm/s ----------  LV E/e&', medial 16.73 ----------  LV e&', average 5.93 cm/s ----------  LV E/e&', average 13.83 ----------  Ventricular septum Value Reference  IVS thickness, ED 16 mm ----------  LVOT Value Reference  LVOT ID, S 21.3 mm ----------  LVOT area 3.56 cm^2 ----------  LVOT peak velocity, S 109 cm/s ----------  LVOT mean velocity, S 80.9 cm/s ----------  LVOT VTI, S 29.5 cm ----------  Stroke volume (SV), LVOT DP 105.1 ml ----------  Stroke index (SV/bsa), LVOT DP 52.6 ml/m^2 ----------  Aortic valve Value Reference  Aortic valve peak velocity, S 375 cm/s ----------  Aortic valve mean velocity, S 246 cm/s ----------  Aortic valve VTI, S 92.7 cm ----------  Aortic mean gradient, S 30 mm Hg ----------  Aortic peak gradient, S 56 mm Hg ----------  VTI ratio, LVOT/AV 0.32 ----------  Aortic valve area, VTI 1.13 cm^2 ----------  Aortic valve  area/bsa, VTI 0.57 cm^2/m^2 ----------  Velocity ratio, peak, LVOT/AV 0.29 ----------  Aortic valve area, peak velocity 1.04 cm^2 ----------  Aortic valve area/bsa, peak 0.52 cm^2/m^2 ----------  velocity  Velocity ratio, mean, LVOT/AV 0.33 ----------  Aortic valve area, mean velocity 1.17 cm^2 ----------  Aortic valve area/bsa, mean 0.59 cm^2/m^2 ----------  velocity  Aorta Value Reference  Aortic root ID, ED 30 mm ----------  Left atrium Value Reference  LA ID, A-P, ES 49 mm ----------  LA ID/bsa, A-P (H) 2.45 cm/m^2 <=2.2  LA volume, S 68.8 ml ----------  LA volume/bsa, S 34.4 ml/m^2 ----------  LA volume, ES, 1-p A4C 67.1 ml ----------  LA volume/bsa, ES, 1-p A4C 33.6 ml/m^2 ----------  LA volume, ES, 1-p A2C 66.6 ml ----------  LA volume/bsa, ES, 1-p A2C 33.3 ml/m^2 ----------  Mitral valve Value Reference  Mitral E-wave peak velocity 82 cm/s ----------  Mitral A-wave peak velocity 95.6 cm/s ----------  Mitral deceleration time (H) 275 ms 150 - 230  Mitral pressure half-time 80 ms ----------  Mitral peak gradient, D 3 mm Hg ----------  Mitral E/A ratio, peak 0.9 ----------  Mitral valve area, PHT, DP 2.75 cm^2 ----------  Mitral valve area/bsa, PHT, DP 1.38 cm^2/m^2 ----------  Pulmonary arteries Value Reference  PA pressure, S, DP (H) 31 mm Hg <=30  Tricuspid valve Value Reference  Tricuspid regurg peak velocity 263 cm/s ----------  Tricuspid peak RV-RA gradient 28 mm Hg ----------  Tricuspid maximal regurg 263 cm/s ----------  velocity, PISA  Right atrium Value Reference  RA ID, S-I, ES, A4C (H) 61.2 mm 34 - 49  RA area, ES, A4C (H) 21.2 cm^2 8.3 - 19.5  RA volume, ES, A/L 61 ml ----------  RA volume/bsa, ES, A/L 30.5 ml/m^2 ----------  Systemic veins Value Reference  Estimated CVP 3 mm Hg ----------  Right ventricle Value Reference  TAPSE 18.4 mm ----------  RV s&', lateral, S 12.4 cm/s ----------  Legend:  (L) and (H) mark values outside specified reference  range.  -------------------------------------------------------------------  Prepared and Electronically Authenticated by  Loralie Champagne, M.D.  2018-09-20T14:52:18  Physicians  Panel Physicians Referring Physician Case Authorizing Physician  Belva Crome, MD (Primary)    Procedures  RIGHT/LEFT HEART CATH AND CORONARY/GRAFT ANGIOGRAPHY  Conclusion  Severe native vessel coronary artery disease with total occlusion of the LAD and circumflex arteries. 75% stenosis in the distal left main ostial LAD within the previously placed stent. Angiographic left main diameter is less than 2.5 mm.  Native right coronary is stented from the ostium to the proximal segment. There appear to be  overlapping stents. There is overhang of the ostial stent into the right sinus of Valsalva preventing selective engagement. The right coronary contains mid vessel eccentric 50-70% stenosis, distal 70% stenosis and 85% obstruction in the PL of the right coronary.  Widely patent bypass grafts including the saphenous vein graft to the first obtuse marginal, saphenous vein graft to the distal obtuse marginal, and the left internal mammary to the LAD.  Moderately severe aortic stenosis with a mean gradient of 30 mmHg, and a calculated aortic valve area of 0.95 cm square.  Normal right heart pressures.  Upper normal LV EDP. RECOMMENDATIONS:  Continue medical therapy including longitudinal assessment of aortic stenosis.  Current angina is likely from the right coronary but unable to perform intervention due to previously stented ostium containing a stent that overhangs into the sinus of Valsalva. This prevents selective engagement.  Indications  Coronary artery disease of bypass graft of native heart with stable angina pectoris (HCC) [I25.708 (ICD-10-CM)]  Nonrheumatic aortic valve stenosis [I35.0 (ICD-10-CM)]  Procedural Details/Technique  Technical Details The right femoral was sterilely prepped and draped. 1% Xylocaine local  infiltration was then given for local analgesia. Sedation was administered in the form of intravenous Versed and fentanyl.. Using real-time vascular ultrasound, a Seldinger needle was then used to perform an anterior wall common femoral artery stick. A VUS image was saved for the permanent record. A 5 French sheath was inserted using the modified Seldinger technique. Coronary angiography was then performed using a 5 French B2 multipurpose, internal mammary catheter, and JL4. Left ventriculography was not performed. Hemodynamics were recorded after the internal mammary catheter was used to cross the stenotic aortic valve. A 0.035 straight wire was used within the IMA catheter for ventricular access. The left internal mammary graft was selectively engaged and visualized using the IMA catheter. The native right coronary could not be selectively engaged due to stent overhang into the right coronary sinus of Valsalva. An aortic O2 saturation was obtained with the B2 multipurpose catheter.  Right heart catheterization was performed from the right femoral. 1% Xylocaine was used to locally anesthetize the region. Real-time vascular ultrasound was used to localize the right femoral vein. A 7 French sheath was inserted using a modified Seldinger technique. Right heart pressures were recorded using a Swan-Ganz catheter. A pulmonary artery O2 saturation was obtained from this catheter.   During this procedure the patient is administered a total of Versed 1 mg and Fentanyl 50 mg to achieve and maintain moderate conscious sedation. The patient's heart rate, blood pressure, and oxygen saturation are monitored continuously during the procedure. The period of conscious sedation is 56 minutes, of which I was present face-to-face 100% of this time.   Estimated blood loss <50 mL.  During this procedure the patient was administered the following to achieve and maintain moderate conscious sedation: Versed 1 mg, Fentanyl 50 mcg,  while the patient's heart rate, blood pressure, and oxygen saturation were continuously monitored. The period of conscious sedation was 56 minutes, of which I was present face-to-face 100% of this time.  Coronary Findings  Diagnostic  Dominance: Right  Left Main  Dist LM to Ost LAD lesion 85% stenosed  Dist LM to Ost LAD lesion is 85% stenosed.  Left Anterior Descending  Ost LAD to Prox LAD lesion 50% stenosed  Ost LAD to Prox LAD lesion is 50% stenosed. The lesion was previously treated.  Prox LAD lesion 100% stenosed  Prox LAD lesion is 100% stenosed.  Left Circumflex  Colon Flattery  Cx to Prox Cx lesion 100% stenosed  Ost Cx to Prox Cx lesion is 100% stenosed. The lesion was previously treated.  Prox Cx to Mid Cx lesion 100% stenosed  Prox Cx to Mid Cx lesion is 100% stenosed.  First Obtuse Marginal Branch  Ost 1st Mrg lesion 100% stenosed  Ost 1st Mrg lesion is 100% stenosed. The lesion was previously treated.  Right Coronary Artery  Ost RCA to Mid RCA lesion 40% stenosed  Ost RCA to Mid RCA lesion is 40% stenosed. The lesion was previously treated.  Mid RCA lesion 60% stenosed  Mid RCA lesion is 60% stenosed.  Dist RCA lesion 70% stenosed  Dist RCA lesion is 70% stenosed.  Right Posterior Atrioventricular Branch  Post Atrio lesion 85% stenosed  Post Atrio lesion is 85% stenosed.  LIMA Graft to Mid LAD  Graft to 1st Mrg  Graft to 3rd Mrg  Intervention  No interventions have been documented.  Right Heart  Right Heart Pressures Hemodynamic findings consistent with pulmonary hypertension. LV EDP is normal.  Left Heart  Aortic Valve There is moderate aortic valve stenosis. The aortic valve is calcified. There is restricted aortic valve motion.  Coronary Diagrams  Diagnostic Diagram     Implants     No implant documentation for this case.  MERGE Images  Link to Procedure Log   Show images for CARDIAC CATHETERIZATION Procedure Log  Hemo Data   Most Recent Value  Fick Cardiac  Output 4.8 L/min  Fick Cardiac Output Index 2.42 (L/min)/BSA  Aortic Mean Gradient 30.3 mmHg  Aortic Peak Gradient 30 mmHg  Aortic Valve Area 0.95  Aortic Value Area Index 0.48 cm2/BSA  RA A Wave 6 mmHg  RA V Wave 6 mmHg  RA Mean 3 mmHg  RV Systolic Pressure 32 mmHg  RV Diastolic Pressure -5 mmHg  RV EDP 7 mmHg  PA Systolic Pressure 27 mmHg  PA Diastolic Pressure 8 mmHg  PA Mean 16 mmHg  PW A Wave 15 mmHg  PW V Wave 18 mmHg  PW Mean 11 mmHg  AO Systolic Pressure 782 mmHg  AO Diastolic Pressure 61 mmHg  AO Mean 89 mmHg  LV Systolic Pressure 423 mmHg  LV Diastolic Pressure 3 mmHg  LV EDP 18 mmHg  Arterial Occlusion Pressure Extended Systolic Pressure 536 mmHg  Arterial Occlusion Pressure Extended Diastolic Pressure 61 mmHg  Arterial Occlusion Pressure Extended Mean Pressure 90 mmHg  Left Ventricular Apex Extended Systolic Pressure 144 mmHg  Left Ventricular Apex Extended Diastolic Pressure 3 mmHg  Left Ventricular Apex Extended EDP Pressure 17 mmHg  QP/QS 1  TPVR Index 6.6 HRUI  TSVR Index 36.69 HRUI  PVR SVR Ratio 0.06  TPVR/TSVR Ratio 0.18   ADDENDUM REPORT: 08/28/2017 13:51  CLINICAL DATA:  Aortic stenosis  EXAM: Cardiac TAVR CT  TECHNIQUE: The patient was scanned on a Siemens 315 slice scanner. A 120 kV retrospective scan was triggered in the ascending thoracic aorta at 140 HU's. Gantry rotation speed was 250 msecs and collimation was .9 mm. No beta blockade or nitro were given. The 3D data set was reconstructed in 5% intervals of the R-R cycle. Systolic and diastolic phases were analyzed on a dedicated work station using MPR, MIP and VRT modes. The patient received 80 cc of contrast.  FINDINGS: Aortic Valve: Calcified and tri leaflet with restricted motion  Aorta: Normal diameter and origin of arch vessels mild calcific atherosclerotic debris  Sinotubular Junction: 29 mm  Ascending Thoracic Aorta: 33 mm  Aortic  Arch: 32 mm  Descending  Thoracic Aorta: 28 mm  Sinus of Valsalva Measurements:  Non-coronary: 32.8 mm  Right -coronary: 35 mm  Left -coronary: 33 mm  Coronary Artery Height above Annulus:  Left Main: 13.5 mm above annulus  Right Coronary: 18.9 mm above annulus  Virtual Basal Annulus Measurements:  Maximum/Minimum Diameter: 21.8 x 26.4 mm  Perimeter: 76.7 mm  Area: 439.5 mm2  Coronary Arteries: Sufficient height above annulus for deployment  Optimum Fluoroscopic Angle for Delivery: LAO 14 degrees Caudal 14 degrees  IMPRESSION: 1) Calcified tri leaflet aortic valve with annular area 439.5 mm2 suitable for 26 mm Sapien 3 valve and perimeter 76.7 mm suitable for a 29 mm Evolut pro valve  2) Coronary arteries sufficient height above annulus for deployment  3. Optimum angiographic angle for deployment LAO 14 degrees Caudal 14 degrees  Jenkins Rouge   Electronically Signed   By: Jenkins Rouge M.D.   On: 08/28/2017 13:51   Addended by Josue Hector, MD on 08/28/2017 1:53 PM    Study Result   EXAM: OVER-READ INTERPRETATION  CT CHEST  The following report is an over-read performed by radiologist Dr. Salvatore Marvel of [facility], [place] on 08/28/2017. This over-read does not include interpretation of cardiac or coronary anatomy or pathology. The coronary CTA interpretation by the cardiologist is attached.  COMPARISON:  07/21/2017 chest radiograph.  FINDINGS: Please see the separate dedicated report for the concurrent CT angiogram of the chest, abdomen and pelvis for details regarding extracardiac findings in the chest.  IMPRESSION: Please see the separate dedicated report for the concurrent CT angiogram of the chest, abdomen and pelvis for details regarding extracardiac findings in the chest.  Electronically Signed: By: Ilona Sorrel M.D. On: 08/28/2017 13:21      CLINICAL DATA:  84 year old male with severe symptomatic aortic stenosis. Pre-TAVR  evaluation.  EXAM: CT ANGIOGRAPHY CHEST, ABDOMEN AND PELVIS  TECHNIQUE: Multidetector CT imaging through the chest, abdomen and pelvis was performed using the standard protocol during bolus administration of intravenous contrast. Multiplanar reconstructed images and MIPs were obtained and reviewed to evaluate the vascular anatomy.  CONTRAST:  95 cc Isovue 370 IV.  COMPARISON:  11/27/2015 renal sonogram.  02/22/2007 chest CT.  FINDINGS: CTA CHEST FINDINGS  Cardiovascular: Mild cardiomegaly. No significant pericardial fluid/thickening. Severe thickening and coarse calcification of the aortic valve. Left main and 3 vessel coronary atherosclerosis status post CABG. Atherosclerotic nonaneurysmal thoracic aorta. No evidence of acute intramural hematoma, dissection, pseudoaneurysm or penetrating atherosclerotic ulcer in the thoracic aorta. Thoracic aortic branch vessels are patent. Normal caliber pulmonary arteries. No central pulmonary emboli.  Mediastinum/Nodes: No discrete thyroid nodules. Unremarkable esophagus. No axillary adenopathy. Mild right paratracheal adenopathy measuring up to 1.1 cm (series 14/image 40), new. Mildly enlarged 1.0 cm subcarinal node (series 14/image 46), new. No hilar adenopathy.  Lungs/Pleura: No pneumothorax. No pleural effusion. No acute consolidative airspace disease, lung masses or significant pulmonary nodules. Mild hypoventilatory changes in the dependent lower lobes.  Musculoskeletal: No aggressive appearing focal osseous lesions. Intact sternotomy wires. Mild to moderate thoracic spondylosis.  CTA ABDOMEN AND PELVIS FINDINGS  Hepatobiliary: Normal liver size. No liver masses. Normal gallbladder with no radiopaque cholelithiasis. No biliary ductal dilatation.  Pancreas: Normal, with no mass or duct dilation.  Spleen: Normal size spleen. No splenic mass. 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  Adrenals/Urinary Tract: Normal adrenals.  No hydronephrosis. A few scattered subcentimeter hypodense renal cortical lesions in both kidneys are too small to characterize and require no follow-up. Normal bladder.  Stomach/Bowel: Normal non-distended stomach. Normal caliber small bowel with no small bowel wall thickening. Normal appendix. Mild sigmoid diverticulosis, with no large bowel wall thickening or pericolonic fat stranding.  Vascular/Lymphatic: Atherosclerotic nonaneurysmal abdominal aorta. Patent splenic and renal veins. No pathologically enlarged lymph nodes in the abdomen or pelvis.  Reproductive: Mildly enlarged prostate with nonspecific internal prostatic calcifications.  Other: No pneumoperitoneum, ascites or focal fluid collection.  Musculoskeletal: No aggressive appearing focal osseous lesions. Moderate lumbar spondylosis.  VASCULAR MEASUREMENTS PERTINENT TO TAVR:  AORTA:  Minimal Aortic Diameter-13.7 x 11.4 mm (infrarenal abdominal aorta)  Severity of Aortic Calcification-moderate to severe  RIGHT PELVIS:  Right Common Iliac Artery -  Minimal Diameter-9.1 x 7.5 mm  Tortuosity-mild  Calcification-moderate  Right External Iliac Artery -  Minimal Diameter-8.0 x 6.7 mm  Tortuosity-mild  Calcification-mild  Right Common Femoral Artery -  Minimal Diameter-8.1 x 7.4 mm  Tortuosity-mild  Calcification-mild-to-moderate  LEFT PELVIS:  Left Common Iliac Artery -  Minimal Diameter-8.6 x 6.9 mm  Tortuosity-mild  Calcification-moderate to severe  Left External Iliac Artery -  Minimal Diameter-8.7 x 8.4 mm  Tortuosity-mild  Calcification-none  Left Common Femoral Artery -  Minimal Diameter-9.2 x 8.0 mm  Tortuosity-mild  Calcification-mild  Review of the MIP images confirms the  above findings.  IMPRESSION: 1. Vascular findings and measurements pertinent to potential TAVR procedure, as detailed above. 2. Severe thickening and calcification of the aortic valve, compatible with the reported clinical history of severe aortic stenosis. 3. Mild cardiomegaly. Left main and 3 vessel coronary atherosclerosis status post CABG. 4. Nonspecific mild mediastinal lymphadenopathy, new since 2008 chest CT. No follow-up is required. This recommendation follows ACR consensus guidelines: Managing Incidental Findings on Thoracic CT: Mediastinal and Cardiovascular Findings. A White Paper of the ACR Incidental Findings Committee. J Am Coll Radiol. 2018; 15: 3716-9678. 5. 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 6. Chronic findings include: Aortic Atherosclerosis (ICD10-I70.0). Mild sigmoid diverticulosis. Mild prostatomegaly.   Electronically Signed   By: Ilona Sorrel M.D.   On: 08/28/2017 14:16  Impression:   Patient has stage D severe symptomatic aortic stenosis and multivessel coronary artery disease status post coronary artery bypass grafting in 1996 and status post PCI and stenting of the right coronary artery in 2013.  He presents with progressive symptoms of exertional shortness of breath and chest tightness consistent with chronic diastolic congestive heart failure and stable angina pectoris, [place] Heart Association functional class II.  I have personally reviewed the patient's most recent transthoracic echocardiogram, diagnostic cardiac catheterization, and CT angiograms, and his echocardiogram and catheterization were both reviewed by the heart valve team.  Echocardiogram demonstrates the presence of severe aortic stenosis.  Peak velocity across the aortic valve measured close to 4 m/s with mean transvalvular gradient estimated greater than 40 mmHg despite the presence of mild left ventricular systolic dysfunction with ejection fraction 55%.  All 3  leaflets of the aortic valve are heavily calcified, thickened, and demonstrate severely restricted leaflet mobility.  Mean transvalvular gradient measured at the time of catheterization was greater than 30 mmHg, corresponding to aortic valve area calculated 0.95 cm.  Catheterization revealed chronic occlusion of both the left anterior descending coronary artery and left circumflex coronary arteries but continued patency without significant disease involving the previous bypass grafts placed to the left anterior descending coronary artery and both the first  and second obtuse marginal branches of the left circumflex coronary artery.  The right coronary artery is diffusely diseased with moderate in-stent restenosis and 85% proximal stenosis involving the relatively small posterolateral branch off of the distal right coronary artery.  I agree the patient would benefit from aortic valve replacement.  Redo coronary artery bypass grafting could be considered to revascularize the right coronary artery, but the terminal branches of the right coronary artery are relatively small and somewhat diffusely diseased.  Under the circumstances risks associated with conventional surgery would be at least moderately elevated.  Cardiac-gated CTA of the heart reveals anatomical characteristics consistent with aortic stenosis suitable for treatment by transcatheter aortic valve replacement without any significant complicating features and CTA of the aorta and iliac vessels demonstrate what appears to be adequate pelvic vascular access to facilitate a transfemoral approach.  The patient has been advised of a variety of complications that might develop including but not limited to risks of death, stroke, paravalvular leak, aortic dissection or other major vascular complications, aortic annulus rupture, device embolization, cardiac rupture or perforation, mitral regurgitation, acute myocardial infarction, arrhythmia, heart block or  bradycardia requiring permanent pacemaker placement, congestive heart failure, respiratory failure, renal failure, pneumonia, infection, other late complications related to structural valve deterioration or migration, or other complications that might ultimately cause a temporary or permanent loss of functional independence or other long term morbidity.  The patient provides full informed consent for the procedure as described and all questions were answered.   Plan:  Transfemoral TAVR   Gaye Pollack, MD

## 2017-09-15 NOTE — Anesthesia Procedure Notes (Signed)
Arterial Line Insertion Start/End1/29/2019 7:00 AM, 09/15/2017 7:05 AM Performed by: CRNA  Patient location: Pre-op. Preanesthetic checklist: patient identified, IV checked, site marked, risks and benefits discussed, surgical consent, monitors and equipment checked, pre-op evaluation, timeout performed and anesthesia consent Lidocaine 1% used for infiltration Left, radial was placed Catheter size: 20 G Hand hygiene performed  and maximum sterile barriers used   Attempts: 1 Procedure performed without using ultrasound guided technique. Following insertion, dressing applied and Biopatch. Patient tolerated the procedure well with no immediate complications.

## 2017-09-15 NOTE — Care Management Note (Addendum)
Case Management Note  Patient Details  Name: [name] MRN: [id number] Date of Birth: 10-18-1933  Subjective/Objective:   From home with wife, pta indep, Post op TAVR. He has PCP and medication coverage. Did not use any DME pta.                   Action/Plan: NCM will follow for dc needs.  Expected Discharge Date:                  Expected Discharge Plan:     In-House Referral:     Discharge planning Services  CM Consult  Post Acute Care Choice:    Choice offered to:     DME Arranged:    DME Agency:     HH Arranged:    HH Agency:     Status of Service:  In process, will continue to follow  If discussed at Long Length of Stay Meetings, dates discussed:    Additional Comments:  Zenon Mayo, RN 09/15/2017, 10:56 AM

## 2017-09-15 NOTE — Anesthesia Postprocedure Evaluation (Signed)
Anesthesia Post Note  Patient: [name]  Procedure(s) Performed: TRANSCATHETER AORTIC VALVE REPLACEMENT, TRANSFEMORAL (N/A Chest) TRANSESOPHAGEAL ECHOCARDIOGRAM (TEE) (N/A Chest)     Patient location during evaluation: SICU Anesthesia Type: MAC Level of consciousness: awake and alert Pain management: pain level controlled Vital Signs Assessment: post-procedure vital signs reviewed and stable Respiratory status: spontaneous breathing, nonlabored ventilation, respiratory function stable and patient connected to nasal cannula oxygen Cardiovascular status: stable and blood pressure returned to baseline Postop Assessment: no apparent nausea or vomiting Anesthetic complications: no    Last Vitals:  Vitals:   09/15/17 1015 09/15/17 1030  BP: 121/60 120/61  Pulse: (!) 47 (!) 57  Resp: 14 17  Temp: (!) 35.4 C   SpO2: 98% 98%    Last Pain:  Vitals:   09/15/17 1015  TempSrc: Rectal                 Janis Sol,W. EDMOND

## 2017-09-15 NOTE — CV Procedure (Signed)
[facility]  TAVR OPERATIVE NOTE   Date of Procedure:  09/15/2017  Preoperative Diagnosis: Severe Aortic Stenosis   Postoperative Diagnosis: Same   Procedure:    Transcatheter Aortic Valve Replacement - Transfemoral Approach  Edwards Sapien 3 THV (size 26 mm, model # [id number], serial # [id number])   Co-Surgeons:  Lauree Chandler, MD and Gaye Pollack, MD   Anesthesiologist:  Ola Spurr  Echocardiographer:  Meda Coffee  Pre-operative Echo Findings:  Severe aortic stenosis  Normal left ventricular systolic function  Post-operative Echo Findings:  No paravalvular leak  Normal left ventricular systolic function  BRIEF CLINICAL NOTE AND INDICATIONS FOR SURGERY  [name] is a pleasant 84 yo male with history of HTN, CAD s/p CABG, carotid artery disease, CVA and CKD with severe AS here today for TAVR. He has had progressive SOB. Aortic stenosis is now severe. Cardiac cath with 3 patent bypass grafts. Patent RCA stent with moderate disease.  CT scans suggest a 26 mm Edwards Sapien 3 valve from the right transfemoral approach.  Creat 1.58.   During the course of the patient's preoperative work up they have been evaluated comprehensively by a multidisciplinary team of specialists coordinated through the [facility] in the [facility].  They have been demonstrated to suffer from symptomatic severe aortic stenosis as noted above. The patient has been counseled extensively as to the relative risks and benefits of all options for the treatment of severe aortic stenosis including long term medical therapy, conventional surgery for aortic valve replacement, and transcatheter aortic valve replacement.  The patient has been independently evaluated by two cardiac surgeons including Dr Roxy Manns and Dr. Cyndia Bent, and they are felt to be at high risk for conventional surgical aortic valve replacement. Both surgeons indicated the patient  would be a poor candidate for conventional surgery. Based upon review of all of the patient's preoperative diagnostic tests they are felt to be candidate for transcatheter aortic valve replacement using the transfemoral approach as an alternative to high risk conventional surgery.    Following the decision to proceed with transcatheter aortic valve replacement, a discussion has been held regarding what types of management strategies would be attempted intraoperatively in the event of life-threatening complications, including whether or not the patient would be considered a candidate for the use of cardiopulmonary bypass and/or conversion to open sternotomy for attempted surgical intervention.  The patient has been advised of a variety of complications that might develop peculiar to this approach including but not limited to risks of death, stroke, paravalvular leak, aortic dissection or other major vascular complications, aortic annulus rupture, device embolization, cardiac rupture or perforation, acute myocardial infarction, arrhythmia, heart block or bradycardia requiring permanent pacemaker placement, congestive heart failure, respiratory failure, renal failure, pneumonia, infection, other late complications related to structural valve deterioration or migration, or other complications that might ultimately cause a temporary or permanent loss of functional independence or other long term morbidity.  The patient provides full informed consent for the procedure as described and all questions were answered preoperatively.    DETAILS OF THE OPERATIVE PROCEDURE  PREPARATION:   The patient is brought to the operating room on the above mentioned date and central monitoring was established by the anesthesia team including placement of a radial arterial line. The patient is placed in the supine position on the operating table.  Intravenous antibiotics are administered. Conscious sedation is used.   Baseline  transthoracic echocardiogram was performed. The patient's chest, abdomen,  both groins, and both lower extremities are prepared and draped in a sterile manner. A time out procedure is performed.   PERIPHERAL ACCESS:   Using the modified Seldinger technique, femoral arterial and venous access were obtained with placement of 6 Fr sheaths on the left side with u/s guidance.  A pigtail diagnostic catheter was passed through the femoral arterial sheath under fluoroscopic guidance into the aortic root.  A temporary transvenous pacemaker catheter was passed through the femoral venous sheath under fluoroscopic guidance into the right ventricle.  The pacemaker was tested to ensure stable lead placement and pacemaker capture. Aortic root angiography was performed in order to determine the optimal angiographic angle for valve deployment.  TRANSFEMORAL ACCESS:  A micropuncture kit was used to gain access to the right femoral artery with u/s guidance. Position confirmed with angiography. Pre-closure with double ProGlide closure devices. The patient was heparinized systemically and ACT verified > 250 seconds.    A 14 Fr transfemoral E-sheath was introduced into the right femoral artery after progressively dilating over an Amplatz superstiff wire. An AL-1 catheter was used to direct a straight-tip exchange length wire across the native aortic valve into the left ventricle. This was exchanged out for a pigtail catheter and position was confirmed in the LV apex. Simultaneous LV and Ao pressures were recorded.  The pigtail catheter was then exchanged for an Amplatz Extra-stiff wire in the LV apex.   TRANSCATHETER HEART VALVE DEPLOYMENT:  An Edwards Sapien 3 THV (size 26 mm) was prepared and crimped per manufacturer's guidelines, and the proper orientation of the valve is confirmed on the [employer] delivery system. The valve was advanced through the introducer sheath using normal technique until in an appropriate  position in the abdominal aorta beyond the sheath tip. The balloon was then retracted and using the fine-tuning wheel was centered on the valve. The valve was then advanced across the aortic arch using appropriate flexion of the catheter. The valve was carefully positioned across the aortic valve annulus. The Commander catheter was retracted using normal technique. Once final position of the valve has been confirmed by angiographic assessment, the valve is deployed while temporarily holding ventilation and during rapid ventricular pacing to maintain systolic blood pressure < 50 mmHg and pulse pressure < 10 mmHg. The balloon inflation is held for >3 seconds after reaching full deployment volume. Once the balloon has fully deflated the balloon is retracted into the ascending aorta and valve function is assessed using TEE. There is felt to be no paravalvular leak and no central aortic insufficiency.  The patient's hemodynamic recovery following valve deployment is good.  The deployment balloon and guidewire are both removed. Echo demostrated acceptable post-procedural gradients, stable mitral valve function, and no AI.   PROCEDURE COMPLETION:  The sheath was then removed and closure devices were completed. Protamine was administered once femoral arterial repair was complete. The temporary pacemaker, pigtail catheters and femoral sheaths were removed with manual pressure used for hemostasis.   The patient tolerated the procedure well and is transported to the surgical intensive care in stable condition. There were no immediate intraoperative complications. All sponge instrument and needle counts are verified correct at completion of the operation.   No blood products were administered during the operation.  The patient received a total of 21.2 mL of intravenous contrast during the procedure.  Lauree Chandler MD 09/15/2017 9:42 AM

## 2017-09-15 NOTE — Transfer of Care (Signed)
Immediate Anesthesia Transfer of Care Note  Patient: [name]  Procedure(s) Performed: TRANSCATHETER AORTIC VALVE REPLACEMENT, TRANSFEMORAL (N/A Chest) TRANSESOPHAGEAL ECHOCARDIOGRAM (TEE) (N/A Chest)  Patient Location: SICU  Anesthesia Type:MAC  Level of Consciousness: awake, alert  and oriented  Airway & Oxygen Therapy: Patient Spontanous Breathing and Patient connected to nasal cannula oxygen  Post-op Assessment: Report given to RN and Post -op Vital signs reviewed and stable  Post vital signs: Reviewed and stable  Last Vitals:  Vitals:   09/15/17 0600  BP: 140/77  Pulse: (!) 52  Resp: 20  Temp: 36.6 C  SpO2: 100%    Last Pain:  Vitals:   09/15/17 0600  TempSrc: Oral      Patients Stated Pain Goal: 3 (34/91/79 1505)  Complications: No apparent anesthesia complications

## 2017-09-15 NOTE — Anesthesia Procedure Notes (Addendum)
Central Venous Catheter Insertion Performed by: Nolon Nations, MD, anesthesiologist Patient location: Pre-op. Preanesthetic checklist: patient identified, IV checked, site marked, risks and benefits discussed, surgical consent, monitors and equipment checked, pre-op evaluation, timeout performed and anesthesia consent Position: Trendelenburg Lidocaine 1% used for infiltration and patient sedated Hand hygiene performed , maximum sterile barriers used  and Seldinger technique used Catheter size: 8 Fr Total catheter length 16. Central line was placed.Double lumen Procedure performed using ultrasound guided technique. Ultrasound Notes:anatomy identified, needle tip was noted to be adjacent to the nerve/plexus identified, no ultrasound evidence of intravascular and/or intraneural injection and image(s) printed for medical record Attempts: 1 Following insertion, dressing applied, line sutured and Biopatch. Post procedure assessment: blood return through all ports, free fluid flow and no air  Patient tolerated the procedure well with no immediate complications.

## 2017-09-16 ENCOUNTER — Encounter (HOSPITAL_COMMUNITY): Payer: Self-pay | Admitting: Physician Assistant

## 2017-09-16 ENCOUNTER — Inpatient Hospital Stay (HOSPITAL_COMMUNITY): Payer: Medicare Other

## 2017-09-16 DIAGNOSIS — Z8673 Personal history of transient ischemic attack (TIA), and cerebral infarction without residual deficits: Secondary | ICD-10-CM

## 2017-09-16 DIAGNOSIS — R001 Bradycardia, unspecified: Secondary | ICD-10-CM

## 2017-09-16 DIAGNOSIS — Z952 Presence of prosthetic heart valve: Secondary | ICD-10-CM

## 2017-09-16 DIAGNOSIS — I5033 Acute on chronic diastolic (congestive) heart failure: Secondary | ICD-10-CM

## 2017-09-16 DIAGNOSIS — I361 Nonrheumatic tricuspid (valve) insufficiency: Secondary | ICD-10-CM

## 2017-09-16 DIAGNOSIS — I350 Nonrheumatic aortic (valve) stenosis: Principal | ICD-10-CM

## 2017-09-16 LAB — ECHOCARDIOGRAM COMPLETE
Height: 69 in
Weight: 2835.2 oz

## 2017-09-16 LAB — BASIC METABOLIC PANEL
Anion gap: 7 (ref 5–15)
BUN: 21 mg/dL — AB (ref 6–20)
CHLORIDE: 107 mmol/L (ref 101–111)
CO2: 24 mmol/L (ref 22–32)
CREATININE: 1.42 mg/dL — AB (ref 0.61–1.24)
Calcium: 9 mg/dL (ref 8.9–10.3)
GFR calc Af Amer: 52 mL/min — ABNORMAL LOW (ref >60)
GFR, EST NON AFRICAN AMERICAN: 45 mL/min — AB (ref >60)
GLUCOSE: 106 mg/dL — AB (ref 65–99)
POTASSIUM: 4.4 mmol/L (ref 3.5–5.1)
SODIUM: 138 mmol/L (ref 135–145)

## 2017-09-16 LAB — CBC
HCT: 35.2 % — ABNORMAL LOW (ref 39.0–52.0)
Hemoglobin: 11.5 g/dL — ABNORMAL LOW (ref 13.0–17.0)
MCH: 29 pg (ref 26.0–34.0)
MCHC: 32.7 g/dL (ref 30.0–36.0)
MCV: 88.9 fL (ref 78.0–100.0)
PLATELETS: 141 10*3/uL — AB (ref 150–400)
RBC: 3.96 MIL/uL — AB (ref 4.22–5.81)
RDW: 14.7 % (ref 11.5–15.5)
WBC: 8.7 10*3/uL (ref 4.0–10.5)

## 2017-09-16 LAB — MAGNESIUM: Magnesium: 2 mg/dL (ref 1.7–2.4)

## 2017-09-16 MED ORDER — AMLODIPINE BESYLATE 10 MG PO TABS
10.0000 mg | ORAL_TABLET | Freq: Every day | ORAL | Status: DC
  Administered 2017-09-16: 10 mg via ORAL
  Filled 2017-09-16 (×2): qty 1

## 2017-09-16 MED ORDER — METOPROLOL TARTRATE 25 MG PO TABS
25.0000 mg | ORAL_TABLET | Freq: Two times a day (BID) | ORAL | Status: DC
  Administered 2017-09-16 – 2017-09-17 (×2): 25 mg via ORAL
  Filled 2017-09-16 (×3): qty 1

## 2017-09-16 MED ORDER — ISOSORBIDE MONONITRATE ER 30 MG PO TB24
30.0000 mg | ORAL_TABLET | Freq: Three times a day (TID) | ORAL | Status: DC
  Administered 2017-09-16 – 2017-09-17 (×4): 30 mg via ORAL
  Filled 2017-09-16 (×4): qty 1

## 2017-09-16 NOTE — Plan of Care (Signed)
Pt is diuresing well w/o assistance of diuretics.

## 2017-09-16 NOTE — Discharge Instructions (Signed)

## 2017-09-16 NOTE — Progress Notes (Signed)
[facility]   MULTIDISCIPLINARY HEART VALVE TEAM  Patient Name: [name] Date of Encounter: 09/16/2017  Primary Cardiologist: Dr. Debara Pickett / Dr. Angelena Form & Dr. Cyndia Bent (TAVR)  Hospital Problem List     Principal Problem:   S/P TAVR (transcatheter aortic valve replacement) Active Problems:   Essential hypertension   CKD (chronic kidney disease) stage 3, GFR 30-59 ml/min (HCC)   LBBB (left bundle branch block)   Carotid stenosis   Mixed hyperlipidemia   Coronary artery disease   Chronic kidney disease   History of CVA (cerebrovascular accident)   Acute on chronic diastolic heart failure (HCC)     Subjective   Feeling well this AM. Has been up walking around with no difficulties. No CP or SOB.  Inpatient Medications    Scheduled Meds: . acetaminophen  1,000 mg Oral Q6H   Or  . acetaminophen (TYLENOL) oral liquid 160 mg/5 mL  1,000 mg Per Tube Q6H  . amLODipine  10 mg Oral QHS  . aspirin EC  81 mg Oral QHS  . clopidogrel  75 mg Oral QHS  . isosorbide mononitrate  30 mg Oral TID  . niacin  500 mg Oral QHS  . omega-3 acid ethyl esters  1 g Oral BID  . pantoprazole  40 mg Oral Daily  . rosuvastatin  20 mg Oral QHS   Continuous Infusions: . sodium chloride Stopped (09/15/17 1827)  . albumin human    . cefUROXime (ZINACEF)  IV Stopped (09/16/17 0121)  . lactated ringers    . phenylephrine (NEO-SYNEPHRINE) Adult infusion     PRN Meds: albumin human, lactated ringers, metoprolol tartrate, midazolam, morphine injection, ondansetron (ZOFRAN) IV, oxyCODONE, traMADol   Vital Signs    Vitals:   09/16/17 0600 09/16/17 0700 09/16/17 0800 09/16/17 0900  BP: (!) 163/71 (!) 161/72 (!) 168/64 (!) 151/68  Pulse: 68 64 (!) 58 60  Resp: 16 19 16 20   Temp:   98.3 F (36.8 C)   TempSrc:   Oral   SpO2: 98% 100% 99% 98%  Weight:      Height:        Intake/Output Summary (Last 24 hours) at 09/16/2017 0943 Last data filed at 09/16/2017 0900 Gross per 24 hour    Intake 3523.33 ml  Output 5050 ml  Net -1526.67 ml   Filed Weights   09/15/17 1600 09/16/17 0500  Weight: 182 lb 1.6 oz (82.6 kg) 177 lb 3.2 oz (80.4 kg)    Physical Exam    GEN: Well nourished, well developed, in no acute distress. Up sitting in chair HEENT: Grossly normal.  Neck: Supple, no JVD, carotid bruits, or masses. Cardiac: RRR, soft flow murmur. No rubs, or gallops. No clubbing, cyanosis, edema.  Radials/DP/PT 2+ and equal bilaterally.  Respiratory:  Respirations regular and unlabored, clear to auscultation bilaterally. GI: Soft, nontender, nondistended, BS + x 4. MS: no deformity or atrophy. Skin: warm and dry, no rash. Groin sites stable. Neuro:  Strength and sensation are intact. Psych: AAOx3.  Normal affect.  Labs    CBC Recent Labs    09/15/17 1014 09/15/17 1017 09/16/17 0400  WBC 7.2  --  8.7  HGB 10.5* 9.9* 11.5*  HCT 31.6* 29.0* 35.2*  MCV 89.0  --  88.9  PLT 134*  --  673*   Basic Metabolic Panel Recent Labs    09/15/17 0925 09/15/17 1017 09/16/17 0400  NA 139 139 138  K 5.0 4.7 4.4  CL 105  --  107  CO2  --   --  24  GLUCOSE 125* 113* 106*  BUN 25*  --  21*  CREATININE 1.30*  --  1.42*  CALCIUM  --   --  9.0  MG  --   --  2.0   Liver Function Tests No results for input(s): AST, ALT, ALKPHOS, BILITOT, PROT, ALBUMIN in the last 72 hours. No results for input(s): LIPASE, AMYLASE in the last 72 hours. Cardiac Enzymes No results for input(s): CKTOTAL, CKMB, CKMBINDEX, TROPONINI in the last 72 hours. BNP Invalid input(s): POCBNP D-Dimer No results for input(s): DDIMER in the last 72 hours. Hemoglobin A1C No results for input(s): HGBA1C in the last 72 hours. Fasting Lipid Panel No results for input(s): CHOL, HDL, LDLCALC, TRIG, CHOLHDL, LDLDIRECT in the last 72 hours. Thyroid Function Tests No results for input(s): TSH, T4TOTAL, T3FREE, THYROIDAB in the last 72 hours.  Invalid input(s): FREET3  Telemetry    Sinus brady with freq  PVCs - Personally Reviewed  ECG    Sinus brady with LBBB and freq PVCs HR 52 - Personally Reviewed  Radiology    Dg Chest Port 1 View  Result Date: 09/15/2017 CLINICAL DATA:  Status post transcatheter aortic valve replacement. EXAM: PORTABLE CHEST 1 VIEW COMPARISON:  Radiographs of September 11, 2017. FINDINGS: Stable cardiomediastinal silhouette. Aortic valve prosthesis is noted. Sternotomy wires are noted. Right internal jugular catheter is noted with tip in expected position of the SVC. No pneumothorax or pleural effusion is noted. Bony thorax is unremarkable. No acute pulmonary disease is noted. IMPRESSION: No acute cardiopulmonary abnormality seen. Electronically Signed   By: Marijo Conception, M.D.   On: 09/15/2017 10:44    Cardiac Studies   TAVR OPERATIVE NOTE   Date of Procedure:                09/15/2017 Procedure:        Transcatheter Aortic Valve Replacement - Percutaneous Right Transfemoral Approach             Edwards Sapien 3 THV (size 26 mm, model # [id number], serial # [id number])              Co-Surgeons:                        Gaye Pollack, MD and Lauree Chandler, MD   Pre-operative Echo Findings: ? Severe aortic stenosis ? Normal left ventricular systolic function  Post-operative Echo Findings: ? No paravalvular leak ? Normal left ventricular systolic function  _________________  Post operative echo 09/16/17   Patient Profile     [name] is a 84 y.o. male with a history of HTN, CAD s/p CABG (1996) and multiple re interventions on the RCA, LBBB, sinus bradycardia, carotid artery disease s/p bilateral CEAs, CKD stage III, previous CVA (2008), and severe AS who presented to [facility] on 09/15/17 for planned TAVR.    Assessment & Plan    Severe AS: s/p successful TAVR with a 29 mm Edwards Sapein THV via the R TF approach on 09/15/17.  Postoperative echo pending today.  Groin sites are stable.  ECG with no high-grade heart block.  Continue aspirin and  Plavix. We will plan to remove central line and transfer to telemetry today.  Likely discharge home tomorrow  HTN: BP has been elevated.  I will resume home amlodipine, Imdur and metoprolol.  Sinus bradycardia: He has a long history of this and is asymptomatic.  Will  resume home beta-blocker and follow closely.  CAD s/p CABG: Pre TAVR heart catheterization showed widely patent bypass grafts.  There was up to 85% stenosis of the RCA that was unable to be restented.  Medical therapy was recommended.  CKD: creatinine has been stable: 1.42 today.  Acute on chronic diastolic CHF: Preoperative labs showed an elevated BNP around 300.  This has been treated with T AVR.  He appears euvolemic today.    Mable Fill, PA-C  09/16/2017, 9:43 AM  Pager [phone]  I have personally seen and examined this patient. I agree with the assessment and plan as outlined above. He is doing well this am. Both groins are without hematoma. Sinus on tele. Baseline LBBB. Will continue ASA and Plavix. Will remove central line and ambulate. Will arrange echo today to assess valve. Transfer to tele. Likely home tomorrow.   Lauree Chandler 09/16/2017 10:02 AM

## 2017-09-16 NOTE — Progress Notes (Signed)
  Echocardiogram 2D Echocardiogram has been performed.  [name] 09/16/2017, 12:39 PM

## 2017-09-16 NOTE — Progress Notes (Signed)
CARDIAC REHAB PHASE I   PRE:  Rate/Rhythm: 72 SR with PVC's  BP:  Supine:  Sitting: 139/86  Standing:    SaO2: 100 RA  MODE:  Ambulation: 1480 ft   POST:  Rate/Rhythm: 85 SR with PVC's   BP:  Supine:   Sitting: 169/64  Standing:    SaO2: 100 RA 1440-1515 Pt tolerated ambulation well without c/o. Steady gait, BP after walk 169/64. Pt to wheelchair after walk transferring to new room. We will follow pt tomorrow.  Rodney Langton RN 09/16/2017 3:12 PM

## 2017-09-17 LAB — BASIC METABOLIC PANEL
ANION GAP: 8 (ref 5–15)
BUN: 17 mg/dL (ref 6–20)
CHLORIDE: 104 mmol/L (ref 101–111)
CO2: 26 mmol/L (ref 22–32)
Calcium: 9.1 mg/dL (ref 8.9–10.3)
Creatinine, Ser: 1.41 mg/dL — ABNORMAL HIGH (ref 0.61–1.24)
GFR calc non Af Amer: 45 mL/min — ABNORMAL LOW (ref >60)
GFR, EST AFRICAN AMERICAN: 52 mL/min — AB (ref >60)
Glucose, Bld: 103 mg/dL — ABNORMAL HIGH (ref 65–99)
POTASSIUM: 4.1 mmol/L (ref 3.5–5.1)
Sodium: 138 mmol/L (ref 135–145)

## 2017-09-17 MED FILL — Heparin Sodium (Porcine) Inj 1000 Unit/ML: INTRAMUSCULAR | Qty: 30 | Status: AC

## 2017-09-17 MED FILL — Potassium Chloride Inj 2 mEq/ML: INTRAVENOUS | Qty: 40 | Status: AC

## 2017-09-17 MED FILL — Magnesium Sulfate Inj 50%: INTRAMUSCULAR | Qty: 10 | Status: AC

## 2017-09-17 MED FILL — Phenylephrine HCl Inj 10 MG/ML: INTRAMUSCULAR | Qty: 2 | Status: AC

## 2017-09-17 MED FILL — Sodium Chloride IV Soln 0.9%: INTRAVENOUS | Qty: 250 | Status: AC

## 2017-09-17 NOTE — Discharge Summary (Signed)
[facility] TEAM   Discharge Summary    Patient ID: [name],  MRN: [id number], DOB/AGE: 26-Jul-1934 84 y.o.  Admit date: 09/15/2017 Discharge date: 09/17/2017  Primary Care Provider: Deland Pretty Primary Cardiologist:  Dr. Debara Pickett / Dr. Angelena Form & Dr. Cyndia Bent (TAVR)   Discharge Diagnoses    Principal Problem:   S/P TAVR (transcatheter aortic valve replacement) Active Problems:   Essential hypertension   CKD (chronic kidney disease) stage 3, GFR 30-59 ml/min (HCC)   Severe aortic stenosis   LBBB (left bundle branch block)   Carotid stenosis   Mixed hyperlipidemia   Coronary artery disease   Chronic kidney disease   History of CVA (cerebrovascular accident)   Acute on chronic diastolic heart failure (HCC)   Sinus bradycardia   Allergies Allergies  Allergen Reactions  . Atorvastatin Other (See Comments)    Muscle and joint pain, can tolerate rosuvastatin     History of Present Illness     [name] is a 84 y.o. male with a history of HTN, CAD s/p CABG (1996) and multiple re interventions on the RCA, LBBB, sinus bradycardia, carotid artery disease s/p bilateral CEAs, CKD stage III, previous CVA (2008), and severe AS who presented to [facility] on 09/15/17 for planned TAVR.   The patient's cardiac history dates back to 1996 when he underwent coronary artery bypass grafting x3 by Dr. Cyndia Bent with grafts placed including left internal mammary artery to the distal left anterior descending coronary artery, saphenous vein graft to the first obtuse marginal branch, and saphenous vein graft to the second obtuse marginal branch.  The patient recovered uneventfully and did very well for a long period of time.  He developed symptoms of exertional angina in 2013 and underwent catheterization that revealed continued patency of all 3 previously placed bypass grafts but development of right coronary artery stenosis.  The right coronary system was not  grafted at the time of the patient's original bypass surgery, and he was subsequently treated with PCI and stenting.  He developed restenosis requiring repeat PCI.  Since then he has done fairly well.  He was noted to have a heart murmur on physical exam and diagnosed with aortic stenosis that has progressed in severity over the last several years during which time the patient has been followed by Dr. Debara Pickett.  Most recent echocardiogram performed May 07, 2017 revealed moderate to severe aortic stenosis with peak velocity across the aortic valve reported 3.8 m/s corresponding to mean transvalvular gradient estimated 30 mmHg.  The DVI was reported 0.32 corresponding to aortic valve area calculated between 1.04 and 1.13 cm.  Left ventricular systolic function remain normal.  Over the last few months patient developed progressive symptoms of exertional shortness of breath and chest tightness.  The patient was seen in follow-up by Dr. Debara Pickett and subsequently underwent diagnostic cardiac catheterization by Dr. Tamala Julian on July 30, 2017.  Findings were notable for severe multivessel coronary artery disease with chronic total occlusion of the left anterior descending coronary artery and left circumflex coronary arteries.  There was continued patency of the left internal mammary artery graft to the distal left anterior descending coronary artery, saphenous vein graft to the first obtuse marginal branch, and saphenous vein graft to the second obtuse marginal branch.  There was diffuse mild in-stent restenosis involving overlapping stents in the proximal right coronary artery with 50-70% eccentric stenosis in the mid right coronary artery and 85% stenosis in the posterolateral  branch of the distal right coronary artery.  Mean transvalvular gradient across the aortic valve was measured 30 mmHg corresponding to aortic valve area calculated 0.95 cm.  There were normal right heart pressures.  He was evaluated by the  multidisciplinary valve team and felt to be a suitable candidate for TAVR, which was set up for 09/15/17.   Hospital Course     Consultants: none  Severe AS: s/p successful TAVR with a 29 mm Edwards Sapein THV via the R TF approach on 09/15/17.  Postoperative echo shows a normally functioning THV with trivial PVL; mean gradient 13 mmHg. Groin sites are stable. ECG with old LBBB and no high-grade heart block.  Continue aspirin and Plavix. Plan for DC home today with TOC visit in 1 week.  HTN: BP better controlled back on home amlodipine, Imdur and metoprolol.  Sinus bradycardia: he has a long history of this and is asymptomatic. Continue current meds  CAD s/p CABG: pre TAVR heart catheterization showed widely patent bypass grafts. There was up to 85% stenosis of the RCA that was unable to be restented.  Medical therapy was recommended.   CKD stage III: creatinine has been stable: ~ 1.4 which is around his baseline.   Acute on chronic diastolic CHF: preoperative labs showed an elevated BNP around 300.  This has been treated with TAVR.  He appears euvolemic today off diuretic therapy.    The patient has had an uncomplicated hospital course and is recovering well. The femoral catheter sites are stable. He has been seen by Dr. Angelena Form today and deemed ready for discharge home. All follow-up appointments have been scheduled. Discharge medications are listed below.  _____________  Discharge Vitals Blood pressure 138/72, pulse 65, temperature 97.8 F (36.6 C), resp. rate 14, height 5\' 9"  (1.753 m), weight 174 lb 4.8 oz (79.1 kg), SpO2 99 %.  Filed Weights   09/15/17 1600 09/16/17 0500 09/17/17 0447  Weight: 182 lb 1.6 oz (82.6 kg) 177 lb 3.2 oz (80.4 kg) 174 lb 4.8 oz (79.1 kg)   VS:  BP 138/72   Pulse 65   Temp 97.8 F (36.6 C)   Resp 14   Ht 5\' 9"  (1.753 m)   Wt 174 lb 4.8 oz (79.1 kg)   SpO2 99%   BMI 25.74 kg/m    GEN: Well nourished, well developed, in no acute distress    HEENT: normal  Neck: no JVD, carotid bruits, or masses Cardiac: RRR; soft flow murmur. No rubs, or gallops,no edema  Respiratory:  clear to auscultation bilaterally, normal work of breathing GI: soft, nontender, nondistended, + BS MS: no deformity or atrophy  Skin: warm and dry, no rash Neuro:  Alert and Oriented x 3, Strength and sensation are intact Psych: euthymic mood, full affect   Labs & Radiologic Studies     CBC Recent Labs    09/15/17 1014 09/15/17 1017 09/16/17 0400  WBC 7.2  --  8.7  HGB 10.5* 9.9* 11.5*  HCT 31.6* 29.0* 35.2*  MCV 89.0  --  88.9  PLT 134*  --  712*   Basic Metabolic Panel Recent Labs    09/16/17 0400 09/17/17 0458  NA 138 138  K 4.4 4.1  CL 107 104  CO2 24 26  GLUCOSE 106* 103*  BUN 21* 17  CREATININE 1.42* 1.41*  CALCIUM 9.0 9.1  MG 2.0  --    Liver Function Tests No results for input(s): AST, ALT, ALKPHOS, BILITOT, PROT, ALBUMIN in the last  72 hours. No results for input(s): LIPASE, AMYLASE in the last 72 hours. Cardiac Enzymes No results for input(s): CKTOTAL, CKMB, CKMBINDEX, TROPONINI in the last 72 hours. BNP Invalid input(s): POCBNP D-Dimer No results for input(s): DDIMER in the last 72 hours. Hemoglobin A1C No results for input(s): HGBA1C in the last 72 hours. Fasting Lipid Panel No results for input(s): CHOL, HDL, LDLCALC, TRIG, CHOLHDL, LDLDIRECT in the last 72 hours. Thyroid Function Tests No results for input(s): TSH, T4TOTAL, T3FREE, THYROIDAB in the last 72 hours.  Invalid input(s): FREET3  Dg Chest 2 View  Result Date: 09/11/2017 CLINICAL DATA:  Aortic stenosis.  Preoperative exam. EXAM: CHEST  2 VIEW COMPARISON:  CT 08/28/2017.  Chest x-ray 07/21/2017. FINDINGS: Prior CABG. Cardiomegaly with normal pulmonary vascularity. No focal infiltrate. No pleural effusion or pneumothorax. Degenerative change thoracic spine. IMPRESSION: 1.  Prior CABG.  Mild cardiomegaly.  No pulmonary venous congestion. 2.  No acute  pulmonary infiltrate. Electronically Signed   By: Marcello Moores  Register   On: 09/11/2017 11:18   Ct Coronary Morph W/cta Cor Nancy Fetter W/ca W/cm &/or Wo/cm  Addendum Date: 08/28/2017   ADDENDUM REPORT: 08/28/2017 13:51 CLINICAL DATA:  Aortic stenosis EXAM: Cardiac TAVR CT TECHNIQUE: The patient was scanned on a Siemens 527 slice scanner. A 120 kV retrospective scan was triggered in the ascending thoracic aorta at 140 HU's. Gantry rotation speed was 250 msecs and collimation was .9 mm. No beta blockade or nitro were given. The 3D data set was reconstructed in 5% intervals of the R-R cycle. Systolic and diastolic phases were analyzed on a dedicated work station using MPR, MIP and VRT modes. The patient received 80 cc of contrast. FINDINGS: Aortic Valve: Calcified and tri leaflet with restricted motion Aorta: Normal diameter and origin of arch vessels mild calcific atherosclerotic debris Sinotubular Junction: 29 mm Ascending Thoracic Aorta: 33 mm Aortic Arch: 32 mm Descending Thoracic Aorta: 28 mm Sinus of Valsalva Measurements: Non-coronary: 32.8 mm Right -coronary: 35 mm Left -coronary: 33 mm Coronary Artery Height above Annulus: Left Main: 13.5 mm above annulus Right Coronary: 18.9 mm above annulus Virtual Basal Annulus Measurements: Maximum/Minimum Diameter: 21.8 x 26.4 mm Perimeter: 76.7 mm Area: 439.5 mm2 Coronary Arteries: Sufficient height above annulus for deployment Optimum Fluoroscopic Angle for Delivery: LAO 14 degrees Caudal 14 degrees IMPRESSION: 1) Calcified tri leaflet aortic valve with annular area 439.5 mm2 suitable for 26 mm Sapien 3 valve and perimeter 76.7 mm suitable for a 29 mm Evolut pro valve 2) Coronary arteries sufficient height above annulus for deployment 3. Optimum angiographic angle for deployment LAO 14 degrees Caudal 14 degrees Jenkins Rouge Electronically Signed   By: Jenkins Rouge M.D.   On: 08/28/2017 13:51   Result Date: 08/28/2017 EXAM: OVER-READ INTERPRETATION  CT CHEST The  following report is an over-read performed by radiologist Dr. Salvatore Marvel of [facility], [place] on 08/28/2017. This over-read does not include interpretation of cardiac or coronary anatomy or pathology. The coronary CTA interpretation by the cardiologist is attached. COMPARISON:  07/21/2017 chest radiograph. FINDINGS: Please see the separate dedicated report for the concurrent CT angiogram of the chest, abdomen and pelvis for details regarding extracardiac findings in the chest. IMPRESSION: Please see the separate dedicated report for the concurrent CT angiogram of the chest, abdomen and pelvis for details regarding extracardiac findings in the chest. Electronically Signed: By: Ilona Sorrel M.D. On: 08/28/2017 13:21   Dg Chest Port 1 View  Result Date: 09/15/2017 CLINICAL DATA:  Status post transcatheter aortic  valve replacement. EXAM: PORTABLE CHEST 1 VIEW COMPARISON:  Radiographs of September 11, 2017. FINDINGS: Stable cardiomediastinal silhouette. Aortic valve prosthesis is noted. Sternotomy wires are noted. Right internal jugular catheter is noted with tip in expected position of the SVC. No pneumothorax or pleural effusion is noted. Bony thorax is unremarkable. No acute pulmonary disease is noted. IMPRESSION: No acute cardiopulmonary abnormality seen. Electronically Signed   By: Marijo Conception, M.D.   On: 09/15/2017 10:44   Ct Angio Chest Aorta W &/or Wo Contrast  Result Date: 08/28/2017 CLINICAL DATA:  84 year old male with severe symptomatic aortic stenosis. Pre-TAVR evaluation. EXAM: CT ANGIOGRAPHY CHEST, ABDOMEN AND PELVIS TECHNIQUE: Multidetector CT imaging through the chest, abdomen and pelvis was performed using the standard protocol during bolus administration of intravenous contrast. Multiplanar reconstructed images and MIPs were obtained and reviewed to evaluate the vascular anatomy. CONTRAST:  95 cc Isovue 370 IV. COMPARISON:  11/27/2015 renal sonogram.  02/22/2007 chest CT. FINDINGS: CTA  CHEST FINDINGS Cardiovascular: Mild cardiomegaly. No significant pericardial fluid/thickening. Severe thickening and coarse calcification of the aortic valve. Left main and 3 vessel coronary atherosclerosis status post CABG. Atherosclerotic nonaneurysmal thoracic aorta. No evidence of acute intramural hematoma, dissection, pseudoaneurysm or penetrating atherosclerotic ulcer in the thoracic aorta. Thoracic aortic branch vessels are patent. Normal caliber pulmonary arteries. No central pulmonary emboli. Mediastinum/Nodes: No discrete thyroid nodules. Unremarkable esophagus. No axillary adenopathy. Mild right paratracheal adenopathy measuring up to 1.1 cm (series 14/image 40), new. Mildly enlarged 1.0 cm subcarinal node (series 14/image 46), new. No hilar adenopathy. Lungs/Pleura: No pneumothorax. No pleural effusion. No acute consolidative airspace disease, lung masses or significant pulmonary nodules. Mild hypoventilatory changes in the dependent lower lobes. Musculoskeletal: No aggressive appearing focal osseous lesions. Intact sternotomy wires. Mild to moderate thoracic spondylosis. CTA ABDOMEN AND PELVIS FINDINGS Hepatobiliary: Normal liver size. No liver masses. Normal gallbladder with no radiopaque cholelithiasis. No biliary ductal dilatation. Pancreas: Normal, with no mass or duct dilation. Spleen: Normal size spleen. No splenic mass. 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 Adrenals/Urinary Tract: Normal adrenals. No hydronephrosis. A few scattered subcentimeter hypodense renal cortical lesions in both kidneys are too small to characterize and require no follow-up. Normal bladder. Stomach/Bowel: Normal non-distended stomach. Normal caliber small bowel with no small bowel wall thickening. Normal appendix. Mild sigmoid diverticulosis, with no large bowel wall  thickening or pericolonic fat stranding. Vascular/Lymphatic: Atherosclerotic nonaneurysmal abdominal aorta. Patent splenic and renal veins. No pathologically enlarged lymph nodes in the abdomen or pelvis. Reproductive: Mildly enlarged prostate with nonspecific internal prostatic calcifications. Other: No pneumoperitoneum, ascites or focal fluid collection. Musculoskeletal: No aggressive appearing focal osseous lesions. Moderate lumbar spondylosis. VASCULAR MEASUREMENTS PERTINENT TO TAVR: AORTA: Minimal Aortic Diameter-13.7 x 11.4 mm (infrarenal abdominal aorta) Severity of Aortic Calcification-moderate to severe RIGHT PELVIS: Right Common Iliac Artery - Minimal Diameter-9.1 x 7.5 mm Tortuosity-mild Calcification-moderate Right External Iliac Artery - Minimal Diameter-8.0 x 6.7 mm Tortuosity-mild Calcification-mild Right Common Femoral Artery - Minimal Diameter-8.1 x 7.4 mm Tortuosity-mild Calcification-mild-to-moderate LEFT PELVIS: Left Common Iliac Artery - Minimal Diameter-8.6 x 6.9 mm Tortuosity-mild Calcification-moderate to severe Left External Iliac Artery - Minimal Diameter-8.7 x 8.4 mm Tortuosity-mild Calcification-none Left Common Femoral Artery - Minimal Diameter-9.2 x 8.0 mm Tortuosity-mild Calcification-mild Review of the MIP images confirms the above findings. IMPRESSION: 1. Vascular findings and measurements pertinent to potential TAVR procedure, as detailed above. 2. Severe thickening and calcification  of the aortic valve, compatible with the reported clinical history of severe aortic stenosis. 3. Mild cardiomegaly. Left main and 3 vessel coronary atherosclerosis status post CABG. 4. Nonspecific mild mediastinal lymphadenopathy, new since 2008 chest CT. No follow-up is required. This recommendation follows ACR consensus guidelines: Managing Incidental Findings on Thoracic CT: Mediastinal and Cardiovascular Findings. A White Paper of the ACR Incidental Findings Committee. J Am Coll Radiol. 2018; 15:  4650-3546. 5. 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 6. Chronic findings include: Aortic Atherosclerosis (ICD10-I70.0). Mild sigmoid diverticulosis. Mild prostatomegaly. Electronically Signed   By: Ilona Sorrel M.D.   On: 08/28/2017 14:16   Ct Angio Abd/pel W/ And/or W/o  Result Date: 08/28/2017 CLINICAL DATA:  84 year old male with severe symptomatic aortic stenosis. Pre-TAVR evaluation. EXAM: CT ANGIOGRAPHY CHEST, ABDOMEN AND PELVIS TECHNIQUE: Multidetector CT imaging through the chest, abdomen and pelvis was performed using the standard protocol during bolus administration of intravenous contrast. Multiplanar reconstructed images and MIPs were obtained and reviewed to evaluate the vascular anatomy. CONTRAST:  95 cc Isovue 370 IV. COMPARISON:  11/27/2015 renal sonogram.  02/22/2007 chest CT. FINDINGS: CTA CHEST FINDINGS Cardiovascular: Mild cardiomegaly. No significant pericardial fluid/thickening. Severe thickening and coarse calcification of the aortic valve. Left main and 3 vessel coronary atherosclerosis status post CABG. Atherosclerotic nonaneurysmal thoracic aorta. No evidence of acute intramural hematoma, dissection, pseudoaneurysm or penetrating atherosclerotic ulcer in the thoracic aorta. Thoracic aortic branch vessels are patent. Normal caliber pulmonary arteries. No central pulmonary emboli. Mediastinum/Nodes: No discrete thyroid nodules. Unremarkable esophagus. No axillary adenopathy. Mild right paratracheal adenopathy measuring up to 1.1 cm (series 14/image 40), new. Mildly enlarged 1.0 cm subcarinal node (series 14/image 46), new. No hilar adenopathy. Lungs/Pleura: No pneumothorax. No pleural effusion. No acute consolidative airspace disease, lung masses or significant pulmonary nodules. Mild hypoventilatory changes in the dependent lower lobes. Musculoskeletal: No aggressive appearing focal osseous lesions. Intact sternotomy wires. Mild to moderate thoracic spondylosis. CTA  ABDOMEN AND PELVIS FINDINGS Hepatobiliary: Normal liver size. No liver masses. Normal gallbladder with no radiopaque cholelithiasis. No biliary ductal dilatation. Pancreas: Normal, with no mass or duct dilation. Spleen: Normal size spleen. No splenic mass. 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 Adrenals/Urinary Tract: Normal adrenals. No hydronephrosis. A few scattered subcentimeter hypodense renal cortical lesions in both kidneys are too small to characterize and require no follow-up. Normal bladder. Stomach/Bowel: Normal non-distended stomach. Normal caliber small bowel with no small bowel wall thickening. Normal appendix. Mild sigmoid diverticulosis, with no large bowel wall thickening or pericolonic fat stranding. Vascular/Lymphatic: Atherosclerotic nonaneurysmal abdominal aorta. Patent splenic and renal veins. No pathologically enlarged lymph nodes in the abdomen or pelvis. Reproductive: Mildly enlarged prostate with nonspecific internal prostatic calcifications. Other: No pneumoperitoneum, ascites or focal fluid collection. Musculoskeletal: No aggressive appearing focal osseous lesions. Moderate lumbar spondylosis. VASCULAR MEASUREMENTS PERTINENT TO TAVR: AORTA: Minimal Aortic Diameter-13.7 x 11.4 mm (infrarenal abdominal aorta) Severity of Aortic Calcification-moderate to severe RIGHT PELVIS: Right Common Iliac Artery - Minimal Diameter-9.1 x 7.5 mm Tortuosity-mild Calcification-moderate Right External Iliac Artery - Minimal Diameter-8.0 x 6.7 mm Tortuosity-mild Calcification-mild Right Common Femoral Artery - Minimal Diameter-8.1 x 7.4 mm Tortuosity-mild Calcification-mild-to-moderate LEFT PELVIS: Left Common Iliac Artery - Minimal Diameter-8.6 x 6.9 mm Tortuosity-mild Calcification-moderate to severe Left External Iliac Artery - Minimal Diameter-8.7 x 8.4 mm  Tortuosity-mild Calcification-none Left Common Femoral Artery - Minimal Diameter-9.2 x 8.0 mm Tortuosity-mild Calcification-mild Review  of the MIP images confirms the above findings. IMPRESSION: 1. Vascular findings and measurements pertinent to potential TAVR procedure, as detailed above. 2. Severe thickening and calcification of the aortic valve, compatible with the reported clinical history of severe aortic stenosis. 3. Mild cardiomegaly. Left main and 3 vessel coronary atherosclerosis status post CABG. 4. Nonspecific mild mediastinal lymphadenopathy, new since 2008 chest CT. No follow-up is required. This recommendation follows ACR consensus guidelines: Managing Incidental Findings on Thoracic CT: Mediastinal and Cardiovascular Findings. A White Paper of the ACR Incidental Findings Committee. J Am Coll Radiol. 2018; 15: 8786-7672. 5. 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 6. Chronic findings include: Aortic Atherosclerosis (ICD10-I70.0). Mild sigmoid diverticulosis. Mild prostatomegaly. Electronically Signed   By: Ilona Sorrel M.D.   On: 08/28/2017 14:16     Diagnostic Studies/Procedures   TAVR OPERATIVE NOTE   Date of Procedure:09/15/2017 Procedure:   Transcatheter Aortic Valve Replacement - PercutaneousRightTransfemoral Approach Edwards Sapien 3 THV (size 23mm, model # 9600TFX, serial # [id number])  Co-Surgeons:Bryan Alveria Apley, MD and Lauree Chandler, MD   Pre-operative Echo Findings: ? Severe aortic stenosis ? Normalleft ventricular systolic function  Post-operative Echo Findings: ? Noparavalvular leak ? Normalleft ventricular systolic function  _________________  Post operative echo 09/16/17 Study Conclusions - Left ventricle: The cavity size was normal. There was severe   focal basal and mild concentric hypertrophy. Systolic function   was normal. The estimated ejection fraction was in  the range of   60% to 65%. Wall motion was normal; there were no regional wall   motion abnormalities. Features are consistent with a pseudonormal   left ventricular filling pattern, with concomitant abnormal   relaxation and increased filling pressure (grade 2 diastolic   dysfunction). Doppler parameters are consistent with high   ventricular filling pressure. - Aortic valve: S/P TAVR with 34mm Edwards Sapien bioprosthesis   that is normally functioning with trivial perivalvular AI Mean   gradient (S): 13 mm Hg. Valve area (VTI): 1.68 cm^2. Valve area   (Vmax): 1.64 cm^2. Valve area (Vmean): 1.9 cm^2. - Mitral valve: Calcified annulus. There was mild regurgitation. - Left atrium: The atrium was mildly dilated.   Disposition   Pt is being discharged home today in good condition.  Follow-up Plans & Appointments    Follow-up Information    Eileen Stanford, PA-C. Go on 09/24/2017.   Specialties:  Cardiology, Radiology Why:  @ 3:30pm.  Contact information: [place] [id number] [phone]          Discharge Instructions    Diet - low sodium heart healthy   Complete by:  As directed    Increase activity slowly   Complete by:  As directed       Discharge Medications     Medication List    TAKE these medications   amLODipine 10 MG tablet Commonly known as:  NORVASC Take 1 tablet (10 mg total) by mouth at bedtime.   aspirin EC 81 MG tablet Take 81 mg by mouth at bedtime.   clobetasol 0.05 % external solution Commonly known as:  TEMOVATE Apply 1 application topically 2 (two) times daily as needed. For itchy/rash scalp   clopidogrel 75 MG tablet Commonly known as:  PLAVIX Take 1 tablet (75 mg total) by mouth daily. What changed:  when to take this   fish oil-omega-3 fatty acids 1000 MG capsule Take 1 g by mouth 2 (two) times daily.   halobetasol 0.05 % ointment Commonly known  as:  ULTRAVATE Apply 1 application topically  daily as needed. For skin rash.   isosorbide mononitrate 30 MG 24 hr tablet Commonly known as:  IMDUR Take 30 mg by mouth 3 (three) times daily.   metoprolol tartrate 50 MG tablet Commonly known as:  LOPRESSOR Take 25 mg by mouth 2 (two) times daily.   niacin 500 MG CR tablet Commonly known as:  NIASPAN Take 500 mg by mouth at bedtime.   nitroGLYCERIN 0.4 MG SL tablet Commonly known as:  NITROSTAT Place 1 tablet (0.4 mg total) under the tongue every 5 (five) minutes x 3 doses as needed.   rosuvastatin 40 MG tablet Commonly known as:  CRESTOR Take 20 mg by mouth at bedtime. Take 1/2  tablet by mouth daily.         Outstanding Labs/Studies   none  Duration of Discharge Encounter   Greater than 30 minutes including physician time.  Signed, Angelena Form PA-C 09/17/2017, 9:31 AM   I have personally seen and examined this patient. I agree with the assessment and plan as outlined above.  He is doing well this am. His bioprosthetic AVR is working well.  Labs reviewed and ok.  Will d/c home today on aSA and Plavix.  Follow up one week in TAVR clinic.   Lauree Chandler 09/17/2017 9:50 AM

## 2017-09-17 NOTE — Progress Notes (Signed)
Removed IV successfully. Reviewed discharge paperwork with patient and patient's wife. No further questions. D/ced home with wife.

## 2017-09-17 NOTE — Progress Notes (Signed)
CARDIAC REHAB PHASE I   PRE:  Rate/Rhythm: 83 SR PVCs  BP:  Supine: 144/80  Sitting:   Standing:    SaO2: 99%RA  MODE:  Ambulation: 800 ft   POST:  Rate/Rhythm: 109 ST PVCs  BP:  Supine:   Sitting: 159/64  Standing:    SaO2: 100%RA 0900-0947 Pt walked 800 ft with steady gait and tolerated well. To bed after walk. Gave ex ed and heart healthy diet. Encouraged pt to watch sodium. Discussed CRP 2 and will refer to [place]. Pt will consider program.   Graylon Good, RN BSN  09/17/2017 9:45 AM

## 2017-09-18 ENCOUNTER — Telehealth: Payer: Self-pay | Admitting: Physician Assistant

## 2017-09-18 NOTE — Telephone Encounter (Signed)
  [place] VALVE TEAM   Patient contacted regarding discharge from [facility] on 09/17/17  Patient understands to follow up with provider Nell Range on 2/7 @ 3:30pm at [place].  Patient understands discharge instructions? yes Patient understands medications and regiment? yes Patient understands to bring all medications to this visit? yes  Angelena Form PA-C  [facility]

## 2017-09-22 ENCOUNTER — Telehealth (HOSPITAL_COMMUNITY): Payer: Self-pay

## 2017-09-22 NOTE — Progress Notes (Signed)
[facility]                                       Cardiology Office Note    Date:  09/24/2017   ID:  DERRIC DEALMEIDA, DOB [birth year], MRN [id number]  PCP:  Deland Pretty, MD  Cardiologist:  Dr. Debara Pickett / Dr. Angelena Form & Dr. Cyndia Bent (TAVR)  CC: [facility] s/p TAVR   History of Present Illness:  [name] is a 84 y.o. male with a history of HTN, CAD s/p CABG (1996) and multiple re interventions on the RCA,LBBB,sinus bradycardia,carotid artery disease s/p bilateral CEAs, CKD stage III, previous CVA (2008), and severe AS s/p TAVR (09/15/17) who presents to clinic for follow up.   The patient's cardiac history dates back to 1996 when he underwent coronary artery bypass grafting x3 by Dr. Cyndia Bent with grafts placed including left internal mammary artery to the distal left anterior descending coronary artery, saphenous vein graft to the first obtuse marginal branch, and saphenous vein graft to the second obtuse marginal branch. The patient recovered uneventfully and did very well for a long period of time. He developed symptoms of exertional angina in 2013 and underwent catheterization that revealed continued patency of all 3 previously placed bypass grafts but development of right coronary artery stenosis. The right coronary system was not grafted at the time of the patient's original bypass surgery, and he was subsequently treated with PCI and stenting. He developed restenosis requiring repeat PCI. Since then he has done fairly well. He was noted to have a heart murmur on physical exam and diagnosed with aortic stenosis that has progressed in severity over the last several years during which time the patient has been followed by Dr. Debara Pickett. Most recent echocardiogram performed May 07, 2017 revealed moderate to severe aortic stenosis with peak velocity across the aortic valve reported 3.8 m/s corresponding to mean transvalvular gradient estimated 30  mmHg. The DVI was reported 0.32 corresponding to aortic valve area calculated between 1.04 and 1.13 cm. Left ventricular systolic function remain normal. Over the last few months patient developed progressive symptoms of exertional shortness of breath and chest tightness. The patient was seen in follow-up by Dr. Debara Pickett and subsequently underwent diagnostic cardiac catheterization by Dr. Tamala Julian on July 30, 2017. Findings were notable for severe multivessel coronary artery disease with chronic total occlusion of the left anterior descending coronary artery and left circumflex coronary arteries. There was continued patency of the left internal mammary artery graft to the distal left anterior descending coronary artery, saphenous vein graft to the first obtuse marginal branch, and saphenous vein graft to the second obtuse marginal branch. There was diffuse mild in-stent restenosis involving overlapping stents in the proximal right coronary artery with 50-70% eccentric stenosis in the mid right coronary artery and 85% stenosis in the posterolateral branch of the distal right coronary artery. Mean transvalvular gradient across the aortic valve was measured 30 mmHg corresponding to aortic valve area calculated 0.95 cm. There were normal right heart pressures.  He underwent successful TAVR with a 29 mm Edwards Sapein THV via the R TF approach on 09/15/17.Postoperative echo showed a normally functioning THV with trivial PVL; mean gradient 13 mmHg.He was discharged on ASA and plavix.   Today he presents to clinic for follow up. No CP or SOB. No LE edema, orthopnea or PND. No dizziness  or syncope. No blood in stool or urine. No palpitations. He has been up walking around and feeling like he is breathing better, but he has been taking it easy.    Past Medical History:  Diagnosis Date  . CAD (coronary artery disease)    a. s/p CABG in 1996 and subsequent PCI  . Carotid artery disease    a. Rt. CEA,  08/04/07, Lt. CEA 04/26/07.  . Chronic kidney disease   . CKD (chronic kidney disease) stage 2, GFR 60-89 ml/min 09/16/2011  . Coronary artery disease   . History of CVA (cerebrovascular accident)    a. 2008  . HTN (hypertension),uncontrolled 09/16/2011  . Hypertension   . LBBB (left bundle branch block)   . Peripheral arterial disease ([place])   . Pulmonary hypertension, mild 09/17/2011  . S/P TAVR (transcatheter aortic valve replacement)    a. 09/15/17: s/p Edwards Sapien 3 THV (size 26 mm, model # 9600TFX, serial # [id number])    Past Surgical History:  Procedure Laterality Date  .  Fluid knee Right Sept. 6, 2014   Fluid removed  . CARDIAC CATHETERIZATION Left 06/10/2012   Ostial RCA 99% stenosis pre-dilation 3x58mm Angiosculpt PTCA balloon, 3.5x61mm Promus Element DES overlapping proximal stent and into the Aorto-ostium, post-dilated with4x51mm [place] Trek balloon - 99% stenosis reduced to 0%  . CARDIAC CATHETERIZATION Left 12/22/2011   Left Main-diffuse tapering 50-60% stenosis; mild-moderate diseaseof the right ventricle branch of the right coronary artery; will plan FFR of ostial RCA  . CARDIAC CATHETERIZATION Bilateral 09/16/2011   Severe 90% ostial RCA disease which is calcified, will likely need PCI to ostial RCA with calcification  . CARDIAC CATHETERIZATION  09/18/2011   Calcified ostial 90-95% proximal RCA stenosis, 3x39mm Emerge balloon predilation, 3x60mm DES PROMUS Element stent was placed, post stent dilation done utilizing a 3.25x90mm noncompliant Quantum Apex balloon, 90-95% stenosis reduced to 0%  . CEA  2008  . CORONARY ANGIOPLASTY    . CORONARY ARTERY BYPASS GRAFT  08/04/2007  . FRACTIONAL FLOW RESERVE WIRE  12/22/2011   Ostial RCA, FFR ratio-0.82, physiologically significant, 3.25x28mm Flextome Cutting balloon deployed at ~3.13mm final estimated diameter  . FRACTIONAL FLOW RESERVE WIRE Right 12/22/2011   Procedure: FRACTIONAL FLOW RESERVE WIRE;  Surgeon: Pixie Casino, MD;  Location:  [facility] CATH LAB;  Service: Cardiovascular;  Laterality: Right;  . LEFT AND RIGHT HEART CATHETERIZATION WITH CORONARY ANGIOGRAM Left 09/16/2011   Procedure: LEFT AND RIGHT HEART CATHETERIZATION WITH CORONARY ANGIOGRAM;  Surgeon: Pixie Casino, MD;  Location: [facility] CATH LAB;  Service: Cardiovascular;  Laterality: Left;  . LEFT HEART CATHETERIZATION WITH CORONARY ANGIOGRAM N/A 06/10/2012   Procedure: LEFT HEART CATHETERIZATION WITH CORONARY ANGIOGRAM;  Surgeon: Pixie Casino, MD;  Location: [facility] CATH LAB;  Service: Cardiovascular;  Laterality: N/A;  . LEFT HEART CATHETERIZATION WITH CORONARY/GRAFT ANGIOGRAM N/A 12/22/2011   Procedure: LEFT HEART CATHETERIZATION WITH Beatrix Fetters;  Surgeon: Pixie Casino, MD;  Location: [facility] CATH LAB;  Service: Cardiovascular;  Laterality: N/A;  . PERCUTANEOUS CORONARY ROTOBLATOR INTERVENTION (PCI-R)  09/18/2011   Procedure: PERCUTANEOUS CORONARY ROTOBLATOR INTERVENTION (PCI-R);  Surgeon: Troy Sine, MD;  Location: [facility] CATH LAB;  Service: Cardiovascular;;  . PERCUTANEOUS CORONARY STENT INTERVENTION (PCI-S) N/A 09/18/2011   Procedure: PERCUTANEOUS CORONARY STENT INTERVENTION (PCI-S);  Surgeon: Troy Sine, MD;  Location: [facility] CATH LAB;  Service: Cardiovascular;  Laterality: N/A;  . RIGHT/LEFT HEART CATH AND CORONARY/GRAFT ANGIOGRAPHY N/A 07/30/2017   Procedure: RIGHT/LEFT HEART CATH AND CORONARY/GRAFT ANGIOGRAPHY;  Surgeon: Tamala Julian,  Lynnell Dike, MD;  Location: [place] CV LAB;  Service: Cardiovascular;  Laterality: N/A;  . TEE WITHOUT CARDIOVERSION N/A 09/15/2017   Procedure: TRANSESOPHAGEAL ECHOCARDIOGRAM (TEE);  Surgeon: Burnell Blanks, MD;  Location: [place];  Service: Open Heart Surgery;  Laterality: N/A;  . TEMPORARY PACEMAKER INSERTION  09/18/2011   Procedure: TEMPORARY PACEMAKER INSERTION;  Surgeon: Troy Sine, MD;  Location: [facility] CATH LAB;  Service: Cardiovascular;;  . TRANSCATHETER AORTIC VALVE REPLACEMENT, TRANSFEMORAL N/A 09/15/2017   Procedure:  TRANSCATHETER AORTIC VALVE REPLACEMENT, TRANSFEMORAL;  Surgeon: Burnell Blanks, MD;  Location: [place];  Service: Open Heart Surgery;  Laterality: N/A;    Current Medications: Outpatient Medications Prior to Visit  Medication Sig Dispense Refill  . amLODipine (NORVASC) 10 MG tablet Take 1 tablet (10 mg total) by mouth at bedtime. 30 tablet 6  . aspirin EC 81 MG tablet Take 81 mg by mouth at bedtime.     . clobetasol (TEMOVATE) 0.05 % external solution Apply 1 application topically 2 (two) times daily as needed. For itchy/rash scalp  3  . clopidogrel (PLAVIX) 75 MG tablet Take 1 tablet (75 mg total) by mouth daily. (Patient taking differently: Take 75 mg by mouth at bedtime. ) 90 tablet 3  . fish oil-omega-3 fatty acids 1000 MG capsule Take 1 g by mouth 2 (two) times daily.     . halobetasol (ULTRAVATE) 0.05 % ointment Apply 1 application topically daily as needed. For skin rash.    . isosorbide mononitrate (IMDUR) 30 MG 24 hr tablet Take 30 mg by mouth 3 (three) times daily.    . metoprolol tartrate (LOPRESSOR) 50 MG tablet Take 25 mg by mouth 2 (two) times daily.    . niacin (NIASPAN) 500 MG CR tablet Take 500 mg by mouth at bedtime.    . nitroGLYCERIN (NITROSTAT) 0.4 MG SL tablet Place 1 tablet (0.4 mg total) under the tongue every 5 (five) minutes x 3 doses as needed. 25 tablet 3  . rosuvastatin (CRESTOR) 40 MG tablet Take 20 mg by mouth at bedtime. Take 1/2  tablet by mouth daily.     Facility-Administered Medications Prior to Visit  Medication Dose Route Frequency Provider Last Rate Last Dose  . sodium chloride 0.9 % injection 3 mL  3 mL Intravenous PRN [name], Nadean Corwin, MD         Allergies:   Atorvastatin   Social History   Socioeconomic History  . Marital status: Married    Spouse name: None  . Number of children: None  . Years of education: None  . Highest education level: None  Social Needs  . Financial resource strain: None  . Food insecurity - worry: None  .  Food insecurity - inability: None  . Transportation needs - medical: None  . Transportation needs - non-medical: None  Occupational History  . None  Tobacco Use  . Smoking status: Never Smoker  . Smokeless tobacco: Never Used  . Tobacco comment: rarely smoked when he did smoke   Substance and Sexual Activity  . Alcohol use: No    Alcohol/week: 0.0 oz  . Drug use: No  . Sexual activity: Not Currently  Other Topics Concern  . None  Social History Narrative  . None     Family History:  The patient's family history includes Alcohol abuse in his father; Alzheimer's disease in his father; Cancer in his brother, mother, and sister; Depression in his father; Heart disease in his brother and mother; Stroke in  his mother.     ROS:   Please see the history of present illness.    ROS All other systems reviewed and are negative.   PHYSICAL EXAM:   VS:  BP 130/62   Pulse (!) 56   Ht 5\' 9"  (1.753 m)   Wt 182 lb 6.4 oz (82.7 kg)   BMI 26.94 kg/m    GEN: Well nourished, well developed, in no acute distress  HEENT: normal  Neck: no JVD, carotid bruits, or masses Cardiac: RRR; 2/6 SEM. No rubs, or gallops,no edema  Respiratory:  clear to auscultation bilaterally, normal work of breathing GI: soft, nontender, nondistended, + BS MS: no deformity or atrophy  Skin: warm and dry, no rash Neuro:  Alert and Oriented x 3, Strength and sensation are intact Psych: euthymic mood, full affect   Wt Readings from Last 3 Encounters:  09/24/17 182 lb 6.4 oz (82.7 kg)  09/17/17 174 lb 4.8 oz (79.1 kg)  09/11/17 183 lb 8 oz (83.2 kg)      Studies/Labs Reviewed:   EKG:  EKG is ordered today.  The ekg ordered today demonstrates sinus brady HR 56 with PVCs and old LBBB  Recent Labs: 07/21/2017: TSH 13.400 09/11/2017: ALT 20; B Natriuretic Peptide 295.8 09/16/2017: Hemoglobin 11.5; Magnesium 2.0; Platelets 141 09/17/2017: BUN 17; Creatinine, Ser 1.41; Potassium 4.1; Sodium 138   Lipid Panel      Component Value Date/Time   CHOL  04/25/2007 0415    119        ATP III CLASSIFICATION:  <200     mg/dL   Desirable  200-239  mg/dL   Borderline High  >=240    mg/dL   High   TRIG 124 04/25/2007 0415   HDL 45 04/25/2007 0415   CHOLHDL 2.6 04/25/2007 0415   VLDL 25 04/25/2007 0415   LDLCALC  04/25/2007 0415    49        Total Cholesterol/HDL:CHD Risk Coronary Heart Disease Risk Table                     Men   Women  1/2 Average Risk   3.4   3.3    Additional studies/ records that were reviewed today include:   TAVR OPERATIVE NOTE   Date of Procedure:09/15/2017 Procedure:   Transcatheter Aortic Valve Replacement - PercutaneousRightTransfemoral Approach Edwards Sapien 3 THV (size 52mm, model # 9600TFX, serial # [id number])  Co-Surgeons:Bryan Alveria Apley, MD and Lauree Chandler, MD   Pre-operative Echo Findings: ? Severe aortic stenosis ? Normalleft ventricular systolic function  Post-operative Echo Findings: ? Noparavalvular leak ? Normalleft ventricular systolic function  _________________  Post operative echo 09/16/17 Study Conclusions - Left ventricle: The cavity size was normal. There was severe focal basal and mild concentric hypertrophy. Systolic function was normal. The estimated ejection fraction was in the range of 60% to 65%. Wall motion was normal; there were no regional wall motion abnormalities. Features are consistent with a pseudonormal left ventricular filling pattern, with concomitant abnormal relaxation and increased filling pressure (grade 2 diastolic dysfunction). Doppler parameters are consistent with high ventricular filling pressure. - Aortic valve: S/P TAVR with 78mm Edwards Sapien bioprosthesis that is normally functioning with trivial perivalvular AI Mean gradient (S): 13 mm Hg. Valve area (VTI): 1.68 cm^2. Valve area (Vmax): 1.64 cm^2. Valve  area (Vmean): 1.9 cm^2. - Mitral valve: Calcified annulus. There was mild regurgitation. - Left atrium: The atrium was mildly dilated.    ASSESSMENT & PLAN:  Severe AS s/p TAVR: doing well 1 week s/p TAVR. Groin sites are stable. ECG shows sinus brady, old LBBB and HR 56. He is cleared to resume driving and all normal activities. Continue ASA and plavix. SBE prophylaxis discussed. We will call in Amoxil.  I will see him back in clinic on 2/20 for 1 month follow up with echo.   HTN:BP well controlled today   CADs/p CABG:pre TAVR heart catheterization showed widely patent bypass grafts. There was up to 85% stenosis of the RCA that was unable to be restented. Medical therapy was recommended.   CKD stage IXB:OERQS has remained stable.   Chronic diastolic CHF:he appears euvolemic.    Medication Adjustments/Labs and Tests Ordered: Current medicines are reviewed at length with the patient today.  Concerns regarding medicines are outlined above.  Me dication changes, Labs and Tests ordered today are listed in the Patient Instructions below. Patient Instructions  Medication Instructions:  1) TAKE AMOXIL 2g (four 500mg  tablets) one hour prior to dental procedures.  Labwork: None  Testing/Procedures: No new orders  Follow-Up: Please keep your upcoming appointments!  Any Other Special Instructions Will Be Listed Below (If Applicable).     If you need a refill on your cardiac medications before your next appointment, please call your pharmacy.      Signed, Angelena Form, PA-C  09/24/2017 [facility] [place], [place], [place]  [address] Phone: [phone]; Fax: [phone]

## 2017-09-22 NOTE — Telephone Encounter (Signed)
Patients insurance is active and benefits verified through [employer] - $20.00 co-pay, no deductible, out of pocket amount of $4,000/$325.48 has been met, no co-insurance, and no pre-authorization is required. Passport/reference #[id number] ° °Patient will be contacted and scheduled after their follow up appt with the Cardiologist office upon review by the RN Navigator. °

## 2017-09-23 ENCOUNTER — Other Ambulatory Visit: Payer: Self-pay

## 2017-09-23 DIAGNOSIS — I350 Nonrheumatic aortic (valve) stenosis: Secondary | ICD-10-CM

## 2017-09-23 DIAGNOSIS — Z952 Presence of prosthetic heart valve: Secondary | ICD-10-CM

## 2017-09-24 ENCOUNTER — Ambulatory Visit: Payer: Medicare Other | Admitting: Physician Assistant

## 2017-09-24 ENCOUNTER — Encounter: Payer: Self-pay | Admitting: Physician Assistant

## 2017-09-24 VITALS — BP 130/62 | HR 56 | Ht 69.0 in | Wt 182.4 lb

## 2017-09-24 DIAGNOSIS — N189 Chronic kidney disease, unspecified: Secondary | ICD-10-CM

## 2017-09-24 DIAGNOSIS — Z951 Presence of aortocoronary bypass graft: Secondary | ICD-10-CM | POA: Diagnosis not present

## 2017-09-24 DIAGNOSIS — I5032 Chronic diastolic (congestive) heart failure: Secondary | ICD-10-CM

## 2017-09-24 DIAGNOSIS — Z952 Presence of prosthetic heart valve: Secondary | ICD-10-CM

## 2017-09-24 DIAGNOSIS — I10 Essential (primary) hypertension: Secondary | ICD-10-CM

## 2017-09-24 MED ORDER — AMOXICILLIN 500 MG PO TABS: ORAL_TABLET | ORAL | 3 refills | Status: DC

## 2017-09-24 NOTE — Patient Instructions (Signed)
Medication Instructions:  1) TAKE AMOXIL 2g (four 500mg  tablets) one hour prior to dental procedures.  Labwork: None  Testing/Procedures: No new orders  Follow-Up: Please keep your upcoming appointments!  Any Other Special Instructions Will Be Listed Below (If Applicable).     If you need a refill on your cardiac medications before your next appointment, please call your pharmacy.

## 2017-10-01 ENCOUNTER — Telehealth (HOSPITAL_COMMUNITY): Payer: Self-pay

## 2017-10-01 NOTE — Telephone Encounter (Signed)
Called to speak with patient in regards to Cardiac Rehab - wife answered and stated patient was not at home. She stated she will have patient return phone call. If no phone call, will follow up next week.

## 2017-10-07 ENCOUNTER — Ambulatory Visit (INDEPENDENT_AMBULATORY_CARE_PROVIDER_SITE_OTHER): Payer: Medicare Other | Admitting: Physician Assistant

## 2017-10-07 ENCOUNTER — Ambulatory Visit (HOSPITAL_COMMUNITY): Payer: Medicare Other | Attending: Cardiology

## 2017-10-07 ENCOUNTER — Encounter: Payer: Self-pay | Admitting: Physician Assistant

## 2017-10-07 ENCOUNTER — Other Ambulatory Visit: Payer: Self-pay

## 2017-10-07 VITALS — BP 122/72 | HR 59 | Ht 69.0 in | Wt 183.0 lb

## 2017-10-07 DIAGNOSIS — I2510 Atherosclerotic heart disease of native coronary artery without angina pectoris: Secondary | ICD-10-CM | POA: Diagnosis not present

## 2017-10-07 DIAGNOSIS — Z951 Presence of aortocoronary bypass graft: Secondary | ICD-10-CM

## 2017-10-07 DIAGNOSIS — I350 Nonrheumatic aortic (valve) stenosis: Secondary | ICD-10-CM | POA: Diagnosis not present

## 2017-10-07 DIAGNOSIS — I447 Left bundle-branch block, unspecified: Secondary | ICD-10-CM | POA: Diagnosis not present

## 2017-10-07 DIAGNOSIS — Z952 Presence of prosthetic heart valve: Secondary | ICD-10-CM

## 2017-10-07 DIAGNOSIS — N189 Chronic kidney disease, unspecified: Secondary | ICD-10-CM | POA: Diagnosis not present

## 2017-10-07 DIAGNOSIS — I5032 Chronic diastolic (congestive) heart failure: Secondary | ICD-10-CM | POA: Diagnosis not present

## 2017-10-07 DIAGNOSIS — I358 Other nonrheumatic aortic valve disorders: Secondary | ICD-10-CM | POA: Insufficient documentation

## 2017-10-07 DIAGNOSIS — Z8673 Personal history of transient ischemic attack (TIA), and cerebral infarction without residual deficits: Secondary | ICD-10-CM | POA: Diagnosis not present

## 2017-10-07 DIAGNOSIS — Z8249 Family history of ischemic heart disease and other diseases of the circulatory system: Secondary | ICD-10-CM | POA: Diagnosis not present

## 2017-10-07 DIAGNOSIS — I739 Peripheral vascular disease, unspecified: Secondary | ICD-10-CM | POA: Insufficient documentation

## 2017-10-07 DIAGNOSIS — I270 Primary pulmonary hypertension: Secondary | ICD-10-CM | POA: Insufficient documentation

## 2017-10-07 DIAGNOSIS — I10 Essential (primary) hypertension: Secondary | ICD-10-CM | POA: Diagnosis not present

## 2017-10-07 NOTE — Progress Notes (Signed)
[facility] [place]                                       Cardiology Office Note    Date:  10/08/2017   ID:  LANDERS PRAJAPATI, DOB 1934/01/23, MRN [id number]  PCP:  Deland Pretty, MD  Cardiologist: Dr. Debara Pickett / Dr. Angelena Form & Dr. Cyndia Bent (TAVR)  CC: 1 month sp TAVR  History of Present Illness:  [name] is a 84 y.o. male with a history of  HTN, CAD s/p CABG (1996) and multiple re interventions on the RCA,LBBB,sinus bradycardia,carotid artery disease s/p bilateral CEAs, CKD stage III, previous CVA (2008), and severe AS s/p TAVR (09/15/17) who presents to clinic for follow up.   The patient's cardiac history dates back to 1996 when he underwent coronary artery bypass grafting x3 by Dr. Cyndia Bent with grafts placed including left internal mammary artery to the distal left anterior descending coronary artery, saphenous vein graft to the first obtuse marginal branch, and saphenous vein graft to the second obtuse marginal branch. The patient recovered uneventfully and did very well for a long period of time. He developed symptoms of exertional angina in 2013 and underwent catheterization that revealed continued patency of all 3 previously placed bypass grafts but development of right coronary artery stenosis. The right coronary system was not grafted at the time of the patient's original bypass surgery, and he was subsequently treated with PCI and stenting. He developed restenosis requiring repeat PCI. Since then he has done fairly well. He was noted to have a heart murmur on physical exam and diagnosed with aortic stenosis that has progressed in severity over the last several years during which time the patient has been followed by Dr. Debara Pickett. Most recent echocardiogram performed May 07, 2017 revealed moderate to severe aortic stenosis with peak velocity across the aortic valve reported 3.8 m/s corresponding to mean transvalvular gradient estimated 30  mmHg. The DVI was reported 0.32 corresponding to aortic valve area calculated between 1.04 and 1.13 cm. Left ventricular systolic function remain normal. Over the last few months patient developed progressive symptoms of exertional shortness of breath and chest tightness. The patient was seen in follow-up by Dr. Debara Pickett and subsequently underwent diagnostic cardiac catheterization by Dr. Tamala Julian on July 30, 2017. Findings were notable for severe multivessel coronary artery disease with chronic total occlusion of the left anterior descending coronary artery and left circumflex coronary arteries. There was continued patency of the left internal mammary artery graft to the distal left anterior descending coronary artery, saphenous vein graft to the first obtuse marginal branch, and saphenous vein graft to the second obtuse marginal branch. There was diffuse mild in-stent restenosis involving overlapping stents in the proximal right coronary artery with 50-70% eccentric stenosis in the mid right coronary artery and 85% stenosis in the posterolateral branch of the distal right coronary artery. Mean transvalvular gradient across the aortic valve was measured 30 mmHg corresponding to aortic valve area calculated 0.95 cm. There were normal right heart pressures.  He underwent successful TAVR with a 29 mm Edwards Sapein THV via the R TF approach on 09/15/17.Postoperative echoshowed a normally functioning THV with trivial PVL; mean gradient 13 mmHg.He was discharged on ASA and plavix.  Today he presents to clinic for follow up. No CP or SOB. No LE edema, orthopnea or PND. No dizziness or syncope.  No blood in stool or urine. No palpitations. He has been out doing everything he wants to. He is eager to get back walking outside when the weather gets better.     Past Medical History:  Diagnosis Date  . CAD (coronary artery disease)    a. s/p CABG in 1996 and subsequent PCI  . Carotid artery disease    a.  Rt. CEA, 08/04/07, Lt. CEA 04/26/07.  . Chronic kidney disease   . CKD (chronic kidney disease) stage 2, GFR 60-89 ml/min 09/16/2011  . Coronary artery disease   . History of CVA (cerebrovascular accident)    a. 2008  . HTN (hypertension),uncontrolled 09/16/2011  . Hypertension   . LBBB (left bundle branch block)   . Peripheral arterial disease ([place])   . Pulmonary hypertension, mild 09/17/2011  . S/P TAVR (transcatheter aortic valve replacement)    a. 09/15/17: s/p Edwards Sapien 3 THV (size 26 mm, model # 9600TFX, serial # [id number])    Past Surgical History:  Procedure Laterality Date  .  Fluid knee Right Sept. 6, 2014   Fluid removed  . CARDIAC CATHETERIZATION Left 06/10/2012   Ostial RCA 99% stenosis pre-dilation 3x30mm Angiosculpt PTCA balloon, 3.5x4mm Promus Element DES overlapping proximal stent and into the Aorto-ostium, post-dilated with4x71mm  Trek balloon - 99% stenosis reduced to 0%  . CARDIAC CATHETERIZATION Left 12/22/2011   Left Main-diffuse tapering 50-60% stenosis; mild-moderate diseaseof the right ventricle branch of the right coronary artery; will plan FFR of ostial RCA  . CARDIAC CATHETERIZATION Bilateral 09/16/2011   Severe 90% ostial RCA disease which is calcified, will likely need PCI to ostial RCA with calcification  . CARDIAC CATHETERIZATION  09/18/2011   Calcified ostial 90-95% proximal RCA stenosis, 3x55mm Emerge balloon predilation, 3x78mm DES PROMUS Element stent was placed, post stent dilation done utilizing a 3.25x38mm noncompliant Quantum Apex balloon, 90-95% stenosis reduced to 0%  . CEA  2008  . CORONARY ANGIOPLASTY    . CORONARY ARTERY BYPASS GRAFT  08/04/2007  . FRACTIONAL FLOW RESERVE WIRE  12/22/2011   Ostial RCA, FFR ratio-0.82, physiologically significant, 3.25x63mm Flextome Cutting balloon deployed at ~3.63mm final estimated diameter  . FRACTIONAL FLOW RESERVE WIRE Right 12/22/2011   Procedure: FRACTIONAL FLOW RESERVE WIRE;  Surgeon: Pixie Casino, MD;   Location[facility] C[facility] LAB;  Service: Cardiovascular;  Laterality: Right;  . LEFT AND RIGHT HEART CATHETERIZATION WITH CORONARY ANGIOGRAM Left 09/16/2011   Procedure: LEFT AND RIGHT HEART CATHETERIZATION WITH CORONARY ANGIOGRAM;  Surgeon: Pixie Casino, MD;  Location: [facility] CATH LAB;  Service: Cardiovascular;  Laterality: Left;  . LEFT HEART CATHETERIZATION WITH CORONARY ANGIOGRAM N/A 06/10/2012   Procedure: LEFT HEART CATHETERIZATION WITH CORONARY ANGIOGRAM;  Surgeon: Pixie Casino, MD;  Location: [facility] CATH LAB;  Service: Cardiovascular;  Laterality: N/A;  . LEFT HEART CATHETERIZATION WITH CORONARY/GRAFT ANGIOGRAM N/A 12/22/2011   Procedure: LEFT HEART CATHETERIZATION WITH Beatrix Fetters;  Surgeon: Pixie Casino, MD;  Location: [facility] CATH LAB;  Service: Cardiovascular;  Laterality: N/A;  . PERCUTANEOUS CORONARY ROTOBLATOR INTERVENTION (PCI-R)  09/18/2011   Procedure: PERCUTANEOUS CORONARY ROTOBLATOR INTERVENTION (PCI-R);  Surgeon: Troy Sine, MD;  Location: [facility] CATH LAB;  Service: Cardiovascular;;  . PERCUTANEOUS CORONARY STENT INTERVENTION (PCI-S) N/A 09/18/2011   Procedure: PERCUTANEOUS CORONARY STENT INTERVENTION (PCI-S);  Surgeon: Troy Sine, MD;  Location: [facility] CATH LAB;  Service: Cardiovascular;  Laterality: N/A;  . RIGHT/LEFT HEART CATH AND CORONARY/GRAFT ANGIOGRAPHY N/A 07/30/2017   Procedure: RIGHT/LEFT HEART CATH AND CORONARY/GRAFT ANGIOGRAPHY;  Surgeon:  Belva Crome, MD;  Location: [place] CV LAB;  Service: Cardiovascular;  Laterality: N/A;  . TEE WITHOUT CARDIOVERSION N/A 09/15/2017   Procedure: TRANSESOPHAGEAL ECHOCARDIOGRAM (TEE);  Surgeon: Burnell Blanks, MD;  Location: [place];  Service: Open Heart Surgery;  Laterality: N/A;  . TEMPORARY PACEMAKER INSERTION  09/18/2011   Procedure: TEMPORARY PACEMAKER INSERTION;  Surgeon: Troy Sine, MD;  Location: [facility] CATH LAB;  Service: Cardiovascular;;  . TRANSCATHETER AORTIC VALVE REPLACEMENT, TRANSFEMORAL N/A 09/15/2017    Procedure: TRANSCATHETER AORTIC VALVE REPLACEMENT, TRANSFEMORAL;  Surgeon: Burnell Blanks, MD;  Location: [place];  Service: Open Heart Surgery;  Laterality: N/A;    Current Medications: Outpatient Medications Prior to Visit  Medication Sig Dispense Refill  . amLODipine (NORVASC) 10 MG tablet Take 1 tablet (10 mg total) by mouth at bedtime. 30 tablet 6  . amoxicillin (AMOXIL) 500 MG tablet Take 2 g (four 500 mg tablets) 1 hour prior to dental procedures. 4 tablet 3  . aspirin EC 81 MG tablet Take 81 mg by mouth at bedtime.     . clobetasol (TEMOVATE) 0.05 % external solution Apply 1 application topically 2 (two) times daily as needed. For itchy/rash scalp  3  . clopidogrel (PLAVIX) 75 MG tablet Take 1 tablet (75 mg total) by mouth daily. (Patient taking differently: Take 75 mg by mouth at bedtime. ) 90 tablet 3  . fish oil-omega-3 fatty acids 1000 MG capsule Take 1 g by mouth 2 (two) times daily.     . halobetasol (ULTRAVATE) 0.05 % ointment Apply 1 application topically daily as needed. For skin rash.    . isosorbide mononitrate (IMDUR) 30 MG 24 hr tablet Take 30 mg by mouth 3 (three) times daily.    . metoprolol tartrate (LOPRESSOR) 50 MG tablet Take 25 mg by mouth 2 (two) times daily.    . niacin (NIASPAN) 500 MG CR tablet Take 500 mg by mouth at bedtime.    . nitroGLYCERIN (NITROSTAT) 0.4 MG SL tablet Place 1 tablet (0.4 mg total) under the tongue every 5 (five) minutes x 3 doses as needed. 25 tablet 3  . rosuvastatin (CRESTOR) 40 MG tablet Take 20 mg by mouth at bedtime. Take 1/2  tablet by mouth daily.     Facility-Administered Medications Prior to Visit  Medication Dose Route Frequency Provider Last Rate Last Dose  . sodium chloride 0.9 % injection 3 mL  3 mL Intravenous PRN [name], Nadean Corwin, MD         Allergies:   Atorvastatin   Social History   Socioeconomic History  . Marital status: Married    Spouse name: None  . Number of children: None  . Years of education: None   . Highest education level: None  Social Needs  . Financial resource strain: None  . Food insecurity - worry: None  . Food insecurity - inability: None  . Transportation needs - medical: None  . Transportation needs - non-medical: None  Occupational History  . None  Tobacco Use  . Smoking status: Never Smoker  . Smokeless tobacco: Never Used  . Tobacco comment: rarely smoked when he did smoke   Substance and Sexual Activity  . Alcohol use: No    Alcohol/week: 0.0 oz  . Drug use: No  . Sexual activity: Not Currently  Other Topics Concern  . None  Social History Narrative  . None     Family History:  The patient's family history includes Alcohol abuse in his father; Alzheimer's  disease in his father; Cancer in his brother, mother, and sister; Depression in his father; Heart disease in his brother and mother; Stroke in his mother.      ROS:   Please see the history of present illness.    ROS All other systems reviewed and are negative.   PHYSICAL EXAM:   VS:  BP 122/72   Pulse (!) 59   Ht 5\' 9"  (1.753 m)   Wt 183 lb (83 kg)   SpO2 99%   BMI 27.02 kg/m    GEN: Well nourished, well developed, in no acute distress  HEENT: normal  Neck: no JVD, carotid bruits, or masses Cardiac: RRR; 2/6 SEM. No, rubs, or gallops,no edema  Respiratory:  clear to auscultation bilaterally, normal work of breathing GI: soft, nontender, nondistended, + BS MS: no deformity or atrophy  Skin: warm and dry, no rash Neuro:  Alert and Oriented x 3, Strength and sensation are intact Psych: euthymic mood, full affect   Wt Readings from Last 3 Encounters:  10/07/17 183 lb (83 kg)  09/24/17 182 lb 6.4 oz (82.7 kg)  09/17/17 174 lb 4.8 oz (79.1 kg)      Studies/Labs Reviewed:   EKG:  EKG is NOT ordered today.   Recent Labs: 07/21/2017: TSH 13.400 09/11/2017: ALT 20; B Natriuretic Peptide 295.8 09/16/2017: Hemoglobin 11.5; Magnesium 2.0; Platelets 141 09/17/2017: BUN 17; Creatinine, Ser 1.41;  Potassium 4.1; Sodium 138   Lipid Panel    Component Value Date/Time   CHOL  04/25/2007 0415    119        ATP III CLASSIFICATION:  <200     mg/dL   Desirable  200-239  mg/dL   Borderline High  >=240    mg/dL   High   TRIG 124 04/25/2007 0415   HDL 45 04/25/2007 0415   CHOLHDL 2.6 04/25/2007 0415   VLDL 25 04/25/2007 0415   LDLCALC  04/25/2007 0415    49        Total Cholesterol/HDL:CHD Risk Coronary Heart Disease Risk Table                     Men   Women  1/2 Average Risk   3.4   3.3    Additional studies/ records that were reviewed today include:  TAVR OPERATIVE NOTE   Date of Procedure:09/15/2017 Procedure:   Transcatheter Aortic Valve Replacement - PercutaneousRightTransfemoral Approach Edwards Sapien 3 THV (size 40mm, model # 9600TFX, serial # [id number])  Co-Surgeons:Bryan Alveria Apley, MD and Lauree Chandler, MD   Pre-operative Echo Findings: ? Severe aortic stenosis ? Normalleft ventricular systolic function  Post-operative Echo Findings: ? Noparavalvular leak ? Normalleft ventricular systolic function  _________________  Post operative echo 09/16/17 Study Conclusions - Left ventricle: The cavity size was normal. There was severe focal basal and mild concentric hypertrophy. Systolic function was normal. The estimated ejection fraction was in the range of 60% to 65%. Wall motion was normal; there were no regional wall motion abnormalities. Features are consistent with a pseudonormal left ventricular filling pattern, with concomitant abnormal relaxation and increased filling pressure (grade 2 diastolic dysfunction). Doppler parameters are consistent with high ventricular filling pressure. - Aortic valve: S/P TAVR with 35mm Edwards Sapien bioprosthesis that is normally functioning with trivial perivalvular AI Mean gradient (S): 13 mm Hg. Valve area (VTI): 1.68  cm^2. Valve area (Vmax): 1.64 cm^2. Valve area (Vmean): 1.9 cm^2. - Mitral valve: Calcified annulus. There was mild regurgitation. - Left atrium: The  atrium was mildly dilated.  _________________   2D ECHO 09/2017 (1 month s/p TAVR) Study Conclusions - Left ventricle: The cavity size was normal. There was severe   focal basal and mild concentric hypertrophy. Systolic function   was normal. The estimated ejection fraction was in the range of   60% to 65%. Wall motion was normal; there were no regional wall   motion abnormalities. Features are consistent with a pseudonormal   left ventricular filling pattern, with concomitant abnormal   relaxation and increased filling pressure (grade 2 diastolic   dysfunction). Doppler parameters are consistent with high   ventricular filling pressure. - Aortic valve: S/P TAVR with 40mm Edwards Sapien bioprosthesis   that is normally functioning with trivial perivalvular AI Mean   gradient (S): 13 mm Hg. Valve area (VTI): 1.68 cm^2. Valve area   (Vmax): 1.64 cm^2. Valve area (Vmean): 1.9 cm^2. - Mitral valve: Calcified annulus. There was mild regurgitation. - Left atrium: The atrium was mildly dilated.   ASSESSMENT & PLAN:   Severe AS s/p TAVR: doing well. He has NHYA class I symptoms. 2D ECHO today shows EF 60-65% with a normally functioning TAVR valve with trivial PVL; Mean gradient (S): 13 mm Hg. He has Amoxil for SBE prophylaxis. Continue ASA and plavix. He can stop plavix after 6 months for TAVR valve. He was on ASA and plavix before his TAVR, so I will defer to Dr. Debara Pickett on whether or not this should be continued after 6 months.   HTN:BP well controlled today   CADs/p CABG:pre TAVR heart catheterization showed widely patent bypass grafts. There was up to 85% stenosis of the RCA that was unable to be restented. Medical therapy was recommended. Continue ASA, plavix, statin.  CKDstage VEL:FYBOF has remained stable ~1.4  Chronic  diastolic CHF: he appears euvolemic off diuretics.   Medication Adjustments/Labs and Tests Ordered: Current medicines are reviewed at length with the patient today.  Concerns regarding medicines are outlined above.  Medication changes, Labs and Tests ordered today are listed in the Patient Instructions below. Patient Instructions  Medication Instructions:  Your provider recommends that you continue on your current medications as directed. Please refer to the Current Medication list given to you today.    Labwork: None  Testing/Procedures: Your provider has requested that you have an echocardiogram in one year. Echocardiography is a painless test that uses sound waves to create images of your heart. It provides your doctor with information about the size and shape of your heart and how well your heart's chambers and valves are working. This procedure takes approximately one hour. There are no restrictions for this procedure.  Follow-Up: Your provider recommends that you schedule a follow-up appointment in 3 MONTHS with Dr. Debara Pickett.   Your provider wants you to follow-up in: 1 year with Nell Range, PA. You will receive a reminder letter in the mail two months in advance. If you don't receive a letter, please call our office to schedule the follow-up appointment.    Any Other Special Instructions Will Be Listed Below (If Applicable).     If you need a refill on your cardiac medications before your next appointment, please call your pharmacy.      Mable Fill, PA-C  10/08/2017 10:46 AM    [facility] [address], [place], [place]  [address] Phone: [phone]; Fax: [phone]

## 2017-10-07 NOTE — Patient Instructions (Signed)
Medication Instructions:  Your provider recommends that you continue on your current medications as directed. Please refer to the Current Medication list given to you today.    Labwork: None  Testing/Procedures: Your provider has requested that you have an echocardiogram in one year. Echocardiography is a painless test that uses sound waves to create images of your heart. It provides your doctor with information about the size and shape of your heart and how well your heart's chambers and valves are working. This procedure takes approximately one hour. There are no restrictions for this procedure.  Follow-Up: Your provider recommends that you schedule a follow-up appointment in 3 MONTHS with Dr. Debara Pickett.   Your provider wants you to follow-up in: 1 year with Nell Range, PA. You will receive a reminder letter in the mail two months in advance. If you don't receive a letter, please call our office to schedule the follow-up appointment.    Any Other Special Instructions Will Be Listed Below (If Applicable).     If you need a refill on your cardiac medications before your next appointment, please call your pharmacy.

## 2017-10-14 ENCOUNTER — Encounter (INDEPENDENT_AMBULATORY_CARE_PROVIDER_SITE_OTHER): Payer: Medicare Other | Admitting: Ophthalmology

## 2017-10-14 ENCOUNTER — Telehealth (HOSPITAL_COMMUNITY): Payer: Self-pay

## 2017-10-14 DIAGNOSIS — H43813 Vitreous degeneration, bilateral: Secondary | ICD-10-CM

## 2017-10-14 DIAGNOSIS — H34831 Tributary (branch) retinal vein occlusion, right eye, with macular edema: Secondary | ICD-10-CM | POA: Diagnosis not present

## 2017-10-14 DIAGNOSIS — I10 Essential (primary) hypertension: Secondary | ICD-10-CM | POA: Diagnosis not present

## 2017-10-14 DIAGNOSIS — H35033 Hypertensive retinopathy, bilateral: Secondary | ICD-10-CM

## 2017-10-14 NOTE — Telephone Encounter (Signed)
Attempted to follow up with patient in regards to Cardiac Rehab - lm on vm °

## 2017-10-21 ENCOUNTER — Other Ambulatory Visit: Payer: Self-pay | Admitting: Internal Medicine

## 2017-10-22 ENCOUNTER — Encounter (HOSPITAL_COMMUNITY): Payer: Self-pay

## 2017-10-22 ENCOUNTER — Telehealth (HOSPITAL_COMMUNITY): Payer: Self-pay

## 2017-10-22 NOTE — Telephone Encounter (Signed)
2nd attempt to call patient in regards to Cardiac Rehab - lm on vm. Sending letter. °

## 2017-10-29 ENCOUNTER — Telehealth (HOSPITAL_COMMUNITY): Payer: Self-pay

## 2017-10-29 NOTE — Telephone Encounter (Signed)
Called to speak with patient in regards to Cardiac Rehab - Patient stated that him and his wife just got custody of their grandson and participating in [place] is not something he can do right now. He stated he is being very active and feels good. Closed referral.

## 2017-11-05 ENCOUNTER — Encounter: Payer: Self-pay | Admitting: Thoracic Surgery (Cardiothoracic Vascular Surgery)

## 2017-11-18 ENCOUNTER — Encounter (INDEPENDENT_AMBULATORY_CARE_PROVIDER_SITE_OTHER): Payer: Medicare Other | Admitting: Ophthalmology

## 2017-11-18 DIAGNOSIS — H35033 Hypertensive retinopathy, bilateral: Secondary | ICD-10-CM | POA: Diagnosis not present

## 2017-11-18 DIAGNOSIS — H34831 Tributary (branch) retinal vein occlusion, right eye, with macular edema: Secondary | ICD-10-CM

## 2017-11-18 DIAGNOSIS — H43813 Vitreous degeneration, bilateral: Secondary | ICD-10-CM

## 2017-11-18 DIAGNOSIS — I10 Essential (primary) hypertension: Secondary | ICD-10-CM

## 2017-11-26 ENCOUNTER — Telehealth: Payer: Self-pay | Admitting: Internal Medicine

## 2017-11-26 DIAGNOSIS — R0600 Dyspnea, unspecified: Secondary | ICD-10-CM

## 2017-11-26 DIAGNOSIS — Z952 Presence of prosthetic heart valve: Secondary | ICD-10-CM

## 2017-11-26 NOTE — Telephone Encounter (Signed)
New message  Patient calling with concerns of shortness of breath and dizziness  Pt c/o Shortness Of Breath: STAT if SOB developed within the last 24 hours or pt is noticeably SOB on the phone  1. Are you currently SOB (can you hear that pt is SOB on the phone)? NO  2. How long have you been experiencing SOB? 4 days  3. Are you SOB when sitting or when up moving around? Moving around  4. Are you currently experiencing any other symptoms? Dizziness

## 2017-11-26 NOTE — Telephone Encounter (Signed)
Returned call to patient-patient reports SOB with exertion x 2-3 days.  Reports he was doing well, has been active, back to "his old self" post TAVR until 2-3 days ago.    SOB only with exertion, denies SOB at rest.   Denies dizziness, CP, edema. Does not weigh at home or monitor BP/HR, no readings to report.   Denies increase in sodium intake.    Hx: TAVR 09/15/17, CAD s/p CABG 1996 w/ reinterventions, chronic diastolic HF.   OV with K. Grandville Silos PA 2/20.     Advised I would route to Dr. Debara Pickett for review/recommendations and will send message to scheduling to assist with getting patient scheduled for follow up visit.

## 2017-11-28 NOTE — Telephone Encounter (Signed)
I would recommend a limited echo for EF and AV gradients

## 2017-11-30 NOTE — Telephone Encounter (Signed)
Patient aware of MD recommendations. He agrees w/plan. Test ordered. Staff message sent to [facility] scheduling pool to arrange study

## 2017-12-01 ENCOUNTER — Ambulatory Visit (HOSPITAL_COMMUNITY): Payer: Medicare Other | Attending: Cardiovascular Disease

## 2017-12-01 ENCOUNTER — Other Ambulatory Visit: Payer: Self-pay

## 2017-12-01 DIAGNOSIS — R0600 Dyspnea, unspecified: Secondary | ICD-10-CM | POA: Diagnosis present

## 2017-12-01 DIAGNOSIS — I2510 Atherosclerotic heart disease of native coronary artery without angina pectoris: Secondary | ICD-10-CM | POA: Diagnosis not present

## 2017-12-01 DIAGNOSIS — Z952 Presence of prosthetic heart valve: Secondary | ICD-10-CM | POA: Diagnosis present

## 2017-12-01 DIAGNOSIS — Z8673 Personal history of transient ischemic attack (TIA), and cerebral infarction without residual deficits: Secondary | ICD-10-CM | POA: Insufficient documentation

## 2017-12-01 DIAGNOSIS — I10 Essential (primary) hypertension: Secondary | ICD-10-CM | POA: Insufficient documentation

## 2017-12-01 DIAGNOSIS — I340 Nonrheumatic mitral (valve) insufficiency: Secondary | ICD-10-CM | POA: Diagnosis not present

## 2017-12-30 ENCOUNTER — Encounter (INDEPENDENT_AMBULATORY_CARE_PROVIDER_SITE_OTHER): Payer: Medicare Other | Admitting: Ophthalmology

## 2017-12-30 DIAGNOSIS — H34831 Tributary (branch) retinal vein occlusion, right eye, with macular edema: Secondary | ICD-10-CM

## 2017-12-30 DIAGNOSIS — I10 Essential (primary) hypertension: Secondary | ICD-10-CM

## 2017-12-30 DIAGNOSIS — H35033 Hypertensive retinopathy, bilateral: Secondary | ICD-10-CM | POA: Diagnosis not present

## 2017-12-30 DIAGNOSIS — H43813 Vitreous degeneration, bilateral: Secondary | ICD-10-CM | POA: Diagnosis not present

## 2018-01-05 ENCOUNTER — Ambulatory Visit: Payer: Medicare Other | Admitting: Internal Medicine

## 2018-01-05 ENCOUNTER — Encounter: Payer: Self-pay | Admitting: Internal Medicine

## 2018-01-05 VITALS — BP 136/80 | HR 51 | Ht 69.0 in | Wt 179.0 lb

## 2018-01-05 DIAGNOSIS — Z952 Presence of prosthetic heart valve: Secondary | ICD-10-CM | POA: Diagnosis not present

## 2018-01-05 DIAGNOSIS — E782 Mixed hyperlipidemia: Secondary | ICD-10-CM | POA: Diagnosis not present

## 2018-01-05 DIAGNOSIS — I10 Essential (primary) hypertension: Secondary | ICD-10-CM

## 2018-01-05 DIAGNOSIS — Z951 Presence of aortocoronary bypass graft: Secondary | ICD-10-CM | POA: Diagnosis not present

## 2018-01-05 LAB — LIPID PANEL
Chol/HDL Ratio: 2.6 ratio (ref 0.0–5.0)
Cholesterol, Total: 164 mg/dL (ref 100–199)
HDL: 63 mg/dL (ref >39)
LDL CALC: 76 mg/dL (ref 0–99)
Triglycerides: 125 mg/dL (ref 0–149)
VLDL CHOLESTEROL CAL: 25 mg/dL (ref 5–40)

## 2018-01-05 NOTE — Patient Instructions (Addendum)
Your physician recommends that you return for lab work FASTING to check cholesterol   Your physician wants you to follow-up in: 6 months with Dr. [name]. You will receive a reminder letter in the mail two months in advance. If you don't receive a letter, please call our office to schedule the follow-up appointment.  

## 2018-01-06 ENCOUNTER — Encounter: Payer: Self-pay | Admitting: Internal Medicine

## 2018-01-06 ENCOUNTER — Other Ambulatory Visit: Payer: Self-pay

## 2018-01-06 DIAGNOSIS — Z951 Presence of aortocoronary bypass graft: Secondary | ICD-10-CM | POA: Insufficient documentation

## 2018-01-06 MED ORDER — ROSUVASTATIN CALCIUM 40 MG PO TABS: 40.0000 mg | ORAL_TABLET | Freq: Every day | ORAL | 0 refills | Status: DC

## 2018-01-06 NOTE — Progress Notes (Addendum)
OFFICE NOTE  Chief Complaint:  Follow-up dyspnea, TAVR  Primary Care Physician: Deland Pretty, MD  HPI:  [name] is a 84 year old gentleman who had unfortunate aggressive restenosis of an RCA lesion in the past, recently restented. He presented with worsening anginal symptoms and medical therapy was recommended in November. He was scheduled for a nuclear stress test which he underwent on July 21, 2012, and this was negative for ischemia. Recently, he had been experiencing some soreness in his left chest wall which has been going on and off for about a month. He is not experiencing the shortness of breath that he previously experienced with his angina, especially when walking up hills. He noted that he had an episode of a fall when he was trying to lift a very heavy deer, landed on his back and perhaps strained or sprained intercostal muscles or something in the chest wall. Based on his symptoms he underwent stress testing which indicated ischemia and he underwent a repeat coronary intervention to the RCA.   At his last office visit he was doing fairly well, without worsening shortness of breath or chest discomfort.  She did make it [place] but noted a small increase in shortness of breath with activity. He's also had weight gain recently and some lower extremity swelling. He says his symptoms are somewhat reminiscent of the symptoms that required prior coronary intervention about one year ago, but not as significant. Of note, he does have moderate to severe aortic stenosis which were touching very closely and a history of diastolic dysfunction plus or minus heart failure.  [name] returns today and is feeling quite well. He denies any chest pain or shortness of breath. He told me of an episode recently where he helped deliver car and had to walk back home. Walked about 2 half miles during the middle of the day in the heat and had no problems or shortness of  breath or chest discomfort. He actually is feeling the best he has in some time.  [name] back in the office today. He continues to be active. He's been hunting recently and doing a lot of exertion without any worsening shortness of breath or chest pain. He recently had problems with some pain in his legs which he attributed to combination of Crestor and that he had. He discontinued his area and wean down his Crestor. He is currently getting 40 mg tablets and breaking them into thirds. He's taking one third of a 40 mg tablet are roughly 13 mg daily which she says is tolerable. His most recent lipid profile from September 2015 shows a total cholesterol 118, temperature 31, HDL 59 and LDL of 43 which is very low. He should be able to tolerate the decrease in his Crestor and still be at target numbers. He did have a lipoma profile and MR in March 2015. I believe there was an air in triglycerides which read over 700, but LDL-C was less than 100, LDL-P was 700 and generally showed a very favorable profile.  [name] returns today in the office for follow-up. Overall he reports doing fairly well. He continues to be active during the summer denies any chest pain or worsening shortness of breath. He has follow-up of his carotid endarterectomies in November. We are following him for aortic stenosis and he does not seem to be symptomatic with that.  [name] back in the office today for follow-up. He  reports that he has developed again some exertional chest pain over the past few weeks. He feels like a soreness in the left chest wall, but it is also deep inside. Can come at rest, but worse with exertion. There is some exertional dyspnea as well. He has moderate AS by his most recent echo in 03/2015.   [name] returns today for follow-up. He underwent nuclear stress testing which was negative for ischemia. EF was normal at 58%. He reports his chest discomfort is gone away. He is now getting over a chest cold.  I suspect some of his pain was musculoskeletal.  04/14/2016  [name] was seen back today in the office for follow-up. He reports that he is doing very well denies any shortness of breath or chest pain. He is very physically active. In February he underwent nuclear stress testing which was negative for ischemia. We are following moderate aortic stenosis which was seen on his echo last year. He does have stage III chronic kidney disease as well. He's followed by vein and vascular surgery for bilateral carotid endarterectomies. Blood pressure is well-controlled today. Recent lipid profile was within normal limits and is followed by Dr. Wilson [name].  10/13/2016  [name] was seen again in follow-up today. He reports feeling "scaringly well". He says he has not felt this well on long time. 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 He denies any chest pain or worsening shortness of breath. He did have an echocardiogram which showed moderate aortic stenosis. Continue to follow that as it is likely to progress despite adequate control of blood pressure and cholesterol. He also has carotid artery disease and is followed by vein and vascular surgery.  04/16/2017  [name] returns today for follow-up. He denies any new chest pain or worsening shortness of breath. His blood pressures been well controlled. Where continue to follow his aortic stenosis and he has an upcoming echo in September. He recently changed primary care providers to Dr. [name], since Dr. Wilson [name] has retired. He denies any anginal chest pain.  07/17/2017  [name] was seen today in follow-up.  He says over the past several months he has had some increasing shortness of breath, chest pain and fatigue.  In September he had a repeat echo which showed a stable aortic valve gradient and moderate aortic stenosis.  He has a history of recurrent coronary artery disease  requiring multiple coronary interventions.  Up until recently he has done well without anginal symptoms.  He is noted recently however while hunting or doing other activities requiring exertion, that has had more frequent chest pain, shortness of breath and some fatigue.  He his primary care provider increased his isosorbide up to 90 mg twice daily.  Despite this change is not noted an improvement in his symptoms.  01/05/2018  [name] returns today for follow-up.  He recently called in the office and was reporting significant dyspnea.  This started one morning however by the afternoon had totally resolved.  He has had no further symptoms.  Recently I referred him for evaluation of aortic stenosis and ultimately he was determined to be a candidate for TAVR.  He underwent TAVR on 09/15/2017 with Dr. Angelena Form and Dr. Cyndia Bent.  He had placement of an Edwards Sapien 3 valve (26 mm).  Other than the one episode of shortness of breath, which was reevaluated by a limited echo that  showed normal valve gradients and stable postprocedural valve, he has done very well with marked improvement in his symptoms including improvement in exercise tolerance.  He has not had significant follow-up with his primary care provider since Dr. Wilson [name] retired.  He has not had additional lipid testing.  PMHx:  Past Medical History:  Diagnosis Date  . CAD (coronary artery disease)    a. s/p CABG in 1996 and subsequent PCI  . Carotid artery disease    a. Rt. CEA, 08/04/07, Lt. CEA 04/26/07.  . Chronic kidney disease   . CKD (chronic kidney disease) stage 2, GFR 60-89 ml/min 09/16/2011  . Coronary artery disease   . History of CVA (cerebrovascular accident)    a. 2008  . HTN (hypertension),uncontrolled 09/16/2011  . Hypertension   . LBBB (left bundle branch block)   . Peripheral arterial disease ([place])   . Pulmonary hypertension, mild 09/17/2011  . S/P TAVR (transcatheter aortic valve replacement)    a. 09/15/17: s/p Edwards Sapien 3  THV (size 26 mm, model # 9600TFX, serial # [id number])    Past Surgical History:  Procedure Laterality Date  .  Fluid knee Right Sept. 6, 2014   Fluid removed  . CARDIAC CATHETERIZATION Left 06/10/2012   Ostial RCA 99% stenosis pre-dilation 3x56mm Angiosculpt PTCA balloon, 3.5x3mm Promus Element DES overlapping proximal stent and into the Aorto-ostium, post-dilated with4x48mm [place]i Trek balloon - 99% stenosis reduced to 0%  . CARDIAC CATHETERIZATION Left 12/22/2011   Left Main-diffuse tapering 50-60% stenosis; mild-moderate diseaseof the right ventricle branch of the right coronary artery; will plan FFR of ostial RCA  . CARDIAC CATHETERIZATION Bilateral 09/16/2011   Severe 90% ostial RCA disease which is calcified, will likely need PCI to ostial RCA with calcification  . CARDIAC CATHETERIZATION  09/18/2011   Calcified ostial 90-95% proximal RCA stenosis, 3x54mm Emerge balloon predilation, 3x18mm DES PROMUS Element stent was placed, post stent dilation done utilizing a 3.25x73mm noncompliant Quantum Apex balloon, 90-95% stenosis reduced to 0%  . CEA  2008  . CORONARY ANGIOPLASTY    . CORONARY ARTERY BYPASS GRAFT  08/04/2007  . FRACTIONAL FLOW RESERVE WIRE  12/22/2011   Ostial RCA, FFR ratio-0.82, physiologically significant, 3.25x82mm Flextome Cutting balloon deployed at ~3.42mm final estimated diameter  . FRACTIONAL FLOW RESERVE WIRE Right 12/22/2011   Procedure: FRACTIONAL FLOW RESERVE WIRE;  Surgeon: Pixie Casino, MD;  Location[facility] C[facility] LAB;  Service: Cardiovascular;  Laterality: Right;  . LEFT AND RIGHT HEART CATHETERIZATION WITH CORONARY ANGIOGRAM Left 09/16/2011   Procedure: LEFT AND RIGHT HEART CATHETERIZATION WITH CORONARY ANGIOGRAM;  Surgeon: Pixie Casino, MD;  Location: [facility] CATH LAB;  Service: Cardiovascular;  Laterality: Left;  . LEFT HEART CATHETERIZATION WITH CORONARY ANGIOGRAM N/A 06/10/2012   Procedure: LEFT HEART CATHETERIZATION WITH CORONARY ANGIOGRAM;  Surgeon: Pixie Casino, MD;   Location: [facility] CATH LAB;  Service: Cardiovascular;  Laterality: N/A;  . LEFT HEART CATHETERIZATION WITH CORONARY/GRAFT ANGIOGRAM N/A 12/22/2011   Procedure: LEFT HEART CATHETERIZATION WITH Beatrix Fetters;  Surgeon: Pixie Casino, MD;  Location: [facility] CATH LAB;  Service: Cardiovascular;  Laterality: N/A;  . PERCUTANEOUS CORONARY ROTOBLATOR INTERVENTION (PCI-R)  09/18/2011   Procedure: PERCUTANEOUS CORONARY ROTOBLATOR INTERVENTION (PCI-R);  Surgeon: Troy Sine, MD;  Location: [facility] CATH LAB;  Service: Cardiovascular;;  . PERCUTANEOUS CORONARY STENT INTERVENTION (PCI-S) N/A 09/18/2011   Procedure: PERCUTANEOUS CORONARY STENT INTERVENTION (PCI-S);  Surgeon: Troy Sine, MD;  Location: [facility] CATH LAB;  Service: Cardiovascular;  Laterality: N/A;  . RIGHT/LEFT HEART  CATH AND CORONARY/GRAFT ANGIOGRAPHY N/A 07/30/2017   Procedure: RIGHT/LEFT HEART CATH AND CORONARY/GRAFT ANGIOGRAPHY;  Surgeon: Belva Crome, MD;  Location: [place] CV LAB;  Service: Cardiovascular;  Laterality: N/A;  . TEE WITHOUT CARDIOVERSION N/A 09/15/2017   Procedure: TRANSESOPHAGEAL ECHOCARDIOGRAM (TEE);  Surgeon: Burnell Blanks, MD;  Location: [place];  Service: Open Heart Surgery;  Laterality: N/A;  . TEMPORARY PACEMAKER INSERTION  09/18/2011   Procedure: TEMPORARY PACEMAKER INSERTION;  Surgeon: Troy Sine, MD;  Location: [facility] CATH LAB;  Service: Cardiovascular;;  . TRANSCATHETER AORTIC VALVE REPLACEMENT, TRANSFEMORAL N/A 09/15/2017   Procedure: TRANSCATHETER AORTIC VALVE REPLACEMENT, TRANSFEMORAL;  Surgeon: Burnell Blanks, MD;  Location: [place];  Service: Open Heart Surgery;  Laterality: N/A;    FAMHx:  Family History  Problem Relation Age of Onset  . Cancer Mother   . Heart disease Mother   . Stroke Mother   . Alcohol abuse Father   . Depression Father   . Alzheimer's disease Father   . Heart disease Brother        Heart bypass in 1996  . Cancer Brother        Prostate  . Cancer Sister      SOCHx:   reports that he has never smoked. He has never used smokeless tobacco. He reports that he does not drink alcohol or use drugs.  ALLERGIES:  Allergies  Allergen Reactions  . Atorvastatin Other (See Comments)    Muscle and joint pain, can tolerate rosuvastatin    ROS: Pertinent items noted in HPI and remainder of comprehensive ROS otherwise negative.  HOME MEDS: Current Outpatient Medications  Medication Sig Dispense Refill  . amLODipine (NORVASC) 10 MG tablet Take 1 tablet (10 mg total) by mouth at bedtime. 30 tablet 6  . amoxicillin (AMOXIL) 500 MG tablet Take 2 g (four 500 mg tablets) 1 hour prior to dental procedures. 4 tablet 3  . aspirin EC 81 MG tablet Take 81 mg by mouth at bedtime.     . clobetasol (TEMOVATE) 0.05 % external solution Apply 1 application topically 2 (two) times daily as needed. For itchy/rash scalp  3  . clopidogrel (PLAVIX) 75 MG tablet TAKE 1 TABLET BY MOUTH DAILY 90 tablet 3  . fish oil-omega-3 fatty acids 1000 MG capsule Take 1 g by mouth 2 (two) times daily.     . halobetasol (ULTRAVATE) 0.05 % ointment Apply 1 application topically daily as needed. For skin rash.    . isosorbide mononitrate (IMDUR) 30 MG 24 hr tablet Take 30 mg by mouth 3 (three) times daily.    . metoprolol tartrate (LOPRESSOR) 50 MG tablet Take 25 mg by mouth 2 (two) times daily.    . niacin (NIASPAN) 500 MG CR tablet Take 500 mg by mouth at bedtime.    . nitroGLYCERIN (NITROSTAT) 0.4 MG SL tablet Place 1 tablet (0.4 mg total) under the tongue every 5 (five) minutes x 3 doses as needed. 25 tablet 3  . rosuvastatin (CRESTOR) 40 MG tablet Take 1 tablet (40 mg total) by mouth at bedtime. 30 tablet 0   No current facility-administered medications for this visit.    Facility-Administered Medications Ordered in Other Visits  Medication Dose Route Frequency Provider Last Rate Last Dose  . sodium chloride 0.9 % injection 3 mL  3 mL Intravenous PRN Loana Salvaggio, Nadean Corwin, MD         LABS/IMAGING: Results for orders placed or performed in visit on 01/05/18 (from the past 48 hour(s))  Lipid panel     Status: None   Collection Time: 01/05/18 10:04 AM  Result Value Ref Range   Cholesterol, Total 164 100 - 199 mg/dL   Triglycerides 125 0 - 149 mg/dL   HDL 63 >39 mg/dL   VLDL Cholesterol Cal 25 5 - 40 mg/dL   LDL Calculated 76 0 - 99 mg/dL   Chol/HDL Ratio 2.6 0.0 - 5.0 ratio    Comment:                                   T. Chol/HDL Ratio                                             Men  Women                               1/2 Avg.Risk  3.4    3.3                                   Avg.Risk  5.0    4.4                                2X Avg.Risk  9.6    7.1                                3X Avg.Risk 23.4   11.0    No results found.  VITALS: BP 136/80   Pulse (!) 51   Ht 5\' 9"  (1.753 m)   Wt 179 lb (81.2 kg)   BMI 26.43 kg/m   EXAM: General appearance: alert and no distress Neck: no carotid bruit and no JVD Lungs: clear to auscultation bilaterally Heart: regular rate and rhythm, S1, S2 normal and systolic murmur: systolic ejection 3/6, crescendo at 2nd right intercostal space Abdomen: soft, non-tender; bowel sounds normal; no masses,  no organomegaly Extremities: extremities normal, atraumatic, no cyanosis or edema Pulses: 2+ and symmetric Skin: Skin color, texture, turgor normal. No rashes or lesions Neurologic: Grossly normal Psych: Pleasant  EKG: Sinus bradycardia with PVCs at 51, LBBB-personally reviewed  ASSESSMENT: 1. Coronary artery disease with multiple recent PCI to the right coronary - low risk NST- EF 58% (2017) 2. Aortic stenosis status post TAVR (12/2017)-Edwards Sapien 3 (26 mm) 3. Frequent fusion beats-asymptomatic 4. History of bilateral carotid endarterectomies 5. Hypertension 6. Dyslipidemia 7. LBBB 8. Stage III chronic kidney disease  PLAN: 1.   [name] is doing very well after placement of a TAVR valve with improvement in  his symptoms.  He has had no further anginal symptoms.  He does have extensive stenting in the right coronary artery including an ostial stent which extends into the aorta.  Based on this disease, I would recommend lifelong dual antiplatelet therapy with aspirin and Plavix.  He is overdue for lipid testing and will arrange for that today.  Blood pressure is well controlled.  Plan follow-up with me in 6 months or sooner as necessary.  Pixie Casino, MD, [facility], [place]  [facility] Attending Cardiologist  Direct Dial: [phone]  Fax: [phone]  Website:  [email or web address] Iyer 01/06/2018, 3:42 PM

## 2018-02-10 ENCOUNTER — Encounter (INDEPENDENT_AMBULATORY_CARE_PROVIDER_SITE_OTHER): Payer: Medicare Other | Admitting: Ophthalmology

## 2018-02-10 DIAGNOSIS — I10 Essential (primary) hypertension: Secondary | ICD-10-CM

## 2018-02-10 DIAGNOSIS — H35033 Hypertensive retinopathy, bilateral: Secondary | ICD-10-CM

## 2018-02-10 DIAGNOSIS — H43813 Vitreous degeneration, bilateral: Secondary | ICD-10-CM

## 2018-02-10 DIAGNOSIS — H34831 Tributary (branch) retinal vein occlusion, right eye, with macular edema: Secondary | ICD-10-CM

## 2018-03-01 ENCOUNTER — Ambulatory Visit: Payer: Medicare Other | Admitting: Cardiovascular Disease

## 2018-03-06 ENCOUNTER — Other Ambulatory Visit: Payer: Self-pay | Admitting: Internal Medicine

## 2018-04-14 ENCOUNTER — Encounter (INDEPENDENT_AMBULATORY_CARE_PROVIDER_SITE_OTHER): Payer: Medicare Other | Admitting: Ophthalmology

## 2018-04-14 DIAGNOSIS — H43813 Vitreous degeneration, bilateral: Secondary | ICD-10-CM | POA: Diagnosis not present

## 2018-04-14 DIAGNOSIS — H35033 Hypertensive retinopathy, bilateral: Secondary | ICD-10-CM | POA: Diagnosis not present

## 2018-04-14 DIAGNOSIS — H34831 Tributary (branch) retinal vein occlusion, right eye, with macular edema: Secondary | ICD-10-CM | POA: Diagnosis not present

## 2018-04-14 DIAGNOSIS — I10 Essential (primary) hypertension: Secondary | ICD-10-CM

## 2018-05-17 ENCOUNTER — Other Ambulatory Visit: Payer: Self-pay

## 2018-05-17 DIAGNOSIS — C4492 Squamous cell carcinoma of skin, unspecified: Secondary | ICD-10-CM

## 2018-05-17 HISTORY — DX: Squamous cell carcinoma of skin, unspecified: C44.92

## 2018-05-19 ENCOUNTER — Telehealth: Payer: Self-pay | Admitting: Internal Medicine

## 2018-05-19 NOTE — Telephone Encounter (Signed)
Received records from [place]. On 05/19/18, Appt 07/05/18 @ 9:00AM. [place]

## 2018-06-09 ENCOUNTER — Encounter (INDEPENDENT_AMBULATORY_CARE_PROVIDER_SITE_OTHER): Payer: Medicare Other | Admitting: Ophthalmology

## 2018-06-09 DIAGNOSIS — H34831 Tributary (branch) retinal vein occlusion, right eye, with macular edema: Secondary | ICD-10-CM

## 2018-06-09 DIAGNOSIS — H35033 Hypertensive retinopathy, bilateral: Secondary | ICD-10-CM

## 2018-06-09 DIAGNOSIS — I10 Essential (primary) hypertension: Secondary | ICD-10-CM | POA: Diagnosis not present

## 2018-06-09 DIAGNOSIS — H43813 Vitreous degeneration, bilateral: Secondary | ICD-10-CM | POA: Diagnosis not present

## 2018-06-11 IMAGING — CT CT CTA ABD/PEL W/CM AND/OR W/O CM
2 of 13 series · 10 of 46 positions shown, 15 images · IV contrast (isovue)
Comparison: 11/27/2015 renal sonogram.  02/22/2007 chest CT.

CLINICAL DATA: 83-year-old male with severe symptomatic aortic
stenosis. Pre-TAVR evaluation.

EXAM:
CT ANGIOGRAPHY CHEST, ABDOMEN AND PELVIS
TECHNIQUE: Multidetector CT imaging through the chest, abdomen and pelvis was
performed using the standard protocol during bolus administration of
intravenous contrast. Multiplanar reconstructed images and MIPs were
obtained and reviewed to evaluate the vascular anatomy.
CONTRAST:  95 cc Isovue 370 IV.

[Series 13: ax thins · axial · 0.59mm/px · z∈[+816,+1360]mm · 8 of 681 slices shown, 13 images]
[im 69/681  soft-tissue]
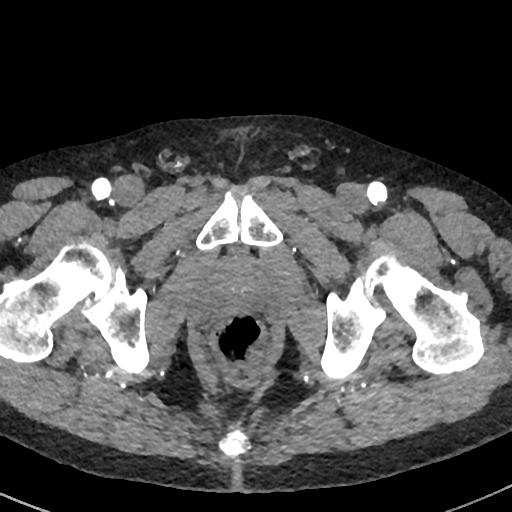
[im 69/681  bone]
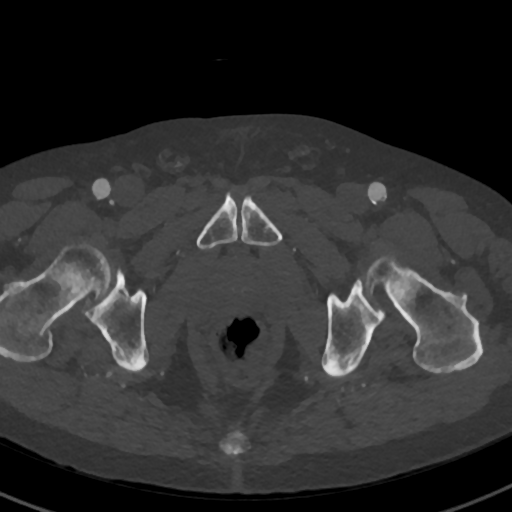
[im 137/681  soft-tissue]
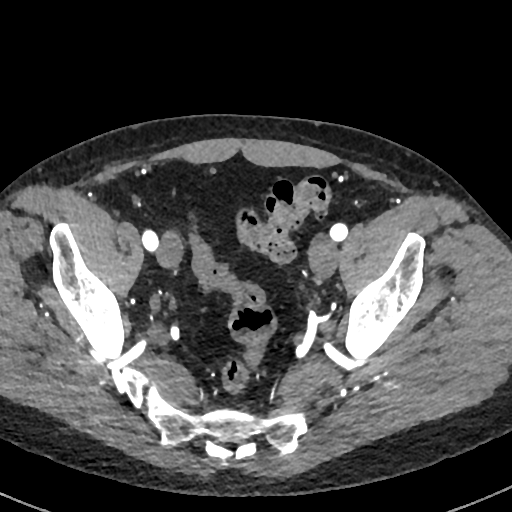
[im 205/681  soft-tissue]
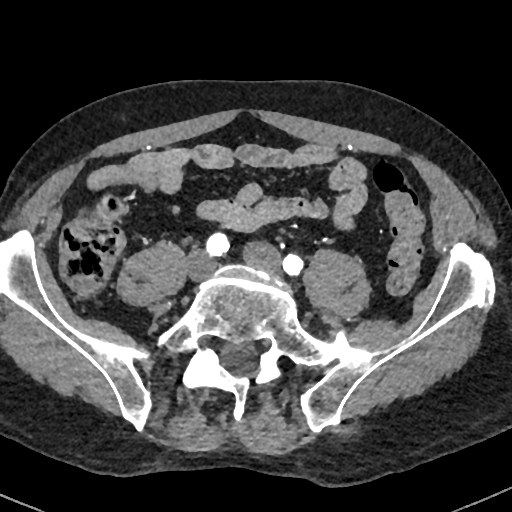
[im 273/681  soft-tissue]
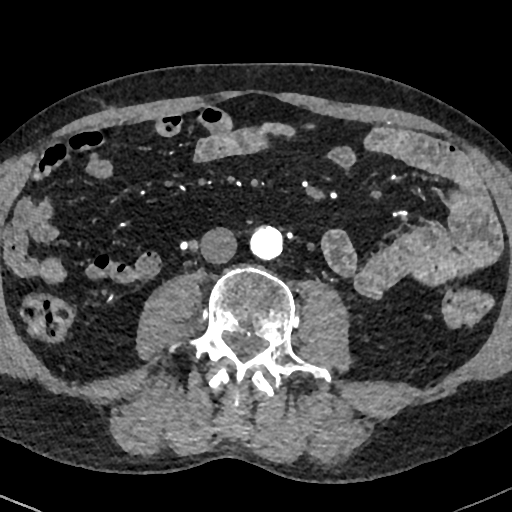
[im 409/681  soft-tissue]
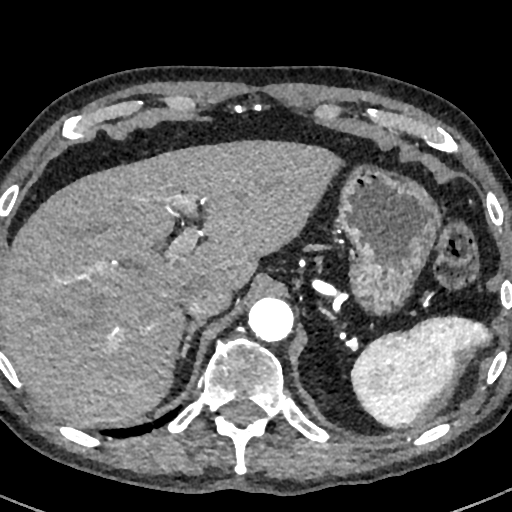
[im 409/681  lung]
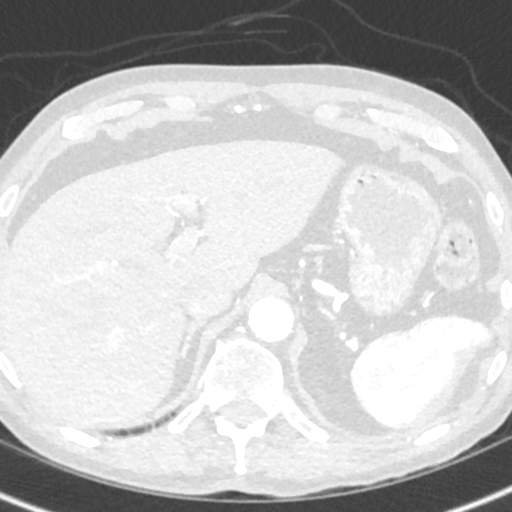
[im 477/681  soft-tissue]
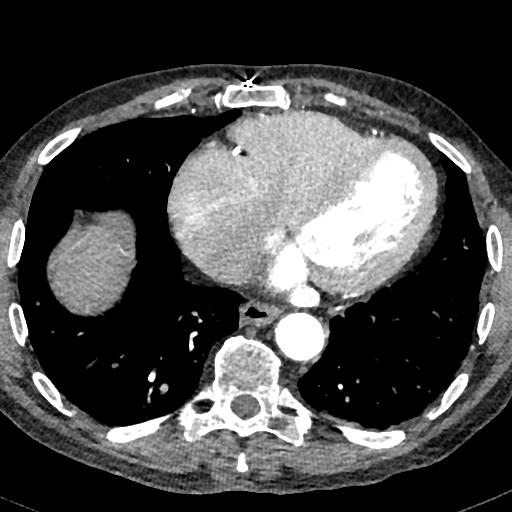
[im 477/681  lung]
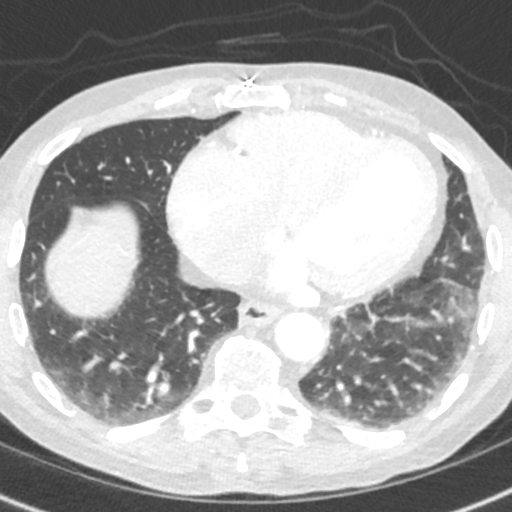
[im 545/681  soft-tissue]
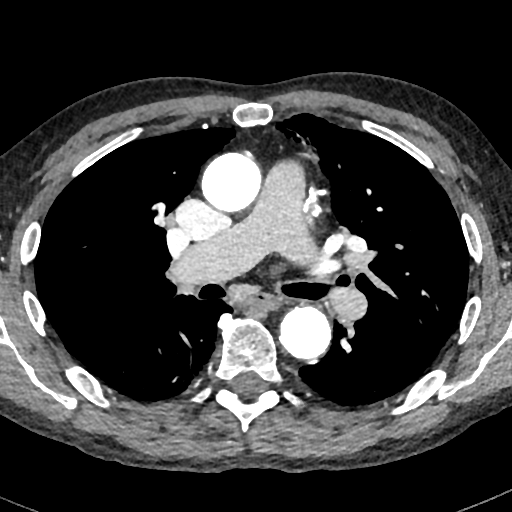
[im 545/681  lung]
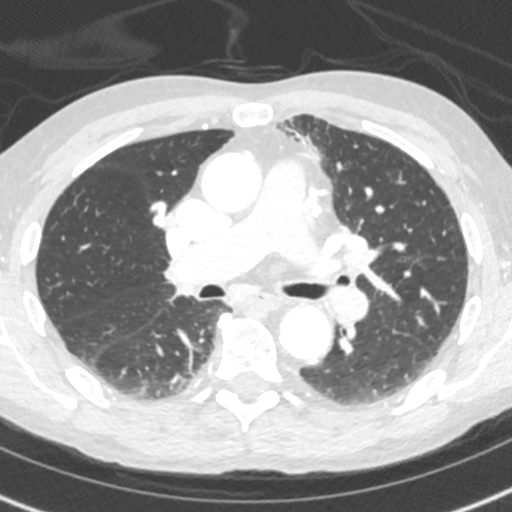
[im 613/681  soft-tissue]
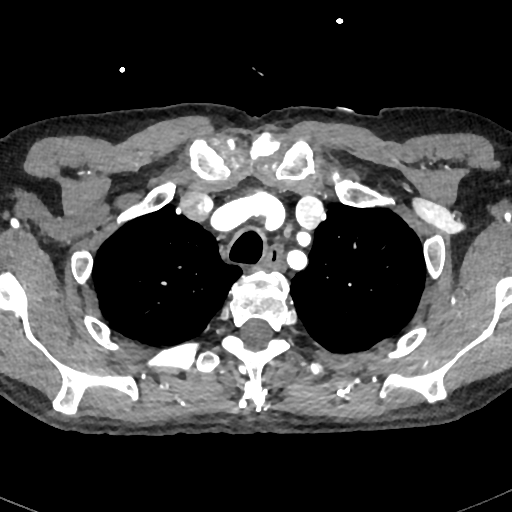
[im 613/681  lung]
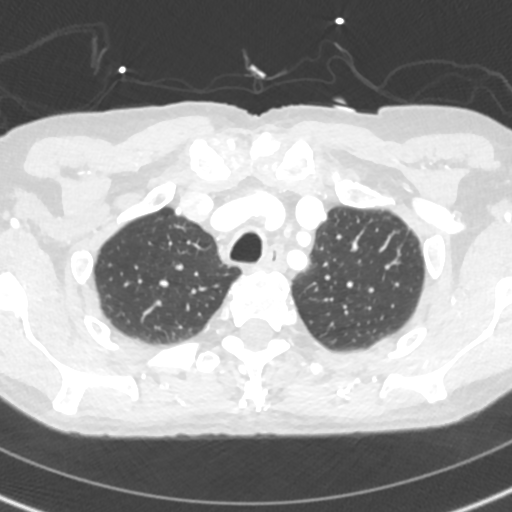

[Series 16: cor · coronal · 0.75mm/px · 2 of 141 slices shown]
[im 47/141  soft-tissue]
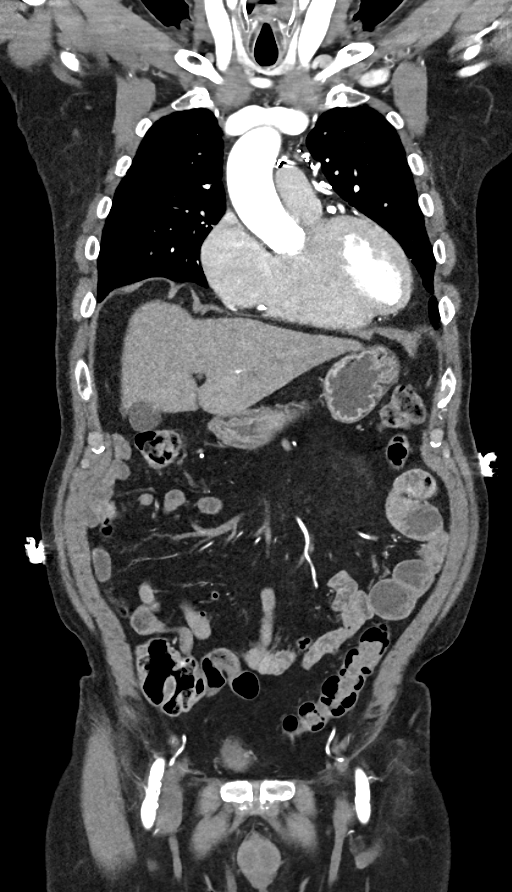
[im 94/141  soft-tissue]
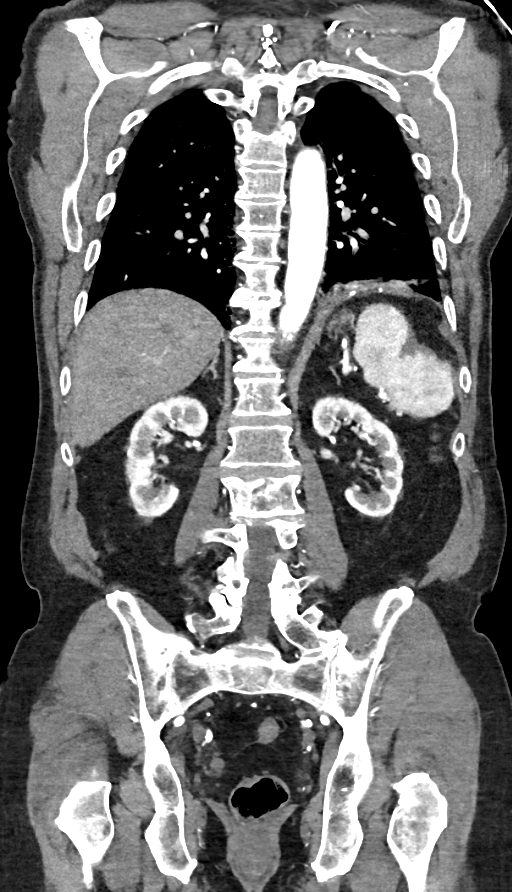

[10 of 46 positions shown; findings below may reference images not displayed]

FINDINGS: CTA CHEST FINDINGS

Cardiovascular: Mild cardiomegaly. No significant pericardial
fluid/thickening. Severe thickening and coarse calcification of the
aortic valve. Left main and 3 vessel coronary atherosclerosis status
post CABG. Atherosclerotic nonaneurysmal thoracic aorta. No evidence
of acute intramural hematoma, dissection, pseudoaneurysm or
penetrating atherosclerotic ulcer in the thoracic aorta. Thoracic
aortic branch vessels are patent. Normal caliber pulmonary arteries.
No central pulmonary emboli.

Mediastinum/Nodes: No discrete thyroid nodules. Unremarkable
esophagus. No axillary adenopathy. Mild right paratracheal
adenopathy measuring up to 1.1 cm (series 14/image 40), new. Mildly
enlarged 1.0 cm subcarinal node (series 14/image 46), new. No hilar
adenopathy.

Lungs/Pleura: No pneumothorax. No pleural effusion. No acute
consolidative airspace disease, lung masses or significant pulmonary
nodules. Mild hypoventilatory changes in the dependent lower lobes.

Musculoskeletal: No aggressive appearing focal osseous lesions.
Intact sternotomy wires. Mild to moderate thoracic spondylosis.

CTA ABDOMEN AND PELVIS FINDINGS

Hepatobiliary: Normal liver size. No liver masses. Normal
gallbladder with no radiopaque cholelithiasis. No biliary ductal
dilatation.

Pancreas: Normal, with no mass or duct dilation.

Spleen: Normal size spleen. No splenic mass. Small low-attenuation
peripheral subcapsular splenic collection measuring up to 1.0 cm
thickness with density 43 HU, most compatible with a subacute to
chronic subcapsular splenic hematoma.

Adrenals/Urinary Tract: Normal adrenals. No hydronephrosis. A few
scattered subcentimeter hypodense renal cortical lesions in both
kidneys are too small to characterize and require no follow-up.
Normal bladder.

Stomach/Bowel: Normal non-distended stomach. Normal caliber small
bowel with no small bowel wall thickening. Normal appendix. Mild
sigmoid diverticulosis, with no large bowel wall thickening or
pericolonic fat stranding.

Vascular/Lymphatic: Atherosclerotic nonaneurysmal abdominal aorta.
Patent splenic and renal veins. No pathologically enlarged lymph
nodes in the abdomen or pelvis.

Reproductive: Mildly enlarged prostate with nonspecific internal
prostatic calcifications.

Other: No pneumoperitoneum, ascites or focal fluid collection.

Musculoskeletal: No aggressive appearing focal osseous lesions.
Moderate lumbar spondylosis.

VASCULAR MEASUREMENTS PERTINENT TO TAVR:

AORTA:

Minimal Aortic Viameter-AU.L x 11.4 mm (infrarenal abdominal aorta)

Severity of Aortic Calcification-moderate to severe

RIGHT PELVIS:

Right Common Iliac Artery -

Minimal 2iameter-Z.9 x 7.5 mm

Tortuosity-mild

Calcification-moderate

Right External Iliac Artery -

Minimal Liameter-W.Q x 6.7 mm

Tortuosity-mild

Calcification-mild

Right Common Femoral Artery -

Minimal 8iameter-Q.J x 7.4 mm

Tortuosity-mild

Calcification-mild-to-moderate

LEFT PELVIS:

Left Common Iliac Artery -

Minimal Ziameter-A.F x 6.9 mm

Tortuosity-mild

Calcification-moderate to severe

Left External Iliac Artery -

Minimal Kiameter-U.0 x 8.4 mm

Tortuosity-mild

Calcification-none

Left Common Femoral Artery -

Minimal Biameter-B.V x 8.0 mm

Tortuosity-mild

Calcification-mild

Review of the MIP images confirms the above findings.
IMPRESSION: 1. Vascular findings and measurements pertinent to potential TAVR
procedure, as detailed above.
2. Severe thickening and calcification of the aortic valve,
compatible with the reported clinical history of severe aortic
stenosis.
3. Mild cardiomegaly. Left main and 3 vessel coronary
atherosclerosis status post CABG.
4. Nonspecific mild mediastinal lymphadenopathy, new since 1445
chest CT. No follow-up is required. This recommendation follows ACR
consensus guidelines: Managing Incidental Findings on Thoracic CT:
Mediastinal and Cardiovascular Findings. A White Paper of the ACR
Incidental Findings Committee. [HOSPITAL]. 6468; 15:
5. Small subacute to chronic subcapsular splenic hematoma.
6. Chronic findings include: Aortic Atherosclerosis (JHCKN-FT0.0).
Mild sigmoid diverticulosis. Mild prostatomegaly.

## 2018-07-05 ENCOUNTER — Encounter: Payer: Self-pay | Admitting: Internal Medicine

## 2018-07-05 ENCOUNTER — Ambulatory Visit (INDEPENDENT_AMBULATORY_CARE_PROVIDER_SITE_OTHER): Payer: Medicare Other | Admitting: Internal Medicine

## 2018-07-05 VITALS — BP 124/60 | HR 54 | Ht 69.5 in | Wt 184.0 lb

## 2018-07-05 DIAGNOSIS — I5032 Chronic diastolic (congestive) heart failure: Secondary | ICD-10-CM

## 2018-07-05 DIAGNOSIS — Z952 Presence of prosthetic heart valve: Secondary | ICD-10-CM | POA: Diagnosis not present

## 2018-07-05 DIAGNOSIS — E782 Mixed hyperlipidemia: Secondary | ICD-10-CM | POA: Diagnosis not present

## 2018-07-05 DIAGNOSIS — Z951 Presence of aortocoronary bypass graft: Secondary | ICD-10-CM | POA: Diagnosis not present

## 2018-07-05 NOTE — Patient Instructions (Signed)
Medication Instructions:  Continue current medications If you need a refill on your cardiac medications before your next appointment, please call your pharmacy.   Lab work: NONE   Testing/Procedures: NONE  Follow-Up: At [facility], you and your health needs are our priority.  As part of our continuing mission to provide you with exceptional heart care, we have created designated Provider Care Teams.  These Care Teams include your primary Cardiologist (physician) and Advanced Practice Providers (APPs -  Physician Assistants and Nurse Practitioners) who all work together to provide you with the care you need, when you need it. You will need a follow up appointment in 6 months.  Please call our office 2 months in advance to schedule this appointment.  You may see Kenneth C [name], MD or one of the following Advanced Practice Providers on your designated Care Team: Hao Meng, PA-C . Angela Duke, PA-C  Any Other Special Instructions Will Be Listed Below (If Applicable).    

## 2018-07-05 NOTE — Progress Notes (Signed)
OFFICE NOTE  Chief Complaint:  Follow-up dyspnea, TAVR  Primary Care Physician: Steven Pretty, MD  HPI:  [name] is a 84 year old gentleman who had unfortunate aggressive restenosis of an RCA lesion in the past, recently restented. He presented with worsening anginal symptoms and medical therapy was recommended in November. He was scheduled for a nuclear stress test which he underwent on July 21, 2012, and this was negative for ischemia. Recently, he had been experiencing some soreness in his left chest wall which has been going on and off for about a month. He is not experiencing the shortness of breath that he previously experienced with his angina, especially when walking up hills. He noted that he had an episode of a fall when he was trying to lift a very heavy deer, landed on his back and perhaps strained or sprained intercostal muscles or something in the chest wall. Based on his symptoms he underwent stress testing which indicated ischemia and he underwent a repeat coronary intervention to the RCA.   At his last office visit he was doing fairly well, without worsening shortness of breath or chest discomfort.  [place] did [place] but noted a small increase in shortness of breath with activity. He's also had weight gain recently and some lower extremity swelling. He says his symptoms are somewhat reminiscent of the symptoms that required prior coronary intervention about one year ago, but not as significant. Of note, he does have moderate to severe aortic stenosis which were touching very closely and a history of diastolic dysfunction plus or minus heart failure.  [name] and is feeling quite well. He denies any chest pain or shortness of breath. He told me of an episode recently where he helped deliver car and had to walk back home. Walked about 2 half miles during the middle of the day in the heat and had no problems or shortness of  breath or chest discomfort. He actually is feeling the best he has in some time.  [name] back in the office today. He continues to be active. He's been hunting recently and doing a lot of exertion without any worsening shortness of breath or chest pain. He recently had problems with some pain in his legs which he attributed to combination of Crestor and that he had. He discontinued his area and wean down his Crestor. He is currently getting 40 mg tablets and breaking them into thirds. He's taking one third of a 40 mg tablet are roughly 13 mg daily which she says is tolerable. His most recent lipid profile from September 2015 shows a total cholesterol 118, temperature 31, HDL 59 and LDL of 43 which is very low. He should be able to tolerate the decrease in his Crestor and still be at target numbers. He did have a lipoma profile and MR in March 2015. I believe there was an air in triglycerides which read over 700, but LDL-C was less than 100, LDL-P was 700 and generally showed a very favorable profile.  [name] in the office for follow-up. Overall he reports doing fairly well. He continues to be active during the summer denies any chest pain or worsening shortness of breath. He has follow-up of his carotid endarterectomies in November. We are following him for aortic stenosis and he does not seem to be symptomatic with that.  [name] back in the office today for follow-up. He  reports that he has developed again some exertional chest pain over the past few weeks. He feels like a soreness in the left chest wall, but it is also deep inside. Can come at rest, but worse with exertion. There is some exertional dyspnea as well. He has moderate AS by his most recent echo in 03/2015.   [name]. He underwent nuclear stress testing which was negative for ischemia. EF was normal at 58%. He reports his chest discomfort is gone away. He is now getting over a chest cold.  I suspect some of his pain was musculoskeletal.  04/14/2016  [name] was seen back today in the office for follow-up. He reports that he is doing very well denies any shortness of breath or chest pain. He is very physically active. In February he underwent nuclear stress testing which was negative for ischemia. We are following moderate aortic stenosis which was seen on his echo last year. He does have stage III chronic kidney disease as well. He's followed by vein and vascular surgery for bilateral carotid endarterectomies. Blood pressure is well-controlled today. Recent lipid profile was within normal limits and is followed by Dr. [name].  10/13/2016  [name] was seen again in follow-up today. He reports feeling "scaringly well". He says he has not felt this well on long time. 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 He denies any chest pain or worsening shortness of breath. He did have an echocardiogram which showed moderate aortic stenosis. Continue to follow that as it is likely to progress despite adequate control of blood pressure and cholesterol. He also has carotid artery disease and is followed by vein and vascular surgery.  04/16/2017  [name]. He denies any new chest pain or worsening shortness of breath. His blood pressures been well controlled. Where continue to follow his aortic stenosis and he has an upcoming echo in September. He recently changed primary care providers to Dr. [name], since Dr. [name] has retired. He denies any anginal chest pain.  07/17/2017  [name] was seen today in follow-up.  He says over the past several months he has had some increasing shortness of breath, chest pain and fatigue.  In September he had a repeat echo which showed a stable aortic valve gradient and moderate aortic stenosis.  He has a history of recurrent coronary artery disease  requiring multiple coronary interventions.  Up until recently he has done well without anginal symptoms.  He is noted recently however while hunting or doing other activities requiring exertion, that has had more frequent chest pain, shortness of breath and some fatigue.  He his primary care provider increased his isosorbide up to 90 mg twice daily.  Despite this change is not noted an improvement in his symptoms.  01/05/2018  Mr. Seddon returns today for follow-up.  He recently called in the office and was reporting significant dyspnea.  This started one morning however by the afternoon had totally resolved.  He has had no further symptoms.  Recently I referred him for evaluation of aortic stenosis and ultimately he was determined to be a candidate for TAVR.  He underwent TAVR on 09/15/2017 with Dr. Angelena Form and Dr. Cyndia Bent.  He had placement of an Edwards Sapien 3 valve (26 mm).  Other than the one episode of shortness of breath, which was reevaluated by a limited echo that  showed normal valve gradients and stable postprocedural valve, he has done very well with marked improvement in his symptoms including improvement in exercise tolerance.  He has not had significant follow-up with his primary care provider since Dr. [name] retired.  He has not had additional lipid testing.  03/04/2018  Mr. Wainer is seen today in follow-up.  He says that he has had some dyspnea but only with marked exertion.  He denies any chest pain.  He is done clinically very well after TAVR in January 2019.  He had an Edwards Sapien valve and repeat echoes have shown low gradients.  He has good energy level.  He did have repeat lipid testing recently from his PCP which showed an improved total cholesterol 158, HDL 48, LDL 65 and triglycerides of 227.  Given his persistently elevated triglycerides and coronary disease he may also be candidate to add Vascepa.  We discussed the importance of low saturated fat diet.  He also has a history of PAD  and has had bilateral carotid endarterectomies.  PMHx:  Past Medical History:  Diagnosis Date  . CAD (coronary artery disease)    a. s/p CABG in 1996 and subsequent PCI  . Carotid artery disease    a. Rt. CEA, 08/04/07, Lt. CEA 04/26/07.  . Chronic kidney disease   . CKD (chronic kidney disease) stage 2, GFR 60-89 ml/min 09/16/2011  . Coronary artery disease   . History of CVA (cerebrovascular accident)    a. 2008  . HTN (hypertension),uncontrolled 09/16/2011  . Hypertension   . LBBB (left bundle branch block)   . Peripheral arterial disease ([place])   . Pulmonary hypertension, mild 09/17/2011  . S/P TAVR (transcatheter aortic valve replacement)    a. 09/15/17: s/p Edwards Sapien 3 THV (size 26 mm, model # 9600TFX, serial # [id number])    Past Surgical History:  Procedure Laterality Date  .  Fluid knee Right Sept. 6, 2014   Fluid removed  . CARDIAC CATHETERIZATION Left 06/10/2012   Ostial RCA 99% stenosis pre-dilation 3x38mm Angiosculpt PTCA balloon, 3.5x80mm Promus Element DES overlapping proximal stent and into the Aorto-ostium, post-dilated with4x57mm [place] Trek balloon - 99% stenosis reduced to 0%  . CARDIAC CATHETERIZATION Left 12/22/2011   Left Main-diffuse tapering 50-60% stenosis; mild-moderate diseaseof the right ventricle branch of the right coronary artery; will plan FFR of ostial RCA  . CARDIAC CATHETERIZATION Bilateral 09/16/2011   Severe 90% ostial RCA disease which is calcified, will likely need PCI to ostial RCA with calcification  . CARDIAC CATHETERIZATION  09/18/2011   Calcified ostial 90-95% proximal RCA stenosis, 3x81mm Emerge balloon predilation, 3x51mm DES PROMUS Element stent was placed, post stent dilation done utilizing a 3.25x64mm noncompliant Quantum Apex balloon, 90-95% stenosis reduced to 0%  . CEA  2008  . CORONARY ANGIOPLASTY    . CORONARY ARTERY BYPASS GRAFT  08/04/2007  . FRACTIONAL FLOW RESERVE WIRE  12/22/2011   Ostial RCA, FFR ratio-0.82, physiologically  significant, 3.25x64mm Flextome Cutting balloon deployed at ~3.81mm final estimated diameter  . FRACTIONAL FLOW RESERVE WIRE Right 12/22/2011   Procedure: FRACTIONAL FLOW RESERVE WIRE;  Surgeon: Pixie Casino, MD;  Location: [facility] CATH LAB;  Service: Cardiovascular;  Laterality: Right;  . LEFT AND RIGHT HEART CATHETERIZATION WITH CORONARY ANGIOGRAM Left 09/16/2011   Procedure: LEFT AND RIGHT HEART CATHETERIZATION WITH CORONARY ANGIOGRAM;  Surgeon: Pixie Casino, MD;  Location: [facility] CATH LAB;  Service: Cardiovascular;  Laterality: Left;  . LEFT HEART CATHETERIZATION WITH CORONARY ANGIOGRAM N/A 06/10/2012  Procedure: LEFT HEART CATHETERIZATION WITH CORONARY ANGIOGRAM;  Surgeon: Pixie Casino, MD;  Location: [facility] CATH LAB;  Service: Cardiovascular;  Laterality: N/A;  . LEFT HEART CATHETERIZATION WITH CORONARY/GRAFT ANGIOGRAM N/A 12/22/2011   Procedure: LEFT HEART CATHETERIZATION WITH Beatrix Fetters;  Surgeon: Pixie Casino, MD;  Location: [facility] CATH LAB;  Service: Cardiovascular;  Laterality: N/A;  . PERCUTANEOUS CORONARY ROTOBLATOR INTERVENTION (PCI-R)  09/18/2011   Procedure: PERCUTANEOUS CORONARY ROTOBLATOR INTERVENTION (PCI-R);  Surgeon: Troy Sine, MD;  Location: [facility] CATH LAB;  Service: Cardiovascular;;  . PERCUTANEOUS CORONARY STENT INTERVENTION (PCI-S) N/A 09/18/2011   Procedure: PERCUTANEOUS CORONARY STENT INTERVENTION (PCI-S);  Surgeon: Troy Sine, MD;  Location: [facility] CATH LAB;  Service: Cardiovascular;  Laterality: N/A;  . RIGHT/LEFT HEART CATH AND CORONARY/GRAFT ANGIOGRAPHY N/A 07/30/2017   Procedure: RIGHT/LEFT HEART CATH AND CORONARY/GRAFT ANGIOGRAPHY;  Surgeon: Belva Crome, MD;  Location: [place] CV LAB;  Service: Cardiovascular;  Laterality: N/A;  . TEE WITHOUT CARDIOVERSION N/A 09/15/2017   Procedure: TRANSESOPHAGEAL ECHOCARDIOGRAM (TEE);  Surgeon: Burnell Blanks, MD;  Location: [place];  Service: Open Heart Surgery;  Laterality: N/A;  . TEMPORARY PACEMAKER  INSERTION  09/18/2011   Procedure: TEMPORARY PACEMAKER INSERTION;  Surgeon: Troy Sine, MD;  Location: [facility] CATH LAB;  Service: Cardiovascular;;  . TRANSCATHETER AORTIC VALVE REPLACEMENT, TRANSFEMORAL N/A 09/15/2017   Procedure: TRANSCATHETER AORTIC VALVE REPLACEMENT, TRANSFEMORAL;  Surgeon: Burnell Blanks, MD;  Location: [place];  Service: Open Heart Surgery;  Laterality: N/A;    FAMHx:  Family History  Problem Relation Age of Onset  . Cancer Mother   . Heart disease Mother   . Stroke Mother   . Alcohol abuse Father   . Depression Father   . Alzheimer's disease Father   . Heart disease Brother        Heart bypass in 1996  . Cancer Brother        Prostate  . Cancer Sister     SOCHx:   reports that he has never smoked. He has never used smokeless tobacco. He reports that he does not drink alcohol or use drugs.  ALLERGIES:  Allergies  Allergen Reactions  . Atorvastatin Other (See Comments)    Muscle and joint pain, can tolerate rosuvastatin    ROS: Pertinent items noted in HPI and remainder of comprehensive ROS otherwise negative.  HOME MEDS: Current Outpatient Medications  Medication Sig Dispense Refill  . amLODipine (NORVASC) 10 MG tablet Take 1 tablet (10 mg total) by mouth at bedtime. 30 tablet 6  . amoxicillin (AMOXIL) 500 MG tablet Take 2 g (four 500 mg tablets) 1 hour prior to dental procedures. 4 tablet 3  . aspirin EC 81 MG tablet Take 81 mg by mouth at bedtime.     . clobetasol (TEMOVATE) 0.05 % external solution Apply 1 application topically 2 (two) times daily as needed. For itchy/rash scalp  3  . clopidogrel (PLAVIX) 75 MG tablet TAKE 1 TABLET BY MOUTH DAILY 90 tablet 3  . fish oil-omega-3 fatty acids 1000 MG capsule Take 1 g by mouth 2 (two) times daily.     . halobetasol (ULTRAVATE) 0.05 % ointment Apply 1 application topically daily as needed. For skin rash.    . isosorbide mononitrate (IMDUR) 30 MG 24 hr tablet Take 30 mg by mouth 3 (three) times  daily.    . metoprolol tartrate (LOPRESSOR) 50 MG tablet Take 25 mg by mouth 2 (two) times daily.    . niacin (NIASPAN) 500 MG  CR tablet Take 500 mg by mouth at bedtime.    . nitroGLYCERIN (NITROSTAT) 0.4 MG SL tablet Place 1 tablet (0.4 mg total) under the tongue every 5 (five) minutes x 3 doses as needed. 25 tablet 3  . rosuvastatin (CRESTOR) 40 MG tablet TAKE 1 TABLET BY MOUTH EVERYDAY AT BEDTIME 90 tablet 1   No current facility-administered medications for this visit.    Facility-Administered Medications Ordered in Other Visits  Medication Dose Route Frequency Provider Last Rate Last Dose  . sodium chloride 0.9 % injection 3 mL  3 mL Intravenous PRN , Nadean Corwin, MD        LABS/IMAGING: No results found for this or any previous visit (from the past 48 hour(s)). No results found.  VITALS: BP (!) 158/82   Pulse (!) 54   Ht 5' 9.5" (1.765 m)   Wt 184 lb (83.5 kg)   BMI 26.78 kg/m   EXAM: General appearance: alert and no distress Neck: no carotid bruit and no JVD Lungs: clear to auscultation bilaterally Heart: regular rate and rhythm, S1, S2 normal and systolic murmur: systolic ejection 3/6, crescendo at 2nd right intercostal space Abdomen: soft, non-tender; bowel sounds normal; no masses,  no organomegaly Extremities: extremities normal, atraumatic, no cyanosis or edema Pulses: 2+ and symmetric Skin: Skin color, texture, turgor normal. No rashes or lesions Neurologic: Grossly normal Psych: Pleasant  EKG: Sinus bradycardia with PVCs, LBBB at 54-personally reviewed  ASSESSMENT: 1. Coronary artery disease with multiple recent PCI to the right coronary - low risk NST- EF 58% (2017) 2. Aortic stenosis status post TAVR (12/2017)-Edwards Sapien 3 (26 mm) 3. Frequent fusion beats-asymptomatic 4. History of bilateral carotid endarterectomies 5. Hypertension 6. Dyslipidemia 7. LBBB 8. Stage III chronic kidney disease  PLAN: 1.   Mr. Hingle has done well after TAVR earlier  this year.  His low gradients are low.  He has had no recurrent anginal symptoms.  He gets short of breath but only with marked exertion's.  He also has PAD and history of bilateral carotid endarterectomy.  He continues with dyslipidemia and has an LDL which is controlled however triglycerides remain elevated.  He may be a good candidate to add Vascepa if we cannot make significant progress with his diet.  We will discuss this further when he comes back in 6 months.  Pixie Casino, MD, [facility], [place] Director of the Advanced Lipid Disorders &  Cardiovascular Risk Reduction Clinic Attending Cardiologist  Direct Dial: [phone]  Fax: [phone]  Website:  [email or web address]  07/05/2018, 9:25 AM

## 2018-07-09 ENCOUNTER — Telehealth: Payer: Self-pay

## 2018-07-09 NOTE — Telephone Encounter (Signed)
Dr. Debara Pickett, Can you weigh in on holding aspirin and Plavix for eyelid surgery.   Please route response to P CV DIV PREOP

## 2018-07-09 NOTE — Telephone Encounter (Signed)
   [facility] Pre-operative Risk Assessment    Request for surgical clearance:  1. What type of surgery is being performed? LEFT LOWER EYELID ECTROPION REPAIR   2. When is this surgery scheduled? 07-19-2018   3. What type of clearance is required (medical clearance vs. Pharmacy clearance to hold med vs. Both)? BOTH  4. Are there any medications that need to be held prior to surgery and how long? ASA PLAVIX   5. Practice name and name of physician performing surgery?  [facility]    6. What is your office phone number [phone]    7.   What is your office fax number [phone]  8.   Anesthesia type (None, local, MAC, general) ? NOT LISTED   Waylan Rocher 07/09/2018, 1:32 PM  _________________________________________________________________   (provider comments below)

## 2018-07-13 NOTE — Telephone Encounter (Signed)
Ok to hold aspirin 7 days and plavix 5 days before surgery -restart after.  Dr. Lemmie Evens

## 2018-07-13 NOTE — Telephone Encounter (Signed)
   Primary Cardiologist: Pixie Casino, MD  Chart reviewed as part of pre-operative protocol coverage.   84 yo male with hx of CAD s/p CABG and multiple PCI procedures since, carotid dz s/p bilateral CEAs, aortic stenosis s/p TAVR in 08/2017, prior CVA, hypertension, hyperlipidemia, CKD. Last seen by Dr. Debara Pickett 07/05/18.    RCRI: 6.6% Dr. Debara Pickett has ok'd holding ASA and Plavix.  Clearance was not provided during last OV.  This is a low risk procedure so he should be able to proceed.  However, his RCRI is 6.6%.  I have left a message for him to call back so that we can assess for any clinical change since his last visit.  Richardson Dopp, PA-C 07/13/2018, 3:05 PM

## 2018-07-14 NOTE — Telephone Encounter (Signed)
   Call back staff: Marland Kitchen Clearance letter has been faxed to the requesting surgeon. . Please contact the surgeon's office to ensure it has been received. . This phone note will be removed from the preop pool. . Please sign encounter when completed.  Richardson Dopp, PA-C    07/14/2018 4:40 PM

## 2018-07-14 NOTE — Telephone Encounter (Addendum)
Attempted to call patient again. At his home # I was instructed to call his cell #. The patient did not answer and there was no voicemail set up. I called his home # and spoke to his wife Bethena Roys - DPR on file).  She will get him to call back. Richardson Dopp, PA-C    07/14/2018 3:43 PM

## 2018-07-14 NOTE — Telephone Encounter (Signed)
   Primary Cardiologist: Pixie Casino, MD  Chart reviewed as part of pre-operative protocol coverage.   Spoke with patient.  He has occasional chest pain and has had this for years without change.  He is fairly active without significant limitations.  DASI:  4.86 METs.  Ok to proceed.  Patient can hold ASA and Plavix.  The patient notes he was told to just hold Plavix by the surgeon.  If the procedure can be done on ASA, that would be better. I have asked him to contact the surgeon.    Richardson Dopp, PA-C 07/14/2018, 4:23 PM

## 2018-08-04 ENCOUNTER — Encounter (INDEPENDENT_AMBULATORY_CARE_PROVIDER_SITE_OTHER): Payer: Medicare Other | Admitting: Ophthalmology

## 2018-08-04 DIAGNOSIS — H34831 Tributary (branch) retinal vein occlusion, right eye, with macular edema: Secondary | ICD-10-CM | POA: Diagnosis not present

## 2018-08-04 DIAGNOSIS — H35033 Hypertensive retinopathy, bilateral: Secondary | ICD-10-CM

## 2018-08-04 DIAGNOSIS — H43813 Vitreous degeneration, bilateral: Secondary | ICD-10-CM | POA: Diagnosis not present

## 2018-08-04 DIAGNOSIS — I10 Essential (primary) hypertension: Secondary | ICD-10-CM

## 2018-08-19 ENCOUNTER — Encounter: Payer: Self-pay | Admitting: Physician Assistant

## 2018-08-19 ENCOUNTER — Other Ambulatory Visit: Payer: Self-pay

## 2018-08-19 DIAGNOSIS — I350 Nonrheumatic aortic (valve) stenosis: Secondary | ICD-10-CM

## 2018-08-19 DIAGNOSIS — Z952 Presence of prosthetic heart valve: Secondary | ICD-10-CM

## 2018-09-15 ENCOUNTER — Encounter (INDEPENDENT_AMBULATORY_CARE_PROVIDER_SITE_OTHER): Payer: Medicare Other | Admitting: Ophthalmology

## 2018-09-15 DIAGNOSIS — H35033 Hypertensive retinopathy, bilateral: Secondary | ICD-10-CM | POA: Diagnosis not present

## 2018-09-15 DIAGNOSIS — H43813 Vitreous degeneration, bilateral: Secondary | ICD-10-CM | POA: Diagnosis not present

## 2018-09-15 DIAGNOSIS — H34831 Tributary (branch) retinal vein occlusion, right eye, with macular edema: Secondary | ICD-10-CM

## 2018-09-15 DIAGNOSIS — I10 Essential (primary) hypertension: Secondary | ICD-10-CM

## 2018-09-16 ENCOUNTER — Other Ambulatory Visit (HOSPITAL_COMMUNITY): Payer: Medicare Other

## 2018-09-16 ENCOUNTER — Ambulatory Visit: Payer: Medicare Other | Admitting: Physician Assistant

## 2018-09-23 ENCOUNTER — Ambulatory Visit (INDEPENDENT_AMBULATORY_CARE_PROVIDER_SITE_OTHER): Payer: Medicare Other | Admitting: Physician Assistant

## 2018-09-23 ENCOUNTER — Ambulatory Visit (HOSPITAL_COMMUNITY): Payer: Medicare Other | Attending: Cardiovascular Disease

## 2018-09-23 ENCOUNTER — Encounter: Payer: Self-pay | Admitting: *Deleted

## 2018-09-23 ENCOUNTER — Encounter: Payer: Self-pay | Admitting: Physician Assistant

## 2018-09-23 VITALS — BP 120/62 | HR 60 | Ht 69.5 in | Wt 183.4 lb

## 2018-09-23 DIAGNOSIS — Z952 Presence of prosthetic heart valve: Secondary | ICD-10-CM | POA: Insufficient documentation

## 2018-09-23 DIAGNOSIS — I350 Nonrheumatic aortic (valve) stenosis: Secondary | ICD-10-CM

## 2018-09-23 LAB — ECHOCARDIOGRAM COMPLETE
HEIGHTINCHES: 69.5 in
WEIGHTICAEL: 2934.4 [oz_av]

## 2018-09-23 NOTE — Progress Notes (Signed)
[facility]                                       Cardiology Office Note    Date:  09/23/2018   ID:  [name], DOB 1933-09-20, MRN [id number]  PCP:  Deland Pretty, MD  Cardiologist: Dr. Debara Pickett / Dr. Angelena Form & Dr. Cyndia Bent (TAVR)  CC: 1 month sp TAVR  History of Present Illness:  [name] is a 85 y.o. male with a history of  HTN, CAD s/p CABG (1996) and multiple re interventions on the RCA,LBBB,sinus bradycardia,carotid artery disease s/p bilateral CEAs, CKD stage III, previous CVA (2008) and severe AS s/p TAVR (09/15/17) who presents to clinic for follow up.   He underwent successful TAVR with a 29 mm Edwards Sapein THV via the R TF approach on 09/15/17.Postoperative echoshowed a normally functioning THV with trivial PVL; mean gradient 13 mmHg.He was discharged on ASA and plavix. Repeat echo at 1 month showed a normally functioning TAVR valve with trivial PVL; Mean gradient (S): 13 mm Hg. He remains on DAPT with aspirin and plavix given significant coronary disease with extensive stenting in the right coronary artery including an ostial stent which extends into the aorta.  He had return of shortness of breath and called into the office in 11/2017. Dr. Sallyanne Kuster (DOD) recommended repeat echo which showed stable valve function and gradients.  Today he presents to clinic for follow up. He still gets some mild shortness of breath with moderate exertion. He is able to do all his activities including hunting and dragging a deer without issues. He occasionally gets CP with heavy exertion that is unchanged. No LE edema, orthopnea or PND. No dizziness or syncope. No blood in stool or urine. No palpitations.     Past Medical History:  Diagnosis Date  . CAD (coronary artery disease)    a. s/p CABG in 1996 and subsequent PCI  . Carotid artery disease    a. Rt. CEA, 08/04/07, Lt. CEA 04/26/07.  . Chronic kidney disease   . CKD (chronic  kidney disease) stage 2, GFR 60-89 ml/min 09/16/2011  . Coronary artery disease   . History of CVA (cerebrovascular accident)    a. 2008  . HTN (hypertension),uncontrolled 09/16/2011  . Hypertension   . LBBB (left bundle branch block)   . Peripheral arterial disease ([place])   . Pulmonary hypertension, mild 09/17/2011  . S/P TAVR (transcatheter aortic valve replacement)    a. 09/15/17: s/p Edwards Sapien 3 THV (size 26 mm, model # 9600TFX, serial # [id number])    Past Surgical History:  Procedure Laterality Date  .  Fluid knee Right Sept. 6, 2014   Fluid removed  . CARDIAC CATHETERIZATION Left 06/10/2012   Ostial RCA 99% stenosis pre-dilation 3x104mm Angiosculpt PTCA balloon, 3.5x14mm Promus Element DES overlapping proximal stent and into the Aorto-ostium, post-dilated with4x31mm [place] balloon - 99% stenosis reduced to 0%  . CARDIAC CATHETERIZATION Left 12/22/2011   Left Main-diffuse tapering 50-60% stenosis; mild-moderate diseaseof the right ventricle branch of the right coronary artery; will plan FFR of ostial RCA  . CARDIAC CATHETERIZATION Bilateral 09/16/2011   Severe 90% ostial RCA disease which is calcified, will likely need PCI to ostial RCA with calcification  . CARDIAC CATHETERIZATION  09/18/2011   Calcified ostial 90-95% proximal RCA stenosis, 3x14mm Emerge balloon predilation, 3x55mm DES PROMUS  Element stent was placed, post stent dilation done utilizing a 3.25x35mm noncompliant Quantum Apex balloon, 90-95% stenosis reduced to 0%  . CEA  2008  . CORONARY ANGIOPLASTY    . CORONARY ARTERY BYPASS GRAFT  08/04/2007  . FRACTIONAL FLOW RESERVE WIRE  12/22/2011   Ostial RCA, FFR ratio-0.82, physiologically significant, 3.25x65mm [place] balloon deployed at ~3.40mm final estimated diameter  . FRACTIONAL FLOW RESERVE WIRE Right 12/22/2011   Procedure: FRACTIONAL FLOW RESERVE WIRE;  Surgeon: Pixie Casino, MD;  Location: [facility] CATH LAB;  Service: Cardiovascular;  Laterality: Right;  . LEFT AND  RIGHT HEART CATHETERIZATION WITH CORONARY ANGIOGRAM Left 09/16/2011   Procedure: LEFT AND RIGHT HEART CATHETERIZATION WITH CORONARY ANGIOGRAM;  Surgeon: Pixie Casino, MD;  Location: [facility] CATH LAB;  Service: Cardiovascular;  Laterality: Left;  . LEFT HEART CATHETERIZATION WITH CORONARY ANGIOGRAM N/A 06/10/2012   Procedure: LEFT HEART CATHETERIZATION WITH CORONARY ANGIOGRAM;  Surgeon: Pixie Casino, MD;  Location: [facility] CATH LAB;  Service: Cardiovascular;  Laterality: N/A;  . LEFT HEART CATHETERIZATION WITH CORONARY/GRAFT ANGIOGRAM N/A 12/22/2011   Procedure: LEFT HEART CATHETERIZATION WITH Beatrix Fetters;  Surgeon: Pixie Casino, MD;  Location: [facility] CATH LAB;  Service: Cardiovascular;  Laterality: N/A;  . PERCUTANEOUS CORONARY ROTOBLATOR INTERVENTION (PCI-R)  09/18/2011   Procedure: PERCUTANEOUS CORONARY ROTOBLATOR INTERVENTION (PCI-R);  Surgeon: Troy Sine, MD;  Location: [facility] CATH LAB;  Service: Cardiovascular;;  . PERCUTANEOUS CORONARY STENT INTERVENTION (PCI-S) N/A 09/18/2011   Procedure: PERCUTANEOUS CORONARY STENT INTERVENTION (PCI-S);  Surgeon: Troy Sine, MD;  Location: [facility] CATH LAB;  Service: Cardiovascular;  Laterality: N/A;  . RIGHT/LEFT HEART CATH AND CORONARY/GRAFT ANGIOGRAPHY N/A 07/30/2017   Procedure: RIGHT/LEFT HEART CATH AND CORONARY/GRAFT ANGIOGRAPHY;  Surgeon: Belva Crome, MD;  Location: [place] CV LAB;  Service: Cardiovascular;  Laterality: N/A;  . TEE WITHOUT CARDIOVERSION N/A 09/15/2017   Procedure: TRANSESOPHAGEAL ECHOCARDIOGRAM (TEE);  Surgeon: Burnell Blanks, MD;  Location: [place];  Service: Open Heart Surgery;  Laterality: N/A;  . TEMPORARY PACEMAKER INSERTION  09/18/2011   Procedure: TEMPORARY PACEMAKER INSERTION;  Surgeon: Troy Sine, MD;  Location: [facility] CATH LAB;  Service: Cardiovascular;;  . TRANSCATHETER AORTIC VALVE REPLACEMENT, TRANSFEMORAL N/A 09/15/2017   Procedure: TRANSCATHETER AORTIC VALVE REPLACEMENT, TRANSFEMORAL;  Surgeon: Burnell Blanks, MD;  Location: [place];  Service: Open Heart Surgery;  Laterality: N/A;    Current Medications: Outpatient Medications Prior to Visit  Medication Sig Dispense Refill  . amLODipine (NORVASC) 10 MG tablet Take 1 tablet (10 mg total) by mouth at bedtime. 30 tablet 6  . amoxicillin (AMOXIL) 500 MG tablet Take 2 g (four 500 mg tablets) 1 hour prior to dental procedures. 4 tablet 3  . aspirin EC 81 MG tablet Take 81 mg by mouth at bedtime.     . clobetasol (TEMOVATE) 0.05 % external solution Apply 1 application topically 2 (two) times daily as needed. For itchy/rash scalp  3  . clopidogrel (PLAVIX) 75 MG tablet TAKE 1 TABLET BY MOUTH DAILY 90 tablet 3  . fish oil-omega-3 fatty acids 1000 MG capsule Take 1 g by mouth 2 (two) times daily.     . halobetasol (ULTRAVATE) 0.05 % ointment Apply 1 application topically daily as needed. For skin rash.    . isosorbide mononitrate (IMDUR) 30 MG 24 hr tablet Take 30 mg by mouth 3 (three) times daily.    . metoprolol tartrate (LOPRESSOR) 50 MG tablet Take 25 mg by mouth 2 (two) times daily.    Marland Kitchen  niacin (NIASPAN) 500 MG CR tablet Take 500 mg by mouth at bedtime.    . nitroGLYCERIN (NITROSTAT) 0.4 MG SL tablet Place 1 tablet (0.4 mg total) under the tongue every 5 (five) minutes x 3 doses as needed. 25 tablet 3  . rosuvastatin (CRESTOR) 40 MG tablet TAKE 1 TABLET BY MOUTH EVERYDAY AT BEDTIME 90 tablet 1   Facility-Administered Medications Prior to Visit  Medication Dose Route Frequency Provider Last Rate Last Dose  . sodium chloride 0.9 % injection 3 mL  3 mL Intravenous PRN [name], Nadean Corwin, MD         Allergies:   Atorvastatin   Social History   Socioeconomic History  . Marital status: Married    Spouse name: Not on file  . Number of children: Not on file  . Years of education: Not on file  . Highest education level: Not on file  Occupational History  . Not on file  Social Needs  . Financial resource strain: Not on file  . Food  insecurity:    Worry: Not on file    Inability: Not on file  . Transportation needs:    Medical: Not on file    Non-medical: Not on file  Tobacco Use  . Smoking status: Never Smoker  . Smokeless tobacco: Never Used  . Tobacco comment: rarely smoked when he did smoke   Substance and Sexual Activity  . Alcohol use: No    Alcohol/week: 0.0 standard drinks  . Drug use: No  . Sexual activity: Not Currently  Lifestyle  . Physical activity:    Days per week: Not on file    Minutes per session: Not on file  . Stress: Not on file  Relationships  . Social connections:    Talks on phone: Not on file    Gets together: Not on file    Attends religious service: Not on file    Active member of club or organization: Not on file    Attends meetings of clubs or organizations: Not on file    Relationship status: Not on file  Other Topics Concern  . Not on file  Social History Narrative  . Not on file     Family History:  The patient's family history includes Alcohol abuse in his father; Alzheimer's disease in his father; Cancer in his brother, mother, and sister; Depression in his father; Heart disease in his brother and mother; Stroke in his mother.      ROS:   Please see the history of present illness.    ROS All other systems reviewed and are negative.   PHYSICAL EXAM:   VS:  BP 120/62   Pulse 60   Ht 5' 9.5" (1.765 m)   Wt 183 lb 6.4 oz (83.2 kg)   SpO2 97%   BMI 26.70 kg/m    GEN: Well nourished, well developed, in no acute distress  HEENT: normal  Neck: no JVD, carotid bruits, or masses Cardiac: RRR; 2/6 SEM. No, rubs, or gallops,no edema  Respiratory:  clear to auscultation bilaterally, normal work of breathing GI: soft, nontender, nondistended, + BS MS: no deformity or atrophy  Skin: warm and dry, no rash Neuro:  Alert and Oriented x 3, Strength and sensation are intact Psych: euthymic mood, full affect   Wt Readings from Last 3 Encounters:  09/23/18 183 lb 6.4 oz  (83.2 kg)  07/05/18 184 lb (83.5 kg)  01/05/18 179 lb (81.2 kg)      Studies/Labs Reviewed:  EKG:  EKG is NOT ordered today.   Recent Labs: No results found for requested labs within last 8760 hours.   Lipid Panel    Component Value Date/Time   CHOL 164 01/05/2018 1004   TRIG 125 01/05/2018 1004   HDL 63 01/05/2018 1004   CHOLHDL 2.6 01/05/2018 1004   CHOLHDL 2.6 04/25/2007 0415   VLDL 25 04/25/2007 0415   LDLCALC 76 01/05/2018 1004    Additional studies/ records that were reviewed today include:  TAVR OPERATIVE NOTE   Date of Procedure:09/15/2017 Procedure:   Transcatheter Aortic Valve Replacement - PercutaneousRightTransfemoral Approach Edwards Sapien 3 THV (size 51mm, model # 9600TFX, serial # [id number])  Co-Surgeons:Bryan Alveria Apley, MD and Lauree Chandler, MD   Pre-operative Echo Findings: ? Severe aortic stenosis ? Normalleft ventricular systolic function  Post-operative Echo Findings: ? Noparavalvular leak ? Normalleft ventricular systolic function  _________________   Echo 10/07/2017 (1 month s/p TAVR) Study Conclusions - Left ventricle: The cavity size was normal. There was severe   focal basal and mild concentric hypertrophy. Systolic function   was normal. The estimated ejection fraction was in the range of   60% to 65%. Wall motion was normal; there were no regional wall   motion abnormalities. Features are consistent with a pseudonormal   left ventricular filling pattern, with concomitant abnormal   relaxation and increased filling pressure (grade 2 diastolic   dysfunction). Doppler parameters are consistent with high   ventricular filling pressure. - Aortic valve: S/P TAVR with 28mm Edwards Sapien bioprosthesis   that is normally functioning with trivial perivalvular AI Mean   gradient (S): 13 mm Hg. Valve area (VTI): 1.68 cm^2. Valve area   (Vmax): 1.64 cm^2. Valve  area (Vmean): 1.9 cm^2. - Mitral valve: Calcified annulus. There was mild regurgitation. - Left atrium: The atrium was mildly dilated.  _________________  Echo 09/23/2018 IMPRESSIONS    1. The left ventricle has normal systolic function of 31-54%. The cavity size was normal. There is mildly increased left ventricular wall thickness. Echo evidence of impaired diastolic relaxation Elevated left ventricular end-diastolic pressure.  2. The right ventricle has normal systolic function. The cavity was normal. There is no increase in right ventricular wall thickness.  3. The mitral valve is normal in structure.  4. The tricuspid valve is normal in structure.  5. 26 mm Edwards Sapien 3 TAVR bioprosthesis is present.  6. Compared with the echo 11/2017, mean TAVR gradients are unchanged.  7. The pulmonic valve was normal in structure.  Aortic Valve: 26 mm Edwards Sapien 3 TAVR bioprosthesis is present Aortic valve regurgitation was not visualized by color flow Doppler. Compared with the echo 11/2017, mean TAVR gradients are unchanged.    ASSESSMENT & PLAN:   Severe AS s/p TAVR: echo today shows normally functioning TAVR valve with mean gradient of 19 mm hg and no PVL . He has NYHA I class symptoms. SBE prophylaxis discussed; he has amoxicillin. ASA and Plavix will be continued long term given extensive CAD.    Medication Adjustments/Labs and Tests Ordered: Current medicines are reviewed at length with the patient today.  Concerns regarding medicines are outlined above.  Medication changes, Labs and Tests ordered today are listed in the Patient Instructions below. Patient Instructions  Medication Instructions:  No changes If you need a refill on your cardiac medications before your next appointment, please call your pharmacy.   Lab work: none If you have labs (blood work) drawn today and your tests are completely normal,  you will receive your results only by: Marland Kitchen MyChart Message (if you have  MyChart) OR . A paper copy in the mail If you have any lab test that is abnormal or we need to change your treatment, we will call you to review the results.  Testing/Procedures: Echo today as scheduled.  Follow-Up: At [facility], you and your health needs are our priority.  As part of our continuing mission to provide you with exceptional heart care, we have created designated Provider Care Teams.  These Care Teams include your primary Cardiologist (physician) and Advanced Practice Providers (APPs -  Physician Assistants and Nurse Practitioners) who all work together to provide you with the care you need, when you need it. You will need a follow up appointment in 3 months.  (In May, as previously planned). Please call our office 2 months in advance to schedule this appointment.  You may see Pixie Casino, MD or one of the following Advanced Practice Providers on your designated Care Team: [place], [place] . Fabian Sharp, PA-C  Any Other Special Instructions Will Be Listed Below (If Applicable).       Signed, Angelena Form, PA-C  09/23/2018 9:40 PM    [facility] [address], [place], [place]  [address] Phone: [phone]; Fax: [phone]

## 2018-09-23 NOTE — Patient Instructions (Signed)
Medication Instructions:  No changes If you need a refill on your cardiac medications before your next appointment, please call your pharmacy.   Lab work: none If you have labs (blood work) drawn today and your tests are completely normal, you will receive your results only by: Marland Kitchen MyChart Message (if you have MyChart) OR . A paper copy in the mail If you have any lab test that is abnormal or we need to change your treatment, we will call you to review the results.  Testing/Procedures: Echo today as scheduled.  Follow-Up: At [facility], you and your health needs are our priority.  As part of our continuing mission to provide you with exceptional heart care, we have created designated Provider Care Teams.  These Care Teams include your primary Cardiologist (physician) and Advanced Practice Providers (APPs -  Physician Assistants and Nurse Practitioners) who all work together to provide you with the care you need, when you need it. You will need a follow up appointment in 3 months.  (In May, as previously planned). Please call our office 2 months in advance to schedule this appointment.  You may see Pixie Casino, MD or one of the following Advanced Practice Providers on your designated Care Team: [place], [place] . Fabian Sharp, PA-C  Any Other Special Instructions Will Be Listed Below (If Applicable).

## 2018-10-27 ENCOUNTER — Other Ambulatory Visit: Payer: Self-pay

## 2018-10-27 ENCOUNTER — Encounter (INDEPENDENT_AMBULATORY_CARE_PROVIDER_SITE_OTHER): Payer: Medicare Other | Admitting: Ophthalmology

## 2018-10-27 DIAGNOSIS — H35033 Hypertensive retinopathy, bilateral: Secondary | ICD-10-CM

## 2018-10-27 DIAGNOSIS — I10 Essential (primary) hypertension: Secondary | ICD-10-CM

## 2018-10-27 DIAGNOSIS — H43813 Vitreous degeneration, bilateral: Secondary | ICD-10-CM | POA: Diagnosis not present

## 2018-10-27 DIAGNOSIS — H34831 Tributary (branch) retinal vein occlusion, right eye, with macular edema: Secondary | ICD-10-CM | POA: Diagnosis not present

## 2018-10-30 ENCOUNTER — Other Ambulatory Visit: Payer: Self-pay | Admitting: Internal Medicine

## 2018-12-08 ENCOUNTER — Encounter (INDEPENDENT_AMBULATORY_CARE_PROVIDER_SITE_OTHER): Payer: Medicare Other | Admitting: Ophthalmology

## 2018-12-16 ENCOUNTER — Other Ambulatory Visit: Payer: Self-pay | Admitting: Internal Medicine

## 2018-12-17 ENCOUNTER — Telehealth: Payer: Self-pay

## 2018-12-17 NOTE — Telephone Encounter (Signed)
Called patient to discuss upcoming appointment on 12/20/18 at 8:15a.  Patient's wife, Bethena Roys, reported that he is currently out hunting. She will attempt to make contact with him and tell him to call the office.

## 2018-12-17 NOTE — Telephone Encounter (Signed)
Left message on voicemail for pt to call office to change office visit to video visit due to covid 19

## 2018-12-17 NOTE — Telephone Encounter (Signed)
New Message  Patient returning your call please call to setup for virtual visit patient has smart phone.

## 2018-12-20 ENCOUNTER — Telehealth: Payer: Medicare Other | Admitting: Internal Medicine

## 2019-01-12 ENCOUNTER — Encounter (INDEPENDENT_AMBULATORY_CARE_PROVIDER_SITE_OTHER): Payer: Medicare Other | Admitting: Ophthalmology

## 2019-02-02 ENCOUNTER — Telehealth: Payer: Self-pay | Admitting: Internal Medicine

## 2019-02-02 NOTE — Telephone Encounter (Signed)
I've already spoken with Dental Office

## 2019-02-02 NOTE — Telephone Encounter (Signed)
Call placed to [facility]ist, re: surgical clearance. Left a detailed message on their voicemail to call us back with the # of extractions and what type of anesthesia.

## 2019-02-02 NOTE — Telephone Encounter (Signed)
Covering staff please get below information:  How many tooth extraction and type of anesthesia?

## 2019-02-02 NOTE — Telephone Encounter (Signed)
Follow up       [place] Pre-operative Risk Assessment    Request for surgical clearance:  1. What type of surgery is being performed? Dental extraction   2. When is this surgery scheduled? TBD   3. What type of clearance is required (medical clearance vs. Pharmacy clearance to hold med vs. Both)? Both  4. Are there any medications that need to be held prior to surgery and how long?  Plavix  5. Practice name and name of physician performing surgery? [facility] Dr. Jasmine December    6. What is your office phone number [phone]   7.   What is your office fax number [phone]  8.   Anesthesia type (None, local, MAC, general) ? none   [name] 02/02/2019, 12:57 PM  _________________________________________________________________   (provider comments below)

## 2019-02-02 NOTE — Telephone Encounter (Signed)
New Message   1. What dental office are you calling from? [facility]  2. What is your office phone number? [phone]   3. What is your fax number?   4. What type of procedure is the patient having performed? extraction  5. What date is procedure scheduled or is the patient there now? yes (if the patient is at the dentist's office question goes to their cardiologist if he/she is in the office.  If not, question should go to the DOD).   6. What is your question (ex. Antibiotics prior to procedure, holding medication-we need to know how long dentist wants pt to hold med)? Can the patient have an extraction today, does he need to be pre-med

## 2019-02-03 NOTE — Telephone Encounter (Addendum)
Called requesting office and they stated that they have not received clearance for pt. Pt is having one extraction and no anesthesia is required.

## 2019-02-04 NOTE — Telephone Encounter (Signed)
   Primary Cardiologist: Pixie Casino, MD  Chart reviewed as part of pre-operative protocol coverage. Simple dental extractions are considered low risk procedures per guidelines and generally do not require any specific cardiac clearance. It is also generally accepted that for simple extractions and dental cleanings, there is no need to interrupt blood thinner therapy.   He has hx of TAVR 08/2017.  SBE prophylaxis is required for the patient. He has been provided prescription by our valve clinic.  Apparently Dr. Gwenlyn Found spoke to the dental office but they are requesting documented clearance. I will route this to Dr. Gwenlyn Found to see if he has different recommendations.   Once I hear from Dr. Gwenlyn Found, I will route this recommendation to the requesting party via [place] fax function and remove from pre-op pool.  Please call with questions.  Daune Perch, NP 02/04/2019, 9:58 AM

## 2019-02-06 NOTE — Telephone Encounter (Signed)
Needs SBE prophylaxis. Cleared for dental procedure. OK to hold anti platelet meds if necessary

## 2019-02-07 NOTE — Telephone Encounter (Signed)
   Primary Cardiologist:Kenneth C [name], MD  Chart reviewed as part of pre-operative protocol coverage. Pre-op clearance already addressed by colleagues in earlier phone notes. To summarize recommendations:  -Simple dental extractions are considered low risk procedures per guidelines and generally do not require any specific cardiac clearance. It is also generally accepted that for simple extractions and dental cleanings, there is no need to interrupt blood thinner therapy. Per Dr. Gwenlyn Found, pt is cleared for dental procedure.   -He has hx of TAVR 08/2017. SBE prophylaxis is required for the patient. He has been provided prescription by our valve clinic.  -OK to hold antiplatelet meds if necessary.   Will route this bundled recommendation to requesting provider via Epic fax function. Please call with questions.  Daune Perch, NP 02/07/2019, 7:43 AM

## 2019-02-08 ENCOUNTER — Encounter (INDEPENDENT_AMBULATORY_CARE_PROVIDER_SITE_OTHER): Payer: Medicare Other | Admitting: Ophthalmology

## 2019-03-17 ENCOUNTER — Ambulatory Visit (INDEPENDENT_AMBULATORY_CARE_PROVIDER_SITE_OTHER): Payer: Medicare Other | Admitting: Internal Medicine

## 2019-03-17 ENCOUNTER — Other Ambulatory Visit: Payer: Self-pay

## 2019-03-17 ENCOUNTER — Encounter (INDEPENDENT_AMBULATORY_CARE_PROVIDER_SITE_OTHER): Payer: Self-pay

## 2019-03-17 ENCOUNTER — Encounter: Payer: Self-pay | Admitting: Internal Medicine

## 2019-03-17 VITALS — BP 128/68 | HR 60 | Temp 96.6°F | Ht 69.5 in | Wt 177.2 lb

## 2019-03-17 DIAGNOSIS — Z951 Presence of aortocoronary bypass graft: Secondary | ICD-10-CM

## 2019-03-17 DIAGNOSIS — R0602 Shortness of breath: Secondary | ICD-10-CM | POA: Diagnosis not present

## 2019-03-17 DIAGNOSIS — E782 Mixed hyperlipidemia: Secondary | ICD-10-CM | POA: Diagnosis not present

## 2019-03-17 DIAGNOSIS — R079 Chest pain, unspecified: Secondary | ICD-10-CM

## 2019-03-17 DIAGNOSIS — Z79899 Other long term (current) drug therapy: Secondary | ICD-10-CM

## 2019-03-17 NOTE — Progress Notes (Signed)
OFFICE NOTE  Chief Complaint:  Dyspnea, chest pain  Primary Care Physician: Deland Pretty, MD  HPI:  [name] is a 85 year old gentleman who had unfortunate aggressive restenosis of an RCA lesion in the past, recently restented. He presented with worsening anginal symptoms and medical therapy was recommended in November. He was scheduled for a nuclear stress test which he underwent on July 21, 2012, and this was negative for ischemia. Recently, he had been experiencing some soreness in his left chest wall which has been going on and off for about a month. He is not experiencing the shortness of breath that he previously experienced with his angina, especially when walking up hills. He noted that he had an episode of a fall when he was trying to lift a very heavy deer, landed on his [name] and perhaps strained or sprained intercostal muscles or something in the chest wall. Based on his symptoms he underwent stress testing which indicated ischemia and he underwent a repeat coronary intervention to the RCA.   At his last office visit he was doing fairly well, without worsening shortness of breath or chest discomfort.  She did [place] but noted a small increase in shortness of breath with activity. He's also had weight gain recently and some lower extremity swelling. He says his symptoms are somewhat reminiscent of the symptoms that required prior coronary intervention about one year ago, but not as significant. Of note, he does have moderate to severe aortic stenosis which were touching very closely and a history of diastolic dysfunction plus or minus heart failure.  [name] returns today and is feeling quite well. He denies any chest pain or shortness of breath. He told me of an episode recently where he helped deliver car and had to walk [name] home. Walked about 2 half miles during the middle of the day in the heat and had no problems or shortness of breath  or chest discomfort. He actually is feeling the best he has in some time.  [name] in the office today. He continues to be active. He's been hunting recently and doing a lot of exertion without any worsening shortness of breath or chest pain. He recently had problems with some pain in his legs which he attributed to combination of Crestor and that he had. He discontinued his area and wean down his Crestor. He is currently getting 40 mg tablets and breaking them into thirds. He's taking one third of a 40 mg tablet are roughly 13 mg daily which she says is tolerable. His most recent lipid profile from September 2015 shows a total cholesterol 118, temperature 31, HDL 59 and LDL of 43 which is very low. He should be able to tolerate the decrease in his Crestor and still be at target numbers. He did have a lipoma profile and MR in March 2015. I believe there was an air in triglycerides which read over 700, but LDL-C was less than 100, LDL-P was 700 and generally showed a very favorable profile.  [name] returns today in the office for follow-up. Overall he reports doing fairly well. He continues to be active during the summer denies any chest pain or worsening shortness of breath. He has follow-up of his carotid endarterectomies in November. We are following him for aortic stenosis and he does not seem to be symptomatic with that.  [name] [name] in the office today for follow-up. He  reports that he has developed again some exertional chest pain over the past few weeks. He feels like a soreness in the left chest wall, but it is also deep inside. Can come at rest, but worse with exertion. There is some exertional dyspnea as well. He has moderate AS by his most recent echo in 03/2015.   [name] returns today for follow-up. He underwent nuclear stress testing which was negative for ischemia. EF was normal at 58%. He reports his chest discomfort is gone away. He is now getting over a chest cold. I  suspect some of his pain was musculoskeletal.  04/14/2016  [name] was seen [name] today in the office for follow-up. He reports that he is doing very well denies any shortness of breath or chest pain. He is very physically active. In February he underwent nuclear stress testing which was negative for ischemia. We are following moderate aortic stenosis which was seen on his echo last year. He does have stage III chronic kidney disease as well. He's followed by vein and vascular surgery for bilateral carotid endarterectomies. Blood pressure is well-controlled today. Recent lipid profile was within normal limits and is followed by Dr. [name].  10/13/2016  [name] was seen again in follow-up today. He reports feeling "scaringly well". He says he has not felt this well on long time. 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 He denies any chest pain or worsening shortness of breath. He did have an echocardiogram which showed moderate aortic stenosis. Continue to follow that as it is likely to progress despite adequate control of blood pressure and cholesterol. He also has carotid artery disease and is followed by vein and vascular surgery.  04/16/2017  [name] returns today for follow-up. He denies any new chest pain or worsening shortness of breath. His blood pressures been well controlled. Where continue to follow his aortic stenosis and he has an upcoming echo in September. He recently changed primary care providers to Dr. [name], since Dr. [name] has retired. He denies any anginal chest pain.  07/17/2017  [name] was seen today in follow-up.  He says over the past several months he has had some increasing shortness of breath, chest pain and fatigue.  In September he had a repeat echo which showed a stable aortic valve gradient and moderate aortic stenosis.  He has a history of recurrent coronary artery disease requiring  multiple coronary interventions.  Up until recently he has done well without anginal symptoms.  He is noted recently however while hunting or doing other activities requiring exertion, that has had more frequent chest pain, shortness of breath and some fatigue.  He his primary care provider increased his isosorbide up to 90 mg twice daily.  Despite this change is not noted an improvement in his symptoms.  01/05/2018  [name] returns today for follow-up.  He recently called in the office and was reporting significant dyspnea.  This started one morning however by the afternoon had totally resolved.  He has had no further symptoms.  Recently I referred him for evaluation of aortic stenosis and ultimately he was determined to be a candidate for TAVR.  He underwent TAVR on 09/15/2017 with Dr. Angelena Form and Dr. Cyndia Bent.  He had placement of an Edwards Sapien 3 valve (26 mm).  Other than the one episode of shortness of breath, which was reevaluated by a limited echo that  showed normal valve gradients and stable postprocedural valve, he has done very well with marked improvement in his symptoms including improvement in exercise tolerance.  He has not had significant follow-up with his primary care provider since Dr. [name] retired.  He has not had additional lipid testing.  03/04/2018  [name] is seen today in follow-up.  He says that he has had some dyspnea but only with marked exertion.  He denies any chest pain.  He is done clinically very well after TAVR in January 2019.  He had an Edwards Sapien valve and repeat echoes have shown low gradients.  He has good energy level.  He did have repeat lipid testing recently from his PCP which showed an improved total cholesterol 158, HDL 48, LDL 65 and triglycerides of 227.  Given his persistently elevated triglycerides and coronary disease he may also be candidate to add Vascepa.  We discussed the importance of low saturated fat diet.  He also has a history of PAD and has  had bilateral carotid endarterectomies.  03/17/2019  [name] is seen today for in office follow-up.  Recently he is noted some progressive shortness of breath with exertion and chest discomfort.  This is somewhat reminiscent of symptoms he has had in the past.  This seems to be somewhat cyclical as he is had a number of episodes.  Fortunately last year he underwent TAVR and has had a good result with that.  He also has known coronary disease as previously mentioned with no real stenting option to the right coronary.  I have tried to manage that medically.  PMHx:  Past Medical History:  Diagnosis Date   CAD (coronary artery disease)    a. s/p CABG in 1996 and subsequent PCI   Carotid artery disease    a. Rt. CEA, 08/04/07, Lt. CEA 04/26/07.   Chronic kidney disease    CKD (chronic kidney disease) stage 2, GFR 60-89 ml/min 09/16/2011   Coronary artery disease    History of CVA (cerebrovascular accident)    a. 2008   HTN (hypertension),uncontrolled 09/16/2011   Hypertension    LBBB (left bundle branch block)    Peripheral arterial disease ([place])    Pulmonary hypertension, mild 09/17/2011   S/P TAVR (transcatheter aortic valve replacement)    a. 09/15/17: s/p Edwards Sapien 3 THV (size 26 mm, model # 9600TFX, serial # [id number])    Past Surgical History:  Procedure Laterality Date    Fluid knee Right Sept. 6, 2014   Fluid removed   CARDIAC CATHETERIZATION Left 06/10/2012   Ostial RCA 99% stenosis pre-dilation 3x43mm Angiosculpt PTCA balloon, 3.5x64mm Promus Element DES overlapping proximal stent and into the Aorto-ostium, post-dilated with4x32mm [place] Trek balloon - 99% stenosis reduced to 0%   CARDIAC CATHETERIZATION Left 12/22/2011   Left Main-diffuse tapering 50-60% stenosis; mild-moderate diseaseof the right ventricle branch of the right coronary artery; will plan FFR of ostial RCA   CARDIAC CATHETERIZATION Bilateral 09/16/2011   Severe 90% ostial RCA disease which is calcified,  will likely need PCI to ostial RCA with calcification   CARDIAC CATHETERIZATION  09/18/2011   Calcified ostial 90-95% proximal RCA stenosis, 3x32mm Emerge balloon predilation, 3x77mm DES PROMUS Element stent was placed, post stent dilation done utilizing a 3.25x62mm noncompliant Quantum Apex balloon, 90-95% stenosis reduced to 0%   CEA  2008   CORONARY ANGIOPLASTY     CORONARY ARTERY BYPASS GRAFT  08/04/2007   FRACTIONAL FLOW RESERVE WIRE  12/22/2011   Ostial RCA, FFR  ratio-0.82, physiologically significant, 3.25x76mm Flextome Cutting balloon deployed at ~3.56mm final estimated diameter   FRACTIONAL FLOW RESERVE WIRE Right 12/22/2011   Procedure: [place];  Surgeon: Pixie Casino, MD;  Location: [facility] CATH LAB;  Service: Cardiovascular;  Laterality: Right;   LEFT AND RIGHT HEART CATHETERIZATION WITH CORONARY ANGIOGRAM Left 09/16/2011   Procedure: LEFT AND RIGHT HEART CATHETERIZATION WITH CORONARY ANGIOGRAM;  Surgeon: Pixie Casino, MD;  Location: [facility] CATH LAB;  Service: Cardiovascular;  Laterality: Left;   LEFT HEART CATHETERIZATION WITH CORONARY ANGIOGRAM N/A 06/10/2012   Procedure: LEFT HEART CATHETERIZATION WITH CORONARY ANGIOGRAM;  Surgeon: Pixie Casino, MD;  Location: [facility] CATH LAB;  Service: Cardiovascular;  Laterality: N/A;   LEFT HEART CATHETERIZATION WITH CORONARY/GRAFT ANGIOGRAM N/A 12/22/2011   Procedure: LEFT HEART CATHETERIZATION WITH Beatrix Fetters;  Surgeon: Pixie Casino, MD;  Location: [facility] CATH LAB;  Service: Cardiovascular;  Laterality: N/A;   PERCUTANEOUS CORONARY ROTOBLATOR INTERVENTION (PCI-R)  09/18/2011   Procedure: PERCUTANEOUS CORONARY ROTOBLATOR INTERVENTION (PCI-R);  Surgeon: Troy Sine, MD;  Location: [facility] CATH LAB;  Service: Cardiovascular;;   PERCUTANEOUS CORONARY STENT INTERVENTION (PCI-S) N/A 09/18/2011   Procedure: PERCUTANEOUS CORONARY STENT INTERVENTION (PCI-S);  Surgeon: Troy Sine, MD;  Location: [facility] CATH LAB;  Service:  Cardiovascular;  Laterality: N/A;   RIGHT/LEFT HEART CATH AND CORONARY/GRAFT ANGIOGRAPHY N/A 07/30/2017   Procedure: RIGHT/LEFT HEART CATH AND CORONARY/GRAFT ANGIOGRAPHY;  Surgeon: Belva Crome, MD;  Location: [facility] CV LAB;  Service: Cardiovascular;  Laterality: N/A;   TEE WITHOUT CARDIOVERSION N/A 09/15/2017   Procedure: TRANSESOPHAGEAL ECHOCARDIOGRAM (TEE);  Surgeon: Burnell Blanks, MD;  Location: [place];  Service: Open Heart Surgery;  Laterality: N/A;   TEMPORARY PACEMAKER INSERTION  09/18/2011   Procedure: TEMPORARY PACEMAKER INSERTION;  Surgeon: Troy Sine, MD;  Location: [facility] CATH LAB;  Service: Cardiovascular;;   TRANSCATHETER AORTIC VALVE REPLACEMENT, TRANSFEMORAL N/A 09/15/2017   Procedure: TRANSCATHETER AORTIC VALVE REPLACEMENT, TRANSFEMORAL;  Surgeon: Burnell Blanks, MD;  Location: [place];  Service: Open Heart Surgery;  Laterality: N/A;    FAMHx:  Family History  Problem Relation Age of Onset   Cancer Mother    Heart disease Mother    Stroke Mother    Alcohol abuse Father    Depression Father    Alzheimer's disease Father    Heart disease Brother        Heart bypass in 22   Cancer Brother        Prostate   Cancer Sister     SOCHx:   reports that he has never smoked. He has never used smokeless tobacco. He reports that he does not drink alcohol or use drugs.  ALLERGIES:  Allergies  Allergen Reactions   Atorvastatin Other (See Comments)    Muscle and joint pain, can tolerate rosuvastatin    ROS: Pertinent items noted in HPI and remainder of comprehensive ROS otherwise negative.  HOME MEDS: Current Outpatient Medications  Medication Sig Dispense Refill   amLODipine (NORVASC) 10 MG tablet Take 1 tablet (10 mg total) by mouth at bedtime. 30 tablet 6   aspirin EC 81 MG tablet Take 81 mg by mouth at bedtime.      clobetasol (TEMOVATE) 0.05 % external solution Apply 1 application topically 2 (two) times daily as needed. For  itchy/rash scalp  3   clopidogrel (PLAVIX) 75 MG tablet TAKE 1 TABLET BY MOUTH DAILY 90 tablet 3   halobetasol (ULTRAVATE) 0.05 % ointment Apply 1 application topically daily  as needed. For skin rash.     isosorbide mononitrate (IMDUR) 30 MG 24 hr tablet Take 30 mg by mouth 3 (three) times daily.     metoprolol tartrate (LOPRESSOR) 50 MG tablet Take 25 mg by mouth 2 (two) times daily.     niacin (NIASPAN) 500 MG CR tablet Take 500 mg by mouth at bedtime.     nitroGLYCERIN (NITROSTAT) 0.4 MG SL tablet Place 1 tablet (0.4 mg total) under the tongue every 5 (five) minutes x 3 doses as needed. 25 tablet 3   rosuvastatin (CRESTOR) 40 MG tablet TAKE 1 TABLET BY MOUTH EVERYDAY AT BEDTIME 90 tablet 1   amoxicillin (AMOXIL) 500 MG tablet Take 2 g (four 500 mg tablets) 1 hour prior to dental procedures. (Patient not taking: Reported on 03/17/2019) 4 tablet 3   fish oil-omega-3 fatty acids 1000 MG capsule Take 1 g by mouth 2 (two) times daily.      No current facility-administered medications for this visit.    Facility-Administered Medications Ordered in Other Visits  Medication Dose Route Frequency Provider Last Rate Last Dose   sodium chloride 0.9 % injection 3 mL  3 mL Intravenous PRN Donevin Sainsbury, Nadean Corwin, MD        LABS/IMAGING: No results found for this or any previous visit (from the past 48 hour(s)). No results found.  VITALS: BP 128/68    Pulse 60    Temp (!) 96.6 F (35.9 C)    Ht 5' 9.5" (1.765 m)    Wt 177 lb 3.2 oz (80.4 kg)    SpO2 99%    BMI 25.79 kg/m   EXAM: General appearance: alert and no distress Neck: no carotid bruit and no JVD Lungs: clear to auscultation bilaterally Heart: regular rate and rhythm, S1, S2 normal and systolic murmur: systolic ejection 3/6, crescendo at 2nd right intercostal space Abdomen: soft, non-tender; bowel sounds normal; no masses,  no organomegaly Extremities: extremities normal, atraumatic, no cyanosis or edema Pulses: 2+ and symmetric Skin:  Skin color, texture, turgor normal. No rashes or lesions Neurologic: Grossly normal Psych: Pleasant  EKG: Sinus bradycardia with PVCs at 62, LBBB-personally reviewed  ASSESSMENT: 1. Coronary artery disease with multiple recent PCI to the right coronary - low risk NST- EF 58% (2017) 2. Aortic stenosis status post TAVR (12/2017)-Edwards Sapien 3 (26 mm) 3. Frequent fusion beats-asymptomatic 4. History of bilateral carotid endarterectomies 5. Hypertension 6. Dyslipidemia 7. LBBB 8. Stage III chronic kidney disease  PLAN: 1.   [name] had a good result with TAVR and has had stable echo findings as recently as February of this year.  EF is normal.  Unfortunately is now complaining of some exertional dyspnea and mild chest discomfort.  This could be related to his chronic right coronary artery disease.  He has had a number of stents in the ostium of the RCA and his limits active engagement of the vessel.  Also makes interventional options unlikely.  Given his procedures he is not likely to be a repeat bypass candidate.  I would favor medical therapy.  I like to get some updated lab work including a lipid profile, metabolic profile, CBC and BNP.  As he has stage III chronic kidney disease, this could limit antianginal therapy, particularly with Ranexa however I would like to consider adding this.  In addition he may be a candidate for Vascepa as he has had persistently elevated triglycerides.  Plan for follow-up with me in 2 to 3 months.  Nadean Corwin  Debara Pickett, MD, FACC, [place] Director of the [facility]  [facility] Attending Cardiologist  Direct Dial: [phone]   Fax: [phone]  Website:  [email or web address]  Nadean Corwin Junice Fei 03/17/2019, 8:28 AM

## 2019-03-17 NOTE — Patient Instructions (Signed)
Medication Instructions:  Your physician recommends that you continue on your current medications as directed. Please refer to the Current Medication list given to you today.  If you need a refill on your cardiac medications before your next appointment, please call your pharmacy.   Lab work: FASTING lab work to check cholesterol, metabolic panel, CBC, BNP If you have labs (blood work) drawn today and your tests are completely normal, you will receive your results only by: Marland Kitchen MyChart Message (if you have MyChart) OR . A paper copy in the mail If you have any lab test that is abnormal or we need to change your treatment, we will call you to review the results.  Testing/Procedures: NONE  Follow-Up: At [facility], you and your health needs are our priority.  As part of our continuing mission to provide you with exceptional heart care, we have created designated Provider Care Teams.  These Care Teams include your primary Cardiologist (physician) and Advanced Practice Providers (APPs -  Physician Assistants and Nurse Practitioners) who all work together to provide you with the care you need, when you need it. You will need a follow up appointment in 2 months.  You may see Pixie Casino, MD or one of the following Advanced Practice Providers on your designated Care Team: [place], [place] . Fabian Sharp, PA-C  Any Other Special Instructions Will Be Listed Below (If Applicable).

## 2019-03-18 LAB — COMPREHENSIVE METABOLIC PANEL
ALT: 18 IU/L (ref 0–44)
AST: 24 IU/L (ref 0–40)
Albumin/Globulin Ratio: 1.8 (ref 1.2–2.2)
Albumin: 4.2 g/dL (ref 3.6–4.6)
Alkaline Phosphatase: 114 IU/L (ref 39–117)
BUN/Creatinine Ratio: 17 (ref 10–24)
BUN: 29 mg/dL — ABNORMAL HIGH (ref 8–27)
Bilirubin Total: 0.4 mg/dL (ref 0.0–1.2)
CO2: 18 mmol/L — ABNORMAL LOW (ref 20–29)
Calcium: 9.1 mg/dL (ref 8.6–10.2)
Chloride: 108 mmol/L — ABNORMAL HIGH (ref 96–106)
Creatinine, Ser: 1.72 mg/dL — ABNORMAL HIGH (ref 0.76–1.27)
GFR calc Af Amer: 42 mL/min/{1.73_m2} — ABNORMAL LOW (ref >59)
GFR calc non Af Amer: 36 mL/min/{1.73_m2} — ABNORMAL LOW (ref >59)
Globulin, Total: 2.3 g/dL (ref 1.5–4.5)
Glucose: 87 mg/dL (ref 65–99)
Potassium: 5.3 mmol/L — ABNORMAL HIGH (ref 3.5–5.2)
Sodium: 140 mmol/L (ref 134–144)
Total Protein: 6.5 g/dL (ref 6.0–8.5)

## 2019-03-18 LAB — CBC
Hematocrit: 33.1 % — ABNORMAL LOW (ref 37.5–51.0)
Hemoglobin: 11 g/dL — ABNORMAL LOW (ref 13.0–17.7)
MCH: 29.8 pg (ref 26.6–33.0)
MCHC: 33.2 g/dL (ref 31.5–35.7)
MCV: 90 fL (ref 79–97)
Platelets: 135 10*3/uL — ABNORMAL LOW (ref 150–450)
RBC: 3.69 x10E6/uL — ABNORMAL LOW (ref 4.14–5.80)
RDW: 14.7 % (ref 11.6–15.4)
WBC: 5.3 10*3/uL (ref 3.4–10.8)

## 2019-03-18 LAB — PRO B NATRIURETIC PEPTIDE: NT-Pro BNP: 575 pg/mL — ABNORMAL HIGH (ref 0–486)

## 2019-03-18 LAB — LIPID PANEL
Chol/HDL Ratio: 3.5 ratio (ref 0.0–5.0)
Cholesterol, Total: 155 mg/dL (ref 100–199)
HDL: 44 mg/dL (ref >39)
LDL Calculated: 68 mg/dL (ref 0–99)
Triglycerides: 214 mg/dL — ABNORMAL HIGH (ref 0–149)
VLDL Cholesterol Cal: 43 mg/dL — ABNORMAL HIGH (ref 5–40)

## 2019-03-28 ENCOUNTER — Other Ambulatory Visit: Payer: Self-pay

## 2019-03-28 DIAGNOSIS — C439 Malignant melanoma of skin, unspecified: Secondary | ICD-10-CM

## 2019-03-28 DIAGNOSIS — C4492 Squamous cell carcinoma of skin, unspecified: Secondary | ICD-10-CM

## 2019-03-28 HISTORY — DX: Malignant melanoma of skin, unspecified: C43.9

## 2019-03-28 HISTORY — DX: Squamous cell carcinoma of skin, unspecified: C44.92

## 2019-03-28 MED ORDER — RANOLAZINE ER 500 MG PO TB12: 500.0000 mg | ORAL_TABLET | Freq: Two times a day (BID) | ORAL | 6 refills | Status: DC

## 2019-03-28 NOTE — Progress Notes (Signed)
Notes recorded by Pixie Casino, MD on 03/28/2019 at 11:14 AM EDT  Trigs elevated, will address at follow-up- creatinine 1.72. Would like to try low dose ranexa 500 mg BID now to see if this improves his chest pain. BNP essentially stable.   Dr Lemmie Evens

## 2019-06-02 ENCOUNTER — Ambulatory Visit: Payer: Medicare Other | Admitting: Internal Medicine

## 2019-06-13 ENCOUNTER — Other Ambulatory Visit: Payer: Self-pay

## 2019-06-13 DIAGNOSIS — I6523 Occlusion and stenosis of bilateral carotid arteries: Secondary | ICD-10-CM

## 2019-06-14 ENCOUNTER — Telehealth (HOSPITAL_COMMUNITY): Payer: Self-pay | Admitting: *Deleted

## 2019-06-14 ENCOUNTER — Encounter (INDEPENDENT_AMBULATORY_CARE_PROVIDER_SITE_OTHER): Payer: Self-pay

## 2019-06-14 NOTE — Telephone Encounter (Signed)

## 2019-06-15 ENCOUNTER — Ambulatory Visit (HOSPITAL_COMMUNITY)
Admission: RE | Admit: 2019-06-15 | Discharge: 2019-06-15 | Disposition: A | Discharge status: Home / Self Care | Payer: Medicare Other | Source: Ambulatory Visit | Attending: Family | Admitting: Family

## 2019-06-15 ENCOUNTER — Other Ambulatory Visit: Payer: Self-pay

## 2019-06-15 ENCOUNTER — Encounter: Payer: Self-pay | Admitting: Family

## 2019-06-15 ENCOUNTER — Ambulatory Visit (INDEPENDENT_AMBULATORY_CARE_PROVIDER_SITE_OTHER): Payer: Medicare Other | Admitting: Family

## 2019-06-15 VITALS — BP 148/72 | HR 49 | Temp 97.8°F | Resp 18 | Ht 65.5 in | Wt 174.0 lb

## 2019-06-15 DIAGNOSIS — I6523 Occlusion and stenosis of bilateral carotid arteries: Secondary | ICD-10-CM | POA: Diagnosis not present

## 2019-06-15 DIAGNOSIS — Z9889 Other specified postprocedural states: Secondary | ICD-10-CM | POA: Diagnosis not present

## 2019-06-15 NOTE — Progress Notes (Signed)
Virtual Visit via Telephone Note  I connected with [name] on 06/15/2019 using the [employer] by telephone and verified that I was speaking with the correct person using two identifiers. Patient was located at his home and accompanied by his wife and family. I am located at the [facility]V office/clinic.   The limitations of evaluation and management by telemedicine and the availability of in person appointments have been previously discussed with the patient and are documented in the patients chart. The patient expressed understanding and consented to proceed.  PCP: Deland Pretty, MD  Chief Complaint: follow up extracranial carotid artery stenosis   History of Present Illness: [name] is a 85 y.o. male who is status post staged bilateral CEAs in September and December 2008 by Dr. Trula Slade.   He has 2 cardiac stents, denies any history of an MI. His cardiologists are Dr. Debara Pickett and Angelena Form.He had a 3 vessel CABG in 1996. He had his aortic valve replaced with bovine tissue in January 2019.    He reports a TIA before Sept. 2008 as manifested by right arm weakness which has resolved, denies strokes or TIA symptoms since he had the CEA's. The patient denies any history of amaurosis fugax or monocular blindness or receptive or expressive aphasia.   He is quite physically active, denies claudication type symptoms with walking.  He denies chest pain or dyspnea. He denies fever or chills.   Diabetic: No Tobacco use: no  Pt meds include: Statin : Yes Betablocker: Yes ASA: yes Other anticoagulants/antiplatelets: Plavix  Past Medical History:  Diagnosis Date  . CAD (coronary artery disease)    a. s/p CABG in 1996 and subsequent PCI  . Carotid artery disease    a. Rt. CEA, 08/04/07, Lt. CEA 04/26/07.  . Chronic kidney disease   . CKD (chronic kidney disease) stage 2, GFR 60-89 ml/min 09/16/2011  . Coronary artery disease   . History of CVA (cerebrovascular accident)    a. 2008   . HTN (hypertension),uncontrolled 09/16/2011  . Hypertension   . LBBB (left bundle branch block)   . Peripheral arterial disease ([place])   . Pulmonary hypertension, mild 09/17/2011  . S/P TAVR (transcatheter aortic valve replacement)    a. 09/15/17: s/p Edwards Sapien 3 THV (size 26 mm, model # 9600TFX, serial # [id number])   Social History   Socioeconomic History  . Marital status: Married    Spouse name: Not on file  . Number of children: Not on file  . Years of education: Not on file  . Highest education level: Not on file  Occupational History  . Not on file  Social Needs  . Financial resource strain: Not on file  . Food insecurity    Worry: Not on file    Inability: Not on file  . Transportation needs    Medical: Not on file    Non-medical: Not on file  Tobacco Use  . Smoking status: Never Smoker  . Smokeless tobacco: Never Used  . Tobacco comment: rarely smoked when he did smoke   Substance and Sexual Activity  . Alcohol use: No    Alcohol/week: 0.0 standard drinks  . Drug use: No  . Sexual activity: Not Currently  Lifestyle  . Physical activity    Days per week: Not on file    Minutes per session: Not on file  . Stress: Not on file  Relationships  . Social connections    Talks on phone: Not on file  Gets together: Not on file    Attends religious service: Not on file    Active member of club or organization: Not on file    Attends meetings of clubs or organizations: Not on file    Relationship status: Not on file  . Intimate partner violence    Fear of current or ex partner: Not on file    Emotionally abused: Not on file    Physically abused: Not on file    Forced sexual activity: Not on file  Other Topics Concern  . Not on file  Social History Narrative  . Not on file    Past Surgical History:  Procedure Laterality Date  .  Fluid knee Right Sept. 6, 2014   Fluid removed  . CARDIAC CATHETERIZATION Left 06/10/2012   Ostial RCA 99% stenosis  pre-dilation 3x59mm Angiosculpt PTCA balloon, 3.5x63mm Promus Element DES overlapping proximal stent and into the Aorto-ostium, post-dilated with4x50mm [place] balloon - 99% stenosis reduced to 0%  . CARDIAC CATHETERIZATION Left 12/22/2011   Left Main-diffuse tapering 50-60% stenosis; mild-moderate diseaseof the right ventricle branch of the right coronary artery; will plan FFR of ostial RCA  . CARDIAC CATHETERIZATION Bilateral 09/16/2011   Severe 90% ostial RCA disease which is calcified, will likely need PCI to ostial RCA with calcification  . CARDIAC CATHETERIZATION  09/18/2011   Calcified ostial 90-95% proximal RCA stenosis, 3x74mm Emerge balloon predilation, 3x39mm DES PROMUS Element stent was placed, post stent dilation done utilizing a 3.25x49mm noncompliant Quantum Apex balloon, 90-95% stenosis reduced to 0%  . CEA  2008  . CORONARY ANGIOPLASTY    . CORONARY ARTERY BYPASS GRAFT  08/04/2007  . FRACTIONAL FLOW RESERVE WIRE  12/22/2011   Ostial RCA, FFR ratio-0.82, physiologically significant, 3.25x48mm Flextome Cutting balloon deployed at ~3.54mm final estimated diameter  . FRACTIONAL FLOW RESERVE WIRE Right 12/22/2011   Procedure: FRACTIONAL FLOW RESERVE WIRE;  Surgeon: Pixie Casino, MD;  Location: [facility] CATH LAB;  Service: Cardiovascular;  Laterality: Right;  . LEFT AND RIGHT HEART CATHETERIZATION WITH CORONARY ANGIOGRAM Left 09/16/2011   Procedure: LEFT AND RIGHT HEART CATHETERIZATION WITH CORONARY ANGIOGRAM;  Surgeon: Pixie Casino, MD;  Location: [facility] CATH LAB;  Service: Cardiovascular;  Laterality: Left;  . LEFT HEART CATHETERIZATION WITH CORONARY ANGIOGRAM N/A 06/10/2012   Procedure: LEFT HEART CATHETERIZATION WITH CORONARY ANGIOGRAM;  Surgeon: Pixie Casino, MD;  Location: [facility] CATH LAB;  Service: Cardiovascular;  Laterality: N/A;  . LEFT HEART CATHETERIZATION WITH CORONARY/GRAFT ANGIOGRAM N/A 12/22/2011   Procedure: LEFT HEART CATHETERIZATION WITH Beatrix Fetters;  Surgeon: Pixie Casino, MD;  Location: [facility] CATH LAB;  Service: Cardiovascular;  Laterality: N/A;  . PERCUTANEOUS CORONARY ROTOBLATOR INTERVENTION (PCI-R)  09/18/2011   Procedure: PERCUTANEOUS CORONARY ROTOBLATOR INTERVENTION (PCI-R);  Surgeon: Troy Sine, MD;  Location: [facility] CATH LAB;  Service: Cardiovascular;;  . PERCUTANEOUS CORONARY STENT INTERVENTION (PCI-S) N/A 09/18/2011   Procedure: PERCUTANEOUS CORONARY STENT INTERVENTION (PCI-S);  Surgeon: Troy Sine, MD;  Location: [facility] CATH LAB;  Service: Cardiovascular;  Laterality: N/A;  . RIGHT/LEFT HEART CATH AND CORONARY/GRAFT ANGIOGRAPHY N/A 07/30/2017   Procedure: RIGHT/LEFT HEART CATH AND CORONARY/GRAFT ANGIOGRAPHY;  Surgeon: Belva Crome, MD;  Location: [place] CV LAB;  Service: Cardiovascular;  Laterality: N/A;  . TEE WITHOUT CARDIOVERSION N/A 09/15/2017   Procedure: TRANSESOPHAGEAL ECHOCARDIOGRAM (TEE);  Surgeon: Burnell Blanks, MD;  Location: [place];  Service: Open Heart Surgery;  Laterality: N/A;  . TEMPORARY PACEMAKER INSERTION  09/18/2011   Procedure: TEMPORARY PACEMAKER  INSERTION;  Surgeon: Troy Sine, MD;  Location: [facility] CATH LAB;  Service: Cardiovascular;;  . TRANSCATHETER AORTIC VALVE REPLACEMENT, TRANSFEMORAL N/A 09/15/2017   Procedure: TRANSCATHETER AORTIC VALVE REPLACEMENT, TRANSFEMORAL;  Surgeon: Burnell Blanks, MD;  Location: [place];  Service: Open Heart Surgery;  Laterality: N/A;    Current Meds  Medication Sig  . amLODipine (NORVASC) 10 MG tablet Take 1 tablet (10 mg total) by mouth at bedtime.  Marland Kitchen aspirin EC 81 MG tablet Take 81 mg by mouth at bedtime.   . clobetasol (TEMOVATE) 0.05 % external solution Apply 1 application topically 2 (two) times daily as needed. For itchy/rash scalp  . clopidogrel (PLAVIX) 75 MG tablet TAKE 1 TABLET BY MOUTH DAILY  . fish oil-omega-3 fatty acids 1000 MG capsule Take 1 g by mouth 2 (two) times daily.   . halobetasol (ULTRAVATE) 0.05 % ointment Apply 1 application topically daily as  needed. For skin rash.  . isosorbide mononitrate (IMDUR) 30 MG 24 hr tablet Take 30 mg by mouth 3 (three) times daily.  . metoprolol tartrate (LOPRESSOR) 50 MG tablet Take 25 mg by mouth 2 (two) times daily.  . niacin (NIASPAN) 500 MG CR tablet Take 500 mg by mouth at bedtime.  . nitroGLYCERIN (NITROSTAT) 0.4 MG SL tablet Place 1 tablet (0.4 mg total) under the tongue every 5 (five) minutes x 3 doses as needed.  . ranolazine (RANEXA) 500 MG 12 hr tablet Take 1 tablet (500 mg total) by mouth 2 (two) times daily.  . rosuvastatin (CRESTOR) 40 MG tablet TAKE 1 TABLET BY MOUTH EVERYDAY AT BEDTIME    12 system ROS was negative unless otherwise noted in HPI   Observations/Objective:  DATA Carotid Duplex (06-15-19): Right Carotid: Velocities in the right ICA are consistent with a 1-39% stenosis. Left Carotid: Velocities in the left ICA are consistent with a 1-39% stenosis. Vertebrals:  Bilateral vertebral arteries demonstrate antegrade flow. Subclavians: Normal flow hemodynamics were seen in bilateral subclavian arteries.  Slight increased stenosis in the bilateral ICA compared to the exams on 06-25-15, 06-21-16, and 07-06-17.   Assessment and Plan: [name] is a 85 y.o. male who is status post staged bilateral CEAs in September and December 2008. He had a preoperative TIA in September of 2008, none subsequently.  Today's carotid duplex shows 1-39% bilateral ICA stenosis,slightly increased stenosis from the 3 previous exams dating back to November 2016, in which there was no ICA stenosis.   Fortunately he does not have DM and has never used tobacco. He takes a daily 81 mg ASA, Plavix, and a statin.    Follow Up Instructions:   Follow up in 1 year with carotid duplex.    I discussed the assessment and treatment plan with the patient. The patient was provided an opportunity to ask questions and all were answered. The patient agreed with the plan and demonstrated an understanding of the  instructions.   The patient was advised to call back or seek an in-person evaluation if the symptoms worsen or if the condition fails to improve as anticipated.  I spent 14 minutes with the patient via telephone encounter.   Gabrielle Dare [facility] [place] Office: [phone]  06/15/2019, 5:38 PM

## 2019-06-15 NOTE — Patient Instructions (Signed)

## 2019-07-22 ENCOUNTER — Other Ambulatory Visit: Payer: Self-pay

## 2019-07-22 ENCOUNTER — Ambulatory Visit: Payer: Medicare Other | Admitting: Internal Medicine

## 2019-07-22 VITALS — BP 127/70 | HR 53 | Ht 65.5 in | Wt 180.4 lb

## 2019-07-22 DIAGNOSIS — I5032 Chronic diastolic (congestive) heart failure: Secondary | ICD-10-CM | POA: Diagnosis not present

## 2019-07-22 DIAGNOSIS — Z951 Presence of aortocoronary bypass graft: Secondary | ICD-10-CM | POA: Diagnosis not present

## 2019-07-22 DIAGNOSIS — Z952 Presence of prosthetic heart valve: Secondary | ICD-10-CM

## 2019-07-22 DIAGNOSIS — R079 Chest pain, unspecified: Secondary | ICD-10-CM | POA: Diagnosis not present

## 2019-07-22 NOTE — Progress Notes (Signed)
OFFICE NOTE  Chief Complaint:  Follow-up chest pain  Primary Care Physician: Deland Pretty, MD  HPI:  JADRIEL SAXER is a 85 year old gentleman who had unfortunate aggressive restenosis of an RCA lesion in the past, recently restented. He presented with worsening anginal symptoms and medical therapy was recommended in November. He was scheduled for a nuclear stress test which he underwent on July 21, 2012, and this was negative for ischemia. Recently, he had been experiencing some soreness in his left chest wall which has been going on and off for about a month. He is not experiencing the shortness of breath that he previously experienced with his angina, especially when walking up hills. He noted that he had an episode of a fall when he was trying to lift a very heavy deer, landed on his back and perhaps strained or sprained intercostal muscles or something in the chest wall. Based on his symptoms he underwent stress testing which indicated ischemia and he underwent a repeat coronary intervention to the RCA.   At his last office visit he was doing fairly well, without worsening shortness of breath or chest discomfort.  [place] but noted a small increase in shortness of breath with activity. He's also had weight gain recently and some lower extremity swelling. He says his symptoms are somewhat reminiscent of the symptoms that required prior coronary intervention about one year ago, but not as significant. Of note, he does have moderate to severe aortic stenosis which were touching very closely and a history of diastolic dysfunction plus or minus heart failure.  Mr. Ospina returns today and is feeling quite well. He denies any chest pain or shortness of breath. He told me of an episode recently where he helped deliver car and had to walk back home. Walked about 2 half miles during the middle of the day in the heat and had no problems or shortness of breath  or chest discomfort. He actually is feeling the best he has in some time.  I saw Jaimen back in the office today. He continues to be active. He's been hunting recently and doing a lot of exertion without any worsening shortness of breath or chest pain. He recently had problems with some pain in his legs which he attributed to combination of Crestor and that he had. He discontinued his area and wean down his Crestor. He is currently getting 40 mg tablets and breaking them into thirds. He's taking one third of a 40 mg tablet are roughly 13 mg daily which she says is tolerable. His most recent lipid profile from September 2015 shows a total cholesterol 118, temperature 31, HDL 59 and LDL of 43 which is very low. He should be able to tolerate the decrease in his Crestor and still be at target numbers. He did have a lipoma profile and MR in March 2015. I believe there was an air in triglycerides which read over 700, but LDL-C was less than 100, LDL-P was 700 and generally showed a very favorable profile.  Mr. Birdwell returns today in the office for follow-up. Overall he reports doing fairly well. He continues to be active during the summer denies any chest pain or worsening shortness of breath. He has follow-up of his carotid endarterectomies in November. We are following him for aortic stenosis and he does not seem to be symptomatic with that.  I saw Mr. Galea back in the office today for follow-up. He  reports that he has developed again some exertional chest pain over the past few weeks. He feels like a soreness in the left chest wall, but it is also deep inside. Can come at rest, but worse with exertion. There is some exertional dyspnea as well. He has moderate AS by his most recent echo in 03/2015.   Mr. Fiumara returns today for follow-up. He underwent nuclear stress testing which was negative for ischemia. EF was normal at 58%. He reports his chest discomfort is gone away. He is now getting over a chest cold. I  suspect some of his pain was musculoskeletal.  04/14/2016  Mr. Spray was seen back today in the office for follow-up. He reports that he is doing very well denies any shortness of breath or chest pain. He is very physically active. In February he underwent nuclear stress testing which was negative for ischemia. We are following moderate aortic stenosis which was seen on his echo last year. He does have stage III chronic kidney disease as well. He's followed by vein and vascular surgery for bilateral carotid endarterectomies. Blood pressure is well-controlled today. Recent lipid profile was within normal limits and is followed by Dr. [name].  10/13/2016  Mr. Kester was seen again in follow-up today. He reports feeling "scaringly well". He says he has not felt this well on long time. 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 He denies any chest pain or worsening shortness of breath. He did have an echocardiogram which showed moderate aortic stenosis. Continue to follow that as it is likely to progress despite adequate control of blood pressure and cholesterol. He also has carotid artery disease and is followed by vein and vascular surgery.  04/16/2017  Mr. Longsworth returns today for follow-up. He denies any new chest pain or worsening shortness of breath. His blood pressures been well controlled. Where continue to follow his aortic stenosis and he has an upcoming echo in September. He recently changed primary care providers to Dr. [name], since Dr. [name] has retired. He denies any anginal chest pain.  07/17/2017  Mr. Hammitt was seen today in follow-up.  He says over the past several months he has had some increasing shortness of breath, chest pain and fatigue.  In September he had a repeat echo which showed a stable aortic valve gradient and moderate aortic stenosis.  He has a history of recurrent coronary artery disease requiring  multiple coronary interventions.  Up until recently he has done well without anginal symptoms.  He is noted recently however while hunting or doing other activities requiring exertion, that has had more frequent chest pain, shortness of breath and some fatigue.  He his primary care provider increased his isosorbide up to 90 mg twice daily.  Despite this change is not noted an improvement in his symptoms.  01/05/2018  Mr. Zahner returns today for follow-up.  He recently called in the office and was reporting significant dyspnea.  This started one morning however by the afternoon had totally resolved.  He has had no further symptoms.  Recently I referred him for evaluation of aortic stenosis and ultimately he was determined to be a candidate for TAVR.  He underwent TAVR on 09/15/2017 with Dr. Angelena Form and Dr. Cyndia Bent.  He had placement of an Edwards Sapien 3 valve (26 mm).  Other than the one episode of shortness of breath, which was reevaluated by a limited echo that  showed normal valve gradients and stable postprocedural valve, he has done very well with marked improvement in his symptoms including improvement in exercise tolerance.  He has not had significant follow-up with his primary care provider since Dr. [name] retired.  He has not had additional lipid testing.  03/04/2018  Mr. Broadnax is seen today in follow-up.  He says that he has had some dyspnea but only with marked exertion.  He denies any chest pain.  He is done clinically very well after TAVR in January 2019.  He had an Edwards Sapien valve and repeat echoes have shown low gradients.  He has good energy level.  He did have repeat lipid testing recently from his PCP which showed an improved total cholesterol 158, HDL 48, LDL 65 and triglycerides of 227.  Given his persistently elevated triglycerides and coronary disease he may also be candidate to add Vascepa.  We discussed the importance of low saturated fat diet.  He also has a history of PAD and has  had bilateral carotid endarterectomies.  03/17/2019  Mr. Woessner is seen today for in office follow-up.  Recently he is noted some progressive shortness of breath with exertion and chest discomfort.  This is somewhat reminiscent of symptoms he has had in the past.  This seems to be somewhat cyclical as he is had a number of episodes.  Fortunately last year he underwent TAVR and has had a good result with that.  He also has known coronary disease as previously mentioned with no real stenting option to the right coronary.  I have tried to manage that medically.  07/22/2019  Mr. Dotzler returns today for follow-up.  He reports his chest pain has resolved.  In fact he says that the Ranexa was probably the best medication I have ever given him.  He has had significant improvement in his quality of life and ability to do stuff without anginal symptoms.  This suggest that likely his right coronary artery disease is significant however as previously mentioned not amenable to PCI.  His TAVR valve is been stable.  Overall he feels well.  He has been active and continues to do hunting as well as a lot of house chores.  PMHx:  Past Medical History:  Diagnosis Date  . CAD (coronary artery disease)    a. s/p CABG in 1996 and subsequent PCI  . Carotid artery disease    a. Rt. CEA, 08/04/07, Lt. CEA 04/26/07.  . Chronic kidney disease   . CKD (chronic kidney disease) stage 2, GFR 60-89 ml/min 09/16/2011  . Coronary artery disease   . History of CVA (cerebrovascular accident)    a. 2008  . HTN (hypertension),uncontrolled 09/16/2011  . Hypertension   . LBBB (left bundle branch block)   . Peripheral arterial disease ([place])   . Pulmonary hypertension, mild 09/17/2011  . S/P TAVR (transcatheter aortic valve replacement)    a. 09/15/17: s/p Edwards Sapien 3 THV (size 26 mm, model # 9600TFX, serial # [id number])    Past Surgical History:  Procedure Laterality Date  .  Fluid knee Right Sept. 6, 2014   Fluid removed  .  CARDIAC CATHETERIZATION Left 06/10/2012   Ostial RCA 99% stenosis pre-dilation 3x44mm Angiosculpt PTCA balloon, 3.5x75mm Promus Element DES overlapping proximal stent and into the Aorto-ostium, post-dilated with4x1mm Clutier Trek balloon - 99% stenosis reduced to 0%  . CARDIAC CATHETERIZATION Left 12/22/2011   Left Main-diffuse tapering 50-60% stenosis; mild-moderate diseaseof the right ventricle branch of the right coronary artery;  will plan FFR of ostial RCA  . CARDIAC CATHETERIZATION Bilateral 09/16/2011   Severe 90% ostial RCA disease which is calcified, will likely need PCI to ostial RCA with calcification  . CARDIAC CATHETERIZATION  09/18/2011   Calcified ostial 90-95% proximal RCA stenosis, 3x51mm Emerge balloon predilation, 3x23mm DES PROMUS Element stent was placed, post stent dilation done utilizing a 3.25x80mm noncompliant Quantum Apex balloon, 90-95% stenosis reduced to 0%  . CEA  2008  . CORONARY ANGIOPLASTY    . CORONARY ARTERY BYPASS GRAFT  08/04/2007  . FRACTIONAL FLOW RESERVE WIRE  12/22/2011   Ostial RCA, FFR ratio-0.82, physiologically significant, 3.25x42mm Flextome Cutting balloon deployed at ~3.37mm final estimated diameter  . FRACTIONAL FLOW RESERVE WIRE Right 12/22/2011   Procedure: FRACTIONAL FLOW RESERVE WIRE;  Surgeon: Pixie Casino, MD;  Location: [facility] CATH LAB;  Service: Cardiovascular;  Laterality: Right;  . LEFT AND RIGHT HEART CATHETERIZATION WITH CORONARY ANGIOGRAM Left 09/16/2011   Procedure: LEFT AND RIGHT HEART CATHETERIZATION WITH CORONARY ANGIOGRAM;  Surgeon: Pixie Casino, MD;  Location: [facility] CATH LAB;  Service: Cardiovascular;  Laterality: Left;  . LEFT HEART CATHETERIZATION WITH CORONARY ANGIOGRAM N/A 06/10/2012   Procedure: LEFT HEART CATHETERIZATION WITH CORONARY ANGIOGRAM;  Surgeon: Pixie Casino, MD;  Location: [facility] CATH LAB;  Service: Cardiovascular;  Laterality: N/A;  . LEFT HEART CATHETERIZATION WITH CORONARY/GRAFT ANGIOGRAM N/A 12/22/2011   Procedure: LEFT HEART  CATHETERIZATION WITH Beatrix Fetters;  Surgeon: Pixie Casino, MD;  Location: [facility] CATH LAB;  Service: Cardiovascular;  Laterality: N/A;  . PERCUTANEOUS CORONARY ROTOBLATOR INTERVENTION (PCI-R)  09/18/2011   Procedure: PERCUTANEOUS CORONARY ROTOBLATOR INTERVENTION (PCI-R);  Surgeon: Troy Sine, MD;  Location: [facility] CATH LAB;  Service: Cardiovascular;;  . PERCUTANEOUS CORONARY STENT INTERVENTION (PCI-S) N/A 09/18/2011   Procedure: PERCUTANEOUS CORONARY STENT INTERVENTION (PCI-S);  Surgeon: Troy Sine, MD;  Location: [facility] CATH LAB;  Service: Cardiovascular;  Laterality: N/A;  . RIGHT/LEFT HEART CATH AND CORONARY/GRAFT ANGIOGRAPHY N/A 07/30/2017   Procedure: RIGHT/LEFT HEART CATH AND CORONARY/GRAFT ANGIOGRAPHY;  Surgeon: Belva Crome, MD;  Location: [place] CV LAB;  Service: Cardiovascular;  Laterality: N/A;  . TEE WITHOUT CARDIOVERSION N/A 09/15/2017   Procedure: TRANSESOPHAGEAL ECHOCARDIOGRAM (TEE);  Surgeon: Burnell Blanks, MD;  Location: [place];  Service: Open Heart Surgery;  Laterality: N/A;  . TEMPORARY PACEMAKER INSERTION  09/18/2011   Procedure: TEMPORARY PACEMAKER INSERTION;  Surgeon: Troy Sine, MD;  Location: [facility] CATH LAB;  Service: Cardiovascular;;  . TRANSCATHETER AORTIC VALVE REPLACEMENT, TRANSFEMORAL N/A 09/15/2017   Procedure: TRANSCATHETER AORTIC VALVE REPLACEMENT, TRANSFEMORAL;  Surgeon: Burnell Blanks, MD;  Location: [place];  Service: Open Heart Surgery;  Laterality: N/A;    FAMHx:  Family History  Problem Relation Age of Onset  . Cancer Mother   . Heart disease Mother   . Stroke Mother   . Alcohol abuse Father   . Depression Father   . Alzheimer's disease Father   . Heart disease Brother        Heart bypass in 1996  . Cancer Brother        Prostate  . Cancer Sister     SOCHx:   reports that he has never smoked. He has never used smokeless tobacco. He reports that he does not drink alcohol or use drugs.  ALLERGIES:  Allergies   Allergen Reactions  . Atorvastatin Other (See Comments)    Muscle and joint pain, can tolerate rosuvastatin    ROS: Pertinent items noted in HPI and remainder  of comprehensive ROS otherwise negative.  HOME MEDS: Current Outpatient Medications  Medication Sig Dispense Refill  . amLODipine (NORVASC) 10 MG tablet Take 1 tablet (10 mg total) by mouth at bedtime. 30 tablet 6  . amoxicillin (AMOXIL) 500 MG tablet Take 2 g (four 500 mg tablets) 1 hour prior to dental procedures. 4 tablet 3  . aspirin EC 81 MG tablet Take 81 mg by mouth at bedtime.     . clobetasol (TEMOVATE) 0.05 % external solution Apply 1 application topically 2 (two) times daily as needed. For itchy/rash scalp  3  . clopidogrel (PLAVIX) 75 MG tablet TAKE 1 TABLET BY MOUTH DAILY 90 tablet 3  . halobetasol (ULTRAVATE) 0.05 % ointment Apply 1 application topically daily as needed. For skin rash.    . isosorbide mononitrate (IMDUR) 30 MG 24 hr tablet Take 30 mg by mouth 3 (three) times daily.    . metoprolol tartrate (LOPRESSOR) 50 MG tablet Take 25 mg by mouth 2 (two) times daily.    . niacin (NIASPAN) 500 MG CR tablet Take 500 mg by mouth at bedtime.    . nitroGLYCERIN (NITROSTAT) 0.4 MG SL tablet Place 1 tablet (0.4 mg total) under the tongue every 5 (five) minutes x 3 doses as needed. 25 tablet 3  . ranolazine (RANEXA) 500 MG 12 hr tablet Take 1 tablet (500 mg total) by mouth 2 (two) times daily. 60 tablet 6  . rosuvastatin (CRESTOR) 40 MG tablet TAKE 1 TABLET BY MOUTH EVERYDAY AT BEDTIME 90 tablet 1   No current facility-administered medications for this visit.    Facility-Administered Medications Ordered in Other Visits  Medication Dose Route Frequency Provider Last Rate Last Dose  . sodium chloride 0.9 % injection 3 mL  3 mL Intravenous PRN Julyssa Kyer, Nadean Corwin, MD        LABS/IMAGING: No results found for this or any previous visit (from the past 48 hour(s)). No results found.  VITALS: BP 127/70   Pulse (!) 53   Ht  5' 5.5" (1.664 m)   Wt 180 lb 6.4 oz (81.8 kg)   BMI 29.56 kg/m   EXAM: General appearance: alert and no distress Neck: no carotid bruit and no JVD Lungs: clear to auscultation bilaterally Heart: regular rate and rhythm, S1, S2 normal and systolic murmur: systolic ejection 3/6, crescendo at 2nd right intercostal space Abdomen: soft, non-tender; bowel sounds normal; no masses,  no organomegaly Extremities: extremities normal, atraumatic, no cyanosis or edema Pulses: 2+ and symmetric Skin: Skin color, texture, turgor normal. No rashes or lesions Neurologic: Grossly normal Psych: Pleasant  EKG: Sinus bradycardia 52, LBBB-personally reviewed  ASSESSMENT: 1. Coronary artery disease with multiple recent PCI to the right coronary - low risk NST- EF 58% (2017) 2. Aortic stenosis status post TAVR (12/2017)-Edwards Sapien 3 (26 mm) 3. Frequent fusion beats-asymptomatic 4. History of bilateral carotid endarterectomies 5. Hypertension 6. Dyslipidemia 7. LBBB 8. Stage III chronic kidney disease  PLAN: 1.   Mr. Farve is doing much better with improvement in his chest pain on Ranexa.  I have kept his dose low given his chronic kidney disease.  Is not a candidate really for repeat angioplasty of the right coronary particularly as he has a significant ostial stent which is not able to be engaged.  He did have successful TAVR last year and overall is doing well.  We will plan continued follow-up as per protocol for that.  Follow-up with me in 6 months or sooner as necessary.  Chrissie Noa  C. Debara Pickett, MD, FACC, [place] Director of the [facility]  [facility] Attending Cardiologist  Direct Dial: [phone]  Fax: [phone]  Website:  [email or web address] 07/22/2019, 11:07 AM

## 2019-07-22 NOTE — Patient Instructions (Signed)
Medication Instructions:  Continue current medications  *If you need a refill on your cardiac medications before your next appointment, please call your pharmacy*  Lab Work: None ordered   Testing/Procedures: None Ordered  Follow-Up: At [facility], you and your health needs are our priority.  As part of our continuing mission to provide you with exceptional heart care, we have created designated Provider Care Teams.  These Care Teams include your primary Cardiologist (physician) and Advanced Practice Providers (APPs -  Physician Assistants and Nurse Practitioners) who all work together to provide you with the care you need, when you need it.  Your next appointment:   6 month(s)  The format for your next appointment:   In Person  Provider:   K. Mali [name], MD

## 2019-07-24 ENCOUNTER — Encounter: Payer: Self-pay | Admitting: Internal Medicine

## 2019-07-28 ENCOUNTER — Encounter (INDEPENDENT_AMBULATORY_CARE_PROVIDER_SITE_OTHER): Payer: Medicare Other | Admitting: Ophthalmology

## 2019-07-28 ENCOUNTER — Other Ambulatory Visit: Payer: Self-pay

## 2019-07-28 DIAGNOSIS — I10 Essential (primary) hypertension: Secondary | ICD-10-CM | POA: Diagnosis not present

## 2019-07-28 DIAGNOSIS — H43813 Vitreous degeneration, bilateral: Secondary | ICD-10-CM

## 2019-07-28 DIAGNOSIS — H34831 Tributary (branch) retinal vein occlusion, right eye, with macular edema: Secondary | ICD-10-CM

## 2019-07-28 DIAGNOSIS — H35033 Hypertensive retinopathy, bilateral: Secondary | ICD-10-CM

## 2019-08-24 ENCOUNTER — Ambulatory Visit: Payer: Medicare (Managed Care) | Attending: Internal Medicine

## 2019-08-24 DIAGNOSIS — Z20822 Contact with and (suspected) exposure to covid-19: Secondary | ICD-10-CM

## 2019-08-25 LAB — NOVEL CORONAVIRUS, NAA: SARS-CoV-2, NAA: NOT DETECTED

## 2019-09-08 ENCOUNTER — Encounter (INDEPENDENT_AMBULATORY_CARE_PROVIDER_SITE_OTHER): Payer: Medicare Other | Admitting: Ophthalmology

## 2019-09-09 ENCOUNTER — Encounter (INDEPENDENT_AMBULATORY_CARE_PROVIDER_SITE_OTHER): Payer: Medicare Other | Admitting: Ophthalmology

## 2019-09-09 DIAGNOSIS — H35033 Hypertensive retinopathy, bilateral: Secondary | ICD-10-CM

## 2019-09-09 DIAGNOSIS — H43813 Vitreous degeneration, bilateral: Secondary | ICD-10-CM | POA: Diagnosis not present

## 2019-09-09 DIAGNOSIS — I10 Essential (primary) hypertension: Secondary | ICD-10-CM | POA: Diagnosis not present

## 2019-09-09 DIAGNOSIS — H34831 Tributary (branch) retinal vein occlusion, right eye, with macular edema: Secondary | ICD-10-CM | POA: Diagnosis not present

## 2019-10-14 ENCOUNTER — Encounter (INDEPENDENT_AMBULATORY_CARE_PROVIDER_SITE_OTHER): Payer: Medicare Other | Admitting: Ophthalmology

## 2019-10-14 ENCOUNTER — Other Ambulatory Visit: Payer: Self-pay

## 2019-10-14 DIAGNOSIS — H34831 Tributary (branch) retinal vein occlusion, right eye, with macular edema: Secondary | ICD-10-CM

## 2019-10-14 DIAGNOSIS — H35033 Hypertensive retinopathy, bilateral: Secondary | ICD-10-CM

## 2019-10-14 DIAGNOSIS — H43813 Vitreous degeneration, bilateral: Secondary | ICD-10-CM | POA: Diagnosis not present

## 2019-10-14 DIAGNOSIS — I10 Essential (primary) hypertension: Secondary | ICD-10-CM

## 2019-10-23 ENCOUNTER — Other Ambulatory Visit: Payer: Self-pay | Admitting: Internal Medicine

## 2019-10-27 ENCOUNTER — Other Ambulatory Visit: Payer: Self-pay | Admitting: Internal Medicine

## 2019-11-11 ENCOUNTER — Other Ambulatory Visit: Payer: Self-pay | Admitting: Internal Medicine

## 2019-11-24 ENCOUNTER — Other Ambulatory Visit: Payer: Self-pay

## 2019-11-24 ENCOUNTER — Encounter (INDEPENDENT_AMBULATORY_CARE_PROVIDER_SITE_OTHER): Payer: Medicare Other | Admitting: Ophthalmology

## 2019-11-24 DIAGNOSIS — I10 Essential (primary) hypertension: Secondary | ICD-10-CM | POA: Diagnosis not present

## 2019-11-24 DIAGNOSIS — H35033 Hypertensive retinopathy, bilateral: Secondary | ICD-10-CM

## 2019-11-24 DIAGNOSIS — H34831 Tributary (branch) retinal vein occlusion, right eye, with macular edema: Secondary | ICD-10-CM

## 2019-11-24 DIAGNOSIS — H43813 Vitreous degeneration, bilateral: Secondary | ICD-10-CM | POA: Diagnosis not present

## 2019-11-25 ENCOUNTER — Encounter (INDEPENDENT_AMBULATORY_CARE_PROVIDER_SITE_OTHER): Payer: Medicare Other | Admitting: Ophthalmology

## 2020-01-04 ENCOUNTER — Telehealth: Payer: Self-pay | Admitting: Internal Medicine

## 2020-01-04 NOTE — Telephone Encounter (Signed)
Contacted patient for appt 01/04/20, left message

## 2020-01-06 ENCOUNTER — Encounter (INDEPENDENT_AMBULATORY_CARE_PROVIDER_SITE_OTHER): Payer: Medicare Other | Admitting: Ophthalmology

## 2020-01-06 ENCOUNTER — Other Ambulatory Visit: Payer: Self-pay

## 2020-01-06 DIAGNOSIS — I10 Essential (primary) hypertension: Secondary | ICD-10-CM

## 2020-01-06 DIAGNOSIS — H4311 Vitreous hemorrhage, right eye: Secondary | ICD-10-CM

## 2020-01-06 DIAGNOSIS — H43813 Vitreous degeneration, bilateral: Secondary | ICD-10-CM

## 2020-01-06 DIAGNOSIS — H34831 Tributary (branch) retinal vein occlusion, right eye, with macular edema: Secondary | ICD-10-CM

## 2020-01-06 DIAGNOSIS — H35033 Hypertensive retinopathy, bilateral: Secondary | ICD-10-CM

## 2020-01-06 DIAGNOSIS — H35372 Puckering of macula, left eye: Secondary | ICD-10-CM

## 2020-02-03 ENCOUNTER — Other Ambulatory Visit: Payer: Self-pay | Admitting: Nephrology

## 2020-02-03 DIAGNOSIS — N1832 Chronic kidney disease, stage 3b: Secondary | ICD-10-CM

## 2020-02-15 ENCOUNTER — Ambulatory Visit
Admission: RE | Admit: 2020-02-15 | Discharge: 2020-02-15 | Disposition: A | Discharge status: Home / Self Care | Payer: Medicare Other | Source: Ambulatory Visit | Attending: Nephrology | Admitting: Nephrology

## 2020-02-15 DIAGNOSIS — N1832 Chronic kidney disease, stage 3b: Secondary | ICD-10-CM

## 2020-02-17 ENCOUNTER — Encounter (INDEPENDENT_AMBULATORY_CARE_PROVIDER_SITE_OTHER): Payer: Medicare Other | Admitting: Ophthalmology

## 2020-04-11 ENCOUNTER — Encounter: Payer: Self-pay | Admitting: Internal Medicine

## 2020-04-11 ENCOUNTER — Other Ambulatory Visit: Payer: Self-pay

## 2020-04-11 ENCOUNTER — Ambulatory Visit (INDEPENDENT_AMBULATORY_CARE_PROVIDER_SITE_OTHER): Payer: Medicare Other | Admitting: Internal Medicine

## 2020-04-11 VITALS — BP 132/70 | HR 49 | Temp 96.8°F | Ht 69.5 in | Wt 177.0 lb

## 2020-04-11 DIAGNOSIS — Z951 Presence of aortocoronary bypass graft: Secondary | ICD-10-CM

## 2020-04-11 DIAGNOSIS — I5032 Chronic diastolic (congestive) heart failure: Secondary | ICD-10-CM | POA: Diagnosis not present

## 2020-04-11 DIAGNOSIS — Z952 Presence of prosthetic heart valve: Secondary | ICD-10-CM | POA: Diagnosis not present

## 2020-04-11 NOTE — Progress Notes (Signed)
OFFICE NOTE  Chief Complaint:  Follow-up chest pain  Primary Care Physician: Deland Pretty, MD  HPI:  [name] is a 86 year old gentleman who had unfortunate aggressive restenosis of an RCA lesion in the past, recently restented. He presented with worsening anginal symptoms and medical therapy was recommended in November. He was scheduled for a nuclear stress test which he underwent on July 21, 2012, and this was negative for ischemia. Recently, he had been experiencing some soreness in his left chest wall which has been going on and off for about a month. He is not experiencing the shortness of breath that he previously experienced with his angina, especially when walking up hills. He noted that he had an episode of a fall when he was trying to lift a very heavy deer, landed on his back and perhaps strained or sprained intercostal muscles or something in the chest wall. Based on his symptoms he underwent stress testing which indicated ischemia and he underwent a repeat coronary intervention to the RCA.   At his last office visit he was doing fairly well, without worsening shortness of breath or chest discomfort.  She did make it through the [place] this [place] but noted a small increase in shortness of breath with activity. He's also had weight gain recently and some lower extremity swelling. He says his symptoms are somewhat reminiscent of the symptoms that required prior coronary intervention about one year ago, but not as significant. Of note, he does have moderate to severe aortic stenosis which were touching very closely and a history of diastolic dysfunction plus or minus heart failure.  [name] returns today and is feeling quite well. He denies any chest pain or shortness of breath. He told me of an episode recently where he helped deliver car and had to walk back home. Walked about 2 half miles during the middle of the day in the heat and had no problems or shortness of breath  or chest discomfort. He actually is feeling the best he has in some time.  [name] back in the office today. He continues to be active. He's been hunting recently and doing a lot of exertion without any worsening shortness of breath or chest pain. He recently had problems with some pain in his legs which he attributed to combination of Crestor and that he had. He discontinued his area and wean down his Crestor. He is currently getting 40 mg tablets and breaking them into thirds. He's taking one third of a 40 mg tablet are roughly 13 mg daily which she says is tolerable. His most recent lipid profile from September 2015 shows a total cholesterol 118, temperature 31, HDL 59 and LDL of 43 which is very low. He should be able to tolerate the decrease in his Crestor and still be at target numbers. He did have a lipoma profile and MR in March 2015. I believe there was an air in triglycerides which read over 700, but LDL-C was less than 100, LDL-P was 700 and generally showed a very favorable profile.  [name] returns today in the office for follow-up. Overall he reports doing fairly well. He continues to be active during the summer denies any chest pain or worsening shortness of breath. He has follow-up of his carotid endarterectomies in November. We are following him for aortic stenosis and he does not seem to be symptomatic with that.  [name] back in the office today for follow-up. He  reports that he has developed again some exertional chest pain over the past few weeks. He feels like a soreness in the left chest wall, but it is also deep inside. Can come at rest, but worse with exertion. There is some exertional dyspnea as well. He has moderate AS by his most recent echo in 03/2015.   [name] returns today for follow-up. He underwent nuclear stress testing which was negative for ischemia. EF was normal at 58%. He reports his chest discomfort is gone away. He is now getting over a chest cold. I  suspect some of his pain was musculoskeletal.  04/14/2016  [name] was seen back today in the office for follow-up. He reports that he is doing very well denies any shortness of breath or chest pain. He is very physically active. In February he underwent nuclear stress testing which was negative for ischemia. We are following moderate aortic stenosis which was seen on his echo last year. He does have stage III chronic kidney disease as well. He's followed by vein and vascular surgery for bilateral carotid endarterectomies. Blood pressure is well-controlled today. Recent lipid profile was within normal limits and is followed by Dr. [name].  10/13/2016  [name] was seen again in follow-up today. He reports feeling "scaringly well". He says he has not felt this well on long time. 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 He denies any chest pain or worsening shortness of breath. He did have an echocardiogram which showed moderate aortic stenosis. Continue to follow that as it is likely to progress despite adequate control of blood pressure and cholesterol. He also has carotid artery disease and is followed by vein and vascular surgery.  04/16/2017  [name] returns today for follow-up. He denies any new chest pain or worsening shortness of breath. His blood pressures been well controlled. Where continue to follow his aortic stenosis and he has an upcoming echo in September. He recently changed primary care providers to Dr. [name], since Dr. [name] has retired. He denies any anginal chest pain.  07/17/2017  [name] was seen today in follow-up.  He says over the past several months he has had some increasing shortness of breath, chest pain and fatigue.  In September he had a repeat echo which showed a stable aortic valve gradient and moderate aortic stenosis.  He has a history of recurrent coronary artery disease requiring  multiple coronary interventions.  Up until recently he has done well without anginal symptoms.  He is noted recently however while hunting or doing other activities requiring exertion, that has had more frequent chest pain, shortness of breath and some fatigue.  He his primary care provider increased his isosorbide up to 90 mg twice daily.  Despite this change is not noted an improvement in his symptoms.  01/05/2018  [name] returns today for follow-up.  He recently called in the office and was reporting significant dyspnea.  This started one morning however by the afternoon had totally resolved.  He has had no further symptoms.  Recently I referred him for evaluation of aortic stenosis and ultimately he was determined to be a candidate for TAVR.  He underwent TAVR on 09/15/2017 with Dr. Angelena Form and Dr. Cyndia Bent.  He had placement of an Edwards Sapien 3 valve (26 mm).  Other than the one episode of shortness of breath, which was reevaluated by a limited echo that  showed normal valve gradients and stable postprocedural valve, he has done very well with marked improvement in his symptoms including improvement in exercise tolerance.  He has not had significant follow-up with his primary care provider since Dr. [name] retired.  He has not had additional lipid testing.  03/04/2018  [name] is seen today in follow-up.  He says that he has had some dyspnea but only with marked exertion.  He denies any chest pain.  He is done clinically very well after TAVR in January 2019.  He had an Edwards Sapien valve and repeat echoes have shown low gradients.  He has good energy level.  He did have repeat lipid testing recently from his PCP which showed an improved total cholesterol 158, HDL 48, LDL 65 and triglycerides of 227.  Given his persistently elevated triglycerides and coronary disease he may also be candidate to add Vascepa.  We discussed the importance of low saturated fat diet.  He also has a history of PAD and has  had bilateral carotid endarterectomies.  03/17/2019  [name] is seen today for in office follow-up.  Recently he is noted some progressive shortness of breath with exertion and chest discomfort.  This is somewhat reminiscent of symptoms he has had in the past.  This seems to be somewhat cyclical as he is had a number of episodes.  Fortunately last year he underwent TAVR and has had a good result with that.  He also has known coronary disease as previously mentioned with no real stenting option to the right coronary.  I have tried to manage that medically.  07/22/2019  [name] returns today for follow-up.  He reports his chest pain has resolved.  In fact he says that the Ranexa was probably the best medication I have ever given him.  He has had significant improvement in his quality of life and ability to do stuff without anginal symptoms.  This suggest that likely his right coronary artery disease is significant however as previously mentioned not amenable to PCI.  His TAVR valve is been stable.  Overall he feels well.  He has been active and continues to do hunting as well as a lot of house chores.  04/11/2020  [name] is seen today in follow-up.  He denies any anginal symptoms.  He recently was seen by nephrology for an elevated creatinine which is being followed.  He had TAVR in 2019 and echo as of September 23, 2018 showed a stable valve gradient.  He denies any worsening shortness of breath.  PMHx:  Past Medical History:  Diagnosis Date  . CAD (coronary artery disease)    a. s/p CABG in 1996 and subsequent PCI  . Carotid artery disease    a. Rt. CEA, 08/04/07, Lt. CEA 04/26/07.  . Chronic kidney disease   . CKD (chronic kidney disease) stage 2, GFR 60-89 ml/min 09/16/2011  . Coronary artery disease   . History of CVA (cerebrovascular accident)    a. 2008  . HTN (hypertension),uncontrolled 09/16/2011  . Hypertension   . LBBB (left bundle branch block)   . Peripheral arterial disease ([place])    . Pulmonary hypertension, mild 09/17/2011  . S/P TAVR (transcatheter aortic valve replacement)    a. 09/15/17: s/p Edwards Sapien 3 THV (size 26 mm, model # 9600TFX, serial # [id number])    Past Surgical History:  Procedure Laterality Date  .  Fluid knee Right Sept. 6, 2014   Fluid removed  . CARDIAC CATHETERIZATION Left 06/10/2012  Ostial RCA 99% stenosis pre-dilation 3x62mm Angiosculpt PTCA balloon, 3.5x3mm Promus Element DES overlapping proximal stent and into the Aorto-ostium, post-dilated with4x71mm [place] balloon - 99% stenosis reduced to 0%  . CARDIAC CATHETERIZATION Left 12/22/2011   Left Main-diffuse tapering 50-60% stenosis; mild-moderate diseaseof the right ventricle branch of the right coronary artery; will plan FFR of ostial RCA  . CARDIAC CATHETERIZATION Bilateral 09/16/2011   Severe 90% ostial RCA disease which is calcified, will likely need PCI to ostial RCA with calcification  . CARDIAC CATHETERIZATION  09/18/2011   Calcified ostial 90-95% proximal RCA stenosis, 3x77mm Emerge balloon predilation, 3x56mm DES PROMUS Element stent was placed, post stent dilation done utilizing a 3.25x73mm noncompliant Quantum Apex balloon, 90-95% stenosis reduced to 0%  . CEA  2008  . CORONARY ANGIOPLASTY    . CORONARY ARTERY BYPASS GRAFT  08/04/2007  . FRACTIONAL FLOW RESERVE WIRE  12/22/2011   Ostial RCA, FFR ratio-0.82, physiologically significant, 3.25x96mm Flextome [place] balloon deployed at ~3.80mm final estimated diameter  . FRACTIONAL FLOW RESERVE WIRE Right 12/22/2011   Procedure: FRACTIONAL FLOW RESERVE WIRE;  Surgeon: Pixie Casino, MD;  Location: [facility] CATH LAB;  Service: Cardiovascular;  Laterality: Right;  . LEFT AND RIGHT HEART CATHETERIZATION WITH CORONARY ANGIOGRAM Left 09/16/2011   Procedure: LEFT AND RIGHT HEART CATHETERIZATION WITH CORONARY ANGIOGRAM;  Surgeon: Pixie Casino, MD;  Location: [facility] CATH LAB;  Service: Cardiovascular;  Laterality: Left;  . LEFT HEART CATHETERIZATION  WITH CORONARY ANGIOGRAM N/A 06/10/2012   Procedure: LEFT HEART CATHETERIZATION WITH CORONARY ANGIOGRAM;  Surgeon: Pixie Casino, MD;  Location: [facility] CATH LAB;  Service: Cardiovascular;  Laterality: N/A;  . LEFT HEART CATHETERIZATION WITH CORONARY/GRAFT ANGIOGRAM N/A 12/22/2011   Procedure: LEFT HEART CATHETERIZATION WITH Beatrix Fetters;  Surgeon: Pixie Casino, MD;  Location: [facility] CATH LAB;  Service: Cardiovascular;  Laterality: N/A;  . PERCUTANEOUS CORONARY ROTOBLATOR INTERVENTION (PCI-R)  09/18/2011   Procedure: PERCUTANEOUS CORONARY ROTOBLATOR INTERVENTION (PCI-R);  Surgeon: Troy Sine, MD;  Location: [facility] CATH LAB;  Service: Cardiovascular;;  . PERCUTANEOUS CORONARY STENT INTERVENTION (PCI-S) N/A 09/18/2011   Procedure: PERCUTANEOUS CORONARY STENT INTERVENTION (PCI-S);  Surgeon: Troy Sine, MD;  Location: [facility] CATH LAB;  Service: Cardiovascular;  Laterality: N/A;  . RIGHT/LEFT HEART CATH AND CORONARY/GRAFT ANGIOGRAPHY N/A 07/30/2017   Procedure: RIGHT/LEFT HEART CATH AND CORONARY/GRAFT ANGIOGRAPHY;  Surgeon: Belva Crome, MD;  Location: [facility] CV LAB;  Service: Cardiovascular;  Laterality: N/A;  . TEE WITHOUT CARDIOVERSION N/A 09/15/2017   Procedure: TRANSESOPHAGEAL ECHOCARDIOGRAM (TEE);  Surgeon: Burnell Blanks, MD;  Location: [place];  Service: Open Heart Surgery;  Laterality: N/A;  . TEMPORARY PACEMAKER INSERTION  09/18/2011   Procedure: TEMPORARY PACEMAKER INSERTION;  Surgeon: Troy Sine, MD;  Location: [facility] CATH LAB;  Service: Cardiovascular;;  . TRANSCATHETER AORTIC VALVE REPLACEMENT, TRANSFEMORAL N/A 09/15/2017   Procedure: TRANSCATHETER AORTIC VALVE REPLACEMENT, TRANSFEMORAL;  Surgeon: Burnell Blanks, MD;  Location: [place];  Service: Open Heart Surgery;  Laterality: N/A;    FAMHx:  Family History  Problem Relation Age of Onset  . Cancer Mother   . Heart disease Mother   . Stroke Mother   . Alcohol abuse Father   . Depression Father   .  Alzheimer's disease Father   . Heart disease Brother        Heart bypass in 1996  . Cancer Brother        Prostate  . Cancer Sister     SOCHx:   reports that  he has never smoked. He has never used smokeless tobacco. He reports that he does not drink alcohol and does not use drugs.  ALLERGIES:  Allergies  Allergen Reactions  . Atorvastatin Other (See Comments)    Muscle and joint pain, can tolerate rosuvastatin    ROS: Pertinent items noted in HPI and remainder of comprehensive ROS otherwise negative.  HOME MEDS: Current Outpatient Medications  Medication Sig Dispense Refill  . amLODipine (NORVASC) 10 MG tablet Take 1 tablet (10 mg total) by mouth at bedtime. 30 tablet 6  . aspirin EC 81 MG tablet Take 81 mg by mouth at bedtime.     . clopidogrel (PLAVIX) 75 MG tablet TAKE 1 TABLET BY MOUTH EVERY DAY 90 tablet 2  . isosorbide mononitrate (IMDUR) 30 MG 24 hr tablet Take 30 mg by mouth 3 (three) times daily.    . metoprolol tartrate (LOPRESSOR) 50 MG tablet Take 25 mg by mouth 2 (two) times daily.    . niacin (NIASPAN) 500 MG CR tablet Take 500 mg by mouth at bedtime.    . nitroGLYCERIN (NITROSTAT) 0.4 MG SL tablet Place 1 tablet (0.4 mg total) under the tongue every 5 (five) minutes x 3 doses as needed. 25 tablet 3  . ranolazine (RANEXA) 500 MG 12 hr tablet TAKE 1 TABLET BY MOUTH TWICE A DAY 60 tablet 8  . rosuvastatin (CRESTOR) 40 MG tablet TAKE 1 TABLET BY MOUTH EVERYDAY AT BEDTIME 90 tablet 2   No current facility-administered medications for this visit.   Facility-Administered Medications Ordered in Other Visits  Medication Dose Route Frequency Provider Last Rate Last Admin  . sodium chloride 0.9 % injection 3 mL  3 mL Intravenous PRN Adama Ferber, Nadean Corwin, MD        LABS/IMAGING: No results found for this or any previous visit (from the past 48 hour(s)). No results found.  VITALS: BP 132/70   Pulse (!) 49   Temp (!) 96.8 F (36 C)   Ht 5' 9.5" (1.765 m)   Wt 177 lb  (80.3 kg)   SpO2 98%   BMI 25.76 kg/m   EXAM: General appearance: alert and no distress Neck: no carotid bruit and no JVD Lungs: clear to auscultation bilaterally Heart: regular rate and rhythm, S1, S2 normal and systolic murmur: systolic ejection 3/6, crescendo at 2nd right intercostal space Abdomen: soft, non-tender; bowel sounds normal; no masses,  no organomegaly Extremities: extremities normal, atraumatic, no cyanosis or edema Pulses: 2+ and symmetric Skin: Skin color, texture, turgor normal. No rashes or lesions Neurologic: Grossly normal Psych: Pleasant  EKG: Sinus bradycardia with left bundle branch block at 49-personally reviewed  ASSESSMENT: 1. Coronary artery disease with multiple recent PCI to the right coronary - low risk NST- EF 58% (2017) 2. Aortic stenosis status post TAVR (12/2017)-Edwards Sapien 3 (26 mm) 3. Frequent fusion beats-asymptomatic 4. History of bilateral carotid endarterectomies 5. Hypertension 6. Dyslipidemia 7. LBBB 8. Stage III chronic kidney disease  PLAN: 1.   [name] seems to be stable without any worsening chest pain or shortness of breath.  He had TAVR in 2019 and had an echo last year which showed stable findings.  We will plan a repeat echo prior to his follow-up in a year.  Blood pressures well controlled.  Left bundle branch block is stable.  He does have stage III chronic kidney disease followed by Dr. Marval Regal at [facility].  Follow-up with me annually or sooner as necessary.  Pixie Casino, MD, [facility],  [place] Director of the Advanced Lipid Disorders &  Cardiovascular Risk Reduction Clinic Attending Cardiologist  Direct Dial: [phone]  Fax: [phone]  Website:  [email or web address] 04/11/2020, 10:59 AM

## 2020-04-11 NOTE — Patient Instructions (Addendum)
Medication Instructions:  Your physician recommends that you continue on your current medications as directed. Please refer to the Current Medication list given to you today.  *If you need a refill on your cardiac medications before your next appointment, please call your pharmacy*  Testing: Echocardiogram @ [address] - 3rd Floor Due August 2022  Follow-Up: At [facility], you and your health needs are our priority.  As part of our continuing mission to provide you with exceptional heart care, we have created designated Provider Care Teams.  These Care Teams include your primary Cardiologist (physician) and Advanced Practice Providers (APPs -  Physician Assistants and Nurse Practitioners) who all work together to provide you with the care you need, when you need it.  We recommend signing up for the patient portal called "MyChart".  Sign up information is provided on this After Visit Summary.  MyChart is used to connect with patients for Virtual Visits (Telemedicine).  Patients are able to view lab/test results, encounter notes, upcoming appointments, etc.  Non-urgent messages can be sent to your provider as well.   To learn more about what you can do with MyChart, go to [email or web address].    Your next appointment:   12 month(s) - after echo  The format for your next appointment:   In Person  Provider:   You may see Pixie Casino, MD or one of the following Advanced Practice Providers on your designated Care Team:    Almyra Deforest, PA-C  Fabian Sharp, PA-C or   Roby Lofts, [place]    Other Instructions

## 2020-04-17 ENCOUNTER — Other Ambulatory Visit: Payer: Self-pay

## 2020-04-17 NOTE — Telephone Encounter (Signed)
Refill request/ unable to locate chart because MBA deleted. This is up to provider

## 2020-04-19 ENCOUNTER — Telehealth: Payer: Self-pay | Admitting: *Deleted

## 2020-04-19 MED ORDER — HALOBETASOL PROPIONATE 0.05 % EX OINT: TOPICAL_OINTMENT | Freq: Two times a day (BID) | CUTANEOUS | 3 refills | Status: DC

## 2020-04-19 NOTE — Telephone Encounter (Signed)
Halobetasol cream for his legs. Done

## 2020-04-19 NOTE — Telephone Encounter (Signed)
Medication refill request.

## 2020-05-07 ENCOUNTER — Other Ambulatory Visit: Payer: Self-pay | Admitting: Internal Medicine

## 2020-05-09 ENCOUNTER — Ambulatory Visit (INDEPENDENT_AMBULATORY_CARE_PROVIDER_SITE_OTHER): Payer: Medicare Other | Admitting: Dermatology

## 2020-05-09 ENCOUNTER — Encounter: Payer: Self-pay | Admitting: Dermatology

## 2020-05-09 ENCOUNTER — Other Ambulatory Visit: Payer: Self-pay

## 2020-05-09 DIAGNOSIS — L309 Dermatitis, unspecified: Secondary | ICD-10-CM | POA: Diagnosis not present

## 2020-05-09 MED ORDER — IVERMECTIN 3 MG PO TABS: ORAL_TABLET | ORAL | 0 refills | Status: DC

## 2020-05-10 ENCOUNTER — Encounter: Payer: Self-pay | Admitting: Dermatology

## 2020-05-10 NOTE — Progress Notes (Signed)
   Follow-Up Visit   Subjective  [name] is a 86 y.o. male who presents for the following: Rash (patient stated that he was seen before for this--described as rash on the back, legs--itchy--noticed for 1-2 months--been using halobetasol).  Itching Location: All over Duration:  Quality:  Associated Signs/Symptoms: Modifying Factors:  Severity:  Timing: Sometimes worse at night Context: Wife recently started itching.  Although she usually sleeps in a separate bed they have shared the same bed.  Objective  Well appearing patient in no apparent distress; mood and affect are within normal limits.  A full examination was performed including scalp, head, eyes, ears, nose, lips, neck, chest, axillae, abdomen, back, buttocks, bilateral upper extremities, bilateral lower extremities, hands, feet, fingers, toes, fingernails, and toenails. All findings within normal limits unless otherwise noted below.   Assessment & Plan    Dermatitis (7) Left Hand - Posterior; Right Hand - Posterior; Left Hip (side) - Posterior; Right Hip (side) - Posterior; Left Thigh - Posterior; Right Thigh - Posterior; Mid Back  Oral ivermectin (80kg; spouse ? 100kg) Rx #12 3mg , no refill.  Stick 6 tablets by mouth as single dose, may repeat in 1 week if still itching.  May cancel follow-up in 1 to 2 months if itching clear.  Advised that if grandson who lives in her house is itching he should be seen by physician.     I, Lavonna Monarch, MD, have reviewed all documentation for this visit.  The documentation on 05/10/20 for the exam, diagnosis, procedures, and orders are all accurate and complete.

## 2020-05-14 ENCOUNTER — Other Ambulatory Visit: Payer: Self-pay | Admitting: Dermatology

## 2020-05-14 NOTE — Telephone Encounter (Signed)
Pharmacy needed icd 10 code for scabies diagnosis ; will call patient to let them know medication is ready.

## 2020-05-14 NOTE — Telephone Encounter (Signed)
Patient called to say that he is trying to get a medication that Lavonna Monarch, MD is prescribing for him, but per [facility] in [place], [place], they need the directions before prescription can be filled. Patient says that he thinks the name of the prescription is Ivermectin.

## 2020-05-16 ENCOUNTER — Other Ambulatory Visit: Payer: Self-pay | Admitting: *Deleted

## 2020-05-16 DIAGNOSIS — I6523 Occlusion and stenosis of bilateral carotid arteries: Secondary | ICD-10-CM

## 2020-06-04 ENCOUNTER — Ambulatory Visit (HOSPITAL_COMMUNITY)
Admission: RE | Admit: 2020-06-04 | Discharge: 2020-06-04 | Disposition: A | Discharge status: Home / Self Care | Payer: Medicare Other | Source: Ambulatory Visit | Attending: Surgery | Admitting: Surgery

## 2020-06-04 ENCOUNTER — Ambulatory Visit (INDEPENDENT_AMBULATORY_CARE_PROVIDER_SITE_OTHER)
Admission: RE | Admit: 2020-06-04 | Discharge: 2020-06-04 | Disposition: A | Discharge status: Home / Self Care | Payer: Medicare Other | Source: Ambulatory Visit | Attending: Surgery | Admitting: Surgery

## 2020-06-04 ENCOUNTER — Other Ambulatory Visit: Payer: Self-pay

## 2020-06-04 ENCOUNTER — Ambulatory Visit: Payer: Medicare Other | Admitting: Surgery

## 2020-06-04 DIAGNOSIS — I6523 Occlusion and stenosis of bilateral carotid arteries: Secondary | ICD-10-CM | POA: Diagnosis present

## 2020-06-05 ENCOUNTER — Ambulatory Visit (INDEPENDENT_AMBULATORY_CARE_PROVIDER_SITE_OTHER): Payer: Medicare Other | Admitting: Dermatology

## 2020-06-05 ENCOUNTER — Encounter: Payer: Self-pay | Admitting: Dermatology

## 2020-06-05 DIAGNOSIS — Z85828 Personal history of other malignant neoplasm of skin: Secondary | ICD-10-CM | POA: Diagnosis not present

## 2020-06-05 DIAGNOSIS — Z8589 Personal history of malignant neoplasm of other organs and systems: Secondary | ICD-10-CM

## 2020-06-05 DIAGNOSIS — I6523 Occlusion and stenosis of bilateral carotid arteries: Secondary | ICD-10-CM

## 2020-06-05 DIAGNOSIS — L209 Atopic dermatitis, unspecified: Secondary | ICD-10-CM | POA: Diagnosis not present

## 2020-06-05 DIAGNOSIS — D485 Neoplasm of uncertain behavior of skin: Secondary | ICD-10-CM

## 2020-06-05 DIAGNOSIS — Z85820 Personal history of malignant melanoma of skin: Secondary | ICD-10-CM | POA: Diagnosis not present

## 2020-06-05 MED ORDER — TACROLIMUS 0.1 % EX OINT: TOPICAL_OINTMENT | Freq: Two times a day (BID) | CUTANEOUS | 1 refills | Status: DC

## 2020-06-05 NOTE — Patient Instructions (Signed)

## 2020-06-07 NOTE — Progress Notes (Signed)
   Follow-Up Visit   Subjective  [name] is a 86 y.o. male who presents for the following: Follow-up (Patient states he is still itching and the Ivermectin didn't help.  Patient states he wants to know what's the next step?).  Itching dermatitis Location:  Duration:  Quality:  Associated Signs/Symptoms: Modifying Factors:  Severity:  Timing: Context:   Objective  Well appearing patient in no apparent distress; mood and affect are within normal limits.  A full examination was performed including scalp, head, eyes, ears, nose, lips, neck, chest, axillae, abdomen, back, buttocks, bilateral upper extremities, bilateral lower extremities, hands, feet, fingers, toes, fingernails, and toenails. All findings within normal limits unless otherwise noted below.   Assessment & Plan    Personal history of malignant melanoma of skin Right Temporal Scalp  History of squamous cell carcinoma (3) Right Shoulder - Anterior; Left Shoulder - Posterior; Right Dorsal Hand  Annual skin exam  Atopic dermatitis, unspecified type (2) Neck - Posterior; Left Lower Back  We will try a topical noncortisone (tacrolimus ointment) for 3 to 4 weeks.  If prescription fails- next step Dupixent  Ordered Medications: tacrolimus (PROTOPIC) 0.1 % ointment  Neoplasm of uncertain behavior of skin (2) Left Lower Back  Skin / nail biopsy Type of biopsy: tangential   Informed consent: discussed and consent obtained   Timeout: patient name, date of birth, surgical site, and procedure verified   Procedure prep:  Patient was prepped and draped in usual sterile fashion (Non sterile) Prep type:  Chlorhexidine Anesthesia: the lesion was anesthetized in a standard fashion   Anesthetic:  1% lidocaine w/ epinephrine 1-100,000 local infiltration Instrument used: flexible razor blade   Outcome: patient tolerated procedure well   Post-procedure details: wound care instructions given    Specimen 1 - Surgical  pathology Differential Diagnosis: CTCL Check Margins: No  Right Upper Back  Skin / nail biopsy Type of biopsy: tangential   Informed consent: discussed and consent obtained   Timeout: patient name, date of birth, surgical site, and procedure verified   Procedure prep:  Patient was prepped and draped in usual sterile fashion (Non sterile) Prep type:  Chlorhexidine Anesthesia: the lesion was anesthetized in a standard fashion   Anesthetic:  1% lidocaine w/ epinephrine 1-100,000 local infiltration Instrument used: flexible razor blade   Outcome: patient tolerated procedure well   Post-procedure details: wound care instructions given    Specimen 2 - Surgical pathology Differential Diagnosis: CTCL Check Margins: No     I, Lavonna Monarch, MD, have reviewed all documentation for this visit.  The documentation on 07/14/20 for the exam, diagnosis, procedures, and orders are all accurate and complete.

## 2020-06-08 ENCOUNTER — Telehealth: Payer: Self-pay | Admitting: *Deleted

## 2020-06-08 NOTE — Telephone Encounter (Signed)
-----   Message from Lavonna Monarch, MD sent at 06/08/2020  9:13 AM EDT ----- Please inform Mr. Keeter that the biopsies did not support a diagnosis of adult eczema.  If topical therapies do not help, we will try to obtain affordable Dupixent.

## 2020-06-08 NOTE — Telephone Encounter (Signed)
Path to patient. He is currently using tacrolimus cream and per Dr. Denna Haggard we will try to obtain dupixent.

## 2020-06-18 ENCOUNTER — Telehealth: Payer: Self-pay | Admitting: Dermatology

## 2020-06-18 NOTE — Telephone Encounter (Signed)
sent at 06/08/2020  9:13 AM EDT ----- Please inform [name] that the biopsies did not support a diagnosis of adult eczema.  If topical therapies do not help, we will try to obtain affordable Dupixent.

## 2020-06-18 NOTE — Telephone Encounter (Signed)
faxed

## 2020-06-18 NOTE — Telephone Encounter (Signed)
Not clear about reason. Michela Pitcher he wants appointment but already scheduled in January, then said he wants to know about dupixent. On line 1 now

## 2020-06-19 ENCOUNTER — Telehealth: Payer: Self-pay

## 2020-06-19 NOTE — Telephone Encounter (Signed)
Fax received from [facility] stating they have received our prescription referral and they are verifying the patient's insurance information.

## 2020-06-21 ENCOUNTER — Telehealth: Payer: Self-pay

## 2020-06-21 NOTE — Telephone Encounter (Signed)
Fax received from [facility] stating that the patient's Dupixent is approved 06/21/2020-10/19/2020

## 2020-06-29 ENCOUNTER — Telehealth: Payer: Self-pay | Admitting: Dermatology

## 2020-06-29 NOTE — Telephone Encounter (Signed)
Patient was calling because he said he still has not received his dupixent. [facility] and told him to call them for update and make sure they have all his correct information. He will call today and then he will call to schedule training once he gets his medication.

## 2020-06-29 NOTE — Telephone Encounter (Signed)
Patient left message on office voice mail that he would like to speak with someone about qa medication that was called in for him.

## 2020-06-29 NOTE — Telephone Encounter (Signed)
Left patient message to call us back.

## 2020-07-02 ENCOUNTER — Other Ambulatory Visit: Payer: Self-pay

## 2020-07-02 ENCOUNTER — Encounter: Payer: Self-pay | Admitting: Surgery

## 2020-07-02 ENCOUNTER — Ambulatory Visit (INDEPENDENT_AMBULATORY_CARE_PROVIDER_SITE_OTHER): Payer: Medicare Other | Admitting: Surgery

## 2020-07-02 VITALS — BP 153/72 | HR 63 | Temp 98.0°F | Resp 20 | Ht 69.5 in | Wt 172.0 lb

## 2020-07-02 DIAGNOSIS — I6523 Occlusion and stenosis of bilateral carotid arteries: Secondary | ICD-10-CM

## 2020-07-02 NOTE — Progress Notes (Signed)
Vascular and Vein Specialist of [facility]  Patient name: [name] MRN: [id number] DOB: 04-21-1934 Sex: male   REQUESTING PROVIDER:    Clarisa Schools   REASON FOR CONSULT:    PAD  HISTORY OF PRESENT ILLNESS:   [name] is a 86 y.o. male, who well-known to me having undergone left carotid endarterectomy for symptomatic stenosis in September 2008, and right carotid endarterectomy for asymptomatic stenosis in December 2008. He remains without symptoms and is back for follow-up surveillance.  He also has undergone radiographic imaging of his knees and plaque was visualized. He denies symptoms of claudication.   The patient has undergone TAVR in 2019.  He is also undergone three-vessel CABG in the late 90s.  He is medically managed for hypertension.  He takes a statin for hypercholesterolemia.  He is in medical.  PAST MEDICAL HISTORY    Past Medical History:  Diagnosis Date  . CAD (coronary artery disease)    a. s/p CABG in 1996 and subsequent PCI  . Carotid artery disease    a. Rt. CEA, 08/04/07, Lt. CEA 04/26/07.  . Chronic kidney disease   . CKD (chronic kidney disease) stage 2, GFR 60-89 ml/min 09/16/2011  . Coronary artery disease   . History of CVA (cerebrovascular accident)    a. 2008  . HTN (hypertension),uncontrolled 09/16/2011  . Hypertension   . LBBB (left bundle branch block)   . Melanoma ([place]) 03/28/2019   Right [place] (in situ) [facility])  . Peripheral arterial disease ([place])   . Pulmonary hypertension, mild 09/17/2011  . S/P TAVR (transcatheter aortic valve replacement)    a. 09/15/17: s/p Edwards Sapien 3 THV (size 26 mm, model # 9600TFX, serial # [id number])  . SCCA (squamous cell carcinoma) of skin 03/28/2019   Right Shoulder (curet and 5FU)  . SCCA (squamous cell carcinoma) of skin 03/28/2019   Right Top Hand (in situ) (curet and 5FU)  . Squamous cell carcinoma of skin 05/17/2018   Left Post Shoulder (well diff) ([facility])      FAMILY HISTORY   Family History  Problem Relation Age of Onset  . Cancer Mother   . Heart disease Mother   . Stroke Mother   . Alcohol abuse Father   . Depression Father   . Alzheimer's disease Father   . Heart disease Brother        Heart bypass in 1996  . Cancer Brother        Prostate  . Cancer Sister     SOCIAL HISTORY:   Social History   Socioeconomic History  . Marital status: Married    Spouse name: Not on file  . Number of children: Not on file  . Years of education: Not on file  . Highest education level: Not on file  Occupational History  . Not on file  Tobacco Use  . Smoking status: Never Smoker  . Smokeless tobacco: Never Used  . Tobacco comment: rarely smoked when he did smoke   Vaping Use  . Vaping Use: Never used  Substance and Sexual Activity  . Alcohol use: No    Alcohol/week: 0.0 standard drinks  . Drug use: No  . Sexual activity: Not Currently  Other Topics Concern  . Not on file  Social History Narrative  . Not on file   Social Determinants of Health   Financial Resource Strain:   . Difficulty of Paying Living Expenses: Not on file  Food Insecurity:   . Worried About Estate manager/land agent  of Food in the Last Year: Not on file  . Ran Out of Food in the Last Year: Not on file  Transportation Needs:   . Lack of Transportation (Medical): Not on file  . Lack of Transportation (Non-Medical): Not on file  Physical Activity:   . Days of Exercise per Week: Not on file  . Minutes of Exercise per Session: Not on file  Stress:   . Feeling of Stress : Not on file  Social Connections:   . Frequency of Communication with Friends and Family: Not on file  . Frequency of Social Gatherings with Friends and Family: Not on file  . Attends Religious Services: Not on file  . Active Member of Clubs or Organizations: Not on file  . Attends Archivist Meetings: Not on file  . Marital Status: Not on file  Intimate Partner Violence:   . Fear of  Current or Ex-Partner: Not on file  . Emotionally Abused: Not on file  . Physically Abused: Not on file  . Sexually Abused: Not on file    ALLERGIES:    Allergies  Allergen Reactions  . Atorvastatin Other (See Comments)    Muscle and joint pain, can tolerate rosuvastatin    CURRENT MEDICATIONS:    Current Outpatient Medications  Medication Sig Dispense Refill  . amLODipine (NORVASC) 10 MG tablet Take 1 tablet (10 mg total) by mouth at bedtime. 30 tablet 6  . aspirin EC 81 MG tablet Take 81 mg by mouth at bedtime.     . clopidogrel (PLAVIX) 75 MG tablet TAKE 1 TABLET BY MOUTH EVERY DAY 90 tablet 2  . halobetasol (ULTRAVATE) 0.05 % ointment Apply topically 2 (two) times daily. 50 g 3  . isosorbide mononitrate (IMDUR) 30 MG 24 hr tablet Take 30 mg by mouth 3 (three) times daily.    Marland Kitchen ivermectin (STROMECTOL) 3 MG TABS tablet Taking 18 mg (6 tablets) by mouth once. May repeat in 1 to 2 weeks 12 tablet 0  . metoprolol tartrate (LOPRESSOR) 50 MG tablet Take 25 mg by mouth 2 (two) times daily.    . niacin (NIASPAN) 500 MG CR tablet Take 500 mg by mouth at bedtime.    . nitroGLYCERIN (NITROSTAT) 0.4 MG SL tablet Place 1 tablet (0.4 mg total) under the tongue every 5 (five) minutes x 3 doses as needed. 25 tablet 3  . ranolazine (RANEXA) 500 MG 12 hr tablet TAKE 1 TABLET BY MOUTH TWICE A DAY 180 tablet 2  . rosuvastatin (CRESTOR) 40 MG tablet TAKE 1 TABLET BY MOUTH EVERYDAY AT BEDTIME 90 tablet 2  . tacrolimus (PROTOPIC) 0.1 % ointment Apply topically in the morning and at bedtime. 100 g 1   No current facility-administered medications for this visit.   Facility-Administered Medications Ordered in Other Visits  Medication Dose Route Frequency Provider Last Rate Last Admin  . sodium chloride 0.9 % injection 3 mL  3 mL Intravenous PRN [name], Nadean Corwin, MD        REVIEW OF SYSTEMS:   [X]  denotes positive finding, [ ]  denotes negative finding Cardiac  Comments:  Chest pain or chest  pressure:    Shortness of breath upon exertion:    Short of breath when lying flat:    Irregular heart rhythm:        Vascular    Pain in calf, thigh, or hip brought on by ambulation:    Pain in feet at night that wakes you up from your sleep:  Blood clot in your veins:    Leg swelling:         Pulmonary    Oxygen at home:    Productive cough:     Wheezing:         Neurologic    Sudden weakness in arms or legs:     Sudden numbness in arms or legs:     Sudden onset of difficulty speaking or slurred speech:    Temporary loss of vision in one eye:     Problems with dizziness:         Gastrointestinal    Blood in stool:      Vomited blood:         Genitourinary    Burning when urinating:     Blood in urine:        Psychiatric    Major depression:         Hematologic    Bleeding problems:    Problems with blood clotting too easily:        Skin    Rashes or ulcers:        Constitutional    Fever or chills:     PHYSICAL EXAM:   There were no vitals filed for this visit.  GENERAL: The patient is a well-nourished male, in no acute distress. The vital signs are documented above. CARDIAC: There is a regular rate and rhythm.  VASCULAR: Palpable pedal pulses bilaterally. I was unable to appreciate a prominent popliteal artery PULMONARY: Nonlabored respirations ABDOMEN: Soft and non-tender with normal pitched bowel sounds.  MUSCULOSKELETAL: There are no major deformities or cyanosis. NEUROLOGIC: No focal weakness or paresthesias are detected. SKIN: There are no ulcers or rashes noted. PSYCHIATRIC: The patient has a normal affect.  STUDIES:   I have reviewed the following: +-------+-----------+-----------+------------+------------+  ABI/TBIToday's ABIToday's TBIPrevious ABIPrevious TBI  +-------+-----------+-----------+------------+------------+  Right 1.32    1.04                   +-------+-----------+-----------+------------+------------+  Left  1.35    1.08                  +-------+-----------+-----------+------------+------------+  Right toe pressure= 148 Left toe pressure= 154   Carotid: Right Carotid: Velocities in the right ICA are consistent with a 1-39%  stenosis.   Left Carotid: Velocities in the left ICA are consistent with a 1-39%  stenosis.   Vertebrals: Bilateral vertebral arteries demonstrate antegrade flow.  Subclavians: Normal flow hemodynamics were seen in bilateral subclavian        arteries.    ASSESSMENT and PLAN   Carotid: Bilateral endarterectomy site remains widely patent, he will follow-up in 1 year with repeat duplex  Popliteal artery calcification: The patient does not have clinical evidence of aneurysmal changes and CT scan from a few years back did not show any evidence of ectasia on the right side. I suspect this is regular atherosclerotic disease, for which he is asymptomatic. He is going to come back in 1 year for follow-up  Annamarie Major, IV, MD, FACS [facility] [phone] Pager [phone]

## 2020-07-10 ENCOUNTER — Other Ambulatory Visit: Payer: Self-pay | Admitting: Dermatology

## 2020-07-11 ENCOUNTER — Ambulatory Visit: Payer: Medicare Other | Admitting: Dermatology

## 2020-07-14 ENCOUNTER — Encounter: Payer: Self-pay | Admitting: Dermatology

## 2020-07-16 ENCOUNTER — Telehealth: Payer: Self-pay

## 2020-07-16 NOTE — Telephone Encounter (Signed)
Fax received from [facility] needing clarification on patient's Dupixent. Clarification faxed back to [facility] @ [phone].

## 2020-07-23 ENCOUNTER — Ambulatory Visit (INDEPENDENT_AMBULATORY_CARE_PROVIDER_SITE_OTHER): Payer: Medicare Other

## 2020-07-23 ENCOUNTER — Other Ambulatory Visit: Payer: Self-pay

## 2020-07-23 DIAGNOSIS — L309 Dermatitis, unspecified: Secondary | ICD-10-CM

## 2020-07-23 MED ORDER — DUPILUMAB 300 MG/2ML ~~LOC~~ SOSY
600.0000 mg | PREFILLED_SYRINGE | Freq: Once | SUBCUTANEOUS | Status: AC
  Administered 2020-07-23: 600 mg via SUBCUTANEOUS

## 2020-07-23 NOTE — Progress Notes (Signed)
Right abdomen was used for first injection, second injection was administered by the patient in the left abdomen. No reaction to Dupixent and patient was released.

## 2020-08-14 ENCOUNTER — Telehealth: Payer: Self-pay | Admitting: *Deleted

## 2020-08-14 NOTE — Telephone Encounter (Signed)
Phone call to patient to check on his dupixent to see if he was receiving the medication. He only received initial dose. [facility] and they said copay is 350 and he will have to pay that to get his next dose. I called patient back and he is going to call the [place] to see about the patient assistants program and he will get his house hold income information since they will need all that information.

## 2020-08-16 ENCOUNTER — Telehealth: Payer: Self-pay | Admitting: Dermatology

## 2020-08-16 NOTE — Telephone Encounter (Signed)
Patient called number that Maudie Mercury gave him and they told him that they need a new RX for [place] faxed to [phone].

## 2020-08-16 NOTE — Telephone Encounter (Signed)
Phone call to patient to see where we needed to send the prescription?  Per patient [facility] informed him that they needed a prescription from our office before they can fill anything for him or enroll him in [place].  I informed patient that we would get the information over to [facility].

## 2020-08-16 NOTE — Telephone Encounter (Signed)
[place] [facility] enrollment form faxed to [facility].

## 2020-08-21 ENCOUNTER — Encounter: Payer: Self-pay | Admitting: Dermatology

## 2020-08-21 ENCOUNTER — Ambulatory Visit: Payer: Medicare PPO | Admitting: Dermatology

## 2020-08-21 ENCOUNTER — Other Ambulatory Visit: Payer: Self-pay

## 2020-08-21 DIAGNOSIS — L2084 Intrinsic (allergic) eczema: Secondary | ICD-10-CM | POA: Diagnosis not present

## 2020-08-21 NOTE — Patient Instructions (Addendum)
Follow-up for [name].  Date of birth [birth year].  He has taken 3 shots of Dupixent and is due for the fourth.  Initially had to pay $350 but we are working at reducing this cost.  There is 50 to 70% reduction in visible rash and itching with subtle persistent pink eczematous patches on both legs.  I encouraged him to hydrate his skin with something like Curel lotion daily after bathing.  If this is not adequate, he can call me and we could retry one of the prescription eczema creams on his legs.  He understands that while there is no need for any monitoring on the Dupixent, it is not represent a true cure and the dose and frequency of the injections are fixed.  If he is doing well I would like the next follow-up to be via MyChart or by phone in 2 months simply for a status report, we will do refills for up to 1 year without visits if all else is going well.

## 2020-08-26 ENCOUNTER — Encounter: Payer: Self-pay | Admitting: Dermatology

## 2020-08-26 NOTE — Progress Notes (Signed)
   Follow-Up Visit   Subjective  [name] is a 87 y.o. male who presents for the following: Follow-up (Patient here today for Dupixent follow up.  Per patient the Dupixent is helping).  Eczema Location: All over Duration:  Quality: Mostly clear except for legs Associated Signs/Symptoms: Severe itching Modifying Factors: Dupixent Severity:  Timing: Context:   Objective  Well appearing patient in no apparent distress; mood and affect are within normal limits. Objective  Left Lower Back, Left Thigh - Posterior, Left Upper Arm - Posterior, Right Abdomen (side) - Upper, Right Lower Back, Right Thigh - Posterior, Right Upper Arm - Posterior: Itching with breaking out historically involving up to 50% of his body surface area and interfering with sleep and negatively impacting his quality of life.  Perhaps 75% clearing with roughly 2 months of Dupixent.  Denies ocular side effects and no local injection reactions.  No other known side effects.    A focused examination was performed including Head, neck, torso, arms, legs.  Eyes.. Relevant physical exam findings are noted in the Assessment and Plan.   Assessment & Plan    Intrinsic eczema (7) Left Upper Arm - Posterior; Right Upper Arm - Posterior; Left Thigh - Posterior; Right Thigh - Posterior; Right Abdomen (side) - Upper; Left Lower Back; Right Lower Back  Maintain current Dupixent dosing.  He knows he can contact me if either the out-of-pocket cost becomes too high or the medication stops working.  If doing well, next follow-up by MyChart or telephone in 2 to 3 months.   Follow-up for [name].  Date of birth August 28, 1933.  He has taken 3 shots of Dupixent and is due for the fourth.  Initially had to pay $350 but we are working at reducing this cost.  There is 50 to 70% reduction in visible rash and itching with subtle persistent pink eczematous patches on both legs.  I encouraged him to hydrate his skin with something like Curel  lotion daily after bathing.  If this is not adequate, he can call me and we could retry one of the prescription eczema creams on his legs.  He understands that while there is no need for any monitoring on the Dupixent, it is not represent a true cure and the dose and frequency of the injections are fixed.  If he is doing well I would like the next follow-up to be via MyChart or by phone in 2 months simply for a status report, we will do refills for up to 1 year without visits if all else is going well.   I, Lavonna Monarch, MD, have reviewed all documentation for this visit.  The documentation on 08/26/20 for the exam, diagnosis, procedures, and orders are all accurate and complete.

## 2020-08-29 ENCOUNTER — Other Ambulatory Visit: Payer: Self-pay | Admitting: Internal Medicine

## 2020-09-21 ENCOUNTER — Telehealth: Payer: Self-pay

## 2020-09-21 ENCOUNTER — Other Ambulatory Visit: Payer: Self-pay | Admitting: Dermatology

## 2020-09-21 DIAGNOSIS — L209 Atopic dermatitis, unspecified: Secondary | ICD-10-CM

## 2020-09-21 NOTE — Telephone Encounter (Signed)
Phone call to [place] to check on the status of patient's Dupixent.  I spoke with Anderson Malta with [facility] and she states that the patient is eligible for the patient assistance program.  Per Anderson Malta they just need the patient's income, Anderson Malta states she's not sure why the patient's nurse educator who's assigned to his case hasn't call him patient to get information.  Per Anderson Malta she will reach out to the nurse assigned to the patient's case and have that nurse reach the patient by the end of the day today so they can get patient's medication out to him.  Per Anderson Malta nothing else is needed on our end.

## 2020-10-05 ENCOUNTER — Telehealth: Payer: Self-pay | Admitting: Internal Medicine

## 2020-10-05 NOTE — Telephone Encounter (Signed)
Pt c/o Shortness Of Breath: STAT if SOB developed within the last 24 hours or pt is noticeably SOB on the phone  1. Are you currently SOB (can you hear that pt is SOB on the phone)? No   2. How long have you been experiencing SOB? Past few weeks and SOB is becoming progressively worse, per patient  3. Are you SOB when sitting or when up moving around? When up and moving around  4. Are you currently experiencing any other symptoms? No

## 2020-10-05 NOTE — Telephone Encounter (Signed)
Thanks Almyra Free - I'll look for Luke's note.  Dr Lemmie Evens

## 2020-10-05 NOTE — Telephone Encounter (Signed)
Called patient back, he states that he has been having some SOB for over a month now, but it has worsened and he just wanted to make [place] aware. He states it is not with rest, but with activities. He does not check his BP at home and he states he does not know if he has any weight gain, but does mention having some chest discomfort when he is trying to catch his breath. He states he notices some swelling in his ankles when he takes hi socks off. He has had no medication changes.  Patient was advised of when to go to ED for urgent evaluation, and made appointment for next week 02/22 with Kerin Ransom, [place]. Patient verbalized understanding, thankful for call back- he was advised of on call service as well.  Will route to MD to make aware, and PA that he will be seeing.

## 2020-10-09 ENCOUNTER — Other Ambulatory Visit: Payer: Self-pay

## 2020-10-09 ENCOUNTER — Ambulatory Visit: Payer: Medicare PPO | Admitting: Cardiology

## 2020-10-09 ENCOUNTER — Encounter: Payer: Self-pay | Admitting: Cardiology

## 2020-10-09 VITALS — BP 124/60 | HR 52 | Ht 69.0 in | Wt 183.0 lb

## 2020-10-09 DIAGNOSIS — Z952 Presence of prosthetic heart valve: Secondary | ICD-10-CM

## 2020-10-09 DIAGNOSIS — I5033 Acute on chronic diastolic (congestive) heart failure: Secondary | ICD-10-CM

## 2020-10-09 DIAGNOSIS — I447 Left bundle-branch block, unspecified: Secondary | ICD-10-CM | POA: Diagnosis not present

## 2020-10-09 DIAGNOSIS — Z951 Presence of aortocoronary bypass graft: Secondary | ICD-10-CM

## 2020-10-09 DIAGNOSIS — R0600 Dyspnea, unspecified: Secondary | ICD-10-CM

## 2020-10-09 DIAGNOSIS — R0609 Other forms of dyspnea: Secondary | ICD-10-CM

## 2020-10-09 DIAGNOSIS — N1831 Chronic kidney disease, stage 3a: Secondary | ICD-10-CM

## 2020-10-09 DIAGNOSIS — R001 Bradycardia, unspecified: Secondary | ICD-10-CM

## 2020-10-09 DIAGNOSIS — I2510 Atherosclerotic heart disease of native coronary artery without angina pectoris: Secondary | ICD-10-CM

## 2020-10-09 DIAGNOSIS — Z8673 Personal history of transient ischemic attack (TIA), and cerebral infarction without residual deficits: Secondary | ICD-10-CM

## 2020-10-09 DIAGNOSIS — I10 Essential (primary) hypertension: Secondary | ICD-10-CM

## 2020-10-09 DIAGNOSIS — Z9861 Coronary angioplasty status: Secondary | ICD-10-CM

## 2020-10-09 MED ORDER — METOPROLOL TARTRATE 25 MG PO TABS: 12.5000 mg | ORAL_TABLET | Freq: Two times a day (BID) | ORAL | 3 refills | Status: DC

## 2020-10-09 MED ORDER — FUROSEMIDE 40 MG PO TABS: 40.0000 mg | ORAL_TABLET | Freq: Every day | ORAL | 3 refills | Status: DC

## 2020-10-09 NOTE — Patient Instructions (Signed)
Medication Instructions:  DECREASE- Metoprolol Tartrate 12.5 mg by mouth twice a day START- Furosemide(Lasix) 40 mg by mouth daily  *If you need a refill on your cardiac medications before your next appointment, please call your pharmacy*   Lab Work: BMP, BNP, CBC  If you have labs (blood work) drawn today and your tests are completely normal, you will receive your results only by: Marland Kitchen MyChart Message (if you have MyChart) OR . A paper copy in the mail If you have any lab test that is abnormal or we need to change your treatment, we will call you to review the results.   Testing/Procedures: Your physician has requested that you have an echocardiogram. Echocardiography is a painless test that uses sound waves to create images of your heart. It provides your doctor with information about the size and shape of your heart and how well your heart's chambers and valves are working. This procedure takes approximately one hour. There are no restrictions for this procedure.   Follow-Up: At [facility], you and your health needs are our priority.  As part of our continuing mission to provide you with exceptional heart care, we have created designated Provider Care Teams.  These Care Teams include your primary Cardiologist (physician) and Advanced Practice Providers (APPs -  Physician Assistants and Nurse Practitioners) who all work together to provide you with the care you need, when you need it.  We recommend signing up for the patient portal called "MyChart".  Sign up information is provided on this After Visit Summary.  MyChart is used to connect with patients for Virtual Visits (Telemedicine).  Patients are able to view lab/test results, encounter notes, upcoming appointments, etc.  Non-urgent messages can be sent to your provider as well.   To learn more about what you can do with MyChart, go to [email or web address].    Your next appointment:   2 week(s)  The format for your next  appointment:   In Person  Provider:   You may see Pixie Casino, MD or one of the following Advanced Practice Providers on your designated Care Team:    Kerin Ransom, [place]

## 2020-10-09 NOTE — Assessment & Plan Note (Signed)
Check BMP today and at f/u

## 2020-10-09 NOTE — Progress Notes (Signed)
Cardiology Office Note:    Date:  10/09/2020   ID:  BIRD SWETZ, DOB 06-26-1934, MRN [id number]  PCP:  Deland Pretty, MD  Cardiologist:  Pixie Casino, MD  Electrophysiologist:  None   Referring MD: Deland Pretty, MD   CC: DOE and fatigue  History of Present Illness:    [name] is a 87 y.o. male with a hx of CAD, chronic renal insufficiency, TAVR, hypertension, PVD, and chronic bradycardia with left bundle branch block.  Patient had CABG in 1996.  After that he had multiple RCA PCI's, the last was in 2013.  He had a TAVR in January 2019, echocardiogram in February 2020 showed preserved LV function with good valve function.  He has chronic bradycardia with heart rates in the 50s and the left bundle branch block.  Patient seen in the office today with complaints of increasing shortness of breath with exertion and fatigue with exertion.  He says his symptoms have been coming on gradually for the past 6 months.  He says it reached a point now where he does not feel like he can "do anything".  Previously he was active.  He is not had syncope.  He has had some lower extremity edema.  Past Medical History:  Diagnosis Date  . CAD (coronary artery disease)    a. s/p CABG in 1996 and subsequent PCI  . Carotid artery disease    a. Rt. CEA, 08/04/07, Lt. CEA 04/26/07.  . Chronic kidney disease   . CKD (chronic kidney disease) stage 2, GFR 60-89 ml/min 09/16/2011  . Coronary artery disease   . History of CVA (cerebrovascular accident)    a. 2008  . HTN (hypertension),uncontrolled 09/16/2011  . Hypertension   . LBBB (left bundle branch block)   . Melanoma ([place]) 03/28/2019   Right [place] (in situ) [facility])  . Peripheral arterial disease ([place])   . Pulmonary hypertension, mild 09/17/2011  . S/P TAVR (transcatheter aortic valve replacement)    a. 09/15/17: s/p Edwards Sapien 3 THV (size 26 mm, model # 9600TFX, serial # [id number])  . SCCA (squamous cell carcinoma) of skin 03/28/2019   Right  Shoulder (curet and 5FU)  . SCCA (squamous cell carcinoma) of skin 03/28/2019   Right Top Hand (in situ) (curet and 5FU)  . Squamous cell carcinoma of skin 05/17/2018   Left Post Shoulder (well diff) ([facility])    Past Surgical History:  Procedure Laterality Date  .  Fluid knee Right Sept. 6, 2014   Fluid removed  . CARDIAC CATHETERIZATION Left 06/10/2012   Ostial RCA 99% stenosis pre-dilation 3x95mm Angiosculpt PTCA balloon, 3.5x52mm Promus Element DES overlapping proximal stent and into the Aorto-ostium, post-dilated with4x81mm  Trek balloon - 99% stenosis reduced to 0%  . CARDIAC CATHETERIZATION Left 12/22/2011   Left Main-diffuse tapering 50-60% stenosis; mild-moderate diseaseof the right ventricle branch of the right coronary artery; will plan FFR of ostial RCA  . CARDIAC CATHETERIZATION Bilateral 09/16/2011   Severe 90% ostial RCA disease which is calcified, will likely need PCI to ostial RCA with calcification  . CARDIAC CATHETERIZATION  09/18/2011   Calcified ostial 90-95% proximal RCA stenosis, 3x36mm Emerge balloon predilation, 3x11mm DES PROMUS Element stent was placed, post stent dilation done utilizing a 3.25x65mm noncompliant Quantum Apex balloon, 90-95% stenosis reduced to 0%  . CEA  2008  . CORONARY ANGIOPLASTY    . CORONARY ARTERY BYPASS GRAFT  08/04/2007  . FRACTIONAL FLOW RESERVE WIRE  12/22/2011   Ostial RCA, FFR ratio-0.82,  physiologically significant, 3.25x9mm Flextome Cutting balloon deployed at ~3.43mm final estimated diameter  . FRACTIONAL FLOW RESERVE WIRE Right 12/22/2011   Procedure: FRACTIONAL FLOW RESERVE WIRE;  Surgeon: Pixie Casino, MD;  Location: [facility] CATH LAB;  Service: Cardiovascular;  Laterality: Right;  . LEFT AND RIGHT HEART CATHETERIZATION WITH CORONARY ANGIOGRAM Left 09/16/2011   Procedure: LEFT AND RIGHT HEART CATHETERIZATION WITH CORONARY ANGIOGRAM;  Surgeon: Pixie Casino, MD;  Location: [facility] CATH LAB;  Service: Cardiovascular;  Laterality: Left;  . LEFT  HEART CATHETERIZATION WITH CORONARY ANGIOGRAM N/A 06/10/2012   Procedure: LEFT HEART CATHETERIZATION WITH CORONARY ANGIOGRAM;  Surgeon: Pixie Casino, MD;  Location: [facility] CATH LAB;  Service: Cardiovascular;  Laterality: N/A;  . LEFT HEART CATHETERIZATION WITH CORONARY/GRAFT ANGIOGRAM N/A 12/22/2011   Procedure: LEFT HEART CATHETERIZATION WITH Beatrix Fetters;  Surgeon: Pixie Casino, MD;  Location: [facility] CATH LAB;  Service: Cardiovascular;  Laterality: N/A;  . PERCUTANEOUS CORONARY ROTOBLATOR INTERVENTION (PCI-R)  09/18/2011   Procedure: PERCUTANEOUS CORONARY ROTOBLATOR INTERVENTION (PCI-R);  Surgeon: Troy Sine, MD;  Location: [facility] CATH LAB;  Service: Cardiovascular;;  . PERCUTANEOUS CORONARY STENT INTERVENTION (PCI-S) N/A 09/18/2011   Procedure: PERCUTANEOUS CORONARY STENT INTERVENTION (PCI-S);  Surgeon: Troy Sine, MD;  Location: [facility] CATH LAB;  Service: Cardiovascular;  Laterality: N/A;  . RIGHT/LEFT HEART CATH AND CORONARY/GRAFT ANGIOGRAPHY N/A 07/30/2017   Procedure: RIGHT/LEFT HEART CATH AND CORONARY/GRAFT ANGIOGRAPHY;  Surgeon: Belva Crome, MD;  Location: [place] CV LAB;  Service: Cardiovascular;  Laterality: N/A;  . TEE WITHOUT CARDIOVERSION N/A 09/15/2017   Procedure: TRANSESOPHAGEAL ECHOCARDIOGRAM (TEE);  Surgeon: Burnell Blanks, MD;  Location: [place];  Service: Open Heart Surgery;  Laterality: N/A;  . TEMPORARY PACEMAKER INSERTION  09/18/2011   Procedure: TEMPORARY PACEMAKER INSERTION;  Surgeon: Troy Sine, MD;  Location: [facility] CATH LAB;  Service: Cardiovascular;;  . TRANSCATHETER AORTIC VALVE REPLACEMENT, TRANSFEMORAL N/A 09/15/2017   Procedure: TRANSCATHETER AORTIC VALVE REPLACEMENT, TRANSFEMORAL;  Surgeon: Burnell Blanks, MD;  Location: [place];  Service: Open Heart Surgery;  Laterality: N/A;    Current Medications: Current Meds  Medication Sig  . furosemide (LASIX) 40 MG tablet Take 1 tablet (40 mg total) by mouth daily.  . metoprolol tartrate  (LOPRESSOR) 25 MG tablet Take 0.5 tablets (12.5 mg total) by mouth 2 (two) times daily.     Allergies:   Atorvastatin   Social History   Socioeconomic History  . Marital status: Married    Spouse name: Not on file  . Number of children: Not on file  . Years of education: Not on file  . Highest education level: Not on file  Occupational History  . Not on file  Tobacco Use  . Smoking status: Never Smoker  . Smokeless tobacco: Never Used  . Tobacco comment: rarely smoked when he did smoke   Vaping Use  . Vaping Use: Never used  Substance and Sexual Activity  . Alcohol use: No    Alcohol/week: 0.0 standard drinks  . Drug use: No  . Sexual activity: Not Currently  Other Topics Concern  . Not on file  Social History Narrative  . Not on file   Social Determinants of Health   Financial Resource Strain: Not on file  Food Insecurity: Not on file  Transportation Needs: Not on file  Physical Activity: Not on file  Stress: Not on file  Social Connections: Not on file     Family History: The patient's family history includes Alcohol abuse in his father;  Alzheimer's disease in his father; Cancer in his brother, mother, and sister; Depression in his father; Heart disease in his brother and mother; Stroke in his mother.  ROS:   Please see the history of present illness.     All other systems reviewed and are negative.  EKGs/Labs/Other Studies Reviewed:    The following studies were reviewed today: Echo 09/23/2018- IMPRESSIONS    1. The left ventricle has normal systolic function of 67-12%. The cavity  size was normal. There is mildly increased left ventricular Leontina Skidmore  thickness. Echo evidence of impaired diastolic relaxation Elevated left  ventricular end-diastolic pressure.  2. The right ventricle has normal systolic function. The cavity was  normal. There is no increase in right ventricular Eustacia Urbanek thickness.  3. The mitral valve is normal in structure.  4. The tricuspid  valve is normal in structure.  5. 26 mm Edwards Sapien 3 TAVR bioprosthesis is present.  6. Compared with the echo 11/2017, mean TAVR gradients are unchanged.  7. The pulmonic valve was normal in structure.   EKG:  EKG is  ordered today.  The ekg ordered today demonstrates NSR, SB, HR 50, LBBB  Recent Labs: No results found for requested labs within last 8760 hours.  Recent Lipid Panel    Component Value Date/Time   CHOL 155 03/18/2019 0914   TRIG 214 (H) 03/18/2019 0914   HDL 44 03/18/2019 0914   CHOLHDL 3.5 03/18/2019 0914   CHOLHDL 2.6 04/25/2007 0415   VLDL 25 04/25/2007 0415   LDLCALC 68 03/18/2019 0914    Physical Exam:    VS:  BP 124/60   Pulse (!) 52   Ht 5\' 9"  (1.753 m)   Wt 183 lb (83 kg)   BMI 27.02 kg/m     Wt Readings from Last 3 Encounters:  10/09/20 183 lb (83 kg)  07/02/20 172 lb (78 kg)  04/11/20 177 lb (80.3 kg)     GEN: Well nourished, well developed in no acute distress HEENT: Normal NECK: No JVD CARDIAC: RRR, short systolic murmur AOV, LSB,no rubs, gallops RESPIRATORY:  Clear to auscultation without rales, wheezing or rhonchi  ABDOMEN: Soft,  non-distended MUSCULOSKELETAL:  1+ LE edema; No deformity  SKIN: Warm and dry NEUROLOGIC:  Alert and oriented x 3 PSYCHIATRIC:  Normal affect   ASSESSMENT:    DOE (dyspnea on exertion) This may be from volume overload vs bradycardia.  Add Lasix 40 mg daily, decrease Lopressor to 12.5 mg BID.  Check labs- BMP, BNP, CBC. Check echo.  RTC 2 weeks for re evelaution  Sinus bradycardia HR 50 on Lopressor 25 mg BID  LBBB (left bundle branch block) Chronic  Hx of CABG CABG '96  CAD S/P percutaneous coronary angioplasty Multiple RCA PCIs- last 2013  CKD (chronic kidney disease) stage 3, GFR 30-59 ml/min (HCC) Check BMP today and at f/u  PLAN:    As above   Medication Adjustments/Labs and Tests Ordered: Current medicines are reviewed at length with the patient today.  Concerns regarding  medicines are outlined above.  Orders Placed This Encounter  Procedures  . CBC  . Basic Metabolic Panel (BMET)  . B Nat Peptide  . EKG 12-Lead  . ECHOCARDIOGRAM COMPLETE   Meds ordered this encounter  Medications  . metoprolol tartrate (LOPRESSOR) 25 MG tablet    Sig: Take 0.5 tablets (12.5 mg total) by mouth 2 (two) times daily.    Dispense:  90 tablet    Refill:  3    DOSE  CHANGE  . furosemide (LASIX) 40 MG tablet    Sig: Take 1 tablet (40 mg total) by mouth daily.    Dispense:  90 tablet    Refill:  3    Patient Instructions  Medication Instructions:  DECREASE- Metoprolol Tartrate 12.5 mg by mouth twice a day START- Furosemide(Lasix) 40 mg by mouth daily  *If you need a refill on your cardiac medications before your next appointment, please call your pharmacy*   Lab Work: BMP, BNP, CBC  If you have labs (blood work) drawn today and your tests are completely normal, you will receive your results only by: Marland Kitchen MyChart Message (if you have MyChart) OR . A paper copy in the mail If you have any lab test that is abnormal or we need to change your treatment, we will call you to review the results.   Testing/Procedures: Your physician has requested that you have an echocardiogram. Echocardiography is a painless test that uses sound waves to create images of your heart. It provides your doctor with information about the size and shape of your heart and how well your heart's chambers and valves are working. This procedure takes approximately one hour. There are no restrictions for this procedure.   Follow-Up: At [facility], you and your health needs are our priority.  As part of our continuing mission to provide you with exceptional heart care, we have created designated Provider Care Teams.  These Care Teams include your primary Cardiologist (physician) and Advanced Practice Providers (APPs -  Physician Assistants and Nurse Practitioners) who all work together to provide you  with the care you need, when you need it.  We recommend signing up for the patient portal called "MyChart".  Sign up information is provided on this After Visit Summary.  MyChart is used to connect with patients for Virtual Visits (Telemedicine).  Patients are able to view lab/test results, encounter notes, upcoming appointments, etc.  Non-urgent messages can be sent to your provider as well.   To learn more about what you can do with MyChart, go to [email or web address].    Your next appointment:   2 week(s)  The format for your next appointment:   In Person  Provider:   You may see Pixie Casino, MD or one of the following Advanced Practice Providers on your designated Care Team:    Kerin Ransom, PA-C         Signed, Kerin Ransom, [place]  10/09/2020 9:55 AM    [address]

## 2020-10-09 NOTE — Assessment & Plan Note (Signed)
This may be from volume overload vs bradycardia.  Add Lasix 40 mg daily, decrease Lopressor to 12.5 mg BID.  Check labs- BMP, BNP, CBC. Check echo.  RTC 2 weeks for re evelaution

## 2020-10-09 NOTE — Assessment & Plan Note (Signed)
CABG '96

## 2020-10-09 NOTE — Assessment & Plan Note (Signed)
Multiple RCA PCIs- last 2013

## 2020-10-09 NOTE — Assessment & Plan Note (Signed)
HR 50 on Lopressor 25 mg BID

## 2020-10-09 NOTE — Assessment & Plan Note (Signed)
Chronic. 

## 2020-10-29 ENCOUNTER — Ambulatory Visit: Payer: Medicare PPO | Admitting: Dermatology

## 2020-10-31 ENCOUNTER — Other Ambulatory Visit: Payer: Self-pay

## 2020-10-31 ENCOUNTER — Ambulatory Visit (HOSPITAL_COMMUNITY): Payer: Medicare PPO | Attending: Cardiology

## 2020-10-31 DIAGNOSIS — R0600 Dyspnea, unspecified: Secondary | ICD-10-CM | POA: Insufficient documentation

## 2020-10-31 DIAGNOSIS — R0609 Other forms of dyspnea: Secondary | ICD-10-CM

## 2020-10-31 DIAGNOSIS — Z952 Presence of prosthetic heart valve: Secondary | ICD-10-CM | POA: Insufficient documentation

## 2020-10-31 LAB — ECHOCARDIOGRAM COMPLETE
AR max vel: 1.82 cm2
AV Area VTI: 2.2 cm2
AV Area mean vel: 2.13 cm2
AV Mean grad: 9 mmHg
AV Peak grad: 19.2 mmHg
Ao pk vel: 2.19 m/s
Area-P 1/2: 2.23 cm2
S' Lateral: 3.3 cm

## 2020-10-31 NOTE — Progress Notes (Addendum)
Cardiology Clinic Note   Patient Name: [name] Date of Encounter: 11/01/2020  Primary Care Provider:  Deland Pretty, MD Primary Cardiologist:  Steven Casino, MD  Patient Profile    [name]. [place] 87 year old male presents the clinic today for follow-up evaluation of his dyspnea and fatigue.  Past Medical History    Past Medical History:  Diagnosis Date  . CAD (coronary artery disease)    a. s/p CABG in 1996 and subsequent PCI  . Carotid artery disease    a. Rt. CEA, 08/04/07, Lt. CEA 04/26/07.  . Chronic kidney disease   . CKD (chronic kidney disease) stage 2, GFR 60-89 ml/min 09/16/2011  . Coronary artery disease   . History of CVA (cerebrovascular accident)    a. 2008  . HTN (hypertension),uncontrolled 09/16/2011  . Hypertension   . LBBB (left bundle branch block)   . Melanoma ([name]) 03/28/2019   Right Temple (in situ) [facility])  . Peripheral arterial disease ([place])   . Pulmonary hypertension, mild 09/17/2011  . S/P TAVR (transcatheter aortic valve replacement)    a. 09/15/17: s/p Edwards Sapien 3 THV (size 26 mm, model # 9600TFX, serial # [id number])  . SCCA (squamous cell carcinoma) of skin 03/28/2019   Right Shoulder (curet and 5FU)  . SCCA (squamous cell carcinoma) of skin 03/28/2019   Right Top Hand (in situ) (curet and 5FU)  . Squamous cell carcinoma of skin 05/17/2018   Left Post Shoulder (well diff) (MOH's)   Past Surgical History:  Procedure Laterality Date  .  Fluid knee Right Sept. 6, 2014   Fluid removed  . CARDIAC CATHETERIZATION Left 06/10/2012   Ostial RCA 99% stenosis pre-dilation 3x40mm Angiosculpt PTCA balloon, 3.5x16mm Promus Element DES overlapping proximal stent and into the Aorto-ostium, post-dilated with4x28mm [place] Trek balloon - 99% stenosis reduced to 0%  . CARDIAC CATHETERIZATION Left 12/22/2011   Left Main-diffuse tapering 50-60% stenosis; mild-moderate diseaseof the right ventricle branch of the right coronary artery; will plan FFR of ostial RCA   . CARDIAC CATHETERIZATION Bilateral 09/16/2011   Severe 90% ostial RCA disease which is calcified, will likely need PCI to ostial RCA with calcification  . CARDIAC CATHETERIZATION  09/18/2011   Calcified ostial 90-95% proximal RCA stenosis, 3x20mm Emerge balloon predilation, 3x16mm DES PROMUS Element stent was placed, post stent dilation done utilizing a 3.25x64mm noncompliant Quantum Apex balloon, 90-95% stenosis reduced to 0%  . CEA  2008  . CORONARY ANGIOPLASTY    . CORONARY ARTERY BYPASS GRAFT  08/04/2007  . FRACTIONAL FLOW RESERVE WIRE  12/22/2011   Ostial RCA, FFR ratio-0.82, physiologically significant, 3.25x42mm Flextome Cutting balloon deployed at ~3.55mm final estimated diameter  . FRACTIONAL FLOW RESERVE WIRE Right 12/22/2011   Procedure: FRACTIONAL FLOW RESERVE WIRE;  Surgeon: Steven Casino, MD;  Location: [facility] CATH LAB;  Service: Cardiovascular;  Laterality: Right;  . LEFT AND RIGHT HEART CATHETERIZATION WITH CORONARY ANGIOGRAM Left 09/16/2011   Procedure: LEFT AND RIGHT HEART CATHETERIZATION WITH CORONARY ANGIOGRAM;  Surgeon: Steven Casino, MD;  Location: [facility] CATH LAB;  Service: Cardiovascular;  Laterality: Left;  . LEFT HEART CATHETERIZATION WITH CORONARY ANGIOGRAM N/A 06/10/2012   Procedure: LEFT HEART CATHETERIZATION WITH CORONARY ANGIOGRAM;  Surgeon: Steven Casino, MD;  Location: [facility] CATH LAB;  Service: Cardiovascular;  Laterality: N/A;  . LEFT HEART CATHETERIZATION WITH CORONARY/GRAFT ANGIOGRAM N/A 12/22/2011   Procedure: LEFT HEART CATHETERIZATION WITH Beatrix Fetters;  Surgeon: Steven Casino, MD;  Location: [facility] CATH LAB;  Service: Cardiovascular;  Laterality:  N/A;  . PERCUTANEOUS CORONARY ROTOBLATOR INTERVENTION (PCI-R)  09/18/2011   Procedure: PERCUTANEOUS CORONARY ROTOBLATOR INTERVENTION (PCI-R);  Surgeon: Troy Sine, MD;  Location: [facility] CATH LAB;  Service: Cardiovascular;;  . PERCUTANEOUS CORONARY STENT INTERVENTION (PCI-S) N/A 09/18/2011   Procedure: PERCUTANEOUS  CORONARY STENT INTERVENTION (PCI-S);  Surgeon: Troy Sine, MD;  Location: [facility] CATH LAB;  Service: Cardiovascular;  Laterality: N/A;  . RIGHT/LEFT HEART CATH AND CORONARY/GRAFT ANGIOGRAPHY N/A 07/30/2017   Procedure: RIGHT/LEFT HEART CATH AND CORONARY/GRAFT ANGIOGRAPHY;  Surgeon: Belva Crome, MD;  Location: [place] CV LAB;  Service: Cardiovascular;  Laterality: N/A;  . TEE WITHOUT CARDIOVERSION N/A 09/15/2017   Procedure: TRANSESOPHAGEAL ECHOCARDIOGRAM (TEE);  Surgeon: Burnell Blanks, MD;  Location: [place];  Service: Open Heart Surgery;  Laterality: N/A;  . TEMPORARY PACEMAKER INSERTION  09/18/2011   Procedure: TEMPORARY PACEMAKER INSERTION;  Surgeon: Troy Sine, MD;  Location: [facility] CATH LAB;  Service: Cardiovascular;;  . TRANSCATHETER AORTIC VALVE REPLACEMENT, TRANSFEMORAL N/A 09/15/2017   Procedure: TRANSCATHETER AORTIC VALVE REPLACEMENT, TRANSFEMORAL;  Surgeon: Burnell Blanks, MD;  Location: [place];  Service: Open Heart Surgery;  Laterality: N/A;    Allergies  Allergies  Allergen Reactions  . Atorvastatin Other (See Comments)    Muscle and joint pain, can tolerate rosuvastatin    History of Present Illness    [name] has a PMH of coronary artery disease, chronic renal insufficiency, TAVR 1/19, HTN, PVD, and chronic bradycardia with LBBB.  He underwent CABG in 1996.  He had multiple RCA PCI's with last being in 2013.  Echocardiogram 2/20 showed preserved LV EF with good valve function.  He is noted to have chronic bradycardia with heart rates in the 50s and LBBB.  He was seen by Kerin Ransom, PA-C on 10/09/2020.  During that time he reported increasing shortness of breath and fatigue with exertion over the last 6 months.  He reported his symptoms had gradually increased.  At the time of the visit he reported he did not feel like he could do anything.  Previously he reported being active.  He denied syncope.  He was noted to have lower extremity edema.  BNP, CBC,  follow-up echocardiogram, Lopressor was decreased to 12.5 mg twice daily.  Echocardiogram 10/31/2020 showed normal LVEF, G2 DD, and mildly dilated left atria with severely dilated right atria, trivial mitral valve regurgitation, and mild-moderate tricuspid valve regurgitation, TAVR in correct position and functioning normally.  He presents the clinic today for follow-up evaluation states he is feeling much better.  He noticed increased activity tolerance with his reduction in metoprolol.  He reports he stays very physically active at home cutting grass and doing chores around his house.  We reviewed his echocardiogram and he expressed understanding.  He reports he just had labs drawn at [facility].  I will request his labs.  We will give him salty 6 diet sheet, have him maintain his physical activity and follow-up with Dr. Debara Pickett in 3 months.  Today he denies chest pain, shortness of breath, lower extremity edema, fatigue, palpitations, melena, hematuria, hemoptysis, diaphoresis, weakness, presyncope, syncope, orthopnea, and PND.   Home Medications    Prior to Admission medications   Medication Sig Start Date End Date Taking? Authorizing Provider  amLODipine (NORVASC) 10 MG tablet Take 1 tablet (10 mg total) by mouth at bedtime. 05/05/13   Steven Casino, MD  aspirin EC 81 MG tablet Take 81 mg by mouth at bedtime.     [provider]  clopidogrel (PLAVIX) 75 MG tablet TAKE 1 TABLET BY MOUTH EVERY DAY 08/29/20   [name], Nadean Corwin, MD  furosemide (LASIX) 40 MG tablet Take 1 tablet (40 mg total) by mouth daily. 10/09/20 01/07/21  Erlene Quan, PA-C  isosorbide mononitrate (IMDUR) 30 MG 24 hr tablet Take 30 mg by mouth 3 (three) times daily.    [provider]  metoprolol tartrate (LOPRESSOR) 25 MG tablet Take 0.5 tablets (12.5 mg total) by mouth 2 (two) times daily. 10/09/20 01/07/21  Erlene Quan, PA-C  niacin (NIASPAN) 500 MG CR tablet Take 500 mg by mouth at bedtime.     [provider]  nitroGLYCERIN (NITROSTAT) 0.4 MG SL tablet Place 1 tablet (0.4 mg total) under the tongue every 5 (five) minutes x 3 doses as needed. 04/16/17   HiltyNadean Corwin, MD  polyethylene glycol powder (GLYCOLAX/MIRALAX) 17 GM/SCOOP powder 1/2 scoop  every other night 05/18/19   [provider]  ranolazine (RANEXA) 500 MG 12 hr tablet TAKE 1 TABLET BY MOUTH TWICE A DAY 05/08/20   [name], Nadean Corwin, MD  rosuvastatin (CRESTOR) 40 MG tablet TAKE 1 TABLET BY MOUTH EVERYDAY AT BEDTIME 10/28/19   [name], Nadean Corwin, MD  tacrolimus (PROTOPIC) 0.1 % ointment APPLY TOPICALLY IN THE MORNING AND AT BEDTIME. 09/21/20   Lavonna Monarch, MD    Family History    Family History  Problem Relation Age of Onset  . Cancer Mother   . Heart disease Mother   . Stroke Mother   . Alcohol abuse Father   . Depression Father   . Alzheimer's disease Father   . Heart disease Brother        Heart bypass in 1996  . Cancer Brother        Prostate  . Cancer Sister    He indicated that his mother is deceased. He indicated that his father is deceased. He indicated that the status of his sister is unknown. He indicated that his brother is deceased. He indicated that his maternal grandmother is deceased. He indicated that his maternal grandfather is deceased. He indicated that his paternal grandmother is deceased. He indicated that his paternal grandfather is deceased.  Social History    Social History   Socioeconomic History  . Marital status: Married    Spouse name: Not on file  . Number of children: Not on file  . Years of education: Not on file  . Highest education level: Not on file  Occupational History  . Not on file  Tobacco Use  . Smoking status: Never Smoker  . Smokeless tobacco: Never Used  . Tobacco comment: rarely smoked when he did smoke   Vaping Use  . Vaping Use: Never used  Substance and Sexual Activity  . Alcohol use: No    Alcohol/week: 0.0 standard drinks  . Drug use: No   . Sexual activity: Not Currently  Other Topics Concern  . Not on file  Social History Narrative  . Not on file   Social Determinants of Health   Financial Resource Strain: Not on file  Food Insecurity: Not on file  Transportation Needs: Not on file  Physical Activity: Not on file  Stress: Not on file  Social Connections: Not on file  Intimate Partner Violence: Not on file     Review of Systems    General:  No chills, fever, night sweats or weight changes.  Cardiovascular:  No chest pain, dyspnea on exertion, edema, orthopnea, palpitations, paroxysmal nocturnal  dyspnea. Dermatological: No rash, lesions/masses Respiratory: No cough, dyspnea Urologic: No hematuria, dysuria Abdominal:   No nausea, vomiting, diarrhea, bright red blood per rectum, melena, or hematemesis Neurologic:  No visual changes, wkns, changes in mental status. All other systems reviewed and are otherwise negative except as noted above.  Physical Exam    VS:  BP (!) 110/52   Pulse 61   Ht 5\' 9"  (1.753 m)   Wt 170 lb 6.4 oz (77.3 kg)   SpO2 97%   BMI 25.16 kg/m  , BMI Body mass index is 25.16 kg/m. GEN: Well nourished, well developed, in no acute distress. HEENT: normal. Neck: Supple, no JVD, carotid bruits, or masses. Cardiac: RRR, no murmurs, rubs, or gallops. No clubbing, cyanosis, edema.  Radials/DP/PT 2+ and equal bilaterally.  Respiratory:  Respirations regular and unlabored, clear to auscultation bilaterally. GI: Soft, nontender, nondistended, BS + x 4. MS: no deformity or atrophy. Skin: warm and dry, no rash. Neuro:  Strength and sensation are intact. Psych: Normal affect.  Accessory Clinical Findings    Recent Labs: No results found for requested labs within last 8760 hours.   Recent Lipid Panel    Component Value Date/Time   CHOL 155 03/18/2019 0914   TRIG 214 (H) 03/18/2019 0914   HDL 44 03/18/2019 0914   CHOLHDL 3.5 03/18/2019 0914   CHOLHDL 2.6 04/25/2007 0415   VLDL 25  04/25/2007 0415   LDLCALC 68 03/18/2019 0914    ECG personally reviewed by me today-none today.  Echocardiogram 09/23/2018 IMPRESSIONS    1. The left ventricle has normal systolic function of 53-97%. The cavity  size was normal. There is mildly increased left ventricular wall  thickness. Echo evidence of impaired diastolic relaxation Elevated left  ventricular end-diastolic pressure.  2. The right ventricle has normal systolic function. The cavity was  normal. There is no increase in right ventricular wall thickness.  3. The mitral valve is normal in structure.  4. The tricuspid valve is normal in structure.  5. 26 mm Edwards Sapien 3 TAVR bioprosthesis is present.  6. Compared with the echo 11/2017, mean TAVR gradients are unchanged.  7. The pulmonic valve was normal in structure.    Echocardiogram 10/31/2020  IMPRESSIONS    1. Left ventricular ejection fraction, by estimation, is 55 to 60%. The  left ventricle has normal function. Abnormal (paradoxical) septal motion,  consistent with left bundle branch block. There is moderate left  ventricular hypertrophy. Left ventricular  diastolic parameters are consistent with Grade II diastolic dysfunction  (pseudonormalization). Elevated left atrial pressure.  2. Right ventricular systolic function is mildly reduced. The right  ventricular size is mildly enlarged. There is normal pulmonary artery  systolic pressure. The estimated right ventricular systolic pressure is  67.3 mmHg.  3. Left atrial size was mildly dilated.  4. Right atrial size was severely dilated.  5. The mitral valve is normal in structure. Trivial mitral valve  regurgitation.  6. Tricuspid valve regurgitation is mild to moderate.  7. There is a 26 mm Sapien prosthetic, stented (TAVR) valve present in  the aortic position. Aortic valve regurgitation is not visualized. Echo  findings are consistent with normal structure and function of the aortic   valve prosthesis. Aortic valve mean  gradient measures 9.0 mmHg.  8. The inferior vena cava is normal in size with greater than 50%  respiratory variability, suggesting right atrial pressure of 3 mmHg.  Assessment & Plan   1.  DOE-much improved today.  Euvolemic.  Echocardiogram 10/31/2020 showed normal LVEF, G2 DD, and mildly dilated left atria with severely dilated right atria, trivial mitral valve regurgitation, and mild-moderate tricuspid valve regurgitation, TAVR in correct position and functioning normally. Continue furosemide as needed-for weight increase of 3 pounds overnight or 5 pounds in 1 week. Heart healthy low-sodium diet-salty 6 given Increase physical activity as tolerated  Sinus bradycardia-heart rate today 61.  Reports less fatigue with decrease metoprolol. Continue metoprolol Heart healthy low-sodium diet Maintain physical activity  Left bundle branch block-chronic. Continue to monitor  Coronary artery disease-no chest pain today status post CABG 1996.  Multiple RCA PCI's with his last being in 2013. Continue metoprolol, aspirin, amlodipine, Plavix, Imdur, nitro, rosuvastatin Heart healthy low-sodium diet-salty 6 given Increase physical activity as tolerated  CKD stage III-creatinine 1.72 on 03/18/2019 Follows with [facility] Request 10/25/2020 labs  Disposition: Follow-up with Dr. Debara Pickett in 3 months.  Jossie Ng. Nasim Habeeb NP-C    11/01/2020, 8:58 AM [address] Office [phone] Fax [phone]  Notice: This dictation was prepared with Dragon dictation along with smaller phrase technology. Any transcriptional errors that result from this process are unintentional and may not be corrected upon review.  I spent 12 minutes examining this patient, reviewing medications, and using patient centered shared decision making involving her cardiac care.  Prior to her visit I spent greater than 20 minutes  reviewing her past medical history,  medications, and prior cardiac tests.

## 2020-11-01 ENCOUNTER — Encounter: Payer: Self-pay | Admitting: General Practice

## 2020-11-01 ENCOUNTER — Ambulatory Visit: Payer: Medicare PPO | Admitting: General Practice

## 2020-11-01 VITALS — BP 110/52 | HR 61 | Ht 69.0 in | Wt 170.4 lb

## 2020-11-01 DIAGNOSIS — I447 Left bundle-branch block, unspecified: Secondary | ICD-10-CM | POA: Diagnosis not present

## 2020-11-01 DIAGNOSIS — Z951 Presence of aortocoronary bypass graft: Secondary | ICD-10-CM | POA: Diagnosis not present

## 2020-11-01 DIAGNOSIS — R001 Bradycardia, unspecified: Secondary | ICD-10-CM

## 2020-11-01 DIAGNOSIS — R0600 Dyspnea, unspecified: Secondary | ICD-10-CM

## 2020-11-01 DIAGNOSIS — N1831 Chronic kidney disease, stage 3a: Secondary | ICD-10-CM

## 2020-11-01 DIAGNOSIS — R0609 Other forms of dyspnea: Secondary | ICD-10-CM

## 2020-11-01 DIAGNOSIS — Z952 Presence of prosthetic heart valve: Secondary | ICD-10-CM

## 2020-11-01 NOTE — Patient Instructions (Signed)
Medication Instructions:  The current medical regimen is effective;  continue present plan and medications as directed. Please refer to the Current Medication list given to you today.  *If you need a refill on your cardiac medications before your next appointment, please call your pharmacy*  Lab Work:   Testing/Procedures:  CALL [facility]  NONE  Special Instructions PLEASE READ AND FOLLOW SALTY 6-ATTACHED-1,800mg  daily  PLEASE MAINTAIN PHYSICAL ACTIVITY AS TOLERATED  Follow-Up: Your next appointment:  3 month(s) In Person with K. Mali [name], MD OR IF UNAVAILABLE [place], FNP-C  At [facility], you and your health needs are our priority.  As part of our continuing mission to provide you with exceptional heart care, we have created designated Provider Care Teams.  These Care Teams include your primary Cardiologist (physician) and Advanced Practice Providers (APPs -  Physician Assistants and Nurse Practitioners) who all work together to provide you with the care you need, when you need it.            6 SALTY THINGS TO AVOID     1,800MG  DAILY

## 2020-11-20 ENCOUNTER — Other Ambulatory Visit: Payer: Self-pay | Admitting: Internal Medicine

## 2020-11-21 ENCOUNTER — Encounter: Payer: Self-pay | Admitting: Dermatology

## 2020-11-21 ENCOUNTER — Other Ambulatory Visit: Payer: Self-pay

## 2020-11-21 ENCOUNTER — Ambulatory Visit: Payer: Medicare PPO | Admitting: Dermatology

## 2020-11-21 DIAGNOSIS — Z86006 Personal history of melanoma in-situ: Secondary | ICD-10-CM

## 2020-11-21 DIAGNOSIS — Z8589 Personal history of malignant neoplasm of other organs and systems: Secondary | ICD-10-CM

## 2020-11-21 DIAGNOSIS — C44521 Squamous cell carcinoma of skin of breast: Secondary | ICD-10-CM | POA: Diagnosis not present

## 2020-11-21 DIAGNOSIS — C4492 Squamous cell carcinoma of skin, unspecified: Secondary | ICD-10-CM

## 2020-11-21 DIAGNOSIS — C4482 Squamous cell carcinoma of overlapping sites of skin: Secondary | ICD-10-CM | POA: Diagnosis not present

## 2020-11-21 DIAGNOSIS — Z85828 Personal history of other malignant neoplasm of skin: Secondary | ICD-10-CM | POA: Diagnosis not present

## 2020-11-21 DIAGNOSIS — D485 Neoplasm of uncertain behavior of skin: Secondary | ICD-10-CM

## 2020-11-21 DIAGNOSIS — L2084 Intrinsic (allergic) eczema: Secondary | ICD-10-CM

## 2020-11-21 DIAGNOSIS — L570 Actinic keratosis: Secondary | ICD-10-CM

## 2020-11-21 DIAGNOSIS — C44321 Squamous cell carcinoma of skin of nose: Secondary | ICD-10-CM

## 2020-11-21 NOTE — Patient Instructions (Signed)

## 2020-12-03 ENCOUNTER — Encounter: Payer: Self-pay | Admitting: Dermatology

## 2020-12-03 NOTE — Progress Notes (Signed)
Follow-Up Visit   Subjective  [name] is a 87 y.o. male who presents for the following: Follow-up (Patent stopped the dupixent injections and it made his eyes swell, he is using curel and the cream is working) and Annual Exam (Patient has history of scc x3 and MM X1 RIGHT TEMPLE).  Rapid growth of new spot on back of left ear plus other crust to check Location:  Duration:  Quality:  Associated Signs/Symptoms: Modifying Factors:  Severity:  Timing: Context:   Objective  Well appearing patient in no apparent distress; mood and affect are within normal limits. Objective  Left Shoulder - Posterior, Right Temple: Multiple.  No sign recurrence  Objective  Right Temple: No repigmentation, no regional adenopathy.  Objective  Right Cymba (2): Hornlike pink 2 to 4 mm crusts  Objective  Mid Back: Currently eczema is patchy and relatively mild.  Did not tolerate Dupixent (eyes swelled).  Objective  Right Breast: Heaped up 6 mm crust     Objective  Mid Supratip of Nose: Focally eroded 6 mm pink papule     Objective  Left Postauricular Sulcus: Centrally eroded crater form 1.2 cm nodule on the back of his left mid ear       All skin waist up examined.   Assessment & Plan    History of squamous cell carcinoma (2) Left Shoulder - Posterior; Right Temple  Yearly skin check.,  Recheck as needed change  History of melanoma in situ Right Temple  Recheck as needed change.  AK (actinic keratosis) (2) Right Cymba  Destruction of lesion - Right Cymba Complexity: simple   Destruction method: cryotherapy   Informed consent: discussed and consent obtained   Timeout:  patient name, date of birth, surgical site, and procedure verified Lesion destroyed using liquid nitrogen: Yes   Cryotherapy cycles:  3 Outcome: patient tolerated procedure well with no complications   Post-procedure details: wound care instructions given    Intrinsic eczema Mid  Back  Will call if eczema significantly flares.  Squamous cell carcinoma of skin (3) Right Breast  Skin / nail biopsy Type of biopsy: tangential   Informed consent: discussed and consent obtained   Timeout: patient name, date of birth, surgical site, and procedure verified   Procedure prep:  Patient was prepped and draped in usual sterile fashion (Non sterile) Prep type:  Chlorhexidine Anesthesia: the lesion was anesthetized in a standard fashion   Anesthetic:  1% lidocaine w/ epinephrine 1-100,000 local infiltration Instrument used: flexible razor blade   Outcome: patient tolerated procedure well   Post-procedure details: wound care instructions given    Destruction of lesion Complexity: simple   Destruction method: electrodesiccation and curettage   Informed consent: discussed and consent obtained   Timeout:  patient name, date of birth, surgical site, and procedure verified Anesthesia: the lesion was anesthetized in a standard fashion   Anesthetic:  1% lidocaine w/ epinephrine 1-100,000 local infiltration Curettage performed in three different directions: Yes   Electrodesiccation performed over the curetted area: Yes   Curettage cycles:  3 Lesion length (cm):  1 Lesion width (cm):  1 Margin per side (cm):  0 Final wound size (cm):  1 Hemostasis achieved with:  aluminum chloride Outcome: patient tolerated procedure well with no complications   Post-procedure details: wound care instructions given    Specimen 1 - Surgical pathology Differential Diagnosis: wart/bcc/scc Curetx1 cautery Check Margins: No  Mid Supratip of Nose  Skin / nail biopsy Type of biopsy: tangential  Informed consent: discussed and consent obtained   Timeout: patient name, date of birth, surgical site, and procedure verified   Procedure prep:  Patient was prepped and draped in usual sterile fashion (Non sterile) Prep type:  Chlorhexidine Anesthesia: the lesion was anesthetized in a standard  fashion   Anesthetic:  1% lidocaine w/ epinephrine 1-100,000 local infiltration Instrument used: flexible razor blade   Hemostasis achieved with: electrodesiccation   Outcome: patient tolerated procedure well   Post-procedure details: wound care instructions given    Destruction of lesion Complexity: simple   Destruction method: electrodesiccation and curettage   Informed consent: discussed and consent obtained   Timeout:  patient name, date of birth, surgical site, and procedure verified Anesthesia: the lesion was anesthetized in a standard fashion   Anesthetic:  1% lidocaine w/ epinephrine 1-100,000 local infiltration Curettage performed in three different directions: Yes   Electrodesiccation performed over the curetted area: Yes   Curettage cycles:  3 Lesion length (cm):  0.9 Lesion width (cm):  0.9 Margin per side (cm):  0 Final wound size (cm):  0.9 Hemostasis achieved with:  aluminum chloride Outcome: patient tolerated procedure well with no complications   Post-procedure details: wound care instructions given    Specimen 2 - Surgical pathology Differential Diagnosis: bcc vs scc Curetx1 cautery Check Margins: No  Left Postauricular Sulcus  Skin / nail biopsy Type of biopsy: tangential   Informed consent: discussed and consent obtained   Timeout: patient name, date of birth, surgical site, and procedure verified   Procedure prep:  Patient was prepped and draped in usual sterile fashion (Non sterile) Prep type:  Chlorhexidine Anesthesia: the lesion was anesthetized in a standard fashion   Anesthetic:  1% lidocaine w/ epinephrine 1-100,000 local infiltration Instrument used: flexible razor blade   Outcome: patient tolerated procedure well   Post-procedure details: wound care instructions given    Destruction of lesion Complexity: simple   Destruction method: electrodesiccation and curettage   Informed consent: discussed and consent obtained   Timeout:  patient name,  date of birth, surgical site, and procedure verified Anesthesia: the lesion was anesthetized in a standard fashion   Anesthetic:  1% lidocaine w/ epinephrine 1-100,000 local infiltration Curettage performed in three different directions: Yes   Electrodesiccation performed over the curetted area: Yes   Curettage cycles:  3 Lesion length (cm):  1.2 Lesion width (cm):  1.2 Margin per side (cm):  0 Final wound size (cm):  1.2 Hemostasis achieved with:  aluminum chloride Outcome: patient tolerated procedure well with no complications   Post-procedure details: wound care instructions given    Specimen 3 - Surgical pathology Differential Diagnosis: bcc vs scc Curetx1 cautery  Check Margins: No  After shave biopsy the base was curetted and cauterized for installation.      I, Lavonna Monarch, MD, have reviewed all documentation for this visit.  The documentation on 12/03/20 for the exam, diagnosis, procedures, and orders are all accurate and complete.

## 2020-12-04 ENCOUNTER — Telehealth: Payer: Self-pay | Admitting: *Deleted

## 2020-12-04 NOTE — Telephone Encounter (Signed)
Path to patient. Made a 3 month follow up per Dr.Tafeen to check on lesions.

## 2020-12-04 NOTE — Telephone Encounter (Signed)
He called back @2 :08

## 2020-12-04 NOTE — Telephone Encounter (Signed)
-----   Message from Lavonna Monarch, MD sent at 11/30/2020  6:38 AM EDT ----- Inform Mr. Gibbon that all 3 biopsies showed low risk nonmelanoma skin cancer which may have been cured with the biopsy.  I would like to take a look at all the spots 3 months and do schedule this as a 30-minute surgery in case more needs to be done.

## 2020-12-04 NOTE — Telephone Encounter (Signed)
Left message for patient to call.

## 2021-02-05 ENCOUNTER — Ambulatory Visit (INDEPENDENT_AMBULATORY_CARE_PROVIDER_SITE_OTHER): Payer: Medicare PPO | Admitting: Internal Medicine

## 2021-02-05 ENCOUNTER — Encounter: Payer: Self-pay | Admitting: Internal Medicine

## 2021-02-05 ENCOUNTER — Other Ambulatory Visit: Payer: Self-pay

## 2021-02-05 VITALS — BP 120/60 | HR 52 | Ht 69.0 in | Wt 167.4 lb

## 2021-02-05 DIAGNOSIS — Z8673 Personal history of transient ischemic attack (TIA), and cerebral infarction without residual deficits: Secondary | ICD-10-CM

## 2021-02-05 DIAGNOSIS — I447 Left bundle-branch block, unspecified: Secondary | ICD-10-CM | POA: Diagnosis not present

## 2021-02-05 DIAGNOSIS — Z951 Presence of aortocoronary bypass graft: Secondary | ICD-10-CM

## 2021-02-05 DIAGNOSIS — Z952 Presence of prosthetic heart valve: Secondary | ICD-10-CM

## 2021-02-05 DIAGNOSIS — I5032 Chronic diastolic (congestive) heart failure: Secondary | ICD-10-CM

## 2021-02-05 NOTE — Progress Notes (Signed)
OFFICE NOTE  Chief Complaint:  Follow-up  Primary Care Physician: Deland Pretty, MD  HPI:  [name] is a 87 year old gentleman who had unfortunate aggressive restenosis of an RCA lesion in the past, recently restented. He presented with worsening anginal symptoms and medical therapy was recommended in November. He was scheduled for a nuclear stress test which he underwent on July 21, 2012, and this was negative for ischemia. Recently, he had been experiencing some soreness in his left chest wall which has been going on and off for about a month. He is not experiencing the shortness of breath that he previously experienced with his angina, especially when walking up hills. He noted that he had an episode of a fall when he was trying to lift a very heavy deer, landed on his back and perhaps strained or sprained intercostal muscles or something in the chest wall. Based on his symptoms he underwent stress testing which indicated ischemia and he underwent a repeat coronary intervention to the RCA.   At his last office visit he was doing fairly well, without worsening shortness of breath or chest discomfort.  She did make it through the [place] this [place] but noted a small increase in shortness of breath with activity. He's also had weight gain recently and some lower extremity swelling. He says his symptoms are somewhat reminiscent of the symptoms that required prior coronary intervention about one year ago, but not as significant. Of note, he does have moderate to severe aortic stenosis which were touching very closely and a history of diastolic dysfunction plus or minus heart failure.  [name] returns today and is feeling quite well. He denies any chest pain or shortness of breath. He told me of an episode recently where he helped deliver car and had to walk back home. Walked about 2 half miles during the middle of the day in the heat and had no problems or shortness of breath or chest  discomfort. He actually is feeling the best he has in some time.  [name] back in the office today. He continues to be active. He's been hunting recently and doing a lot of exertion without any worsening shortness of breath or chest pain. He recently had problems with some pain in his legs which he attributed to combination of Crestor and that he had. He discontinued his area and wean down his Crestor. He is currently getting 40 mg tablets and breaking them into thirds. He's taking one third of a 40 mg tablet are roughly 13 mg daily which she says is tolerable. His most recent lipid profile from September 2015 shows a total cholesterol 118, temperature 31, HDL 59 and LDL of 43 which is very low. He should be able to tolerate the decrease in his Crestor and still be at target numbers. He did have a lipoma profile and MR in March 2015. I believe there was an air in triglycerides which read over 700, but LDL-C was less than 100, LDL-P was 700 and generally showed a very favorable profile.  [name] returns today in the office for follow-up. Overall he reports doing fairly well. He continues to be active during the summer denies any chest pain or worsening shortness of breath. He has follow-up of his carotid endarterectomies in November. We are following him for aortic stenosis and he does not seem to be symptomatic with that.  [name] back in the office today for follow-up. He reports that  he has developed again some exertional chest pain over the past few weeks. He feels like a soreness in the left chest wall, but it is also deep inside. Can come at rest, but worse with exertion. There is some exertional dyspnea as well. He has moderate AS by his most recent echo in 03/2015.   [name] returns today for follow-up. He underwent nuclear stress testing which was negative for ischemia. EF was normal at 58%. He reports his chest discomfort is gone away. He is now getting over a chest cold. I suspect some  of his pain was musculoskeletal.  04/14/2016  [name] was seen back today in the office for follow-up. He reports that he is doing very well denies any shortness of breath or chest pain. He is very physically active. In February he underwent nuclear stress testing which was negative for ischemia. We are following moderate aortic stenosis which was seen on his echo last year. He does have stage III chronic kidney disease as well. He's followed by vein and vascular surgery for bilateral carotid endarterectomies. Blood pressure is well-controlled today. Recent lipid profile was within normal limits and is followed by Dr. [name].  10/13/2016  [name] was seen again in follow-up today. He reports feeling "scaringly well". He says he has not felt this well on long time. 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 He denies any chest pain or worsening shortness of breath. He did have an echocardiogram which showed moderate aortic stenosis. Continue to follow that as it is likely to progress despite adequate control of blood pressure and cholesterol. He also has carotid artery disease and is followed by vein and vascular surgery.  04/16/2017  [name] returns today for follow-up. He denies any new chest pain or worsening shortness of breath. His blood pressures been well controlled. Where continue to follow his aortic stenosis and he has an upcoming echo in September. He recently changed primary care providers to Dr. [name], since Dr. [name] has retired. He denies any anginal chest pain.  07/17/2017  [name] was seen today in follow-up.  He says over the past several months he has had some increasing shortness of breath, chest pain and fatigue.  In September he had a repeat echo which showed a stable aortic valve gradient and moderate aortic stenosis.  He has a history of recurrent coronary artery disease requiring multiple  coronary interventions.  Up until recently he has done well without anginal symptoms.  He is noted recently however while hunting or doing other activities requiring exertion, that has had more frequent chest pain, shortness of breath and some fatigue.  He his primary care provider increased his isosorbide up to 90 mg twice daily.  Despite this change is not noted an improvement in his symptoms.  01/05/2018  [name] returns today for follow-up.  He recently called in the office and was reporting significant dyspnea.  This started one morning however by the afternoon had totally resolved.  He has had no further symptoms.  Recently I referred him for evaluation of aortic stenosis and ultimately he was determined to be a candidate for TAVR.  He underwent TAVR on 09/15/2017 with Dr. Angelena Form and Dr. Cyndia Bent.  He had placement of an Edwards Sapien 3 valve (26 mm).  Other than the one episode of shortness of breath, which was reevaluated by a limited echo that showed normal  valve gradients and stable postprocedural valve, he has done very well with marked improvement in his symptoms including improvement in exercise tolerance.  He has not had significant follow-up with his primary care provider since Dr. [name] retired.  He has not had additional lipid testing.  03/04/2018  [name] is seen today in follow-up.  He says that he has had some dyspnea but only with marked exertion.  He denies any chest pain.  He is done clinically very well after TAVR in January 2019.  He had an Edwards Sapien valve and repeat echoes have shown low gradients.  He has good energy level.  He did have repeat lipid testing recently from his PCP which showed an improved total cholesterol 158, HDL 48, LDL 65 and triglycerides of 227.  Given his persistently elevated triglycerides and coronary disease he may also be candidate to add Vascepa.  We discussed the importance of low saturated fat diet.  He also has a history of PAD and has had  bilateral carotid endarterectomies.  03/17/2019  [name] is seen today for in office follow-up.  Recently he is noted some progressive shortness of breath with exertion and chest discomfort.  This is somewhat reminiscent of symptoms he has had in the past.  This seems to be somewhat cyclical as he is had a number of episodes.  Fortunately last year he underwent TAVR and has had a good result with that.  He also has known coronary disease as previously mentioned with no real stenting option to the right coronary.  I have tried to manage that medically.  07/22/2019  [name] returns today for follow-up.  He reports his chest pain has resolved.  In fact he says that the Ranexa was probably the best medication I have ever given him.  He has had significant improvement in his quality of life and ability to do stuff without anginal symptoms.  This suggest that likely his right coronary artery disease is significant however as previously mentioned not amenable to PCI.  His TAVR valve is been stable.  Overall he feels well.  He has been active and continues to do hunting as well as a lot of house chores.  04/11/2020  [name] is seen today in follow-up.  He denies any anginal symptoms.  He recently was seen by nephrology for an elevated creatinine which is being followed.  He had TAVR in 2019 and echo as of September 23, 2018 showed a stable valve gradient.  He denies any worsening shortness of breath.  02/05/2021  [name] is seen today in follow-up.  He had been seen a few months ago for shortness of breath.  A repeat echo was ordered which showed normal systolic function and a stable TAVR gradient.  He was also having some anginal symptoms.  He has been on Ranexa which she says thinks helps him a lot but does have side effects of constipation.  PMHx:  Past Medical History:  Diagnosis Date   CAD (coronary artery disease)    a. s/p CABG in 1996 and subsequent PCI   Carotid artery disease    a. Rt. CEA,  08/04/07, Lt. CEA 04/26/07.   Chronic kidney disease    CKD (chronic kidney disease) stage 2, GFR 60-89 ml/min 09/16/2011   Coronary artery disease    History of CVA (cerebrovascular accident)    a. 2008   HTN (hypertension),uncontrolled 09/16/2011   Hypertension    LBBB (left bundle branch block)    Melanoma (  [place]) 03/28/2019   Right [place] (in situ) [facility])   Peripheral arterial disease ([place])    Pulmonary hypertension, mild 09/17/2011   S/P TAVR (transcatheter aortic valve replacement)    a. 09/15/17: s/p Edwards Sapien 3 THV (size 26 mm, model # 9600TFX, serial # [id number])   SCCA (squamous cell carcinoma) of skin 03/28/2019   Right Shoulder (curet and 5FU)   SCCA (squamous cell carcinoma) of skin 03/28/2019   Right Top Hand (in situ) (curet and 5FU)   Squamous cell carcinoma of skin 05/17/2018   Left Post Shoulder (well diff) ([facility])    Past Surgical History:  Procedure Laterality Date    Fluid knee Right Sept. 6, 2014   Fluid removed   CARDIAC CATHETERIZATION Left 06/10/2012   Ostial RCA 99% stenosis pre-dilation 3x46mm Angiosculpt PTCA balloon, 3.5x85mm Promus Element DES overlapping proximal stent and into the Aorto-ostium, post-dilated with4x24mm [place] balloon - 99% stenosis reduced to 0%   CARDIAC CATHETERIZATION Left 12/22/2011   Left Main-diffuse tapering 50-60% stenosis; mild-moderate diseaseof the right ventricle branch of the right coronary artery; will plan FFR of ostial RCA   CARDIAC CATHETERIZATION Bilateral 09/16/2011   Severe 90% ostial RCA disease which is calcified, will likely need PCI to ostial RCA with calcification   CARDIAC CATHETERIZATION  09/18/2011   Calcified ostial 90-95% proximal RCA stenosis, 3x62mm Emerge balloon predilation, 3x1mm DES PROMUS Element stent was placed, post stent dilation done utilizing a 3.25x49mm noncompliant Quantum Apex balloon, 90-95% stenosis reduced to 0%   CEA  2008   CORONARY ANGIOPLASTY     CORONARY ARTERY BYPASS GRAFT  08/04/2007    FRACTIONAL FLOW RESERVE WIRE  12/22/2011   Ostial RCA, FFR ratio-0.82, physiologically significant, 3.25x20mm Fle[place]ome [place] balloon deployed at ~3.80mm final estimated diameter   FRACTIONAL FLOW RESERVE WIRE Right 12/22/2011   Procedure: FRACTIONAL FLOW RESERVE WIRE;  Surgeon: Pixie Casino, MD;  Location[facility] CATH LAB;  Service: Cardiovascular;  Laterality: Right;   LEFT AND RIGHT HEART CATHETERIZATION WITH CORONARY ANGIOGRAM Left 09/16/2011   Procedure: LEFT AND RIGHT HEART CATHETERIZATION WITH CORONARY ANGIOGRAM;  Surgeon: Pixie Casino, MD;  Location: [facility] CATH LAB;  Service: Cardiovascular;  Laterality: Left;   LEFT HEART CATHETERIZATION WITH CORONARY ANGIOGRAM N/A 06/10/2012   Procedure: LEFT HEART CATHETERIZATION WITH CORONARY ANGIOGRAM;  Surgeon: Pixie Casino, MD;  Location: [facility] CATH LAB;  Service: Cardiovascular;  Laterality: N/A;   LEFT HEART CATHETERIZATION WITH CORONARY/GRAFT ANGIOGRAM N/A 12/22/2011   Procedure: LEFT HEART CATHETERIZATION WITH Beatrix Fetters;  Surgeon: Pixie Casino, MD;  Location: [facility] CATH LAB;  Service: Cardiovascular;  Laterality: N/A;   PERCUTANEOUS CORONARY ROTOBLATOR INTERVENTION (PCI-R)  09/18/2011   Procedure: PERCUTANEOUS CORONARY ROTOBLATOR INTERVENTION (PCI-R);  Surgeon: Troy Sine, MD;  Location: [facility] CATH LAB;  Service: Cardiovascular;;   PERCUTANEOUS CORONARY STENT INTERVENTION (PCI-S) N/A 09/18/2011   Procedure: PERCUTANEOUS CORONARY STENT INTERVENTION (PCI-S);  Surgeon: Troy Sine, MD;  Location: [facility] CATH LAB;  Service: Cardiovascular;  Laterality: N/A;   RIGHT/LEFT HEART CATH AND CORONARY/GRAFT ANGIOGRAPHY N/A 07/30/2017   Procedure: RIGHT/LEFT HEART CATH AND CORONARY/GRAFT ANGIOGRAPHY;  Surgeon: Belva Crome, MD;  Location: [place] CV LAB;  Service: Cardiovascular;  Laterality: N/A;   TEE WITHOUT CARDIOVERSION N/A 09/15/2017   Procedure: TRANSESOPHAGEAL ECHOCARDIOGRAM (TEE);  Surgeon: Burnell Blanks, MD;  Location: [place];  Service: Open Heart Surgery;  Laterality: N/A;   TEMPORARY PACEMAKER INSERTION  09/18/2011   Procedure: TEMPORARY PACEMAKER INSERTION;  Surgeon: Troy Sine,  MD;  Location: [place] CATH LAB;  Service: Cardiovascular;;   TRANSCATHETER AORTIC VALVE REPLACEMENT, TRANSFEMORAL N/A 09/15/2017   Procedure: TRANSCATHETER AORTIC VALVE REPLACEMENT, TRANSFEMORAL;  Surgeon: Burnell Blanks, MD;  Location: [place];  Service: Open Heart Surgery;  Laterality: N/A;    FAMHx:  Family History  Problem Relation Age of Onset   Cancer Mother    Heart disease Mother    Stroke Mother    Alcohol abuse Father    Depression Father    Alzheimer's disease Father    Heart disease Brother        Heart bypass in 38   Cancer Brother        Prostate   Cancer Sister     SOCHx:   reports that he has never smoked. He has never used smokeless tobacco. He reports that he does not drink alcohol and does not use drugs.  ALLERGIES:  Allergies  Allergen Reactions   Atorvastatin Other (See Comments)    Muscle and joint pain, can tolerate rosuvastatin    ROS: Pertinent items noted in HPI and remainder of comprehensive ROS otherwise negative.  HOME MEDS: Current Outpatient Medications  Medication Sig Dispense Refill   amLODipine (NORVASC) 10 MG tablet Take 1 tablet (10 mg total) by mouth at bedtime. 30 tablet 6   aspirin EC 81 MG tablet Take 81 mg by mouth at bedtime.      clopidogrel (PLAVIX) 75 MG tablet TAKE 1 TABLET BY MOUTH EVERY DAY 90 tablet 2   furosemide (LASIX) 40 MG tablet Take 1 tablet (40 mg total) by mouth daily. 90 tablet 3   isosorbide mononitrate (IMDUR) 30 MG 24 hr tablet Take 30 mg by mouth 3 (three) times daily.     metoprolol tartrate (LOPRESSOR) 25 MG tablet Take 0.5 tablets (12.5 mg total) by mouth 2 (two) times daily. 90 tablet 3   niacin (NIASPAN) 500 MG CR tablet Take 500 mg by mouth at bedtime.     polyethylene glycol powder (GLYCOLAX/MIRALAX) 17 GM/SCOOP powder 1/2 scoop  every  other night     ranolazine (RANEXA) 500 MG 12 hr tablet TAKE 1 TABLET BY MOUTH TWICE A DAY 180 tablet 2   rosuvastatin (CRESTOR) 40 MG tablet TAKE 1 TABLET BY MOUTH EVERYDAY AT BEDTIME 90 tablet 3   tacrolimus (PROTOPIC) 0.1 % ointment APPLY TOPICALLY IN THE MORNING AND AT BEDTIME. 100 g 11   nitroGLYCERIN (NITROSTAT) 0.4 MG SL tablet Place 1 tablet (0.4 mg total) under the tongue every 5 (five) minutes x 3 doses as needed. 25 tablet 3   No current facility-administered medications for this visit.   Facility-Administered Medications Ordered in Other Visits  Medication Dose Route Frequency Provider Last Rate Last Admin   sodium chloride 0.9 % injection 3 mL  3 mL Intravenous PRN Kessler Solly, Nadean Corwin, MD        LABS/IMAGING: No results found for this or any previous visit (from the past 48 hour(s)). No results found.  VITALS: BP 120/60 (BP Location: Left Arm, Patient Position: Sitting, Cuff Size: Normal)   Pulse (!) 52   Ht 5\' 9"  (1.753 m)   Wt 167 lb 6.4 oz (75.9 kg)   SpO2 97%   BMI 24.72 kg/m   EXAM: General appearance: alert and no distress Neck: no carotid bruit and no JVD Lungs: clear to auscultation bilaterally Heart: regular rate and rhythm, S1, S2 normal and systolic murmur: systolic ejection 3/6, crescendo at 2nd right intercostal space Abdomen: soft, non-tender; bowel  sounds normal; no masses,  no organomegaly Extremities: extremities normal, atraumatic, no cyanosis or edema Pulses: 2+ and symmetric Skin: Skin color, texture, turgor normal. No rashes or lesions Neurologic: Grossly normal Psych: Pleasant  EKG: Sinus bradycardia, LBBB of 52 -personally reviewed  ASSESSMENT: Coronary artery disease with multiple recent PCI to the right coronary - low risk NST- EF 58% (2017) -LVEF 55 to 37%, grade 2 diastolic dysfunction (08/624)  Aortic stenosis status post TAVR (12/2017)-Edwards Sapien 3 (26 mm) Frequent fusion beats-asymptomatic History of bilateral carotid  endarterectomies Hypertension Dyslipidemia LBBB Stage III chronic kidney disease  PLAN: 1.   [name] still gets some dyspnea however says that it is much improved.  He is TAVR great and is stable.  He was scheduled for another echo in August which I think is unnecessary at this point.  He will remain on long-term dual antiplatelet therapy.  He denies any angina and is done better on Ranexa.  Plan to continue his current medications.  Follow-up with me annually or sooner as necessary.  Pixie Casino, MD, [facility], [place] Director of the Advanced Lipid Disorders &  Cardiovascular Risk Reduction Clinic Attending Cardiologist  Direct Dial: [phone]  Fax: [phone]  Website:  [email or web address] 02/05/2021, 9:51 AM

## 2021-02-05 NOTE — Patient Instructions (Signed)
Medication Instructions:  No Changes In Medications at this time.  *If you need a refill on your cardiac medications before your next appointment, please call your pharmacy*  Follow-Up: At [facility], you and your health needs are our priority.  As part of our continuing mission to provide you with exceptional heart care, we have created designated Provider Care Teams.  These Care Teams include your primary Cardiologist (physician) and Advanced Practice Providers (APPs -  Physician Assistants and Nurse Practitioners) who all work together to provide you with the care you need, when you need it.  Your next appointment:   1 year(s)  The format for your next appointment:   In Person  Provider:   K. Mali [name], MD

## 2021-03-05 DIAGNOSIS — N1832 Chronic kidney disease, stage 3b: Secondary | ICD-10-CM | POA: Diagnosis not present

## 2021-03-07 ENCOUNTER — Other Ambulatory Visit: Payer: Self-pay

## 2021-03-07 ENCOUNTER — Encounter: Payer: Self-pay | Admitting: Dermatology

## 2021-03-07 ENCOUNTER — Ambulatory Visit (INDEPENDENT_AMBULATORY_CARE_PROVIDER_SITE_OTHER): Payer: Medicare PPO | Admitting: Dermatology

## 2021-03-07 DIAGNOSIS — C44311 Basal cell carcinoma of skin of nose: Secondary | ICD-10-CM

## 2021-03-07 DIAGNOSIS — D485 Neoplasm of uncertain behavior of skin: Secondary | ICD-10-CM

## 2021-03-07 NOTE — Patient Instructions (Signed)

## 2021-03-16 ENCOUNTER — Encounter: Payer: Self-pay | Admitting: Dermatology

## 2021-03-16 NOTE — Progress Notes (Signed)
   Follow-Up Visit   Subjective  [name] is a 87 y.o. male who presents for the following: Follow-up (Check previous biopsy with treatment and possible new biopsy ).  New growth left nostril Location:  Duration:  Quality:  Associated Signs/Symptoms: Modifying Factors:  Severity:  Timing: Context:   Objective  Well appearing patient in no apparent distress; mood and affect are within normal limits. Left Ala Nasi Subtle pearly 5 mm papule left ala nasi in a gentleman with history of melanoma and nonmelanoma skin cancers.       A focused examination was performed including head and neck.. Relevant physical exam findings are noted in the Assessment and Plan.   Assessment & Plan    Neoplasm of uncertain behavior of skin Left Ala Nasi  Skin / nail biopsy Type of biopsy: tangential   Informed consent: discussed and consent obtained   Timeout: patient name, date of birth, surgical site, and procedure verified   Anesthesia: the lesion was anesthetized in a standard fashion   Anesthetic:  1% lidocaine w/ epinephrine 1-100,000 local infiltration Instrument used: flexible razor blade   Hemostasis achieved with: aluminum chloride and electrodesiccation   Outcome: patient tolerated procedure well   Post-procedure details: wound care instructions given    Specimen 1 - Surgical pathology Differential Diagnosis: bcc scc  Check Margins: No      I, Lavonna Monarch, MD, have reviewed all documentation for this visit.  The documentation on 03/16/21 for the exam, diagnosis, procedures, and orders are all accurate and complete.

## 2021-03-20 ENCOUNTER — Telehealth: Payer: Self-pay

## 2021-03-20 NOTE — Telephone Encounter (Signed)
Phone call to patient with his pathology results. Voicemail left for patient to give the office a call back.  ?

## 2021-03-20 NOTE — Telephone Encounter (Signed)
-----   Message from Lavonna Monarch, MD sent at 03/20/2021  6:43 AM EDT ----- Please let Mr. Shorey know that while this is not related to his melanoma, it would best be treated because of the location with Mohs surgery.

## 2021-03-21 NOTE — Telephone Encounter (Signed)
Pathology to patient.  °

## 2021-03-21 NOTE — Telephone Encounter (Signed)
MOH's info sent to [facility]. 

## 2021-03-21 NOTE — Telephone Encounter (Signed)
Called again.......

## 2021-04-02 ENCOUNTER — Other Ambulatory Visit (HOSPITAL_COMMUNITY): Payer: Medicare PPO

## 2021-04-03 ENCOUNTER — Other Ambulatory Visit (HOSPITAL_COMMUNITY): Payer: Medicare Other

## 2021-04-12 ENCOUNTER — Other Ambulatory Visit: Payer: Self-pay | Admitting: Internal Medicine

## 2021-05-14 DIAGNOSIS — I129 Hypertensive chronic kidney disease with stage 1 through stage 4 chronic kidney disease, or unspecified chronic kidney disease: Secondary | ICD-10-CM | POA: Diagnosis not present

## 2021-05-14 DIAGNOSIS — R809 Proteinuria, unspecified: Secondary | ICD-10-CM | POA: Diagnosis not present

## 2021-05-14 DIAGNOSIS — N1832 Chronic kidney disease, stage 3b: Secondary | ICD-10-CM | POA: Diagnosis not present

## 2021-05-14 DIAGNOSIS — I2510 Atherosclerotic heart disease of native coronary artery without angina pectoris: Secondary | ICD-10-CM | POA: Diagnosis not present

## 2021-05-14 DIAGNOSIS — N2581 Secondary hyperparathyroidism of renal origin: Secondary | ICD-10-CM | POA: Diagnosis not present

## 2021-05-14 DIAGNOSIS — Z952 Presence of prosthetic heart valve: Secondary | ICD-10-CM | POA: Diagnosis not present

## 2021-05-14 DIAGNOSIS — E785 Hyperlipidemia, unspecified: Secondary | ICD-10-CM | POA: Diagnosis not present

## 2021-05-14 DIAGNOSIS — I739 Peripheral vascular disease, unspecified: Secondary | ICD-10-CM | POA: Diagnosis not present

## 2021-05-14 DIAGNOSIS — I272 Pulmonary hypertension, unspecified: Secondary | ICD-10-CM | POA: Diagnosis not present

## 2021-05-23 DIAGNOSIS — C44311 Basal cell carcinoma of skin of nose: Secondary | ICD-10-CM | POA: Diagnosis not present

## 2021-05-28 DIAGNOSIS — Z0000 Encounter for general adult medical examination without abnormal findings: Secondary | ICD-10-CM | POA: Diagnosis not present

## 2021-05-28 DIAGNOSIS — Z23 Encounter for immunization: Secondary | ICD-10-CM | POA: Diagnosis not present

## 2021-05-28 DIAGNOSIS — N1832 Chronic kidney disease, stage 3b: Secondary | ICD-10-CM | POA: Diagnosis not present

## 2021-05-28 DIAGNOSIS — I272 Pulmonary hypertension, unspecified: Secondary | ICD-10-CM | POA: Diagnosis not present

## 2021-05-28 DIAGNOSIS — I25119 Atherosclerotic heart disease of native coronary artery with unspecified angina pectoris: Secondary | ICD-10-CM | POA: Diagnosis not present

## 2021-05-28 DIAGNOSIS — I10 Essential (primary) hypertension: Secondary | ICD-10-CM | POA: Diagnosis not present

## 2021-05-28 DIAGNOSIS — R809 Proteinuria, unspecified: Secondary | ICD-10-CM | POA: Diagnosis not present

## 2021-05-28 DIAGNOSIS — D631 Anemia in chronic kidney disease: Secondary | ICD-10-CM | POA: Diagnosis not present

## 2021-05-28 DIAGNOSIS — I739 Peripheral vascular disease, unspecified: Secondary | ICD-10-CM | POA: Diagnosis not present

## 2021-05-30 ENCOUNTER — Other Ambulatory Visit: Payer: Self-pay | Admitting: Internal Medicine

## 2021-07-03 ENCOUNTER — Ambulatory Visit: Payer: Medicare PPO | Admitting: Internal Medicine

## 2021-08-22 DIAGNOSIS — R809 Proteinuria, unspecified: Secondary | ICD-10-CM | POA: Diagnosis not present

## 2021-08-22 DIAGNOSIS — Z952 Presence of prosthetic heart valve: Secondary | ICD-10-CM | POA: Diagnosis not present

## 2021-08-22 DIAGNOSIS — N2581 Secondary hyperparathyroidism of renal origin: Secondary | ICD-10-CM | POA: Diagnosis not present

## 2021-08-22 DIAGNOSIS — Z8673 Personal history of transient ischemic attack (TIA), and cerebral infarction without residual deficits: Secondary | ICD-10-CM | POA: Diagnosis not present

## 2021-08-22 DIAGNOSIS — N189 Chronic kidney disease, unspecified: Secondary | ICD-10-CM | POA: Diagnosis not present

## 2021-08-22 DIAGNOSIS — N1832 Chronic kidney disease, stage 3b: Secondary | ICD-10-CM | POA: Diagnosis not present

## 2021-08-22 DIAGNOSIS — I129 Hypertensive chronic kidney disease with stage 1 through stage 4 chronic kidney disease, or unspecified chronic kidney disease: Secondary | ICD-10-CM | POA: Diagnosis not present

## 2021-08-22 DIAGNOSIS — I2510 Atherosclerotic heart disease of native coronary artery without angina pectoris: Secondary | ICD-10-CM | POA: Diagnosis not present

## 2021-08-22 DIAGNOSIS — I272 Pulmonary hypertension, unspecified: Secondary | ICD-10-CM | POA: Diagnosis not present

## 2021-08-22 DIAGNOSIS — E785 Hyperlipidemia, unspecified: Secondary | ICD-10-CM | POA: Diagnosis not present

## 2021-09-22 ENCOUNTER — Other Ambulatory Visit: Payer: Self-pay | Admitting: Internal Medicine

## 2021-10-14 ENCOUNTER — Other Ambulatory Visit: Payer: Self-pay | Admitting: Dermatology

## 2021-10-14 DIAGNOSIS — L209 Atopic dermatitis, unspecified: Secondary | ICD-10-CM

## 2021-10-16 ENCOUNTER — Telehealth: Payer: Self-pay | Admitting: Dermatology

## 2021-10-16 DIAGNOSIS — L209 Atopic dermatitis, unspecified: Secondary | ICD-10-CM

## 2021-10-16 MED ORDER — TACROLIMUS 0.1 % EX OINT
TOPICAL_OINTMENT | CUTANEOUS | 3 refills | Status: AC
Start: 2021-10-16 — End: ?

## 2021-10-16 NOTE — Telephone Encounter (Signed)
Wants refill of Tacralinus ointment. Pharmacy: [facility] in [place]. Pharmacy told him they sent request but never heard from [place]. He's out of it and is very anxious to get refill ASAP ?

## 2021-10-22 ENCOUNTER — Other Ambulatory Visit: Payer: Self-pay

## 2021-10-22 MED ORDER — METOPROLOL TARTRATE 25 MG PO TABS: 12.5000 mg | ORAL_TABLET | Freq: Two times a day (BID) | ORAL | 3 refills | Status: DC

## 2021-10-24 ENCOUNTER — Other Ambulatory Visit: Payer: Self-pay

## 2021-10-24 MED ORDER — FUROSEMIDE 40 MG PO TABS: 40.0000 mg | ORAL_TABLET | Freq: Every day | ORAL | 3 refills | Status: DC

## 2021-11-12 ENCOUNTER — Telehealth: Payer: Self-pay | Admitting: Internal Medicine

## 2021-11-12 NOTE — Telephone Encounter (Signed)
?*  STAT* If patient is at the pharmacy, call can be transferred to refill team. ? ? ?1. Which medications need to be refilled? (please list name of each medication and dose if known) niacin (NIASPAN) 500 MG CR tablet ? ?2. Which pharmacy/location (including street and city if local pharmacy) is medication to be sent to? [id number] [place] [place] [address]? ?3. Do they need a 30 day or 90 day supply? 90 ? ?

## 2021-11-27 ENCOUNTER — Ambulatory Visit: Payer: Medicare PPO | Admitting: Dermatology

## 2021-11-27 ENCOUNTER — Encounter: Payer: Self-pay | Admitting: Dermatology

## 2021-11-27 DIAGNOSIS — Z1283 Encounter for screening for malignant neoplasm of skin: Secondary | ICD-10-CM | POA: Diagnosis not present

## 2021-11-27 DIAGNOSIS — C44622 Squamous cell carcinoma of skin of right upper limb, including shoulder: Secondary | ICD-10-CM

## 2021-11-27 DIAGNOSIS — L2084 Intrinsic (allergic) eczema: Secondary | ICD-10-CM | POA: Diagnosis not present

## 2021-11-27 DIAGNOSIS — D485 Neoplasm of uncertain behavior of skin: Secondary | ICD-10-CM

## 2021-11-27 NOTE — Patient Instructions (Addendum)
Biopsy, Surgery (Curettage) & Surgery (Excision) Aftercare Instructions ? ?1. Okay to remove bandage in 24 hours ? ?2. Wash area with soap and water ? ?3. Apply Vaseline to area twice daily until healed (Not Neosporin) ? ?4. Okay to cover with a Band-Aid to decrease the chance of infection or prevent irritation from clothing; also it's okay to uncover lesion at home. ? ?5. Suture instructions: return to our office in 7-10 or 10-14 days for a nurse visit for suture removal. Variable healing with sutures, if pain or itching occurs call our office. It's okay to shower or bathe 24 hours after sutures are given. ? ?6. The following risks may occur after a biopsy, curettage or excision: bleeding, scarring, discoloration, recurrence, infection (redness, yellow drainage, pain or swelling). ? ?7. For questions, concerns and results call our office at [facility] before 4pm & Friday before 3pm. Biopsy results will be available in 1 week. ? ?Tralokinumab Injection ?What is this medication? ?TRALOKINUMAB (tral oh kin ue mab) reduces swelling, redness, and itching of the skin caused by eczema. It works by decreasing inflammation of the skin. ?This medicine may be used for other purposes; ask your health care provider or pharmacist if you have questions. ?COMMON BRAND NAME(S): Adbry ?What should I tell my care team before I take this medication? ?They need to know if you have any of these conditions: ?Eye disease ?Parasitic (helminth) infection ?Recent or upcoming vaccine ?Vision problems ?An unusual or allergic reaction to tralokinumab, other medications, foods, dyes, or preservatives ?Pregnant or trying to get pregnant ?Breast-feeding ?How should I use this medication? ?This medication is injected under the skin. You will be taught how to prepare and give it. Take it as directed on the prescription label. Keep taking it unless your care team tells you to stop. ?It is important that you put your used needles and syringes in  a special sharps container. Do not put them in a trash can. If you do not have a sharps container, call your pharmacist or care team to get one. ?This medication comes with INSTRUCTIONS FOR USE. Ask your pharmacist for directions on how to use this medication. Read the information carefully. Talk to your pharmacist or care team if you have questions. ?Talk to your care team about the use of this medication in children. Special care may be needed. ?Overdosage: If you think you have taken too much of this medicine contact a poison control center or emergency room at once. ?NOTE: This medicine is only for you. Do not share this medicine with others. ?What if I miss a dose? ?If you miss a dose, take it as soon as you can. Then continue your normal schedule. If it is almost time for your next dose, take only that dose. Do not take double or extra doses. Call your care team with questions. ?What may interact with this medication? ?Vaccines ?This list may not describe all possible interactions. Give your health care provider a list of all the medicines, herbs, non-prescription drugs, or dietary supplements you use. Also tell them if you smoke, drink alcohol, or use illegal drugs. Some items may interact with your medicine. ?What should I watch for while using this medication? ?Visit your care team for regular checks on your progress. It may be some time before you see the benefit from this medication. ?This medication can decrease the response to a vaccine. If you need to get vaccinated, tell your care team if you have received this medication. Extra booster doses  may be needed. Talk to your care team to see if a different vaccination schedule is needed. ?Tell you care team right away if you have any change in your eyesight or have eye pain. ?What side effects may I notice from receiving this medication? ?Side effects that you should report to your care team as soon as possible: ?Allergic reactions or angioedema--skin  rash, itching, hives, swelling of the face, eyes, lips, tongue, arms, or legs, trouble swallowing or breathing ?Change in vision ?New or worsening eye pain ?Side effects that usually do not require medical attention (report these to your care team if they continue or are bothersome): ?Eye or eyelid redness, swelling, or itching ?Pain, redness, or irritation at injection site ?Runny nose ?Sore throat ?This list may not describe all possible side effects. Call your doctor for medical advice about side effects. You may report side effects to FDA at 1-800-FDA-1088. ?Where should I keep my medication? ?Keep out of the reach of children and pets. ?Store in a refrigerator between 2 and 8 degrees C (36 and 46 degrees F) or at room temperature. Do not store above 25 degrees C (77 degrees F) or expose to heat. ?Refrigeration (preferred): Store it in the refrigerator. Keep it in the original carton until you are ready to take it. Remove the carton about 30 minutes before it is time for you to take the medication. Do not put the carton back into the refrigerator after it has reached room temperature. Throw away any unused medicine after the expiration date. ?Room Temperature: This medicine may be stored at room temperature for up to 14 days. Keep it in the original carton until you are ready to take it. If stored at room temperature, throw away any unused medication after 14 days or after the expiration date, whichever is first. ?To get rid of medications that are no longer needed or have expired: ?Take the medication to a medication take-back program. Check with your pharmacy or law enforcement to find a location. ?If you cannot return the medication, ask your pharmacist or care team how to get rid of this medication safely. ?NOTE: This sheet is a summary. It may not cover all possible information. If you have questions about this medicine, talk to your doctor, pharmacist, or health care provider. ?? 2022 Elsevier/Gold  Standard (2020-08-17 00:00:00) ? ? ? ?

## 2021-12-03 ENCOUNTER — Encounter: Payer: Self-pay | Admitting: Dermatology

## 2021-12-03 NOTE — Progress Notes (Signed)
? ?  Follow-Up Visit ?  ?Subjective  ?[name] is a 88 y.o. male who presents for the following: Rash (Patient flared all over and he d/c the Dupixent due to side effects, current treatment tacrolimus it helps some. Possible biopsy left arm per patient ). ? ?Significant flare in his generalized eczema since stopping the Dupixent, itching is intolerable.  Also has rapid growth of the nodule on his right arm. ?Location:  ?Duration:  ?Quality:  ?Associated Signs/Symptoms: ?Modifying Factors:  ?Severity:  ?Timing: ?Context:  ? ?Objective  ?Well appearing patient in no apparent distress; mood and affect are within normal limits. ?Waist up exam: No atypical pigmented lesions.  Still with multiple excoriations, generalized itching, extensive eczematous dermatitis particularly on the torso.  1 possible nonmelanoma skin cancer on his arm will be biopsied and treated. ? ?The patient's intrinsic eczema is affecting his sleeping has a major negative impact on the quality of his life.  He discontinued the Dupixent because he felt he was having side effects. ? ?Right Upper Arm - Posterior ?Rapid growing nodule without classic volcano shape but still best fits a variant of squamous cell carcinoma. ? ? ? ? ? ? ? ? ?All skin waist up examined. ? ? ?Assessment & Plan  ? ? ?Encounter for screening for malignant neoplasm of skin ? ?Annual skin examination. ? ?Intrinsic eczema ? ?I spent 15 minutes with I I conversation about his future treatment options.  UV light therapy may help but would be quite inconvenient.  The Dupixent was helping so if we could get him approved for Adbry perhaps this would both help but be better tolerated; this is a logical neck step.  We also discussed use of oral Jak inhibitors and getting a second opinion from Dr. Georges Lynch at [facility] Department of dermatology. ? 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 ?Skin / nail  biopsy ?Type of biopsy: tangential   ?Informed consent: discussed and consent obtained   ?Timeout: patient name, date of birth, surgical site, and procedure verified   ?Anesthesia: the lesion was anesthetized in a standard fashion   ?Anesthetic:  1% lidocaine w/ epinephrine 1-100,000 local infiltration ?Instrument used: flexible razor blade   ?Hemostasis achieved with: aluminum chloride and electrodesiccation   ?Outcome: patient tolerated procedure well   ?Post-procedure details: wound care instructions given   ? ?Destruction of lesion ?Complexity: simple   ?Destruction method: electrodesiccation and curettage   ?Informed consent: discussed and consent obtained   ?Timeout:  patient name, date of birth, surgical site, and procedure verified ?Anesthesia: the lesion was anesthetized in a standard fashion   ?Anesthetic:  1% lidocaine w/ epinephrine 1-100,000 local infiltration ?Curettage cycles:  3 ?Lesion length (cm):  1.8 ?Lesion width (cm):  1.8 ?Margin per side (cm):  0 ?Final wound size (cm):  1.8 ?Hemostasis achieved with:  ferric subsulfate ?Outcome: patient tolerated procedure well with no complications   ?Post-procedure details: sterile dressing applied and wound care instructions given   ?Dressing type: bandage and petrolatum   ? ?Specimen 1 - Surgical pathology ?Differential Diagnosis: r/o cyst- txpbx ? ?Check Margins: No ? ?After shave biopsy obvious deep dermal keratin was visualized and treated with curettage plus cautery. ? ? ? ? ? ?I, Lavonna Monarch, MD, have reviewed all documentation for this visit.  The documentation on 12/03/21 for the exam, diagnosis, procedures, and orders are all accurate and complete. ?

## 2021-12-04 DIAGNOSIS — I739 Peripheral vascular disease, unspecified: Secondary | ICD-10-CM | POA: Diagnosis not present

## 2021-12-04 DIAGNOSIS — N1832 Chronic kidney disease, stage 3b: Secondary | ICD-10-CM | POA: Diagnosis not present

## 2021-12-04 DIAGNOSIS — E785 Hyperlipidemia, unspecified: Secondary | ICD-10-CM | POA: Diagnosis not present

## 2021-12-04 DIAGNOSIS — N2581 Secondary hyperparathyroidism of renal origin: Secondary | ICD-10-CM | POA: Diagnosis not present

## 2021-12-04 DIAGNOSIS — I2510 Atherosclerotic heart disease of native coronary artery without angina pectoris: Secondary | ICD-10-CM | POA: Diagnosis not present

## 2021-12-04 DIAGNOSIS — N189 Chronic kidney disease, unspecified: Secondary | ICD-10-CM | POA: Diagnosis not present

## 2021-12-04 DIAGNOSIS — Z952 Presence of prosthetic heart valve: Secondary | ICD-10-CM | POA: Diagnosis not present

## 2021-12-04 DIAGNOSIS — R809 Proteinuria, unspecified: Secondary | ICD-10-CM | POA: Diagnosis not present

## 2021-12-04 DIAGNOSIS — I272 Pulmonary hypertension, unspecified: Secondary | ICD-10-CM | POA: Diagnosis not present

## 2021-12-04 DIAGNOSIS — I129 Hypertensive chronic kidney disease with stage 1 through stage 4 chronic kidney disease, or unspecified chronic kidney disease: Secondary | ICD-10-CM | POA: Diagnosis not present

## 2021-12-05 ENCOUNTER — Telehealth: Payer: Self-pay

## 2021-12-05 NOTE — Telephone Encounter (Signed)
New prescription and enrollment form for [facility] faxed.  ?

## 2021-12-09 ENCOUNTER — Telehealth: Payer: Self-pay

## 2021-12-09 NOTE — Telephone Encounter (Signed)
Adbry new start sent through [facility] portal.  ?

## 2021-12-09 NOTE — Telephone Encounter (Signed)
Fax received from [facility] stating that they have received the patients enrollment form. ?

## 2021-12-10 MED ORDER — ADBRY 150 MG/ML ~~LOC~~ SOSY: 300.0000 mg | PREFILLED_SYRINGE | SUBCUTANEOUS | 4 refills | Status: DC

## 2021-12-10 NOTE — Addendum Note (Signed)
Addended by: Ulla Potash on: 12/10/2021 02:30 PM ? ? Modules accepted: Orders ? ?

## 2021-12-16 NOTE — Telephone Encounter (Signed)
Approval via I[facility] Fallen 08/18/21- 08/17/22 ? ?PA number [id number] ?

## 2021-12-28 ENCOUNTER — Other Ambulatory Visit: Payer: Self-pay | Admitting: Internal Medicine

## 2022-01-01 ENCOUNTER — Telehealth: Payer: Self-pay

## 2022-01-01 ENCOUNTER — Other Ambulatory Visit: Payer: Self-pay | Admitting: Internal Medicine

## 2022-01-01 NOTE — Telephone Encounter (Signed)
Fax received from [facility] stating that the patient has agreed to delivery of 01/03/2022 and is copay is $100. ?

## 2022-01-06 ENCOUNTER — Telehealth: Payer: Self-pay | Admitting: Dermatology

## 2022-01-06 NOTE — Telephone Encounter (Signed)
Patient is calling to say that he has received the Adbry shipment and he wants to know when he should take his next shot.

## 2022-01-06 NOTE — Telephone Encounter (Signed)
Left patient a message to call back.

## 2022-01-15 NOTE — Telephone Encounter (Signed)
Phone call to patient to answer his questions regarding his Adbry injections. Patient states that he figured it out and he would like to schedule an appointment with Dr. Denna Haggard. I informed patient that Dr. Denna Haggard doesn't have any appointments for the remainder of the year and his schedule isn't open for next year. Patient aware.

## 2022-02-15 ENCOUNTER — Other Ambulatory Visit: Payer: Self-pay | Admitting: Internal Medicine

## 2022-03-03 ENCOUNTER — Other Ambulatory Visit: Payer: Self-pay | Admitting: Internal Medicine

## 2022-03-10 ENCOUNTER — Ambulatory Visit: Payer: Medicare PPO | Admitting: Dermatology

## 2022-03-10 DIAGNOSIS — L2084 Intrinsic (allergic) eczema: Secondary | ICD-10-CM | POA: Diagnosis not present

## 2022-03-10 MED ORDER — TACROLIMUS 0.1 % EX OINT: TOPICAL_OINTMENT | Freq: Two times a day (BID) | CUTANEOUS | 11 refills | Status: DC

## 2022-03-13 ENCOUNTER — Other Ambulatory Visit: Payer: Self-pay | Admitting: Internal Medicine

## 2022-03-20 DIAGNOSIS — I272 Pulmonary hypertension, unspecified: Secondary | ICD-10-CM | POA: Diagnosis not present

## 2022-03-20 DIAGNOSIS — Z952 Presence of prosthetic heart valve: Secondary | ICD-10-CM | POA: Diagnosis not present

## 2022-03-20 DIAGNOSIS — N1832 Chronic kidney disease, stage 3b: Secondary | ICD-10-CM | POA: Diagnosis not present

## 2022-03-20 DIAGNOSIS — R809 Proteinuria, unspecified: Secondary | ICD-10-CM | POA: Diagnosis not present

## 2022-03-20 DIAGNOSIS — I129 Hypertensive chronic kidney disease with stage 1 through stage 4 chronic kidney disease, or unspecified chronic kidney disease: Secondary | ICD-10-CM | POA: Diagnosis not present

## 2022-03-20 DIAGNOSIS — N2581 Secondary hyperparathyroidism of renal origin: Secondary | ICD-10-CM | POA: Diagnosis not present

## 2022-03-20 DIAGNOSIS — I2510 Atherosclerotic heart disease of native coronary artery without angina pectoris: Secondary | ICD-10-CM | POA: Diagnosis not present

## 2022-03-20 DIAGNOSIS — N189 Chronic kidney disease, unspecified: Secondary | ICD-10-CM | POA: Diagnosis not present

## 2022-03-20 DIAGNOSIS — I739 Peripheral vascular disease, unspecified: Secondary | ICD-10-CM | POA: Diagnosis not present

## 2022-03-20 DIAGNOSIS — E785 Hyperlipidemia, unspecified: Secondary | ICD-10-CM | POA: Diagnosis not present

## 2022-03-21 ENCOUNTER — Other Ambulatory Visit: Payer: Self-pay | Admitting: Internal Medicine

## 2022-03-31 ENCOUNTER — Other Ambulatory Visit: Payer: Self-pay | Admitting: Internal Medicine

## 2022-03-31 ENCOUNTER — Encounter: Payer: Self-pay | Admitting: Dermatology

## 2022-03-31 NOTE — Progress Notes (Signed)
   Follow-Up Visit   Subjective  [name] is a 88 y.o. male who presents for the following: Annual Exam (Here for eczema. All over. Patient was on Crown. And now he is on Adbry, but patient is not doing well on it. Still breaking out. ).  Eczema, generalized itching Location:  Duration:  Quality:  Associated Signs/Symptoms: Modifying Factors:  Severity:  Timing: Context:   Objective  Well appearing patient in no apparent distress; mood and affect are within normal limits. Patchy chronic dermatitis over 20% body surface area but discordant itching over most of body.  No sign of scabies.  No infiltrated patches to suggest CTCL.    All skin waist up examined.   Assessment & Plan    Intrinsic atopic dermatitis  Possible UVB treatment if topical does not work.  His age and history of cardiovascular disease make me reluctant to try you Rinvoq or Cibinqo.  tacrolimus (PROTOPIC) 0.1 % ointment Apply topically 2 (two) times daily.      I, Lavonna Monarch, MD, have reviewed all documentation for this visit.  The documentation on 03/31/22 for the exam, diagnosis, procedures, and orders are all accurate and complete.

## 2022-04-04 ENCOUNTER — Other Ambulatory Visit (HOSPITAL_COMMUNITY): Payer: Self-pay | Admitting: *Deleted

## 2022-04-05 ENCOUNTER — Other Ambulatory Visit: Payer: Self-pay | Admitting: Internal Medicine

## 2022-04-07 ENCOUNTER — Ambulatory Visit (HOSPITAL_COMMUNITY)
Admission: RE | Admit: 2022-04-07 | Discharge: 2022-04-07 | Disposition: A | Discharge status: Home / Self Care | Payer: Medicare PPO | Source: Ambulatory Visit | Attending: Nephrology | Admitting: Nephrology

## 2022-04-07 ENCOUNTER — Encounter (HOSPITAL_COMMUNITY): Payer: Medicare PPO

## 2022-04-07 DIAGNOSIS — N189 Chronic kidney disease, unspecified: Secondary | ICD-10-CM | POA: Insufficient documentation

## 2022-04-07 DIAGNOSIS — D631 Anemia in chronic kidney disease: Secondary | ICD-10-CM | POA: Diagnosis not present

## 2022-04-07 MED ORDER — SODIUM CHLORIDE 0.9 % IV SOLN
510.0000 mg | INTRAVENOUS | Status: DC
  Administered 2022-04-07: 510 mg via INTRAVENOUS
  Filled 2022-04-07: qty 510

## 2022-04-11 ENCOUNTER — Other Ambulatory Visit: Payer: Self-pay | Admitting: Internal Medicine

## 2022-04-14 ENCOUNTER — Ambulatory Visit (HOSPITAL_COMMUNITY)
Admission: RE | Admit: 2022-04-14 | Discharge: 2022-04-14 | Disposition: A | Discharge status: Home / Self Care | Payer: Medicare PPO | Source: Ambulatory Visit | Attending: Nephrology | Admitting: Nephrology

## 2022-04-14 DIAGNOSIS — D631 Anemia in chronic kidney disease: Secondary | ICD-10-CM | POA: Insufficient documentation

## 2022-04-14 DIAGNOSIS — N189 Chronic kidney disease, unspecified: Secondary | ICD-10-CM | POA: Insufficient documentation

## 2022-04-14 MED ORDER — SODIUM CHLORIDE 0.9 % IV SOLN
510.0000 mg | INTRAVENOUS | Status: AC
  Administered 2022-04-14: 510 mg via INTRAVENOUS
  Filled 2022-04-14: qty 17

## 2022-04-15 ENCOUNTER — Other Ambulatory Visit: Payer: Self-pay | Admitting: Internal Medicine

## 2022-04-29 ENCOUNTER — Other Ambulatory Visit: Payer: Self-pay | Admitting: Internal Medicine

## 2022-04-30 ENCOUNTER — Telehealth: Payer: Self-pay | Admitting: Internal Medicine

## 2022-04-30 NOTE — Telephone Encounter (Signed)
Spoke to patient's wife.Advised I will send message to Dr.[name]'s RN to schedule a sooner appointment.

## 2022-04-30 NOTE — Telephone Encounter (Signed)
Patient called to schedule appt with Dr. Debara Pickett I offered appt for December as that is that first available with Dr. Debara Pickett.  Patient refused to take appt. I offered him appt with APP he refused to see APP.  I offered to put him on wait list he refused that as well.  He said to send a note as he knows Dr. Debara Pickett personally that he wants an appt. with him soon.

## 2022-05-01 NOTE — Telephone Encounter (Signed)
Message sent to scheduler to arrange appointment  DOD day OK

## 2022-05-05 ENCOUNTER — Other Ambulatory Visit: Payer: Self-pay | Admitting: Internal Medicine

## 2022-05-06 NOTE — Telephone Encounter (Signed)
Scheduled 05/07/22 with Angie PA

## 2022-05-07 ENCOUNTER — Telehealth: Payer: Self-pay | Admitting: Internal Medicine

## 2022-05-07 ENCOUNTER — Ambulatory Visit: Payer: Medicare PPO | Admitting: Physician Assistant

## 2022-05-07 ENCOUNTER — Other Ambulatory Visit: Payer: Self-pay | Admitting: *Deleted

## 2022-05-07 MED ORDER — CLOPIDOGREL BISULFATE 75 MG PO TABS: 75.0000 mg | ORAL_TABLET | Freq: Every day | ORAL | 0 refills | Status: DC

## 2022-05-07 NOTE — Telephone Encounter (Signed)
Pt requested refill on med. Called  back to triage - refill was sent.

## 2022-05-13 NOTE — Progress Notes (Signed)
Cardiology Clinic Note   Patient Name: [name] Date of Encounter: 05/16/2022  Primary Care Provider:  Deland Pretty, MD Primary Cardiologist:  Pixie Casino, MD  Patient Profile    Kingsley Spittle. Caudill 88 year old male presents the clinic today for follow-up evaluation of his dyspnea and fatigue.  Past Medical History    Past Medical History:  Diagnosis Date   CAD (coronary artery disease)    a. s/p CABG in 1996 and subsequent PCI   Carotid artery disease    a. Rt. CEA, 08/04/07, Lt. CEA 04/26/07.   Chronic kidney disease    CKD (chronic kidney disease) stage 2, GFR 60-89 ml/min 09/16/2011   Coronary artery disease    History of CVA (cerebrovascular accident)    a. 2008   HTN (hypertension),uncontrolled 09/16/2011   Hypertension    LBBB (left bundle branch block)    Melanoma ([place]) 03/28/2019   Right [place] (in situ) [facility])   Peripheral arterial disease ([place])    Pulmonary hypertension, mild 09/17/2011   S/P TAVR (transcatheter aortic valve replacement)    a. 09/15/17: s/p Edwards Sapien 3 THV (size 26 mm, model # 9600TFX, serial # [id number])   SCCA (squamous cell carcinoma) of skin 03/28/2019   Right Shoulder (curet and 5FU)   SCCA (squamous cell carcinoma) of skin 03/28/2019   Right Top Hand (in situ) (curet and 5FU)   Squamous cell carcinoma of skin 05/17/2018   Left Post Shoulder (well diff) ([facility])   Past Surgical History:  Procedure Laterality Date    Fluid knee Right Sept. 6, 2014   Fluid removed   CARDIAC CATHETERIZATION Left 06/10/2012   Ostial RCA 99% stenosis pre-dilation 3x74m Angiosculpt PTCA balloon, 3.5x164mPromus Element DES overlapping proximal stent and into the Aorto-ostium, post-dilated with4x8m91mC Trek balloon - 99% stenosis reduced to 0%   CARDIAC CATHETERIZATION Left 12/22/2011   Left Main-diffuse tapering 50-60% stenosis; mild-moderate diseaseof the right ventricle branch of the right coronary artery; will plan FFR of ostial RCA   CARDIAC  CATHETERIZATION Bilateral 09/16/2011   Severe 90% ostial RCA disease which is calcified, will likely need PCI to ostial RCA with calcification   CARDIAC CATHETERIZATION  09/18/2011   Calcified ostial 90-95% proximal RCA stenosis, 3x12m79merge balloon predilation, 3x12mm93m PROMUS Element stent was placed, post stent dilation done utilizing a 3.25x12mm 70mompliant Quantum Apex balloon, 90-95% stenosis reduced to 0%   CEA  2008   CORONARY ANGIOPLASTY     CORONARY ARTERY BYPASS GRAFT  08/04/2007   FRACTIONAL FLOW RESERVE WIRE  12/22/2011   Ostial RCA, FFR ratio-0.82, physiologically significant, 3.25x10mm F56mome Cutting balloon deployed at ~3.4mm fin75mestimated diameter   FRACTIONAL FLOW RESERVE WIRE Right 12/22/2011   Procedure: FRACTIONAL FLOW RESERVE WIRE;  Surgeon: Kenneth Pixie Casinoocation: [facility];  Service: Cardiovascular;  Laterality: Right;   LEFT AND RIGHT HEART CATHETERIZATION WITH CORONARY ANGIOGRAM Left 09/16/2011   Procedure: LEFT AND RIGHT HEART CATHETERIZATION WITH CORONARY ANGIOGRAM;  Surgeon: Kenneth Pixie Casinoocation[facility];  Service: Cardiovascular;  Laterality: Left;   LEFT HEART CATHETERIZATION WITH CORONARY ANGIOGRAM N/A 06/10/2012   Procedure: LEFT HEART CATHETERIZATION WITH CORONARY ANGIOGRAM;  Surgeon: Kenneth Pixie Casinoocation[facility];  Service: Cardiovascular;  Laterality: N/A;   LEFT HEART CATHETERIZATION WITH CORONARY/GRAFT ANGIOGRAM N/A 12/22/2011   Procedure: LEFT HEART CATHETERIZATION WITH CORONARYBeatrix Fetterson: Kenneth Pixie Casinoocation: [facility];  Service: Cardiovascular;  Laterality:  N/A;   PERCUTANEOUS CORONARY ROTOBLATOR INTERVENTION (PCI-R)  09/18/2011   Procedure: PERCUTANEOUS CORONARY ROTOBLATOR INTERVENTION (PCI-R);  Surgeon: Troy Sine, MD;  Location: [facility] CATH LAB;  Service: Cardiovascular;;   PERCUTANEOUS CORONARY STENT INTERVENTION (PCI-S) N/A 09/18/2011   Procedure: PERCUTANEOUS CORONARY STENT  INTERVENTION (PCI-S);  Surgeon: Troy Sine, MD;  Location[facility] CATH LAB;  Service: Cardiovascular;  Laterality: N/A;   RIGHT/LEFT HEART CATH AND CORONARY/GRAFT ANGIOGRAPHY N/A 07/30/2017   Procedure: RIGHT/LEFT HEART CATH AND CORONARY/GRAFT ANGIOGRAPHY;  Surgeon: Belva Crome, MD;  Location: [place] CV LAB;  Service: Cardiovascular;  Laterality: N/A;   TEE WITHOUT CARDIOVERSION N/A 09/15/2017   Procedure: TRANSESOPHAGEAL ECHOCARDIOGRAM (TEE);  Surgeon: Burnell Blanks, MD;  Location: [place];  Service: Open Heart Surgery;  Laterality: N/A;   TEMPORARY PACEMAKER INSERTION  09/18/2011   Procedure: TEMPORARY PACEMAKER INSERTION;  Surgeon: Troy Sine, MD;  [facility] LAB;  Service: Cardiovascular;;   TRANSCATHETER AORTIC VALVE REPLACEMENT, TRANSFEMORAL N/A 09/15/2017   Procedure: TRANSCATHETER AORTIC VALVE REPLACEMENT, TRANSFEMORAL;  Surgeon: Burnell Blanks, MD;  Location: [place];  Service: Open Heart Surgery;  Laterality: N/A;    Allergies  Allergies  Allergen Reactions   Atorvastatin Other (See Comments)    Muscle and joint pain, can tolerate rosuvastatin    History of Present Illness    Mr. Turgeon has a PMH of coronary artery disease, chronic renal insufficiency, TAVR 1/19, HTN, PVD, and chronic bradycardia with LBBB.  He underwent CABG in 1996.  He had multiple RCA PCI's with last being in 2013.  Echocardiogram 2/20 showed preserved LV EF with good valve function.  He is noted to have chronic bradycardia with heart rates in the 50s and LBBB.  He was seen by Kerin Ransom, PA-C on 10/09/2020.  During that time he reported increasing shortness of breath and fatigue with exertion over the last 6 months.  He reported his symptoms had gradually increased.  At the time of the visit he reported he did not feel like he could do anything.  Previously he reported being active.  He denied syncope.  He was noted to have lower extremity edema.  BNP, CBC, follow-up echocardiogram,  Lopressor was decreased to 12.5 mg twice daily.  Echocardiogram 10/31/2020 showed normal LVEF, G2 DD, and mildly dilated left atria with severely dilated right atria, trivial mitral valve regurgitation, and mild-moderate tricuspid valve regurgitation, TAVR in correct position and functioning normally.  He presented the clinic 11/01/20 for follow-up evaluation stated he was feeling much better.  He noticed increased activity tolerance with his reduction in metoprolol.  He reported he stayed very physically active at home cutting grass and doing chores around his house.  We reviewed his echocardiogram and he expressed understanding.  He reported he just had labs drawn at [facility].  I requested his labs.  We gave him salty 6 diet sheet, had him maintain his physical activity and planned follow-up with Dr. Debara Pickett in 3 months.  He was seen in follow-up by Dr. Debara Pickett on 02/05/2021.  During that time he reported some anginal type symptoms.  He reported compliance with his Ranexa which she felt was helping.  He did note constipation with the medication.  His follow-up in August echocardiogram was canceled.  Plan was made for keeping him on long-term dual antiplatelet therapy.  Annual follow-up was planned.  He presents to the clinic today for follow-up evaluation states he feels well.  He denies chest pain.  We reviewed his  last visit 6/22.  His main complaint today is with vertigo.  This is managed by his PCP.  His blood pressure is well controlled at 110/56.  His EKG shows sinus bradycardia with chronic left bundle branch block 60 bpm.  I will have him maintain his physical activity as tolerated, continue current medication regimen and plan follow-up in 6 months.  Today he denies chest pain, shortness of breath, lower extremity edema, fatigue, palpitations, melena, hematuria, hemoptysis, diaphoresis, weakness, presyncope, syncope, orthopnea, and PND.   Home Medications    Prior to Admission medications    Medication Sig Start Date End Date Taking? Authorizing Provider  amLODipine (NORVASC) 10 MG tablet Take 1 tablet (10 mg total) by mouth at bedtime. 05/05/13   Pixie Casino, MD  aspirin EC 81 MG tablet Take 81 mg by mouth at bedtime.     [provider]  clopidogrel (PLAVIX) 75 MG tablet TAKE 1 TABLET BY MOUTH EVERY DAY 08/29/20   [name], Nadean Corwin, MD  furosemide (LASIX) 40 MG tablet Take 1 tablet (40 mg total) by mouth daily. 10/09/20 01/07/21  Erlene Quan, PA-C  isosorbide mononitrate (IMDUR) 30 MG 24 hr tablet Take 30 mg by mouth 3 (three) times daily.    [provider]  metoprolol tartrate (LOPRESSOR) 25 MG tablet Take 0.5 tablets (12.5 mg total) by mouth 2 (two) times daily. 10/09/20 01/07/21  Erlene Quan, PA-C  niacin (NIASPAN) 500 MG CR tablet Take 500 mg by mouth at bedtime.    [provider]  nitroGLYCERIN (NITROSTAT) 0.4 MG SL tablet Place 1 tablet (0.4 mg total) under the tongue every 5 (five) minutes x 3 doses as needed. 04/16/17   Pixie Casino, MD  polyethylene glycol powder (GLYCOLAX/MIRALAX) 17 GM/SCOOP powder 1/2 scoop  every other night 05/18/19   [provider]  ranolazine (RANEXA) 500 MG 12 hr tablet TAKE 1 TABLET BY MOUTH TWICE A DAY 05/08/20   [name], Nadean Corwin, MD  rosuvastatin (CRESTOR) 40 MG tablet TAKE 1 TABLET BY MOUTH EVERYDAY AT BEDTIME 10/28/19   [name], Nadean Corwin, MD  tacrolimus (PROTOPIC) 0.1 % ointment APPLY TOPICALLY IN THE MORNING AND AT BEDTIME. 09/21/20   Lavonna Monarch, MD    Family History    Family History  Problem Relation Age of Onset   Cancer Mother    Heart disease Mother    Stroke Mother    Alcohol abuse Father    Depression Father    Alzheimer's disease Father    Heart disease Brother        Heart bypass in [place] Sister    He indicated that his mother is deceased. He indicated that his father is deceased. He indicated that the status of his sister is unknown. He  indicated that his brother is deceased. He indicated that his maternal grandmother is deceased. He indicated that his maternal grandfather is deceased. He indicated that his paternal grandmother is deceased. He indicated that his paternal grandfather is deceased.  Social History    Social History   Socioeconomic History   Marital status: Married    Spouse name: Not on file   Number of children: Not on file   Years of education: Not on file   Highest education level: Not on file  Occupational History   Not on file  Tobacco Use   Smoking status: Never   Smokeless tobacco: Never  Tobacco comments:    rarely smoked when he did smoke   Vaping Use   Vaping Use: Never used  Substance and Sexual Activity   Alcohol use: No    Alcohol/week: 0.0 standard drinks of alcohol   Drug use: No   Sexual activity: Not Currently  Other Topics Concern   Not on file  Social History Narrative   Not on file   Social Determinants of Health   Financial Resource Strain: Not on file  Food Insecurity: Not on file  Transportation Needs: Not on file  Physical Activity: Not on file  Stress: Not on file  Social Connections: Not on file  Intimate Partner Violence: Not on file     Review of Systems    General:  No chills, fever, night sweats or weight changes.  Cardiovascular:  No chest pain, dyspnea on exertion, edema, orthopnea, palpitations, paroxysmal nocturnal dyspnea. Dermatological: No rash, lesions/masses Respiratory: No cough, dyspnea Urologic: No hematuria, dysuria Abdominal:   No nausea, vomiting, diarrhea, bright red blood per rectum, melena, or hematemesis Neurologic:  No visual changes, wkns, changes in mental status. All other systems reviewed and are otherwise negative except as noted above.  Physical Exam    VS:  BP (!) 110/56   Pulse 60   Ht 5' 9.5" (1.765 m)   Wt 156 lb (70.8 kg)   SpO2 99%   BMI 22.71 kg/m  , BMI Body mass index is 22.71 kg/m. GEN: Well nourished, well  developed, in no acute distress. HEENT: normal. Neck: Supple, no JVD, carotid bruits, or masses. Cardiac: RRR, no murmurs, rubs, or gallops. No clubbing, cyanosis, edema.  Radials/DP/PT 2+ and equal bilaterally.  Respiratory:  Respirations regular and unlabored, clear to auscultation bilaterally. GI: Soft, nontender, nondistended, BS + x 4. MS: no deformity or atrophy. Skin: warm and dry, no rash. Neuro:  Strength and sensation are intact. Psych: Normal affect.  Accessory Clinical Findings    Recent Labs: No results found for requested labs within last 365 days.   Recent Lipid Panel    Component Value Date/Time   CHOL 155 03/18/2019 0914   TRIG 214 (H) 03/18/2019 0914   HDL 44 03/18/2019 0914   CHOLHDL 3.5 03/18/2019 0914   CHOLHDL 2.6 04/25/2007 0415   VLDL 25 04/25/2007 0415   LDLCALC 68 03/18/2019 0914    ECG personally reviewed by me today-sinus bradycardia rightward axis deviation left bundle branch block 60 bpm no ectopy.  Echocardiogram 09/23/2018  IMPRESSIONS     1. The left ventricle has normal systolic function of 38-10%. The cavity  size was normal. There is mildly increased left ventricular wall  thickness. Echo evidence of impaired diastolic relaxation Elevated left  ventricular end-diastolic pressure.   2. The right ventricle has normal systolic function. The cavity was  normal. There is no increase in right ventricular wall thickness.   3. The mitral valve is normal in structure.   4. The tricuspid valve is normal in structure.   5. 26 mm Edwards Sapien 3 TAVR bioprosthesis is present.   6. Compared with the echo 11/2017, mean TAVR gradients are unchanged.   7. The pulmonic valve was normal in structure.    Echocardiogram 10/31/2020  IMPRESSIONS     1. Left ventricular ejection fraction, by estimation, is 55 to 60%. The  left ventricle has normal function. Abnormal (paradoxical) septal motion,  consistent with left bundle branch block. There is  moderate left  ventricular hypertrophy. Left ventricular  diastolic parameters are  consistent with Grade II diastolic dysfunction  (pseudonormalization). Elevated left atrial pressure.   2. Right ventricular systolic function is mildly reduced. The right  ventricular size is mildly enlarged. There is normal pulmonary artery  systolic pressure. The estimated right ventricular systolic pressure is  09.2 mmHg.   3. Left atrial size was mildly dilated.   4. Right atrial size was severely dilated.   5. The mitral valve is normal in structure. Trivial mitral valve  regurgitation.   6. Tricuspid valve regurgitation is mild to moderate.   7. There is a 26 mm Sapien prosthetic, stented (TAVR) valve present in  the aortic position. Aortic valve regurgitation is not visualized. Echo  findings are consistent with normal structure and function of the aortic  valve prosthesis. Aortic valve mean  gradient measures 9.0 mmHg.   8. The inferior vena cava is normal in size with greater than 50%  respiratory variability, suggesting right atrial pressure of 3 mmHg.  Assessment & Plan   1.Coronary artery disease, hyperlipidemia-no chest pain and no recent episodes.  He is status post CABG 1996.  Multiple RCA PCI's with his last being in 2013. Continue metoprolol, aspirin, amlodipine, Plavix, Imdur, nitro, rosuvastatin, Ranexa Heart healthy low-sodium diet-salty 6 reviewed Increase physical activity as tolerated     DOE-resolved.  Euvolemic.  Continues to be fairly physically active.  Echocardiogram 10/31/2020 showed normal LVEF, G2 DD, and mildly dilated left atria with severely dilated right atria, trivial mitral valve regurgitation, and mild-moderate tricuspid valve regurgitation, TAVR in correct position and functioning normally. Continue furosemide as needed-for weight increase of 3 pounds overnight or 5 pounds in 1 week. Heart healthy low-sodium diet-salty 6 given Increase physical activity as  tolerated  Sinus bradycardia-EKG today shows sinus bradycardia rightward axis deviation left bundle branch block 60 bpm.  Previously noted fatigue with higher doses of metoprolol.   Continue metoprolol Heart healthy low-sodium diet Maintain physical activity  Left bundle branch block-chronic.  Stable. Continue to monitor  CKD stage III-creatinine 2.970 on 4/23 Follows with [facility] Request  labs  Disposition: Follow-up with Dr. Debara Pickett in 6 months.  Jossie Ng. Jya Hughston NP-C    05/16/2022, 9:48 AM [address] Office [phone] Fax [phone]  Notice: This dictation was prepared with Dragon dictation along with smaller phrase technology. Any transcriptional errors that result from this process are unintentional and may not be corrected upon review.  I spent 14 minutes examining this patient, reviewing medications, and using patient centered shared decision making involving her cardiac care.  Prior to her visit I spent greater than 20 minutes reviewing her past medical history,  medications, and prior cardiac tests.

## 2022-05-16 ENCOUNTER — Encounter: Payer: Self-pay | Admitting: General Practice

## 2022-05-16 ENCOUNTER — Ambulatory Visit: Payer: Medicare PPO | Attending: Physician Assistant | Admitting: General Practice

## 2022-05-16 VITALS — BP 110/56 | HR 60 | Ht 69.5 in | Wt 156.0 lb

## 2022-05-16 DIAGNOSIS — N1831 Chronic kidney disease, stage 3a: Secondary | ICD-10-CM | POA: Diagnosis not present

## 2022-05-16 DIAGNOSIS — R0609 Other forms of dyspnea: Secondary | ICD-10-CM | POA: Diagnosis not present

## 2022-05-16 DIAGNOSIS — R001 Bradycardia, unspecified: Secondary | ICD-10-CM

## 2022-05-16 DIAGNOSIS — Z951 Presence of aortocoronary bypass graft: Secondary | ICD-10-CM

## 2022-05-16 DIAGNOSIS — I447 Left bundle-branch block, unspecified: Secondary | ICD-10-CM

## 2022-05-16 NOTE — Patient Instructions (Signed)
Medication Instructions:  The current medical regimen is effective;  continue present plan and medications as directed. Please refer to the Current Medication list given to you today.  *If you need a refill on your cardiac medications before your next appointment, please call your pharmacy*   Lab Work: NONE  Other Instructions INCREASE HYDRATION  MAKE SURE TO CHANGE POSITIONS SLOWLY  Follow-Up: At [facility], you and your health needs are our priority.  As part of our continuing mission to provide you with exceptional heart care, we have created designated Provider Care Teams.  These Care Teams include your primary Cardiologist (physician) and Advanced Practice Providers (APPs -  Physician Assistants and Nurse Practitioners) who all work together to provide you with the care you need, when you need it.  Your next appointment:   6 month(s)  The format for your next appointment:   In Person  Provider:   Pixie Casino, MD     Important Information About Sugar

## 2022-05-22 ENCOUNTER — Other Ambulatory Visit: Payer: Self-pay | Admitting: Internal Medicine

## 2022-05-29 DIAGNOSIS — I10 Essential (primary) hypertension: Secondary | ICD-10-CM | POA: Diagnosis not present

## 2022-06-13 ENCOUNTER — Other Ambulatory Visit: Payer: Self-pay | Admitting: Internal Medicine

## 2022-07-16 DIAGNOSIS — N184 Chronic kidney disease, stage 4 (severe): Secondary | ICD-10-CM | POA: Diagnosis not present

## 2022-07-16 DIAGNOSIS — I25119 Atherosclerotic heart disease of native coronary artery with unspecified angina pectoris: Secondary | ICD-10-CM | POA: Diagnosis not present

## 2022-07-16 DIAGNOSIS — Z0000 Encounter for general adult medical examination without abnormal findings: Secondary | ICD-10-CM | POA: Diagnosis not present

## 2022-07-16 DIAGNOSIS — I25810 Atherosclerosis of coronary artery bypass graft(s) without angina pectoris: Secondary | ICD-10-CM | POA: Diagnosis not present

## 2022-07-16 DIAGNOSIS — I10 Essential (primary) hypertension: Secondary | ICD-10-CM | POA: Diagnosis not present

## 2022-07-16 DIAGNOSIS — Z23 Encounter for immunization: Secondary | ICD-10-CM | POA: Diagnosis not present

## 2022-07-16 DIAGNOSIS — I739 Peripheral vascular disease, unspecified: Secondary | ICD-10-CM | POA: Diagnosis not present

## 2022-07-16 DIAGNOSIS — D631 Anemia in chronic kidney disease: Secondary | ICD-10-CM | POA: Diagnosis not present

## 2022-07-16 DIAGNOSIS — E782 Mixed hyperlipidemia: Secondary | ICD-10-CM | POA: Diagnosis not present

## 2022-07-20 NOTE — Progress Notes (Unsigned)
Cardiology Clinic Note   Patient Name: [name] Date of Encounter: 07/22/2022  Primary Care Provider:  Deland Pretty, MD Primary Cardiologist:  Steven Casino, MD  Patient Profile    [name] 88 year old male presents the clinic today for follow-up evaluation of his dyspnea and fatigue.  Past Medical History    Past Medical History:  Diagnosis Date   CAD (coronary artery disease)    a. s/p CABG in 1996 and subsequent PCI   Carotid artery disease    a. Rt. CEA, 08/04/07, Lt. CEA 04/26/07.   Chronic kidney disease    CKD (chronic kidney disease) stage 2, GFR 60-89 ml/min 09/16/2011   Coronary artery disease    History of CVA (cerebrovascular accident)    a. 2008   HTN (hypertension),uncontrolled 09/16/2011   Hypertension    LBBB (left bundle branch block)    Melanoma ([name]) 03/28/2019   Right [place] (in situ) [facility])   Peripheral arterial disease ([place])    Pulmonary hypertension, mild 09/17/2011   S/P TAVR (transcatheter aortic valve replacement)    a. 09/15/17: s/p Edwards Sapien 3 THV (size 26 mm, model # 9600TFX, serial # [id number])   SCCA (squamous cell carcinoma) of skin 03/28/2019   Right Shoulder (curet and 5FU)   SCCA (squamous cell carcinoma) of skin 03/28/2019   Right Top Hand (in situ) (curet and 5FU)   Squamous cell carcinoma of skin 05/17/2018   Left Post Shoulder (well diff) ([facility]'s)   Past Surgical History:  Procedure Laterality Date    Fluid knee Right Sept. 6, 2014   Fluid removed   CARDIAC CATHETERIZATION Left 06/10/2012   Ostial RCA 99% stenosis pre-dilation 3x12m Angiosculpt PTCA balloon, 3.5x137mPromus Element DES overlapping proximal stent and into the Aorto-ostium, post-dilated with4x8m26mC Trek balloon - 99% stenosis reduced to 0%   CARDIAC CATHETERIZATION Left 12/22/2011   Left Main-diffuse tapering 50-60% stenosis; mild-moderate diseaseof the right ventricle branch of the right coronary artery; will plan FFR of ostial RCA   CARDIAC  CATHETERIZATION Bilateral 09/16/2011   Severe 90% ostial RCA disease which is calcified, will likely need PCI to ostial RCA with calcification   CARDIAC CATHETERIZATION  09/18/2011   Calcified ostial 90-95% proximal RCA stenosis, 3x12m71merge balloon predilation, 3x12mm65m PROMUS Element stent was placed, post stent dilation done utilizing a 3.25x12mm 62mompliant Quantum Apex balloon, 90-95% stenosis reduced to 0%   CEA  2008   CORONARY ANGIOPLASTY     CORONARY ARTERY BYPASS GRAFT  08/04/2007   FRACTIONAL FLOW RESERVE WIRE  12/22/2011   Ostial RCA, FFR ratio-0.82, physiologically significant, 3.25x10mm F5mome Cutting balloon deployed at ~3.4mm fin60mestimated diameter   FRACTIONAL FLOW RESERVE WIRE Right 12/22/2011   Procedure: FRACTIONAL FLOW RESERVE WIRE;  Surgeon: Steven Steven Casinoocation: [facility];  Service: Cardiovascular;  Laterality: Right;   LEFT AND RIGHT HEART CATHETERIZATION WITH CORONARY ANGIOGRAM Left 09/16/2011   Procedure: LEFT AND RIGHT HEART CATHETERIZATION WITH CORONARY ANGIOGRAM;  Surgeon: Steven Steven Casinoocation[facility];  Service: Cardiovascular;  Laterality: Left;   LEFT HEART CATHETERIZATION WITH CORONARY ANGIOGRAM N/A 06/10/2012   Procedure: LEFT HEART CATHETERIZATION WITH CORONARY ANGIOGRAM;  Surgeon: Steven Steven Casinoocation: [facility];  Service: Cardiovascular;  Laterality: N/A;   LEFT HEART CATHETERIZATION WITH CORONARY/GRAFT ANGIOGRAM N/A 12/22/2011   Procedure: LEFT HEART CATHETERIZATION WITH CORONARYBeatrix [name]: Steven Steven Casinoocation: [facility];  Service: Cardiovascular;  Laterality:  N/A;   PERCUTANEOUS CORONARY ROTOBLATOR INTERVENTION (PCI-R)  09/18/2011   Procedure: PERCUTANEOUS CORONARY ROTOBLATOR INTERVENTION (PCI-R);  Surgeon: Steven Sine, MD;  Location[facility] CATH LAB;  Service: Cardiovascular;;   PERCUTANEOUS CORONARY STENT INTERVENTION (PCI-S) N/A 09/18/2011   Procedure: PERCUTANEOUS CORONARY STENT  INTERVENTION (PCI-S);  Surgeon: Steven Sine, MD;  [facility] C[facility] LAB;  Service: Cardiovascular;  Laterality: N/A;   RIGHT/LEFT HEART CATH AND CORONARY/GRAFT ANGIOGRAPHY N/A 07/30/2017   Procedure: RIGHT/LEFT HEART CATH AND CORONARY/GRAFT ANGIOGRAPHY;  Surgeon: Steven Crome, MD;  Location: [place] CV LAB;  Service: Cardiovascular;  Laterality: N/A;   TEE WITHOUT CARDIOVERSION N/A 09/15/2017   Procedure: TRANSESOPHAGEAL ECHOCARDIOGRAM (TEE);  Surgeon: Steven Blanks, MD;  Location: [place];  Service: Open Heart Surgery;  Laterality: N/A;   TEMPORARY PACEMAKER INSERTION  09/18/2011   Procedure: TEMPORARY PACEMAKER INSERTION;  Surgeon: Steven Sine, MD;  Location: [name] CATH LAB;  Service: Cardiovascular;;   TRANSCATHETER AORTIC VALVE REPLACEMENT, TRANSFEMORAL N/A 09/15/2017   Procedure: TRANSCATHETER AORTIC VALVE REPLACEMENT, TRANSFEMORAL;  Surgeon: Steven Blanks, MD;  Location: [place];  Service: Open Heart Surgery;  Laterality: N/A;    Allergies  Allergies  Allergen Reactions   Atorvastatin Other (See Comments)    Muscle and joint pain, can tolerate rosuvastatin    History of Present Illness    [name] has a PMH of coronary artery disease, chronic renal insufficiency, TAVR 1/19, HTN, PVD, and chronic bradycardia with LBBB.  He underwent CABG in 1996.  He had multiple RCA PCI's with last being in 2013.  Echocardiogram 2/20 showed preserved LV EF with good valve function.  He is noted to have chronic bradycardia with heart rates in the 50s and LBBB.  He was seen by Steven Ransom, PA-C on 10/09/2020.  During that time he reported increasing shortness of breath and fatigue with exertion over the last 6 months.  He reported his symptoms had gradually increased.  At the time of the visit he reported he did not feel like he could do anything.  Previously he reported being active.  He denied syncope.  He was noted to have lower extremity edema.  BNP, CBC, follow-up echocardiogram,  Lopressor was decreased to 12.5 mg twice daily.  Echocardiogram 10/31/2020 showed normal LVEF, G2 DD, and mildly dilated left atria with severely dilated right atria, trivial mitral valve regurgitation, and mild-moderate tricuspid valve regurgitation, TAVR in correct position and functioning normally.  He presented the clinic 11/01/20 for follow-up evaluation stated he was feeling much better.  He noticed increased activity tolerance with his reduction in metoprolol.  He reported he stayed very physically active at home cutting grass and doing chores around his house.  We reviewed his echocardiogram and he expressed understanding.  He reported he just had labs drawn at [facility].  I requested his labs.  We gave him salty 6 diet sheet, had him maintain his physical activity and planned follow-up with Dr. Debara Pickett in 3 months.  He was seen in follow-up by Dr. Debara Pickett on 02/05/2021.  During that time he reported some anginal type symptoms.  He reported compliance with his Ranexa which she felt was helping.  He did note constipation with the medication.  His follow-up in August echocardiogram was canceled.  Plan was made for keeping him on long-term dual antiplatelet therapy.  Annual follow-up was planned.  He presented to the clinic 05/16/2022 for follow-up evaluation stated he feels well.  He denied chest pain.  We reviewed his  last visit 6/22.  His main complaint was with vertigo.  Which was managed by his PCP.  His blood pressure was well controlled at 110/56.  His EKG showed sinus bradycardia with chronic left bundle branch block 60 bpm.  I asked him to maintain his physical activity , continue current medication regimen and planned follow-up in 6 months.  He presents to the clinic today for follow-up evaluation and states his wife passed away on 03-Nov-2022 last week.  Since that time he has noted increased fatigue and weakness.  He is planning a funeral for Friday.  His EKG today shows a heart rate of 38  sinus rhythm second-degree AV block with 2 1 AV conduction right bundle branch block.  He reports that he continues to stay well-hydrated and has good family support.  I will discontinue his metoprolol, repeat his echocardiogram, give him the mindfulness stress reduction sheet, order a CBC and plan follow-up in 1 month.  Today he denies chest pain, shortness of breath, lower extremity edema, fatigue, palpitations, melena, hematuria, hemoptysis, diaphoresis, weakness, presyncope, syncope, orthopnea, and PND.    Home Medications    Prior to Admission medications   Medication Sig Start Date End Date Taking? Authorizing Provider  amLODipine (NORVASC) 10 MG tablet Take 1 tablet (10 mg total) by mouth at bedtime. 05/05/13   Steven Casino, MD  aspirin EC 81 MG tablet Take 81 mg by mouth at bedtime.     [provider]  clopidogrel (PLAVIX) 75 MG tablet TAKE 1 TABLET BY MOUTH EVERY DAY 08/29/20   [name], Nadean Corwin, MD  furosemide (LASIX) 40 MG tablet Take 1 tablet (40 mg total) by mouth daily. 10/09/20 01/07/21  Erlene Quan, PA-C  isosorbide mononitrate (IMDUR) 30 MG 24 hr tablet Take 30 mg by mouth 3 (three) times daily.    [provider]  metoprolol tartrate (LOPRESSOR) 25 MG tablet Take 0.5 tablets (12.5 mg total) by mouth 2 (two) times daily. 10/09/20 01/07/21  Erlene Quan, PA-C  niacin (NIASPAN) 500 MG CR tablet Take 500 mg by mouth at bedtime.    [provider]  nitroGLYCERIN (NITROSTAT) 0.4 MG SL tablet Place 1 tablet (0.4 mg total) under the tongue every 5 (five) minutes x 3 doses as needed. 04/16/17   Steven Casino, MD  polyethylene glycol powder (GLYCOLAX/MIRALAX) 17 GM/SCOOP powder 1/2 scoop  every other night 05/18/19   [provider]  ranolazine (RANEXA) 500 MG 12 hr tablet TAKE 1 TABLET BY MOUTH TWICE A DAY 05/08/20   [name], Nadean Corwin, MD  rosuvastatin (CRESTOR) 40 MG tablet TAKE 1 TABLET BY MOUTH EVERYDAY AT BEDTIME 10/28/19   [name], Nadean Corwin, MD   tacrolimus (PROTOPIC) 0.1 % ointment APPLY TOPICALLY IN THE MORNING AND AT BEDTIME. 09/21/20   Lavonna Monarch, MD    Family History    Family History  Problem Relation Age of Onset   Cancer Mother    Heart disease Mother    Stroke Mother    Alcohol abuse Father    Depression Father    Alzheimer's disease Father    Heart disease Brother        Heart bypass in [place] Sister    He indicated that his mother is deceased. He indicated that his father is deceased. He indicated that the status of his sister is unknown. He indicated that his brother is deceased. He indicated that  his maternal grandmother is deceased. He indicated that his maternal grandfather is deceased. He indicated that his paternal grandmother is deceased. He indicated that his paternal grandfather is deceased.  Social History    Social History   Socioeconomic History   Marital status: Married    Spouse name: Not on file   Number of children: Not on file   Years of education: Not on file   Highest education level: Not on file  Occupational History   Not on file  Tobacco Use   Smoking status: Never   Smokeless tobacco: Never   Tobacco comments:    rarely smoked when he did smoke   Vaping Use   Vaping Use: Never used  Substance and Sexual Activity   Alcohol use: No    Alcohol/week: 0.0 standard drinks of alcohol   Drug use: No   Sexual activity: Not Currently  Other Topics Concern   Not on file  Social History Narrative   Not on file   Social Determinants of Health   Financial Resource Strain: Not on file  Food Insecurity: Not on file  Transportation Needs: Not on file  Physical Activity: Not on file  Stress: Not on file  Social Connections: Not on file  Intimate Partner Violence: Not on file     Review of Systems    General:  No chills, fever, night sweats or weight changes.  Cardiovascular:  No chest pain, dyspnea on exertion, edema, orthopnea,  palpitations, paroxysmal nocturnal dyspnea. Dermatological: No rash, lesions/masses Respiratory: No cough, dyspnea Urologic: No hematuria, dysuria Abdominal:   No nausea, vomiting, diarrhea, bright red blood per rectum, melena, or hematemesis Neurologic:  No visual changes, wkns, changes in mental status. All other systems reviewed and are otherwise negative except as noted above.  Physical Exam    VS:  BP 132/76   Pulse (!) 38   Ht '5\' 9"'$  (1.753 m)   Wt 169 lb (76.7 kg)   SpO2 99%   BMI 24.96 kg/m  , BMI Body mass index is 24.96 kg/m. GEN: Well nourished, well developed, in no acute distress. HEENT: normal. Neck: Supple, no JVD, carotid bruits, or masses. Cardiac: RRR, no murmurs, rubs, or gallops. No clubbing, cyanosis, edema.  Radials/DP/PT 2+ and equal bilaterally.  Respiratory:  Respirations regular and unlabored, clear to auscultation bilaterally. GI: Soft, nontender, nondistended, BS + x 4. MS: no deformity or atrophy. Skin: warm and dry, no rash. Neuro:  Strength and sensation are intact. Psych: Normal affect.  Accessory Clinical Findings    Recent Labs: No results found for requested labs within last 365 days.   Recent Lipid Panel    Component Value Date/Time   CHOL 155 03/18/2019 0914   TRIG 214 (H) 03/18/2019 0914   HDL 44 03/18/2019 0914   CHOLHDL 3.5 03/18/2019 0914   CHOLHDL 2.6 04/25/2007 0415   VLDL 25 04/25/2007 0415   LDLCALC 68 03/18/2019 0914    ECG personally reviewed by me today-sinus rhythm with second-degree AV block 2 1 AV conduction right bundle branch block 38 bpm  EKG 05/16/2022 sinus bradycardia rightward axis deviation left bundle branch block 60 bpm no ectopy.  Echocardiogram 09/23/2018  IMPRESSIONS     1. The left ventricle has normal systolic function of 02-40%. The cavity  size was normal. There is mildly increased left ventricular wall  thickness. Echo evidence of impaired diastolic relaxation Elevated left  ventricular  end-diastolic pressure.   2. The right ventricle has normal systolic function. The cavity  was  normal. There is no increase in right ventricular wall thickness.   3. The mitral valve is normal in structure.   4. The tricuspid valve is normal in structure.   5. 26 mm Edwards Sapien 3 TAVR bioprosthesis is present.   6. Compared with the echo 11/2017, mean TAVR gradients are unchanged.   7. The pulmonic valve was normal in structure.    Echocardiogram 10/31/2020  IMPRESSIONS     1. Left ventricular ejection fraction, by estimation, is 55 to 60%. The  left ventricle has normal function. Abnormal (paradoxical) septal motion,  consistent with left bundle branch block. There is moderate left  ventricular hypertrophy. Left ventricular  diastolic parameters are consistent with Grade II diastolic dysfunction  (pseudonormalization). Elevated left atrial pressure.   2. Right ventricular systolic function is mildly reduced. The right  ventricular size is mildly enlarged. There is normal pulmonary artery  systolic pressure. The estimated right ventricular systolic pressure is  69.6 mmHg.   3. Left atrial size was mildly dilated.   4. Right atrial size was severely dilated.   5. The mitral valve is normal in structure. Trivial mitral valve  regurgitation.   6. Tricuspid valve regurgitation is mild to moderate.   7. There is a 26 mm Sapien prosthetic, stented (TAVR) valve present in  the aortic position. Aortic valve regurgitation is not visualized. Echo  findings are consistent with normal structure and function of the aortic  valve prosthesis. Aortic valve mean  gradient measures 9.0 mmHg.   8. The inferior vena cava is normal in size with greater than 50%  respiratory variability, suggesting right atrial pressure of 3 mmHg.  Assessment & Plan   Acute on chronic diastolic CHF-NYHA class III.  Reports increased fatigue and weakness.  Wife passed away last week and funeral is on Friday.   Euvolemic today.  Echocardiogram 10/31/2020 showed normal LVEF, G2 DD, and mildly dilated left atria with severely dilated right atria, trivial mitral valve regurgitation, and mild-moderate tricuspid valve regurgitation, TAVR in correct position and functioning normally. Continue daily weights-May take furosemide for weight increase of 2 to 3 pounds overnight or 5 pounds in 1 week Maintain weight log Heart healthy low-sodium diet-salty 6 reviewed and given Request labs from [facility] Repeat echocardiogram   Coronary artery disease, hyperlipidemia-no chest pain and no recent episodes.  He is status post CABG 1996.  Multiple RCA PCI's with his last being in 2013. Continue metoprolol, aspirin, amlodipine, Plavix, Imdur, nitro, rosuvastatin, Ranexa Heart healthy low-sodium diet-salty 6 reviewed Increase physical activity as tolerated    Sinus bradycardia-heart rate today 38.    Reports increased fatigue and weakness  Discontinue metoprolol Heart healthy low-sodium diet Maintain physical activity Order CBC  Left bundle branch block-chronic.  Denies lightheadedness, presyncope or syncope.  EKG today shows sinus rhythm with first-degree AV block 2-1 AV conduction right bundle branch block 38 bpm Stop metoprolol Continue to monitor  CKD stage III-creatinine 2.970 on 4/23 Follows with [facility]   Disposition: Follow-up with Dr. Debara Pickett or me in 2-3 weeks.  Jossie Ng. Cynda Soule NP-C    07/22/2022, 2:18 PM [address] [place] Office [phone] Fax [phone]  Notice: This dictation was prepared with Dragon dictation along with smaller phrase technology. Any transcriptional errors that result from this process are unintentional and may not be corrected upon review.  I spent 14 minutes examining this patient, reviewing medications, and using patient centered shared decision making involving  her cardiac care.  Prior to her visit I spent  greater than 20 minutes reviewing her past medical history,  medications, and prior cardiac tests.

## 2022-07-22 ENCOUNTER — Ambulatory Visit: Payer: Medicare PPO | Attending: General Practice | Admitting: General Practice

## 2022-07-22 ENCOUNTER — Encounter: Payer: Self-pay | Admitting: General Practice

## 2022-07-22 VITALS — BP 132/76 | HR 38 | Ht 69.0 in | Wt 169.0 lb

## 2022-07-22 DIAGNOSIS — R531 Weakness: Secondary | ICD-10-CM | POA: Diagnosis not present

## 2022-07-22 DIAGNOSIS — R5383 Other fatigue: Secondary | ICD-10-CM

## 2022-07-22 DIAGNOSIS — I2510 Atherosclerotic heart disease of native coronary artery without angina pectoris: Secondary | ICD-10-CM

## 2022-07-22 DIAGNOSIS — I5032 Chronic diastolic (congestive) heart failure: Secondary | ICD-10-CM | POA: Diagnosis not present

## 2022-07-22 DIAGNOSIS — I10 Essential (primary) hypertension: Secondary | ICD-10-CM

## 2022-07-22 DIAGNOSIS — R001 Bradycardia, unspecified: Secondary | ICD-10-CM

## 2022-07-22 DIAGNOSIS — Z951 Presence of aortocoronary bypass graft: Secondary | ICD-10-CM | POA: Diagnosis not present

## 2022-07-22 DIAGNOSIS — Z9861 Coronary angioplasty status: Secondary | ICD-10-CM

## 2022-07-22 LAB — CBC
Hematocrit: 27.5 % — ABNORMAL LOW (ref 37.5–51.0)
Hemoglobin: 9.1 g/dL — ABNORMAL LOW (ref 13.0–17.7)
MCH: 31.4 pg (ref 26.6–33.0)
MCHC: 33.1 g/dL (ref 31.5–35.7)
MCV: 95 fL (ref 79–97)
Platelets: 121 10*3/uL — ABNORMAL LOW (ref 150–450)
RBC: 2.9 x10E6/uL — ABNORMAL LOW (ref 4.14–5.80)
RDW: 14.2 % (ref 11.6–15.4)
WBC: 5.3 10*3/uL (ref 3.4–10.8)

## 2022-07-22 NOTE — Patient Instructions (Signed)
Medication Instructions:  STOP METOPROLOL  *If you need a refill on your cardiac medications before your next appointment, please call your pharmacy*   Lab Work: CBC TODAY If you have labs (blood work) drawn today and your tests are completely normal, you will receive your results only by:  [place] (if you have MyChart) OR  A paper copy in the mail  If you have any lab test that is abnormal or we need to change your treatment, we will call you to review the results.   Testing/Procedures: Echocardiogram - Your physician has requested that you have an echocardiogram. Echocardiography is a painless test that uses sound waves to create images of your heart. It provides your doctor with information about the size and shape of your heart and how well your heart's chambers and valves are working. This procedure takes approximately one hour. There are no restrictions for this procedure.    Follow-Up: At [facility], you and your health needs are our priority.  As part of our continuing mission to provide you with exceptional heart care, we have created designated Provider Care Teams.  These Care Teams include your primary Cardiologist (physician) and Advanced Practice Providers (APPs -  Physician Assistants and Nurse Practitioners) who all work together to provide you with the care you need, when you need it.  Your next appointment:   AFTER ECHO   The format for your next appointment:   In Person  Provider:   Pixie Casino, MD  or Coletta Memos, FNP       Other Instructions PLEASE READ AND FOLLOW STRESS REDUCTION TIPS  Important Information About Sugar        Mindfulness-Based Stress Reduction Mindfulness-based stress reduction (MBSR) is a program that helps people learn to practice mindfulness. Mindfulness is the practice of consciously paying attention to the present moment. MBSR focuses on developing self-awareness, which lets you respond to life stress without  judgment or negative feelings. It can be learned and practiced through techniques such as education, breathing exercises, meditation, and yoga. MBSR includes several mindfulness techniques in one program. MBSR works best when you understand the treatment, are willing to try new things, and can commit to spending time practicing what you learn. MBSR training may include learning about: How your feelings, thoughts, and reactions affect your body. New ways to respond to things that cause negative thoughts to start (triggers). How to notice your thoughts and let go of them. Practicing awareness of everyday things that you normally do without thinking. The techniques and goals of different types of meditation. What are the benefits of MBSR? MBSR can have many benefits, which include helping you to: Develop self-awareness. This means knowing and understanding yourself. Learn skills and attitudes that help you to take part in your own health care. Learn new ways to care for yourself. Be more accepting about how things are, and let things go. Be less judgmental and approach things with an open mind. Be patient with yourself and trust yourself more. MBSR has also been shown to: Reduce negative emotions, such as sadness, overwhelm, and worry. Improve memory and focus. Change how you sense and react to pain. Boost your body's ability to fight infections. Help you connect better with other people. Improve your sense of well-being. How to practice mindfulness To do a basic awareness exercise: Find a comfortable place to sit. Pay attention to the present moment. Notice your thoughts, feelings, and surroundings just as they are. Avoid judging yourself, your  feelings, or your surroundings. Make note of any judgment that comes up and let it go. Your mind may wander, and that is okay. Make note of when your thoughts drift, and return your attention to the present moment. To do basic mindfulness  meditation: Find a comfortable place to sit. This may include a stable chair or a firm floor cushion. Sit upright with your back straight. Let your arms fall next to your sides, with your hands resting on your legs. If you are sitting in a chair, rest your feet flat on the floor. If you are sitting on a cushion, cross your legs in front of you. Keep your head in a neutral position with your chin dropped slightly. Relax your jaw and rest the tip of your tongue on the roof of your mouth. Drop your gaze to the floor or close your eyes. Breathe normally and pay attention to your breath. Feel the air moving in and out of your nose. Feel your belly expanding and relaxing with each breath. Your mind may wander, and that is okay. Make note of when your thoughts drift, and return your attention to your breath. Avoid judging yourself, your feelings, or your surroundings. Make note of any judgment or feelings that come up, let them go, and bring your Follow these instructions at home:  Find a local in-person or online MBSR program. Set aside some time regularly for mindfulness practice. Practice every day if you can. Even 10 minutes of practice is helpful. Find a mindfulness practice that works best for you. This may include one or more of the following: Meditation. This involves focusing your mind on a certain thought or activity. Breathing awareness exercises. These help you to stay present by focusing on your breath. Body scan. For this practice, you lie down and pay attention to each part of your body from head to toe. You can identify tension and soreness and consciously relax parts of your body. Yoga. Yoga involves stretching and breathing, and it can improve your ability to move and be flexible. It can also help you to test your body's limits, which can help you release stress. Mindful eating. This way of eating involves focusing on the taste, texture, color, and smell of each bite of food. This slows  down eating and helps you feel full sooner. For this reason, it can be an important part of a weight loss plan. Find a podcast or recording that provides guidance for breathing awareness, body scan, or meditation exercises. You can listen to these any time when you have a free moment to rest without distractions. Follow your treatment plan as told by your health care provider. This may include taking regular medicines and making changes to your diet or lifestyle as recommended. Where to find more information You can find more information about MBSR from: Your health care provider. Community-based meditation centers or programs. Programs offered near you. Summary Mindfulness-based stress reduction (MBSR) is a program that teaches you how to consciously pay attention to the present moment. It is used to help you deal better with daily stress, feelings, and pain. MBSR focuses on developing self-awareness, which allows you to respond to life stress without judgment or negative feelings. MBSR programs may involve learning different mindfulness practices, such as breathing exercises, meditation, yoga, body scan, or mindful eating. Find a mindfulness practice that works best for you, and set aside time for it on a regular basis. This information is not intended to replace advice given to you  by your health care provider. Make sure you discuss any questions you have with your health care provider. Document Revised: 03/14/2021 Document Reviewed: 03/14/2021 Elsevier Patient Education  [place].

## 2022-07-29 ENCOUNTER — Telehealth: Payer: Self-pay | Admitting: Internal Medicine

## 2022-07-29 ENCOUNTER — Other Ambulatory Visit: Payer: Self-pay

## 2022-07-29 DIAGNOSIS — D649 Anemia, unspecified: Secondary | ICD-10-CM

## 2022-07-29 NOTE — Telephone Encounter (Signed)
Pt c/o Shortness Of Breath: STAT if SOB developed within the last 24 hours or pt is noticeably SOB on the phone  1. Are you currently SOB (can you hear that pt is SOB on the phone)?  No   2. How long have you been experiencing SOB?  Started about 2 weeks ago  3. Are you SOB when sitting or when up moving around?  When up and moving and around  4. Are you currently experiencing any other symptoms?  Has vertigo, not currently No symptoms right now

## 2022-07-29 NOTE — Telephone Encounter (Signed)
Will route to our scheduling team to see if an earlier ECHO is available-   PCP notified of lab work results, sent via epic.   GI referral sent.

## 2022-07-29 NOTE — Telephone Encounter (Signed)
Patient called in stating that he is experiencing some SOB- he is fatigue, and has been having lots of issues with dizziness (which he states could be his vertigo) after review of his chart see recent CBC result:  Please contact [name] and let him know that his lab work from yesterday has been reviewed.  His hemoglobin is noted to be 9.1 and his hematocrit is also low at 27.5.  He did not note any bleeding issues yesterday when questioned during office visit.  His low hemoglobin and hematocrit are probably contributing to his fatigue.  Please refer him to GI for possible endoscopy/colonoscopy.  Also, please have him follow-up with his PCP for further treatment and evaluation.  Thank you.    I did explain how the low numbers could contribute to some of his symptoms, he had thoughts of moving up his ECHO which is scheduled for 01/02. I advised I would see what Dr.[name] recommended. He denies any bleeding still at this time.   I did order referral to GI as recommended- and will send results to PCP as well.

## 2022-07-29 NOTE — Telephone Encounter (Signed)
I'm not sure it is possible to get an earlier echo, but would not hurt to try. A GI evaluation is reasonable given the anemia - there could be other causes as well.  Will notify PCP to assist in work-up.  Dr Debara Pickett

## 2022-07-30 ENCOUNTER — Other Ambulatory Visit: Payer: Self-pay

## 2022-08-01 ENCOUNTER — Encounter (HOSPITAL_COMMUNITY): Payer: Self-pay | Admitting: Emergency Medicine

## 2022-08-01 ENCOUNTER — Telehealth: Payer: Self-pay | Admitting: Internal Medicine

## 2022-08-01 ENCOUNTER — Inpatient Hospital Stay (HOSPITAL_COMMUNITY)
Admission: EM | Admit: 2022-08-01 | Discharge: 2022-08-05 | DRG: 242 | Disposition: A | Discharge status: Home / Self Care | Payer: Medicare PPO | Attending: Family Medicine | Admitting: Family Medicine

## 2022-08-01 ENCOUNTER — Emergency Department (HOSPITAL_COMMUNITY): Payer: Medicare PPO

## 2022-08-01 DIAGNOSIS — N179 Acute kidney failure, unspecified: Secondary | ICD-10-CM | POA: Diagnosis present

## 2022-08-01 DIAGNOSIS — I5033 Acute on chronic diastolic (congestive) heart failure: Secondary | ICD-10-CM | POA: Diagnosis present

## 2022-08-01 DIAGNOSIS — N189 Chronic kidney disease, unspecified: Secondary | ICD-10-CM | POA: Diagnosis not present

## 2022-08-01 DIAGNOSIS — I071 Rheumatic tricuspid insufficiency: Secondary | ICD-10-CM | POA: Diagnosis present

## 2022-08-01 DIAGNOSIS — I10 Essential (primary) hypertension: Secondary | ICD-10-CM | POA: Diagnosis present

## 2022-08-01 DIAGNOSIS — Z7982 Long term (current) use of aspirin: Secondary | ICD-10-CM | POA: Diagnosis not present

## 2022-08-01 DIAGNOSIS — Z951 Presence of aortocoronary bypass graft: Secondary | ICD-10-CM

## 2022-08-01 DIAGNOSIS — I2510 Atherosclerotic heart disease of native coronary artery without angina pectoris: Secondary | ICD-10-CM

## 2022-08-01 DIAGNOSIS — I442 Atrioventricular block, complete: Principal | ICD-10-CM | POA: Diagnosis present

## 2022-08-01 DIAGNOSIS — J90 Pleural effusion, not elsewhere classified: Secondary | ICD-10-CM | POA: Diagnosis not present

## 2022-08-01 DIAGNOSIS — Z7902 Long term (current) use of antithrombotics/antiplatelets: Secondary | ICD-10-CM | POA: Diagnosis not present

## 2022-08-01 DIAGNOSIS — Z8249 Family history of ischemic heart disease and other diseases of the circulatory system: Secondary | ICD-10-CM | POA: Diagnosis not present

## 2022-08-01 DIAGNOSIS — D631 Anemia in chronic kidney disease: Secondary | ICD-10-CM | POA: Diagnosis present

## 2022-08-01 DIAGNOSIS — Z9861 Coronary angioplasty status: Secondary | ICD-10-CM | POA: Diagnosis not present

## 2022-08-01 DIAGNOSIS — Z79899 Other long term (current) drug therapy: Secondary | ICD-10-CM | POA: Diagnosis not present

## 2022-08-01 DIAGNOSIS — Z952 Presence of prosthetic heart valve: Secondary | ICD-10-CM | POA: Diagnosis not present

## 2022-08-01 DIAGNOSIS — Z950 Presence of cardiac pacemaker: Secondary | ICD-10-CM | POA: Diagnosis not present

## 2022-08-01 DIAGNOSIS — Z8673 Personal history of transient ischemic attack (TIA), and cerebral infarction without residual deficits: Secondary | ICD-10-CM | POA: Diagnosis not present

## 2022-08-01 DIAGNOSIS — R001 Bradycardia, unspecified: Secondary | ICD-10-CM | POA: Diagnosis not present

## 2022-08-01 DIAGNOSIS — I130 Hypertensive heart and chronic kidney disease with heart failure and stage 1 through stage 4 chronic kidney disease, or unspecified chronic kidney disease: Secondary | ICD-10-CM | POA: Diagnosis present

## 2022-08-01 DIAGNOSIS — Z85828 Personal history of other malignant neoplasm of skin: Secondary | ICD-10-CM

## 2022-08-01 DIAGNOSIS — Z888 Allergy status to other drugs, medicaments and biological substances status: Secondary | ICD-10-CM | POA: Diagnosis not present

## 2022-08-01 DIAGNOSIS — I5032 Chronic diastolic (congestive) heart failure: Secondary | ICD-10-CM | POA: Insufficient documentation

## 2022-08-01 DIAGNOSIS — R42 Dizziness and giddiness: Secondary | ICD-10-CM | POA: Diagnosis not present

## 2022-08-01 DIAGNOSIS — Z85820 Personal history of malignant melanoma of skin: Secondary | ICD-10-CM | POA: Diagnosis not present

## 2022-08-01 DIAGNOSIS — M7989 Other specified soft tissue disorders: Secondary | ICD-10-CM | POA: Diagnosis not present

## 2022-08-01 DIAGNOSIS — N1832 Chronic kidney disease, stage 3b: Secondary | ICD-10-CM | POA: Diagnosis present

## 2022-08-01 DIAGNOSIS — I129 Hypertensive chronic kidney disease with stage 1 through stage 4 chronic kidney disease, or unspecified chronic kidney disease: Secondary | ICD-10-CM | POA: Diagnosis not present

## 2022-08-01 DIAGNOSIS — N1831 Chronic kidney disease, stage 3a: Secondary | ICD-10-CM | POA: Diagnosis not present

## 2022-08-01 DIAGNOSIS — I739 Peripheral vascular disease, unspecified: Secondary | ICD-10-CM | POA: Diagnosis not present

## 2022-08-01 DIAGNOSIS — N184 Chronic kidney disease, stage 4 (severe): Secondary | ICD-10-CM

## 2022-08-01 DIAGNOSIS — R0602 Shortness of breath: Secondary | ICD-10-CM | POA: Diagnosis not present

## 2022-08-01 DIAGNOSIS — N183 Chronic kidney disease, stage 3 unspecified: Secondary | ICD-10-CM | POA: Diagnosis present

## 2022-08-01 LAB — BASIC METABOLIC PANEL
Anion gap: 10 (ref 5–15)
BUN: 40 mg/dL — ABNORMAL HIGH (ref 8–23)
CO2: 19 mmol/L — ABNORMAL LOW (ref 22–32)
Calcium: 8.6 mg/dL — ABNORMAL LOW (ref 8.9–10.3)
Chloride: 112 mmol/L — ABNORMAL HIGH (ref 98–111)
Creatinine, Ser: 3.03 mg/dL — ABNORMAL HIGH (ref 0.61–1.24)
GFR, Estimated: 19 mL/min — ABNORMAL LOW (ref >60)
Glucose, Bld: 142 mg/dL — ABNORMAL HIGH (ref 70–99)
Potassium: 4.3 mmol/L (ref 3.5–5.1)
Sodium: 141 mmol/L (ref 135–145)

## 2022-08-01 LAB — CBC
HCT: 27.2 % — ABNORMAL LOW (ref 39.0–52.0)
Hemoglobin: 9 g/dL — ABNORMAL LOW (ref 13.0–17.0)
MCH: 31.8 pg (ref 26.0–34.0)
MCHC: 33.1 g/dL (ref 30.0–36.0)
MCV: 96.1 fL (ref 80.0–100.0)
Platelets: 101 10*3/uL — ABNORMAL LOW (ref 150–400)
RBC: 2.83 MIL/uL — ABNORMAL LOW (ref 4.22–5.81)
RDW: 16.3 % — ABNORMAL HIGH (ref 11.5–15.5)
WBC: 5 10*3/uL (ref 4.0–10.5)
nRBC: 0 % (ref 0.0–0.2)

## 2022-08-01 LAB — TROPONIN I (HIGH SENSITIVITY)
Troponin I (High Sensitivity): 58 ng/L — ABNORMAL HIGH (ref <18)
Troponin I (High Sensitivity): 56 ng/L — ABNORMAL HIGH (ref <18)

## 2022-08-01 LAB — BRAIN NATRIURETIC PEPTIDE: B Natriuretic Peptide: 727.4 pg/mL — ABNORMAL HIGH (ref 0.0–100.0)

## 2022-08-01 LAB — POC OCCULT BLOOD, ED: Fecal Occult Bld: NEGATIVE

## 2022-08-01 MED ORDER — NIACIN ER (ANTIHYPERLIPIDEMIC) 500 MG PO TBCR
500.0000 mg | EXTENDED_RELEASE_TABLET | Freq: Every day | ORAL | Status: DC
  Administered 2022-08-01 – 2022-08-04 (×4): 500 mg via ORAL
  Filled 2022-08-01 (×5): qty 1

## 2022-08-01 MED ORDER — AMLODIPINE BESYLATE 10 MG PO TABS
10.0000 mg | ORAL_TABLET | Freq: Every day | ORAL | Status: DC
  Administered 2022-08-01 – 2022-08-04 (×4): 10 mg via ORAL
  Filled 2022-08-01 (×3): qty 1
  Filled 2022-08-01: qty 2

## 2022-08-01 MED ORDER — ENOXAPARIN SODIUM 30 MG/0.3ML IJ SOSY
30.0000 mg | PREFILLED_SYRINGE | INTRAMUSCULAR | Status: DC
  Administered 2022-08-01 – 2022-08-03 (×3): 30 mg via SUBCUTANEOUS
  Filled 2022-08-01 (×3): qty 0.3

## 2022-08-01 MED ORDER — ROSUVASTATIN CALCIUM 20 MG PO TABS
40.0000 mg | ORAL_TABLET | Freq: Every day | ORAL | Status: DC
  Administered 2022-08-02 – 2022-08-05 (×4): 40 mg via ORAL
  Filled 2022-08-01 (×4): qty 2

## 2022-08-01 MED ORDER — RANOLAZINE ER 500 MG PO TB12
500.0000 mg | ORAL_TABLET | Freq: Two times a day (BID) | ORAL | Status: DC
  Administered 2022-08-01 – 2022-08-02 (×2): 500 mg via ORAL
  Filled 2022-08-01 (×2): qty 1

## 2022-08-01 MED ORDER — FUROSEMIDE 40 MG PO TABS
40.0000 mg | ORAL_TABLET | Freq: Every day | ORAL | Status: DC
  Administered 2022-08-01 – 2022-08-04 (×4): 40 mg via ORAL
  Filled 2022-08-01 (×4): qty 1
  Filled 2022-08-01: qty 2

## 2022-08-01 MED ORDER — ISOSORBIDE MONONITRATE ER 30 MG PO TB24
30.0000 mg | ORAL_TABLET | Freq: Three times a day (TID) | ORAL | Status: DC
  Administered 2022-08-02 – 2022-08-05 (×10): 30 mg via ORAL
  Filled 2022-08-01 (×10): qty 1

## 2022-08-01 MED ORDER — ASPIRIN 81 MG PO TBEC
81.0000 mg | DELAYED_RELEASE_TABLET | Freq: Every day | ORAL | Status: DC
  Administered 2022-08-01 – 2022-08-04 (×4): 81 mg via ORAL
  Filled 2022-08-01 (×4): qty 1

## 2022-08-01 MED ORDER — CLOPIDOGREL BISULFATE 75 MG PO TABS
75.0000 mg | ORAL_TABLET | Freq: Every day | ORAL | Status: DC
  Administered 2022-08-02 – 2022-08-03 (×2): 75 mg via ORAL
  Filled 2022-08-01 (×2): qty 1

## 2022-08-01 NOTE — Assessment & Plan Note (Signed)
-  creatinine of 3.03. Pt follows with nephrology outpatient. No prior labs to compare for baseline. Continue to monitor.

## 2022-08-01 NOTE — ED Provider Triage Note (Signed)
Emergency Medicine Provider Triage Evaluation Note  [name] , a 88 y.o. male  was evaluated in triage.  Pt complains of worsening shortness of breath over the last 3 weeks, previous history of hypertension, severe aortic stenosis, left bundle branch block, he is status post transcatheter aortic valve replacement he does have a history of CVA, chronic heart failure, he has been taking Lasix as directed but reports that he has continued to feel short of breath.  Additionally he is newly anemic according to his doctor, he denies any dark tarry stools or gross blood in stool or urine.  He denies any other sources of blood loss.  He reports shortness of breath is worse with exertion, denies orthopnea, PND.  He recently lost his wife of 60+ years 2 weeks ago but reports he is at peace with that.  He is DNR but would except blood products. Endorses intermittent chest pain.  Review of Systems  Positive: Shortness of breath, weakness, chest pain Negative: Fever, chills  Physical Exam  BP (!) 148/57 (BP Location: Right Arm)   Pulse (!) 39   Temp (!) 97.3 F (36.3 C) (Oral)   Resp 18   SpO2 99%  Gen:   Somewhat dusky appearing grossly, pale at extremities Resp:  Normal effort  MSK:   Moves extremities without difficulty  Other:    Medical Decision Making  Medically screening exam initiated at 4:07 PM.  Appropriate orders placed.  [name] was informed that the remainder of the evaluation will be completed by another provider, this initial triage assessment does not replace that evaluation, and the importance of remaining in the ED until their evaluation is complete.  Workup initiated   [name], [place] 08/01/22 1610

## 2022-08-01 NOTE — Telephone Encounter (Signed)
LMTCB regarding J. Cleaver recommendation.

## 2022-08-01 NOTE — Telephone Encounter (Signed)
Informed patient of Dr. Lysbeth Penner answer: "Unlikely to get the echo sooner - if his shortness of breath is significantly worse or progressing, he may want to be seen in urgent care or the ER." Patient stated if he needs to go to ED, it will be at [facility]

## 2022-08-01 NOTE — Assessment & Plan Note (Signed)
-  presented with symptomatic bradycardia. Remains hemodynamically stable.  -will have pacer pads on stand-by but no need for temporary pacing at this time -cardiology has evaluated and he will likely undergo pacemaker placement sometime after the weekend

## 2022-08-01 NOTE — Telephone Encounter (Signed)
Pt would like a call back to discuss his condition he states. He says that he has gotten worse and can not wait until echo that is scheduled. Requesting call back.  He stated before I lost connection that his chest has been hurting. Called patient back to get clarification.  Pt c/o of Chest Pain: STAT if CP now or developed within 24 hours  1. Are you having CP right now? When moving around   2. Are you experiencing any other symptoms (ex. SOB, nausea, vomiting, sweating)? SOB when he moves too much   3. How long have you been experiencing CP? For a few weeks   4. Is your CP continuous or coming and going? Coming and going based on movement   5. Have you taken Nitroglycerin? No.  ?

## 2022-08-01 NOTE — ED Triage Notes (Addendum)
Pt reports sob past 3 weeks. Pt appears dusky and cyanotic but is alert and oriented x 4. He has extensive heart hx and takes lasix. Pt reports he has been losing weight but legs have pitting edema. Doctor also called reporting he is anemic but unsure where from and needs echo.  Pt is a DNR but needs form. Pt just lost wife of 60 plus 2 weeks ago. He reports he is at peace with it.

## 2022-08-01 NOTE — H&P (Signed)
History and Physical    Patient: [name] JIR:[id number] DOB: 04-09-1934 DOA: 08/01/2022 DOS: the patient was seen and examined on 08/01/2022 PCP: Deland Pretty, MD  Patient coming from: Home  Chief Complaint:  Chief Complaint  Patient presents with   Shortness of Breath        HPI: [name] is a 88 y.o. male with medical history significant of CAD s/p CABG, carotid artery disease, s/p TAVR, CKD stage II, history of CVA, hypertension, chronic bradycardia with LBBB, PAD, pulmonary hypertension, squamous cell carcinoma who presents with symptomatic bradycardia.  Patient reportedly at baseline has chronic bradycardia with heart rate in the 50s.  Per cardiology outpatient documentation, he has had increasing shortness of breath and fatigue with exertion since 09/2020 and at that time had decrease of his Lopressor to 12.5 mg twice daily.  He gradually had improvement in his symptoms and had follow-up echo on 10/31/2020 with normal LVEF, mildly dilated left atrial with severely dilated right atrial, trivial mitral valve regurgitation, mild-moderate tricuspid valve regurgitation and TAVR in correct position and functioning normally. He presented for follow-up at cardiology on 07/22/2022 reports increasing fatigue and weakness following the death of his wife a week prior.  EKG obtained revealed a heart rate of 38in sinus rhythm with 2:1 AV block.  Metoprolol was discontinued at that time. Echo was ordered but pt called cardiology office today due to worsening exertional dyspnea with chest tightness over short distances and was advised to present to ED.   In the ED, HR in the 30s with stable BP. No significant electrolyte abnormality other than creatinine of 3 with unknown baseline. EKG concerning for complete heart block. He was evaluated by cardiology who recommend likely pacemaker placement this admission.    Review of Systems: As mentioned in the history of present illness. All other systems  reviewed and are negative. Past Medical History:  Diagnosis Date   CAD (coronary artery disease)    a. s/p CABG in 1996 and subsequent PCI   Carotid artery disease    a. Rt. CEA, 08/04/07, Lt. CEA 04/26/07.   CKD (chronic kidney disease) stage 2, GFR 60-89 ml/min 09/16/2011   Coronary artery disease    History of CVA (cerebrovascular accident)    a. 2008   HTN (hypertension),uncontrolled 09/16/2011   LBBB (left bundle branch block)    Melanoma ([place]) 03/28/2019   Right [place] (in situ) [facility])   Peripheral arterial disease ([place])    Pulmonary hypertension, mild 09/17/2011   S/P TAVR (transcatheter aortic valve replacement)    a. 09/15/17: s/p Edwards Sapien 3 THV (size 26 mm, model # 9600TFX, serial # [id number])   SCCA (squamous cell carcinoma) of skin 03/28/2019   Right Shoulder (curet and 5FU)   SCCA (squamous cell carcinoma) of skin 03/28/2019   Right Top Hand (in situ) (curet and 5FU)   Squamous cell carcinoma of skin 05/17/2018   Left Post Shoulder (well diff) ([facility])   Past Surgical History:  Procedure Laterality Date    Fluid knee Right Sept. 6, 2014   Fluid removed   CARDIAC CATHETERIZATION Left 06/10/2012   Ostial RCA 99% stenosis pre-dilation 3x53m Angiosculpt PTCA balloon, 3.5x178mPromus Element DES overlapping proximal stent and into the Aorto-ostium, post-dilated with4x8m57mC Trek balloon - 99% stenosis reduced to 0%   CARDIAC CATHETERIZATION Left 12/22/2011   Left Main-diffuse tapering 50-60% stenosis; mild-moderate diseaseof the right ventricle branch of the right coronary artery; will plan FFR of ostial RCA   CARDIAC  CATHETERIZATION Bilateral 09/16/2011   Severe 90% ostial RCA disease which is calcified, will likely need PCI to ostial RCA with calcification   CARDIAC CATHETERIZATION  09/18/2011   Calcified ostial 90-95% proximal RCA stenosis, 3x32m Emerge balloon predilation, 3x134mDES PROMUS Element stent was placed, post stent dilation done utilizing a 3.25x1246mnoncompliant Quantum Apex balloon, 90-95% stenosis reduced to 0%   CEA  2008   CORONARY ANGIOPLASTY     CORONARY ARTERY BYPASS GRAFT  08/04/2007   FRACTIONAL FLOW RESERVE WIRE  12/22/2011   Ostial RCA, FFR ratio-0.82, physiologically significant, 3.25x10m21mextome Cutting balloon deployed at ~3.4mm 56mal estimated diameter   FRACTIONAL FLOW RESERVE WIRE Right 12/22/2011   Procedure: FRACTSavoongargeon: KennePixie Casino  [facility];  Service: Cardiovascular;  Laterality: Right;   LEFT AND RIGHT HEART CATHETERIZATION WITH CORONARY ANGIOGRAM Left 09/16/2011   Procedure: LEFT AND RIGHT HEART CATHETERIZATION WITH CORONARY ANGIOGRAM;  Surgeon: KennePixie Casino  [facility];  Service: Cardiovascular;  Laterality: Left;   LEFT HEART CATHETERIZATION WITH CORONARY ANGIOGRAM N/A 06/10/2012   Procedure: LEFT HEART CATHETERIZATION WITH CORONARY ANGIOGRAM;  Surgeon: KennePixie Casino  [facility];  Service: Cardiovascular;  Laterality: N/A;   LEFT HEART CATHETERIZATION WITH CORONARY/GRAFT ANGIOGRAM N/A 12/22/2011   Procedure: LEFT HEART CATHETERIZATION WITH CORONBeatrix Fettersrgeon: KennePixie Casino  [facility];  Service: Cardiovascular;  Laterality: N/A;   PERCUTANEOUS CORONARY ROTOBLATOR INTERVENTION (PCI-R)  09/18/2011   Procedure: PERCUTANEOUS CORONARY ROTOBLATOR INTERVENTION (PCI-R);  Surgeon: ThomaTroy Sine  [facility];  Service: Cardiovascular;;   PERCUTANEOUS CORONARY STENT INTERVENTION (PCI-S) N/A 09/18/2011   Procedure: PERCUTANEOUS CORONARY STENT INTERVENTION (PCI-S);  Surgeon: ThomaTroy Sine  [facility];  Service: Cardiovascular;  Laterality: N/A;   RIGHT/LEFT HEART CATH AND CORONARY/GRAFT ANGIOGRAPHY N/A 07/30/2017   Procedure: RIGHT/LEFT HEART CATH AND CORONARY/GRAFT ANGIOGRAPHY;  Surgeon: SmithBelva Crome  [facility];  Service: Cardiovascular;  Laterality: N/A;    TEE WITHOUT CARDIOVERSION N/A 09/15/2017   Procedure: TRANSESOPHAGEAL ECHOCARDIOGRAM (TEE);  Surgeon: McAlhBurnell Blanks  [facility] Open [facility] Surgery;  Laterality: N/A;   TEMPORARY PACEMAKER INSERTION  09/18/2011   Procedure: TEMPORARY PACEMAKER INSERTION;  Surgeon: ThomaTroy Sine  [facility];  Service: Cardiovascular;;   TRANSCATHETER AORTIC VALVE REPLACEMENT, TRANSFEMORAL N/A 09/15/2017   Procedure: TRANSCATHETER AORTIC VALVE REPLACEMENT, TRANSFEMORAL;  Surgeon: McAlhBurnell Blanks  [facility];  Laterality: N/A;   Social History:  reports that he has never smoked. He has never used smokeless tobacco. He reports that he does not drink alcohol and does not use drugs.  Allergies  Allergen Reactions   Atorvastatin Other (See Comments)    Muscle and joint pain, can tolerate rosuvastatin    Family History  Problem Relation Age of Onset   Cancer Mother    Heart disease Mother    Stroke Mother    Alcohol abuse Father    Depression Father    Alzheimer's disease Father    Heart disease Brother        Heart [place] in [place]     Prior to Admission medications   Medication Sig Start Date End Date Taking? Authorizing Provider  amLODipine (NORVASC) 10 MG  tablet Take 1 tablet (10 mg total) by mouth at bedtime. 05/05/13  Yes Pixie Casino, MD  aspirin EC 81 MG tablet Take 81 mg by mouth at bedtime.    Yes [provider]  clopidogrel (PLAVIX) 75 MG tablet Take 1 tablet (75 mg total) by mouth daily. 06/13/22  Yes [name], Nadean Corwin, MD  furosemide (LASIX) 40 MG tablet Take 1 tablet (40 mg total) by mouth daily. 10/24/21 08/01/22 Yes [name], Nadean Corwin, MD  isosorbide mononitrate (IMDUR) 30 MG 24 hr tablet Take 30 mg by mouth 3 (three) times daily.   Yes [provider]  niacin (NIASPAN) 500 MG CR tablet Take 500 mg by mouth at bedtime.   Yes  [provider]  nitroGLYCERIN (NITROSTAT) 0.4 MG SL tablet Place 1 tablet (0.4 mg total) under the tongue every 5 (five) minutes x 3 doses as needed. 04/16/17  Yes [name], Nadean Corwin, MD  polyethylene glycol powder (GLYCOLAX/MIRALAX) 17 GM/SCOOP powder Take 0.5 Containers by mouth daily. 05/18/19  Yes [provider]  ranolazine (RANEXA) 500 MG 12 hr tablet TAKE 1 TABLET BY MOUTH TWICE A DAY 05/22/22  Yes [name], Nadean Corwin, MD  rosuvastatin (CRESTOR) 40 MG tablet TAKE 1 TABLET (40 MG TOTAL) BY MOUTH DAILY. SCHEDULE AN APPOINTMENT FOR FURTHER REFILLS, 1ST ATTEMPT 03/31/22  Yes [name], Nadean Corwin, MD  tacrolimus (PROTOPIC) 0.1 % ointment APPLY 1 APPLICATION TOPICALLY IN THE MORNING AND AT BEDTIME DAILY 10/16/21  Yes Lavonna Monarch, MD    Physical Exam: Vitals:   08/01/22 1815 08/01/22 1830 08/01/22 1845 08/01/22 1900  BP: (!) 132/59 (!) 148/60 (!) 140/76 (!) 148/57  Pulse: (!) 36 (!) 37 (!) 36 (!) 37  Resp: (!) '21 11 17 17  '$ Temp:      TempSrc:      SpO2: 99% 98% 97% 99%   Constitutional: NAD, calm, comfortable, elderly male sitting upright in bed Eyes:  lids and conjunctivae normal ENMT: Mucous membranes are moist.  Neck: normal, supple Respiratory: clear to auscultation bilaterally, no wheezing, no crackles. Normal respiratory effort. No accessory muscle use.  Cardiovascular: bradycardia, no murmurs / rubs / gallops. No extremity edema.  Abdomen: no tenderness,  Bowel sounds positive.  Musculoskeletal: no clubbing / cyanosis. No joint deformity upper and lower extremities. Good ROM, no contractures. Normal muscle tone.  Skin: no rashes, lesions, ulcers. No induration Neurologic: CN 2-12 grossly intact.  Strength 5/5 in all 4.  Psychiatric: Normal judgment and insight. Alert and oriented x 3. Normal mood. Data Reviewed:  SEE HPI  Assessment and Plan: * Complete heart block ([place]) -presented with symptomatic bradycardia. Remains hemodynamically stable.  -will have pacer pads  on stand-by but no need for temporary pacing at this time -cardiology has evaluated and he will likely undergo pacemaker placement sometime after the weekend  Essential hypertension -continue home antihypertensives  CKD (chronic kidney disease) stage 3, GFR 30-59 ml/min (HCC) -creatinine of 3.03. Pt follows with nephrology outpatient. No prior labs to compare for baseline. Continue to monitor.  Acute on chronic diastolic heart failure ([place]) -will obtain echocardiogram per cardiology. They would also like for him to remain on PO Lasix rather than being aggressively diuresis given his CKD.   History of CVA (cerebrovascular accident) -continue on aspirin and plavix  CAD S/P percutaneous coronary angioplasty -s/p CABG -no acute chest pain. Continue DAPT.      Advance Care Planning:   Code Status: Full Code   Consults: Cardiology  Family Communication: daughter and a  friend at bedside  Severity of Illness: The appropriate patient status for this patient is INPATIENT. Inpatient status is judged to be reasonable and necessary in order to provide the required intensity of service to ensure the patient's safety. The patient's presenting symptoms, physical exam findings, and initial radiographic and laboratory data in the context of their chronic comorbidities is felt to place them at high risk for further clinical deterioration. Furthermore, it is not anticipated that the patient will be medically stable for discharge from the hospital within 2 midnights of admission.   * I certify that at the point of admission it is my clinical judgment that the patient will require inpatient hospital care spanning beyond 2 midnights from the point of admission due to high intensity of service, high risk for further deterioration and high frequency of surveillance required.*  Author: Orene Desanctis, DO 08/01/2022 9:41 PM  For on call review [email or web address].

## 2022-08-01 NOTE — Assessment & Plan Note (Addendum)
-  s/p CABG -no acute chest pain. Continue DAPT.

## 2022-08-01 NOTE — Assessment & Plan Note (Signed)
-  will obtain echocardiogram per cardiology. They would also like for him to remain on PO Lasix rather than being aggressively diuresis given his CKD.

## 2022-08-01 NOTE — Assessment & Plan Note (Signed)
-  continue on aspirin and plavix

## 2022-08-01 NOTE — Telephone Encounter (Addendum)
Patient stated he has exertional SOB and dizziness sometimes with it. He has not BP cuff to give numbers. He asked if his scheduled echo for 08/19/22 can be moved closer. He also wants to know if he should come back to the clinic for evaluation. He was here on 12/5.

## 2022-08-01 NOTE — Assessment & Plan Note (Signed)
-  continue home antihypertensives

## 2022-08-01 NOTE — ED Provider Notes (Signed)
[facility] EMERGENCY DEPARTMENT Provider Note   CSN: [id number] Arrival date & time: 08/01/22  1542     History  Chief Complaint  Patient presents with   Shortness of Breath         [name] is a 88 y.o. male.  Pt is a 88 yo male with a pmhx significant for CKD, CAD, HTN, pulmonary hypertension, cva, carotid artery disease, aortic stenosis s/p AV replacement, and skin cancer.  Pt said he's been sob since Thanksgiving.  He feels dizzy.  He is normally able to walk without problems, but is now unable to walk across the room without getting sob.  No f/c.  No cough.  He did have some blood work recently which showed anemia.  He denies bloody or black stools.  He saw his heart doctor on 12/5 and HR was in the 30s at the office.  He was told to stop his metoprolol which he's done.         Home Medications Prior to Admission medications   Medication Sig Start Date End Date Taking? Authorizing Provider  amLODipine (NORVASC) 10 MG tablet Take 1 tablet (10 mg total) by mouth at bedtime. 05/05/13  Yes Pixie Casino, MD  aspirin EC 81 MG tablet Take 81 mg by mouth at bedtime.    Yes [provider]  clopidogrel (PLAVIX) 75 MG tablet Take 1 tablet (75 mg total) by mouth daily. 06/13/22  Yes [name], Nadean Corwin, MD  furosemide (LASIX) 40 MG tablet Take 1 tablet (40 mg total) by mouth daily. 10/24/21 08/01/22 Yes [name], Nadean Corwin, MD  isosorbide mononitrate (IMDUR) 30 MG 24 hr tablet Take 30 mg by mouth 3 (three) times daily.   Yes [provider]  niacin (NIASPAN) 500 MG CR tablet Take 500 mg by mouth at bedtime.   Yes [provider]  nitroGLYCERIN (NITROSTAT) 0.4 MG SL tablet Place 1 tablet (0.4 mg total) under the tongue every 5 (five) minutes x 3 doses as needed. 04/16/17  Yes [name], Nadean Corwin, MD  polyethylene glycol powder (GLYCOLAX/MIRALAX) 17 GM/SCOOP powder Take 0.5 Containers by mouth daily. 05/18/19  Yes [provider]   ranolazine (RANEXA) 500 MG 12 hr tablet TAKE 1 TABLET BY MOUTH TWICE A DAY 05/22/22  Yes [name], Nadean Corwin, MD  rosuvastatin (CRESTOR) 40 MG tablet TAKE 1 TABLET (40 MG TOTAL) BY MOUTH DAILY. SCHEDULE AN APPOINTMENT FOR FURTHER REFILLS, 1ST ATTEMPT 03/31/22  Yes [name], Nadean Corwin, MD  tacrolimus (PROTOPIC) 0.1 % ointment APPLY 1 APPLICATION TOPICALLY IN THE MORNING AND AT BEDTIME DAILY 10/16/21  Yes Lavonna Monarch, MD      Allergies    Atorvastatin    Review of Systems   Review of Systems  Respiratory:  Positive for shortness of breath.   Neurological:  Positive for dizziness.  All other systems reviewed and are negative.   Physical Exam Updated Vital Signs BP (!) 148/57   Pulse (!) 37   Temp (!) 97.3 F (36.3 C) (Oral)   Resp 17   SpO2 99%  Physical Exam Vitals and nursing note reviewed.  Constitutional:      Appearance: He is well-developed. He is ill-appearing.  HENT:     Head: Normocephalic and atraumatic.     Mouth/Throat:     Mouth: Mucous membranes are moist.     Pharynx: Oropharynx is clear.  Eyes:     Extraocular Movements: Extraocular movements intact.     Pupils: Pupils are equal,  round, and reactive to light.  Cardiovascular:     Rate and Rhythm: Regular rhythm. Bradycardia present.  Pulmonary:     Effort: Pulmonary effort is normal.     Breath sounds: Normal breath sounds.  Abdominal:     General: Bowel sounds are normal.     Palpations: Abdomen is soft.  Genitourinary:    Rectum: Guaiac result negative.  Musculoskeletal:        General: Normal range of motion.     Cervical back: Normal range of motion and neck supple.     Right lower leg: Edema present.     Left lower leg: Edema present.  Skin:    General: Skin is warm.     Capillary Refill: Capillary refill takes less than 2 seconds.  Neurological:     General: No focal deficit present.     Mental Status: He is alert and oriented to person, place, and time.  Psychiatric:        Mood and Affect: Mood  normal.        Behavior: Behavior normal.     ED Results / Procedures / Treatments   Labs (all labs ordered are listed, but only abnormal results are displayed) Labs Reviewed  CBC - Abnormal; Notable for the following components:      Result Value   RBC 2.83 (*)    Hemoglobin 9.0 (*)    HCT 27.2 (*)    RDW 16.3 (*)    Platelets 101 (*)    All other components within normal limits  BASIC METABOLIC PANEL - Abnormal; Notable for the following components:   Chloride 112 (*)    CO2 19 (*)    Glucose, Bld 142 (*)    BUN 40 (*)    Creatinine, Ser 3.03 (*)    Calcium 8.6 (*)    GFR, Estimated 19 (*)    All other components within normal limits  BRAIN NATRIURETIC PEPTIDE - Abnormal; Notable for the following components:   B Natriuretic Peptide 727.4 (*)    All other components within normal limits  TROPONIN I (HIGH SENSITIVITY) - Abnormal; Notable for the following components:   Troponin I (High Sensitivity) 58 (*)    All other components within normal limits  POC OCCULT BLOOD, ED  TROPONIN I (HIGH SENSITIVITY)    EKG SB with HR in the 30s.  LBBB  Radiology DG Chest 2 View  Result Date: 08/01/2022 CLINICAL DATA:  Shortness of breath and dizziness. EXAM: CHEST - 2 VIEW COMPARISON:  Chest x-ray 09/15/2017 FINDINGS: Patient is status post cardiac surgery and TAVR. The heart is mildly enlarged, unchanged. There are minimal atelectatic changes in the lung bases. There is no focal lung consolidation. Small pleural effusions are present. No pneumothorax or acute fracture. IMPRESSION: 1. Mild cardiomegaly. 2. Small pleural effusions. 3. Minimal atelectatic changes in the lung bases. Electronically Signed   By: Ronney Asters M.D.   On: 08/01/2022 17:12    Procedures Procedures    Medications Ordered in ED Medications - No data to display  ED Course/ Medical Decision Making/ A&P                           Medical Decision Making Risk Decision regarding hospitalization.   This  patient presents to the ED for concern of sob, this involves an extensive number of treatment options, and is a complaint that carries with it a high risk of complications and morbidity.  The differential diagnosis includes anemia, bradycardia, chf, cardiac   Co morbidities that complicate the patient evaluation  CKD, CAD, HTN, pulmonary hypertension, cva, carotid artery disease, aortic stenosis s/p AV replacement, and skin cancer   Additional history obtained:  Additional history obtained from epic chart review External records from outside source obtained and reviewed including family   Lab Tests:  I Ordered, and personally interpreted labs.  The pertinent results include:  cbc with hgb 9.0 (hgb 9.1 on 12/5, but it was 11 in 2020).  Pt has been getting infusions of ferraheme ordered by his nephrologist, so I suspect he has anemia from ckd   Imaging Studies ordered:  I ordered imaging studies including cxr  I independently visualized and interpreted imaging which showed  . Mild cardiomegaly.  2. Small pleural effusions.  3. Minimal atelectatic changes in the lung bases.   I agree with the radiologist interpretation   Cardiac Monitoring:  The patient was maintained on a cardiac monitor.  I personally viewed and interpreted the cardiac monitored which showed an underlying rhythm of: sinus brady   Medicines ordered and prescription drug management:   I have reviewed the patients home medicines and have made adjustments as needed   Critical Interventions:  Cards consult   Consultations Obtained:  I requested consultation with cardiology (Dr. Percival Spanish),  and discussed lab and imaging findings as well as pertinent plan - he did see pt in the ED.  He recommends medicine admission with cards as consults. Pt d/w Dr. Flossie Buffy ([facility]) for admission   Problem List / ED Course:  Anemia:  No labs on epic for 3 years prior to cbc done on 12/5.  Pt has been getting infusions of  feraheme ordered by his nephrologist, so I suspect he has anemia from ckd.  He denies black/bloody stools.  He is guaiac neg now. CKD:  no labs to compare for 3 years, but he is followed by nephrology (Dr. Marval Regal). Bradycardia:  pt is symptomatic.  Cards is following  Reevaluation:  After the interventions noted above, I reevaluated the patient and found that they have :improved   Social Determinants of Health:  Lives at home.  His wife died 2 weeks ago.   Dispostion:  After consideration of the diagnostic results and the patients response to treatment, I feel that the patent would benefit from admission.          Final Clinical Impression(s) / ED Diagnoses Final diagnoses:  Symptomatic bradycardia  CKD (chronic kidney disease) stage 4, GFR 15-29 ml/min (HCC)  Anemia due to stage 4 chronic kidney disease ([place])    Rx / DC Orders ED Discharge Orders     None         Isla Pence, MD 08/01/22 1911

## 2022-08-01 NOTE — Consult Note (Signed)
Cardiology Consultation:   Patient ID: [name]; [id number]; January 28, 1934   Admit date: 08/01/2022 Date of Consult: 08/01/2022  Primary Care Provider: Deland Pretty, MD Primary Cardiologist: Steven Casino, MD  Primary Electrophysiologist:  None   Patient Profile:   [name] is a 88 y.o. male with a history of CAD s/p remote CABG in 1996 with subsequent PCI, severe aortic stenosis s/p TAVR in 11/1322, chronic diastolic CHF, chronic bradycardia with LBBB, carotid artery disease s/p left CEA in 04/2007 and right CEA in 07/2007, hypertension, CVA in 2008, and CKD stage III who is being seen 08/01/2022 for the evaluation of bradycardia.   History of Present Illness:   [name] with the above history who is followed by [name].  He has a long history of CAD with remote CABG in 1996 and subsequent PCI.  Most recent ischemic evaluation was a right/left cardiac catheterization in 07/2017 prior to valve surgery which showed severe native CAD but widely patent grafts (LIMA to LAD, SVG to OM1, and SVG to OM2). There was overhang of an ostial stent of the proximal RCA into the right sinus of Valsalva preventing selective engagement. He underwent TAVR for severe aortic stenosis in 08/2017 with Dr. Angelena [name]. Last Echo in 10/2020 showed LVEF of 55-60% with abnormal (paradoxical septal motion) consistent with LBBB, moderate LVH, and grade 2 diastolic dysfunction as well as mildly enlarged RV with mildly reduced systolic function, severe biatrial enlargement (right > left), moderate TR, and a stable TAVR valve with mean gradient of 9 mmHg.   Patient was last seen by [name], on 07/22/2022 at which time he was noted to be significantly bradycardic with rates in the 30s. EKG showed normal sinus rhythm with 2:1 AV block and rate of 38 bpm. He reported increased fatigue and weakness. His wife had passed away the week before and he was planning the funeral. However, he denied any other cardiac  symptoms. His Metoprolol was stopped and Echo was ordered. Patient called our office today and reported exertional dyspnea an dizziness and did not feel like he could be wait to be seen until his next appointment. He was advised to come to the ED.   Upon arrival to the ED, patient appeared dusk and cyanotic but was alert and oriented x4 per triage note. He was bradycardic with rates in the 30s but otherwise vitals stable. SpO2 99% on room air. EKG showed heart block with rate of 39 bpm. Initial high-sensitivity troponin pending at 58. BNP elevated at 727. Chest x-ray showed mild cardiomegaly with small pleural effusion and minimal atelectatic changes in the lung bases but no overt edema. WBC 5.0, Hgb 9.0, Plts 101. Na 141, K 4.3, Glucose 142, BUN 40, Cr 3.03. Cardiology consulted for further evaluation.  He said that he is just been getting more fatigued over the last weeks.  He had no improvement after stopping beta-blocker.  The other day he went to walk 100 yards down to his mailbox and he only made it halfway.  When he came back he was very fatigued.  He did have some chest soreness that was mild.  He sat in the chair and recovered.  He has not had any frank syncope.  He has had increased dyspnea with exertion but he is not describing PND or orthopnea.  He had some mild lower extremity swelling.    Past Medical History:  Diagnosis Date   CAD (coronary artery disease)    a.  s/p CABG in 1996 and subsequent PCI   Carotid artery disease    a. Rt. CEA, 08/04/07, Lt. CEA 04/26/07.   CKD (chronic kidney disease) stage 2, GFR 60-89 ml/min 09/16/2011   Coronary artery disease    History of CVA (cerebrovascular accident)    a. 2008   HTN (hypertension),uncontrolled 09/16/2011   LBBB (left bundle branch block)    Melanoma ([name]) 03/28/2019   Right [place] (in situ) [name])   Peripheral arterial disease ([name])    Pulmonary hypertension, mild 09/17/2011   S/P TAVR (transcatheter aortic valve replacement)     a. 09/15/17: s/p Edwards Sapien 3 THV (size 26 mm, model # 9600TFX, serial # [id number])   SCCA (squamous cell carcinoma) of skin 03/28/2019   Right Shoulder (curet and 5FU)   SCCA (squamous cell carcinoma) of skin 03/28/2019   Right Top Hand (in situ) (curet and 5FU)   Squamous cell carcinoma of skin 05/17/2018   Left Post Shoulder (well diff) (MOH's)    Past Surgical History:  Procedure Laterality Date    Fluid knee Right Sept. 6, 2014   Fluid removed   CARDIAC CATHETERIZATION Left 06/10/2012   Ostial RCA 99% stenosis pre-dilation 3x76m Angiosculpt PTCA balloon, 3.5x164mPromus Element DES overlapping proximal stent and into the Aorto-ostium, post-dilated with4x8m80mC Trek balloon - 99% stenosis reduced to 0%   CARDIAC CATHETERIZATION Left 12/22/2011   Left Main-diffuse tapering 50-60% stenosis; mild-moderate diseaseof the right ventricle branch of the right coronary artery; will plan FFR of ostial RCA   CARDIAC CATHETERIZATION Bilateral 09/16/2011   Severe 90% ostial RCA disease which is calcified, will likely need PCI to ostial RCA with calcification   CARDIAC CATHETERIZATION  09/18/2011   Calcified ostial 90-95% proximal RCA stenosis, 3x12m28merge balloon predilation, 3x12mm43m PROMUS Element stent was placed, post stent dilation done utilizing a 3.25x12mm 36mompliant Quantum Apex balloon, 90-95% stenosis reduced to 0%   CEA  2008   CORONARY ANGIOPLASTY     CORONARY ARTERY BYPASS GRAFT  08/04/2007   FRACTIONAL FLOW RESERVE WIRE  12/22/2011   Ostial RCA, FFR ratio-0.82, physiologically significant, 3.25x10mm F26mome Cutting balloon deployed at ~3.4mm fin63mestimated diameter   FRACTIONAL FLOW RESERVE WIRE Right 12/22/2011   Procedure: FRACTIONAL FLOW RESERVE WIRE;  Surgeon: Steven Steven Casinoocation: [name];  Service: Cardiovascular;  Laterality: Right;   LEFT AND RIGHT HEART CATHETERIZATION WITH CORONARY ANGIOGRAM Left 09/16/2011   Procedure: LEFT AND RIGHT HEART  CATHETERIZATION WITH CORONARY ANGIOGRAM;  Surgeon: Steven Steven Casinoocation[facility];  Service: Cardiovascular;  Laterality: Left;   LEFT HEART CATHETERIZATION WITH CORONARY ANGIOGRAM N/A 06/10/2012   Procedure: LEFT HEART CATHETERIZATION WITH CORONARY ANGIOGRAM;  Surgeon: Steven Steven Casinoocation: [name];  Service: Cardiovascular;  Laterality: N/A;   LEFT HEART CATHETERIZATION WITH CORONARY/GRAFT ANGIOGRAM N/A 12/22/2011   Procedure: LEFT HEART CATHETERIZATION WITH CORONARYBeatrix Fetterson: Steven Steven Casinoocation: [name];  Service: Cardiovascular;  Laterality: N/A;   PERCUTANEOUS CORONARY ROTOBLATOR INTERVENTION (PCI-R)  09/18/2011   Procedure: PERCUTANEOUS CORONARY ROTOBLATOR INTERVENTION (PCI-R);  Surgeon: Thomas ATroy Sineocation: [facility];  Service: Cardiovascular;;   PERCUTANEOUS CORONARY STENT INTERVENTION (PCI-S) N/A 09/18/2011   Procedure: PERCUTANEOUS CORONARY STENT INTERVENTION (PCI-S);  Surgeon: Thomas ATroy Sineocation[facility];  Service: Cardiovascular;  Laterality: N/A;   RIGHT/LEFT HEART CATH AND CORONARY/GRAFT ANGIOGRAPHY N/A 07/30/2017   Procedure: RIGHT/LEFT HEART CATH AND CORONARY/GRAFT  ANGIOGRAPHY;  Surgeon: Belva Crome, MD;  Location: [place] [facility];  Service: Cardiovascular;  Laterality: N/A;   TEE WITHOUT CARDIOVERSION N/A 09/15/2017   Procedure: TRANSESOPHAGEAL ECHOCARDIOGRAM (TEE);  Surgeon: Burnell Blanks, MD;  Location: [facility];  Service: Open Heart Surgery;  Laterality: N/A;   TEMPORARY PACEMAKER INSERTION  09/18/2011   Procedure: TEMPORARY PACEMAKER INSERTION;  Surgeon: Troy Sine, MD;  [facility];  Service: Cardiovascular;;   TRANSCATHETER AORTIC VALVE REPLACEMENT, TRANSFEMORAL N/A 09/15/2017   Procedure: TRANSCATHETER AORTIC VALVE REPLACEMENT, TRANSFEMORAL;  Surgeon: Burnell Blanks, MD;  Location: [place];  Service: Open Heart Surgery;  Laterality: N/A;     Home Medications:   Prior to Admission medications   Medication Sig Start Date End Date Taking? Authorizing Provider  aspirin EC 81 MG tablet Take 81 mg by mouth at bedtime.    Yes [provider]  isosorbide mononitrate (IMDUR) 30 MG 24 hr tablet Take 30 mg by mouth 3 (three) times daily.   Yes [provider]  nitroGLYCERIN (NITROSTAT) 0.4 MG SL tablet Place 1 tablet (0.4 mg total) under the tongue every 5 (five) minutes x 3 doses as needed. 04/16/17  Yes [name], Nadean Corwin, MD  ranolazine (RANEXA) 500 MG 12 hr tablet TAKE 1 TABLET BY MOUTH TWICE A DAY 05/22/22  Yes [name], Nadean Corwin, MD  amLODipine (NORVASC) 10 MG tablet Take 1 tablet (10 mg total) by mouth at bedtime. 05/05/13   [name], Nadean Corwin, MD  clopidogrel (PLAVIX) 75 MG tablet Take 1 tablet (75 mg total) by mouth daily. 06/13/22   [name], Nadean Corwin, MD  furosemide (LASIX) 40 MG tablet Take 1 tablet (40 mg total) by mouth daily. 10/24/21 05/16/22  Steven Casino, MD  niacin (NIASPAN) 500 MG CR tablet Take 500 mg by mouth at bedtime.    [provider]  polyethylene glycol powder (GLYCOLAX/MIRALAX) 17 GM/SCOOP powder 1/2 scoop  every other night 05/18/19   [provider]  rosuvastatin (CRESTOR) 40 MG tablet TAKE 1 TABLET (40 MG TOTAL) BY MOUTH DAILY. SCHEDULE AN APPOINTMENT FOR FURTHER REFILLS, 1ST ATTEMPT 03/31/22   Steven Casino, MD  tacrolimus (PROTOPIC) 0.1 % ointment APPLY 1 APPLICATION TOPICALLY IN THE MORNING AND AT BEDTIME DAILY 10/16/21   Lavonna Monarch, MD    Inpatient Medications: Scheduled Meds:  Continuous Infusions:  PRN Meds:   Allergies:    Allergies  Allergen Reactions   Atorvastatin Other (See Comments)    Muscle and joint pain, can tolerate rosuvastatin    Social History:   Social History   Socioeconomic History   Marital status: Married    Spouse name: Not on file   Number of children: Not on file   Years of education: Not on file   Highest education level: Not on file  Occupational  History   Not on file  Tobacco Use   Smoking status: Never   Smokeless tobacco: Never   Tobacco comments:    rarely smoked when he did smoke   Vaping Use   Vaping Use: Never used  Substance and Sexual Activity   Alcohol use: No    Alcohol/week: 0.0 standard drinks of alcohol   Drug use: No   Sexual activity: Not Currently  Other Topics Concern   Not on file  Social History Narrative   Not on file   Social Determinants of Health   Financial Resource Strain: Not on file  Food Insecurity: Not on file  Transportation Needs: Not  on file  Physical Activity: Not on file  Stress: Not on file  Social Connections: Not on file  Intimate Partner Violence: Not on file    Family History:    Family History  Problem Relation Age of Onset   Cancer Mother    Heart disease Mother    Stroke Mother    Alcohol abuse Father    Depression Father    Alzheimer's disease Father    Heart disease Brother        Heart bypass in 1996   Cancer Brother        Prostate   Cancer Sister      ROS:  Please see the history of present illness.   All other ROS reviewed and negative.     Physical Exam/Data:   Vitals:   08/01/22 1554 08/01/22 1645 08/01/22 1815  BP: (!) 148/57 136/61 (!) 132/59  Pulse: (!) 39 (!) 37 (!) 36  Resp: 18 17 (!) 21  Temp: (!) 97.3 F (36.3 C)    TempSrc: Oral    SpO2: 99% 99% 99%   No intake or output data in the 24 hours ending 08/01/22 1834 There were no vitals filed for this visit. There is no height or weight on file to calculate BMI.  GENERAL:  Mildly chronically ill appearing but in no acute distress HEENT:   Pupils equal round and reactive, fundi not visualized, oral mucosa unremarkable NECK:  No  jugular venous distention, waveform within normal limits, carotid upstroke brisk and symmetric, carotid bruits bruits, no thyromegaly LYMPHATICS:  No cervical, inguinal adenopathy LUNGS:   Clear to auscultation bilaterally BACK:  No CVA tenderness CHEST:    Unremarkable HEART:  PMI not displaced or sustained,S1 and S2 within normal limits, no S3, no S4, no clicks, no rubs, 2 out of 6 apical systolic murmur radiating slightly at the aortic outflow tract, no diastolic murmurs ABD:  Flat, positive bowel sounds normal in frequency in pitch, no bruits, no rebound, no guarding, no midline pulsatile mass, no hepatomegaly, no splenomegaly EXT:  2 plus pulses throughout, mild bilateral lower extremity edema, no cyanosis no clubbing SKIN:  No rashes no nodules NEURO:   Cranial nerves II through XII grossly intact, motor grossly intact throughout PSYCH:    Cognitively intact, oriented to person place and time   EKG:  The EKG was personally reviewed and demonstrates: Complete heart block Telemetry:  Telemetry was personally reviewed and demonstrates: Complete heart block  Relevant CV Studies: Echo pending  Laboratory Data:  Chemistry Recent Labs  Lab 08/01/22 1614  NA 141  K 4.3  CL 112*  CO2 19*  GLUCOSE 142*  BUN 40*  CREATININE 3.03*  CALCIUM 8.6*  GFRNONAA 19*  ANIONGAP 10    No results for input(s): "PROT", "ALBUMIN", "AST", "ALT", "ALKPHOS", "BILITOT" in the last 168 hours. Hematology Recent Labs  Lab 08/01/22 1614  WBC 5.0  RBC 2.83*  HGB 9.0*  HCT 27.2*  MCV 96.1  MCH 31.8  MCHC 33.1  RDW 16.3*  PLT 101*   Cardiac EnzymesNo results for input(s): "TROPONINI" in the last 168 hours. No results for input(s): "TROPIPOC" in the last 168 hours.  BNP Recent Labs  Lab 08/01/22 1614  BNP 727.4*    DDimer No results for input(s): "DDIMER" in the last 168 hours.  Radiology/Studies:  DG Chest 2 View  Result Date: 08/01/2022 CLINICAL DATA:  Shortness of breath and dizziness. EXAM: CHEST - 2 VIEW COMPARISON:  Chest x-ray 09/15/2017 FINDINGS:  Patient is status post cardiac surgery and TAVR. The heart is mildly enlarged, unchanged. There are minimal atelectatic changes in the lung bases. There is no focal lung consolidation. Small  pleural effusions are present. No pneumothorax or acute fracture. IMPRESSION: 1. Mild cardiomegaly. 2. Small pleural effusions. 3. Minimal atelectatic changes in the lung bases. Electronically Signed   By: Ronney Asters M.D.   On: 08/01/2022 17:12    Assessment and Plan:   Bradycardia: Patient has complete heart block.  This has been probably going on for few weeks.  Right now he is hemodynamically stable but very symptomatic.  I do not think he needs temporary pacing but he will need permanent pacing this admission.  I discussed this with the patient and his daughter.  Acute on chronic diastolic HF: The patient will have an echocardiogram.  Given the creatinine and his good oxygenation I would hold off on aggressive diuresis.  I think he can continue his p.o. Lasix.  CAD: He does have some chest discomfort but this may be related to increased effort required for his bradycardia.  Enzymes are minimally elevated.  I would not suggest ischemia workup particular given his renal insufficiency.  CKD IIIB: He is seen by nephrology.  I do not see baseline labs.  Follow creatinine.  Anemia: I do not see baseline CBC.  Has been managed for this with Feraheme.  He was guaiac negative.   For questions or updates, please contact [place] Please consult [email or web address] for contact info under Cardiology/STEMI.   Signed, Minus Breeding, MD  08/01/2022 6:34 PM

## 2022-08-01 NOTE — Telephone Encounter (Signed)
Gave patient recommendation from J. Cleaver to go to the ED and have blood work and possible transfusion. Patient said he would go to [place].

## 2022-08-02 ENCOUNTER — Inpatient Hospital Stay (HOSPITAL_COMMUNITY): Payer: Medicare PPO

## 2022-08-02 ENCOUNTER — Other Ambulatory Visit (HOSPITAL_COMMUNITY): Payer: Medicare PPO

## 2022-08-02 DIAGNOSIS — I5032 Chronic diastolic (congestive) heart failure: Secondary | ICD-10-CM

## 2022-08-02 DIAGNOSIS — I442 Atrioventricular block, complete: Secondary | ICD-10-CM | POA: Diagnosis not present

## 2022-08-02 LAB — BASIC METABOLIC PANEL
Anion gap: 12 (ref 5–15)
BUN: 37 mg/dL — ABNORMAL HIGH (ref 8–23)
CO2: 17 mmol/L — ABNORMAL LOW (ref 22–32)
Calcium: 8.8 mg/dL — ABNORMAL LOW (ref 8.9–10.3)
Chloride: 112 mmol/L — ABNORMAL HIGH (ref 98–111)
Creatinine, Ser: 2.87 mg/dL — ABNORMAL HIGH (ref 0.61–1.24)
GFR, Estimated: 21 mL/min — ABNORMAL LOW (ref >60)
Glucose, Bld: 124 mg/dL — ABNORMAL HIGH (ref 70–99)
Potassium: 4.2 mmol/L (ref 3.5–5.1)
Sodium: 141 mmol/L (ref 135–145)

## 2022-08-02 LAB — CBC
HCT: 28 % — ABNORMAL LOW (ref 39.0–52.0)
Hemoglobin: 9.2 g/dL — ABNORMAL LOW (ref 13.0–17.0)
MCH: 31.9 pg (ref 26.0–34.0)
MCHC: 32.9 g/dL (ref 30.0–36.0)
MCV: 97.2 fL (ref 80.0–100.0)
Platelets: 105 10*3/uL — ABNORMAL LOW (ref 150–400)
RBC: 2.88 MIL/uL — ABNORMAL LOW (ref 4.22–5.81)
RDW: 16.4 % — ABNORMAL HIGH (ref 11.5–15.5)
WBC: 6.4 10*3/uL (ref 4.0–10.5)
nRBC: 0 % (ref 0.0–0.2)

## 2022-08-02 LAB — ECHOCARDIOGRAM COMPLETE
AR max vel: 3.1 cm2
AV Area VTI: 3.32 cm2
AV Area mean vel: 2.81 cm2
AV Mean grad: 11.3 mmHg
AV Peak grad: 23.1 mmHg
Ao pk vel: 2.4 m/s
Area-P 1/2: 4.24 cm2
Height: 69 in
MV VTI: 2.75 cm2
S' Lateral: 3.5 cm
Weight: 2747.81 oz

## 2022-08-02 MED ORDER — RANOLAZINE ER 500 MG PO TB12
500.0000 mg | ORAL_TABLET | Freq: Every day | ORAL | Status: DC
  Administered 2022-08-03 – 2022-08-05 (×3): 500 mg via ORAL
  Filled 2022-08-02 (×3): qty 1

## 2022-08-02 NOTE — Plan of Care (Signed)

## 2022-08-02 NOTE — Progress Notes (Signed)
  Echocardiogram 2D Echocardiogram has been performed.  [name] 08/02/2022, 1:05 PM

## 2022-08-02 NOTE — Progress Notes (Signed)
Electrophysiology Progress Note  Patient Name: [name] Date of Encounter: 08/02/2022 Primary cardiologist: Dr. Debara Pickett  Subjective   Doing pretty well today.  Inpatient Medications    Scheduled Meds:  amLODipine  10 mg Oral QHS   aspirin EC  81 mg Oral QHS   clopidogrel  75 mg Oral Daily   enoxaparin (LOVENOX) injection  30 mg Subcutaneous Q24H   furosemide  40 mg Oral Daily   isosorbide mononitrate  30 mg Oral TID   niacin  500 mg Oral QHS   [START ON 08/03/2022] ranolazine  500 mg Oral Daily   rosuvastatin  40 mg Oral Daily   Continuous Infusions:  PRN Meds:    Vital Signs    Vitals:   08/02/22 0545 08/02/22 0615 08/02/22 0830 08/02/22 1100  BP: (!) 125/55 (!) 134/58  (!) 132/56  Pulse: (!) 39 (!) 40 (!) 39   Resp: '16 17  16  '$ Temp:  97.6 F (36.4 C)  (!) 97.4 F (36.3 C)  TempSrc:  Oral  Oral  SpO2: 96% 98% 99%   Weight:  77.9 kg    Height:  '5\' 9"'$  (1.753 m)      Intake/Output Summary (Last 24 hours) at 08/02/2022 1450 Last data filed at 08/02/2022 1127 Gross per 24 hour  Intake 480 ml  Output 350 ml  Net 130 ml   Filed Weights   08/02/22 0615  Weight: 77.9 kg    Physical Exam    GEN- The patient is well appearing, alert and oriented x 3 today.   Head- normocephalic, atraumatic Lungs- Clear to ausculation bilaterally, normal work of breathing Heart- Regular rate and rhythm, no murmurs, rubs or gallops Extremities- no clubbing, cyanosis, or edema Psych- euthymic mood, full affect   Labs    CBC Recent Labs    08/01/22 1614 08/02/22 0337  WBC 5.0 6.4  HGB 9.0* 9.2*  HCT 27.2* 28.0*  MCV 96.1 97.2  PLT 101* 161*   Basic Metabolic Panel Recent Labs    08/01/22 1614 08/02/22 0337  NA 141 141  K 4.3 4.2  CL 112* 112*  CO2 19* 17*  GLUCOSE 142* 124*  BUN 40* 37*  CREATININE 3.03* 2.87*  CALCIUM 8.6* 8.8*     Cardiac studies     ECG: (personally reviewed) complete heart block with wide, RBBB morphology escape at 39 bpm    Telemetry: (personally reviewed) CHB with stable escape at 39 bpm  TTE: I personally reviewed the images. EF appears hyperdynamic in the setting of bradycardia  Radiology    DG Chest 2 View  Result Date: 08/01/2022 CLINICAL DATA:  Shortness of breath and dizziness. EXAM: CHEST - 2 VIEW COMPARISON:  Chest x-ray 09/15/2017 FINDINGS: Patient is status post cardiac surgery and TAVR. The heart is mildly enlarged, unchanged. There are minimal atelectatic changes in the lung bases. There is no focal lung consolidation. Small pleural effusions are present. No pneumothorax or acute fracture. IMPRESSION: 1. Mild cardiomegaly. 2. Small pleural effusions. 3. Minimal atelectatic changes in the lung bases. Electronically Signed   By: Ronney Asters M.D.   On: 08/01/2022 17:12     Patient Profile     [name] is a 88 y.o. male with a past medical history significant for chronic bradycardia with LBBB, CAD s/p CABG, s/p TAVR, CKD II, bilateral CEAs.  He was admitted for symptomatic bradycardia due to complete heart block.   He was seen in cardiology clinic 12/5. Betablocker was discontinued  at that time.  Assessment & Plan    Complete heart block: no reversible cause. hemodynamically stable. No emergent indication for pacemaker. Will plan for permanent pacemaker placement prior to DC. I discussed the indication for the procedure and the logistics, risks, potential benefit, and after care. I specifically explained that risks include but are not limited to infection, bleeding,damage to blood vessels, lung, and the heart -- but risk of prolonged hospitalization, need for surgery, or the event of stroke, heart attack, or death are low but not zero.  S/p TAVR: official TTE read pending CHFpEF: attributable to bradycardia. Continue furosemide CKD IIIb: creatinine trending down. Hopefully, will improve more with improved perfusion. Deterioration in UOP would be an indication for temporary wire over the  weekend. CAD: minimal elevation of troponin. Low suspicion for ACS. Monitor. No plan for ischemic evaluation this admission due to AKI.       For questions or updates, please contact [place] Please consult [email or web address] for contact info under Cardiology/STEMI.  Signed, Melida Quitter, MD  08/02/2022, 2:50 PM

## 2022-08-02 NOTE — Progress Notes (Signed)
PROGRESS NOTE    [name]  YYT:[id number] DOB: July 11, 1934 DOA: 08/01/2022 PCP: Deland Pretty, MD  Chief Complaint  Patient presents with   Shortness of Breath         Brief Narrative:  [name] is [name] 88 y.o. male with medical history significant of CAD s/p CABG, carotid artery disease, s/p TAVR, CKD stage II, history of CVA, hypertension, chronic bradycardia with LBBB, PAD, pulmonary hypertension, squamous cell carcinoma who presents with symptomatic bradycardia.   Patient reportedly at baseline has chronic bradycardia with heart rate in the 50s.  Per cardiology outpatient documentation, he has had increasing shortness of breath and fatigue with exertion since 09/2020 and at that time had decrease of his Lopressor to 12.5 mg twice daily.  He gradually had improvement in his symptoms and had follow-up echo on 10/31/2020 with normal LVEF, mildly dilated left atrial with severely dilated right atrial, trivial mitral valve regurgitation, mild-moderate tricuspid valve regurgitation and TAVR in correct position and functioning normally. He presented for follow-up at cardiology on 07/22/2022 reports increasing fatigue and weakness following the death of his wife [name] week prior.  EKG obtained revealed [name] heart rate of 38in sinus rhythm with 2:1 AV block.  Metoprolol was discontinued at that time. Echo was ordered but pt called cardiology office today due to worsening exertional dyspnea with chest tightness over short distances and was advised to present to ED.    In the ED, HR in the 30s with stable BP. No significant electrolyte abnormality other than creatinine of 3 with unknown baseline. EKG concerning for complete heart block. He was evaluated by cardiology who recommend likely pacemaker placement this admission.    Assessment & Plan:   Principal Problem:   Complete heart block (HCC) Active Problems:   Essential hypertension   CKD (chronic kidney disease) stage 3, GFR 30-59 ml/min (HCC)    CAD S/P percutaneous coronary angioplasty   History of CVA (cerebrovascular accident)   Acute on chronic diastolic heart failure (HCC)   Hx of CABG  Complete heart block ([place]) -presented with symptomatic bradycardia. Remains hemodynamically stable.  -no emergent indication for pacemaker per cardiology -needs pacemaker placement prior to d/c   Essential hypertension -continue home antihypertensives   AKI on CKD (chronic kidney disease) stage 3, GFR 30-59 ml/min (HCC) - unclear recent baseline - creatinine 1.7 in 2020 - 3 at presentation, will follow - renal US pending - deterioration in UOP would ben indication for temporary wire over wknd   Acute on chronic diastolic heart failure ([place]) -will obtain echocardiogram per cardiology -PO lasix -appreciate cardiology assistance   History of CVA (cerebrovascular accident) -continue on aspirin and plavix   CAD S/P percutaneous coronary angioplasty -s/p CABG -no acute chest pain. Continue DAPT.     DVT prophylaxis: lovenox  Code Status: full Family Communication: none Disposition:   Status is: Inpatient Remains inpatient appropriate because: pending pacemaker placement   Consultants:  cardiology  Procedures:  Echo pending  Antimicrobials:  Anti-infectives (From admission, onward)    None       Subjective: No new complaints  Objective: Vitals:   08/02/22 0615 08/02/22 0830 08/02/22 1100 08/02/22 1542  BP: (!) 134/58  (!) 132/56   Pulse: (!) 40 (!) 39 (!) 38   Resp: '17  16 18  '$ Temp: 97.6 F (36.4 C)  (!) 97.4 F (36.3 C) (!) 97.4 F (36.3 C)  TempSrc: Oral  Oral Oral  SpO2: 98% 99% 98%   Weight: 77.9  kg     Height: '5\' 9"'$  (1.753 m)       Intake/Output Summary (Last 24 hours) at 08/02/2022 1549 Last data filed at 08/02/2022 1400 Gross per 24 hour  Intake 720 ml  Output 350 ml  Net 370 ml   Filed Weights   08/02/22 0615  Weight: 77.9 kg    Examination:  General exam: Appears calm and  comfortable  Respiratory system: unlabored Cardiovascular system: bradycardic Gastrointestinal system: Abdomen is nondistended, soft and nontender. Central nervous system: Alert and oriented. No focal neurological deficits. Extremities: no LEE  Data Reviewed: I have personally reviewed following labs and imaging studies  CBC: Recent Labs  Lab 08/01/22 1614 08/02/22 0337  WBC 5.0 6.4  HGB 9.0* 9.2*  HCT 27.2* 28.0*  MCV 96.1 97.2  PLT 101* 105*    Basic Metabolic Panel: Recent Labs  Lab 08/01/22 1614 08/02/22 0337  NA 141 141  K 4.3 4.2  CL 112* 112*  CO2 19* 17*  GLUCOSE 142* 124*  BUN 40* 37*  CREATININE 3.03* 2.87*  CALCIUM 8.6* 8.8*    GFR: Estimated Creatinine Clearance: 18.5 mL/min ([name]) (by C-G formula based on SCr of 2.87 mg/dL (H)).  Liver Function Tests: No results for input(s): "AST", "ALT", "ALKPHOS", "BILITOT", "PROT", "ALBUMIN" in the last 168 hours.  CBG: No results for input(s): "GLUCAP" in the last 168 hours.   No results found for this or any previous visit (from the past 240 hour(s)).       Radiology Studies: DG Chest 2 View  Result Date: 08/01/2022 CLINICAL DATA:  Shortness of breath and dizziness. EXAM: CHEST - 2 VIEW COMPARISON:  Chest x-ray 09/15/2017 FINDINGS: Patient is status post cardiac surgery and TAVR. The heart is mildly enlarged, unchanged. There are minimal atelectatic changes in the lung bases. There is no focal lung consolidation. Small pleural effusions are present. No pneumothorax or acute fracture. IMPRESSION: 1. Mild cardiomegaly. 2. Small pleural effusions. 3. Minimal atelectatic changes in the lung bases. Electronically Signed   By: Ronney Asters M.D.   On: 08/01/2022 17:12        Scheduled Meds:  amLODipine  10 mg Oral QHS   aspirin EC  81 mg Oral QHS   clopidogrel  75 mg Oral Daily   enoxaparin (LOVENOX) injection  30 mg Subcutaneous Q24H   furosemide  40 mg Oral Daily   isosorbide mononitrate  30 mg Oral TID    niacin  500 mg Oral QHS   [START ON 08/03/2022] ranolazine  500 mg Oral Daily   rosuvastatin  40 mg Oral Daily   Continuous Infusions:   LOS: 1 day    Time spent: over 30 min    Fayrene Helper, MD [facility]   To contact the attending provider between 7A-7P or the covering provider during after hours 7P-7A, please log into the web site [email or web address] and access using universal [place] password for that web site. If you do not have the password, please call the hospital operator.  08/02/2022, 3:49 PM

## 2022-08-03 DIAGNOSIS — I442 Atrioventricular block, complete: Secondary | ICD-10-CM | POA: Diagnosis not present

## 2022-08-03 LAB — COMPREHENSIVE METABOLIC PANEL
ALT: 25 U/L (ref 0–44)
AST: 20 U/L (ref 15–41)
Albumin: 3.2 g/dL — ABNORMAL LOW (ref 3.5–5.0)
Alkaline Phosphatase: 94 U/L (ref 38–126)
Anion gap: 8 (ref 5–15)
BUN: 35 mg/dL — ABNORMAL HIGH (ref 8–23)
CO2: 19 mmol/L — ABNORMAL LOW (ref 22–32)
Calcium: 8.4 mg/dL — ABNORMAL LOW (ref 8.9–10.3)
Chloride: 111 mmol/L (ref 98–111)
Creatinine, Ser: 2.82 mg/dL — ABNORMAL HIGH (ref 0.61–1.24)
GFR, Estimated: 21 mL/min — ABNORMAL LOW (ref >60)
Glucose, Bld: 94 mg/dL (ref 70–99)
Potassium: 4 mmol/L (ref 3.5–5.1)
Sodium: 138 mmol/L (ref 135–145)
Total Bilirubin: 0.7 mg/dL (ref 0.3–1.2)
Total Protein: 5.6 g/dL — ABNORMAL LOW (ref 6.5–8.1)

## 2022-08-03 LAB — CBC WITH DIFFERENTIAL/PLATELET
Abs Immature Granulocytes: 0.02 10*3/uL (ref 0.00–0.07)
Basophils Absolute: 0 10*3/uL (ref 0.0–0.1)
Basophils Relative: 1 %
Eosinophils Absolute: 0.3 10*3/uL (ref 0.0–0.5)
Eosinophils Relative: 6 %
HCT: 25 % — ABNORMAL LOW (ref 39.0–52.0)
Hemoglobin: 8.6 g/dL — ABNORMAL LOW (ref 13.0–17.0)
Immature Granulocytes: 0 %
Lymphocytes Relative: 19 %
Lymphs Abs: 1 10*3/uL (ref 0.7–4.0)
MCH: 32.2 pg (ref 26.0–34.0)
MCHC: 34.4 g/dL (ref 30.0–36.0)
MCV: 93.6 fL (ref 80.0–100.0)
Monocytes Absolute: 0.7 10*3/uL (ref 0.1–1.0)
Monocytes Relative: 13 %
Neutro Abs: 3.1 10*3/uL (ref 1.7–7.7)
Neutrophils Relative %: 61 %
Platelets: 103 10*3/uL — ABNORMAL LOW (ref 150–400)
RBC: 2.67 MIL/uL — ABNORMAL LOW (ref 4.22–5.81)
RDW: 16.2 % — ABNORMAL HIGH (ref 11.5–15.5)
WBC: 5.1 10*3/uL (ref 4.0–10.5)
nRBC: 0 % (ref 0.0–0.2)

## 2022-08-03 LAB — URINALYSIS, ROUTINE W REFLEX MICROSCOPIC
Bilirubin Urine: NEGATIVE
Glucose, UA: NEGATIVE mg/dL
Hgb urine dipstick: NEGATIVE
Ketones, ur: NEGATIVE mg/dL
Leukocytes,Ua: NEGATIVE
Nitrite: NEGATIVE
Protein, ur: 100 mg/dL — AB
Specific Gravity, Urine: 1.014 (ref 1.005–1.030)
pH: 5 (ref 5.0–8.0)

## 2022-08-03 LAB — MAGNESIUM: Magnesium: 2.2 mg/dL (ref 1.7–2.4)

## 2022-08-03 LAB — PHOSPHORUS: Phosphorus: 3.7 mg/dL (ref 2.5–4.6)

## 2022-08-03 NOTE — Progress Notes (Signed)
  Transition of Care [facility]) Screening Note   Patient Details  Name: [name] Date of Birth: 1934/05/02   Transition of Care [facility]) CM/SW Contact:    Bartholomew Crews, RN Phone Number: [phone] 08/03/2022, 3:30 PM    Transition of Care Department [facility]) has reviewed patient and no TOC needs have been identified at this time. We will continue to monitor patient advancement through interdisciplinary progression rounds. If new patient transition needs arise, please place a TOC consult.

## 2022-08-03 NOTE — Progress Notes (Signed)
PROGRESS NOTE    [name]  NLZ:[id number] DOB: 1934-05-29 DOA: 08/01/2022 PCP: Deland Pretty, MD  Chief Complaint  Patient presents with   Shortness of Breath         Brief Narrative:  [name] is [name] 88 y.o. male with medical history significant of CAD s/p CABG, carotid artery disease, s/p TAVR, CKD stage II, history of CVA, hypertension, chronic bradycardia with LBBB, PAD, pulmonary hypertension, squamous cell carcinoma who presents with symptomatic bradycardia.   Patient reportedly at baseline has chronic bradycardia with heart rate in the 50s.  Per cardiology outpatient documentation, he has had increasing shortness of breath and fatigue with exertion since 09/2020 and at that time had decrease of his Lopressor to 12.5 mg twice daily.  He gradually had improvement in his symptoms and had follow-up echo on 10/31/2020 with normal LVEF, mildly dilated left atrial with severely dilated right atrial, trivial mitral valve regurgitation, mild-moderate tricuspid valve regurgitation and TAVR in correct position and functioning normally. He presented for follow-up at cardiology on 07/22/2022 reports increasing fatigue and weakness following the death of his wife Exzavier Ruderman week prior.  EKG obtained revealed Jayvan Mcshan heart rate of 38in sinus rhythm with 2:1 AV block.  Metoprolol was discontinued at that time. Echo was ordered but pt called cardiology office today due to worsening exertional dyspnea with chest tightness over short distances and was advised to present to ED.    In the ED, HR in the 30s with stable BP. No significant electrolyte abnormality other than creatinine of 3 with unknown baseline. EKG concerning for complete heart block. He was evaluated by cardiology who recommend likely pacemaker placement this admission.    Assessment & Plan:   Principal Problem:   Complete heart block (HCC) Active Problems:   Essential hypertension   CKD (chronic kidney disease) stage 3, GFR 30-59 ml/min (HCC)    CAD S/P percutaneous coronary angioplasty   History of CVA (cerebrovascular accident)   Acute on chronic diastolic heart failure (HCC)   Hx of CABG  Complete heart block ([place]) -presented with symptomatic bradycardia. Remains hemodynamically stable.  -no emergent indication for pacemaker per cardiology -needs pacemaker placement prior to d/c - per cards, NPO at [place] tonight   Essential hypertension -continue home antihypertensives   AKI on CKD (chronic kidney disease) stage 3, GFR 30-59 ml/min (HCC) - unclear recent baseline - creatinine 1.7 in 2020 - 3 at presentation, will follow - renal US pending - deterioration in UOP would ben indication for temporary wire over wknd   Acute on chronic diastolic heart failure ([place]) -will obtain echocardiogram per cardiology -PO lasix -appreciate cardiology assistance   History of CVA (cerebrovascular accident) -continue on aspirin and plavix   CAD S/P percutaneous coronary angioplasty -s/p CABG -no acute chest pain. Continue DAPT.     DVT prophylaxis: lovenox  Code Status: full Family Communication: none Disposition:   Status is: Inpatient Remains inpatient appropriate because: pending pacemaker placement   Consultants:  cardiology  Procedures:  Echo pending  Antimicrobials:  Anti-infectives (From admission, onward)    None       Subjective: No complaints  Objective: Vitals:   08/02/22 2249 08/03/22 0424 08/03/22 0823 08/03/22 1434  BP: (!) 137/58 (!) 139/55 (!) 133/57 (!) 143/59  Pulse:  (!) 37 (!) 38 (!) 41  Resp:  '17 16 18  '$ Temp:  97.6 F (36.4 C) 97.6 F (36.4 C) 98.7 F (37.1 C)  TempSrc:  Oral Oral Oral  SpO2:  96%    Weight:  77.1 kg    Height:       No intake or output data in the 24 hours ending 08/03/22 1830  Filed Weights   08/02/22 0615 08/03/22 0424  Weight: 77.9 kg 77.1 kg    Examination:  General: No acute distress. Cardiovascular: bradycardic Lungs: unlabored Neurological: Alert  and oriented 3. Moves all extremities 4 with equal strength. Cranial nerves II through XII grossly intact. Extremities: No clubbing or cyanosis. No edema.  Data Reviewed: I have personally reviewed following labs and imaging studies  CBC: Recent Labs  Lab 08/01/22 1614 08/02/22 0337 08/03/22 0127  WBC 5.0 6.4 5.1  NEUTROABS  --   --  3.1  HGB 9.0* 9.2* 8.6*  HCT 27.2* 28.0* 25.0*  MCV 96.1 97.2 93.6  PLT 101* 105* 103*    Basic Metabolic Panel: Recent Labs  Lab 08/01/22 1614 08/02/22 0337 08/03/22 0127  NA 141 141 138  K 4.3 4.2 4.0  CL 112* 112* 111  CO2 19* 17* 19*  GLUCOSE 142* 124* 94  BUN 40* 37* 35*  CREATININE 3.03* 2.87* 2.82*  CALCIUM 8.6* 8.8* 8.4*  MG  --   --  2.2  PHOS  --   --  3.7    GFR: Estimated Creatinine Clearance: 18.8 mL/min (Brailen Macneal) (by C-G formula based on SCr of 2.82 mg/dL (H)).  Liver Function Tests: Recent Labs  Lab 08/03/22 0127  AST 20  ALT 25  ALKPHOS 94  BILITOT 0.7  PROT 5.6*  ALBUMIN 3.2*    CBG: No results for input(s): "GLUCAP" in the last 168 hours.   No results found for this or any previous visit (from the past 240 hour(s)).       Radiology Studies: US RENAL  Result Date: 08/02/2022 CLINICAL DATA:  Acute renal insufficiency EXAM: RENAL / URINARY TRACT ULTRASOUND COMPLETE COMPARISON:  None Available. FINDINGS: Right Kidney: Renal measurements: 9.6 x 4.6 x 4.5 cm = volume: 106 mL. Echogenicity within normal limits. No mass or hydronephrosis visualized. Left Kidney: Renal measurements: 11.4 x 5.3 x 4 1 cm = volume: 129 mL. Echogenicity within normal limits. No mass or hydronephrosis visualized. Bladder: Appears normal for degree of bladder distention. Other: None. IMPRESSION: No cause for acute renal insufficiency identified. Electronically Signed   By: Dorise Bullion III M.D.   On: 08/02/2022 18:12   ECHOCARDIOGRAM COMPLETE  Result Date: 08/02/2022    ECHOCARDIOGRAM REPORT   Patient Name:   [name] Date of  Exam: 08/02/2022 Medical Rec #:  [id number]    Height:       69.0 in Accession #:    [id number]   Weight:       171.7 lb Date of Birth:  07/30/1934    BSA:          1.936 m Patient Age:    34 years     BP:           132/56 mmHg Patient Gender: M            HR:           38 bpm. Exam Location:  Inpatient Procedure: 2D Echo, 3D Echo, Cardiac Doppler and Color Doppler Indications:    I50.40* Unspecified combined systolic (congestive) and diastolic                 (congestive) heart failure  History:        Patient has prior history of Echocardiogram examinations, most  recent 10/31/2020. CHF, CAD, Abnormal ECG and Prior CABG, Stroke,                 Aortic Valve Disease, Arrythmias:Bradycardia and LBBB,                 Signs/Symptoms:Edema, Shortness of Breath and Dyspnea; Risk                 Factors:Hypertension. Aortic stenosis. TAVR.                 Aortic Valve: 26 mm Sapien prosthetic, stented (TAVR) valve is                 present in the aortic position. Procedure Date: 10/31/2020.  Sonographer:    Roseanna Rainbow RDCS Referring Phys: [id number] [place]  1. Left ventricular ejection fraction, by estimation, is 60 to 65%. The left ventricle has normal function. The left ventricle has no regional wall motion abnormalities. Left ventricular diastolic parameters are consistent with Grade II diastolic dysfunction (pseudonormalization).  2. Right ventricular systolic function is mildly reduced. The right ventricular size is moderately enlarged. Pulmonary artery systolic pressures cannot be calculated due to rapid pressure equilibration between right atrium and right ventricle from severe TR.  3. Right atrial size was severely dilated.  4. The mitral valve is grossly normal. Mild mitral valve regurgitation. No evidence of mitral stenosis.  5. The tricuspid valve is abnormal. Tricuspid valve regurgitation is severe.  6. The aortic valve has been repaired/replaced. Aortic valve regurgitation is not  visualized. No aortic stenosis is present. There is Tremaine Earwood 26 mm Sapien prosthetic (TAVR) valve present in the aortic position. Procedure Date: 10/31/2020. Echo findings are consistent with normal structure and function of the aortic valve prosthesis.  7. Systolic flow reversal in hepatic veins consistent with severe tricuspid regurgitation. The inferior vena cava is dilated in size with <50% respiratory variability, suggesting right atrial pressure of 15 mmHg. Comparison(s): Changes from prior study are noted. TR is severe. FINDINGS  Left Ventricle: Left ventricular ejection fraction, by estimation, is 60 to 65%. The left ventricle has normal function. The left ventricle has no regional wall motion abnormalities. The left ventricular internal cavity size was normal in size. There is  no left ventricular hypertrophy. Abnormal (paradoxical) septal motion, consistent with left bundle branch block. Left ventricular diastolic parameters are consistent with Grade II diastolic dysfunction (pseudonormalization). Right Ventricle: The right ventricular size is moderately enlarged. No increase in right ventricular wall thickness. Right ventricular systolic function is mildly reduced. The tricuspid regurgitant velocity is 2.65 m/s, and with an assumed right atrial pressure of 8 mmHg, the estimated right ventricular systolic pressure is 23.7 mmHg. Left Atrium: Left atrial size was normal in size. Right Atrium: Right atrial size was severely dilated. Pericardium: There is no evidence of pericardial effusion. Mitral Valve: The mitral valve is grossly normal. Mild mitral valve regurgitation. No evidence of mitral valve stenosis. MV peak gradient, 10.1 mmHg. The mean mitral valve gradient is 2.0 mmHg. Tricuspid Valve: The tricuspid valve is abnormal. Tricuspid valve regurgitation is severe. No evidence of tricuspid stenosis. Aortic Valve: The aortic valve has been repaired/replaced. Aortic valve regurgitation is not visualized. No aortic  stenosis is present. Aortic valve mean gradient measures 11.3 mmHg. Aortic valve peak gradient measures 23.1 mmHg. Aortic valve area, by VTI measures 3.32 cm. There is Valeta Paz 26 mm Sapien prosthetic, stented (TAVR) valve present in the aortic position. Procedure Date: 10/31/2020. Echo findings are consistent  with normal structure and function of the aortic valve prosthesis. Pulmonic Valve: The pulmonic valve was not well visualized. Pulmonic valve regurgitation is not visualized. No evidence of pulmonic stenosis. Aorta: The aortic root is normal in size and structure. Venous: Systolic flow reversal in hepatic veins consistent with severe tricuspid regurgitation. The inferior vena cava is dilated in size with less than 50% respiratory variability, suggesting right atrial pressure of 15 mmHg. IAS/Shunts: No atrial level shunt detected by color flow Doppler.  LEFT VENTRICLE PLAX 2D LVIDd:         5.10 cm   Diastology LVIDs:         3.50 cm   LV e' medial:    9.64 cm/s LV PW:         1.10 cm   LV E/e' medial:  15.4 LV IVS:        1.00 cm   LV e' lateral:   11.70 cm/s LVOT diam:     2.65 cm   LV E/e' lateral: 12.6 LV SV:         179 LV SV Index:   92 LVOT Area:     5.52 cm                           3D Volume EF:                          3D EF:        65 %                          LV EDV:       138 ml                          LV ESV:       49 ml                          LV SV:        89 ml RIGHT VENTRICLE             IVC RV S prime:     10.20 cm/s  IVC diam: 2.80 cm TAPSE (M-mode): 1.5 cm LEFT ATRIUM             Index        RIGHT ATRIUM           Index LA diam:        5.10 cm 2.63 cm/m   RA Area:     29.10 cm LA Vol (A2C):   54.8 ml 28.30 ml/m  RA Volume:   117.50 ml 60.68 ml/m LA Vol (A4C):   57.4 ml 29.64 ml/m LA Biplane Vol: 57.2 ml 29.54 ml/m  AORTIC VALVE AV Area (Vmax):    3.10 cm AV Area (Vmean):   2.81 cm AV Area (VTI):     3.32 cm AV Vmax:           240.33 cm/s AV Vmean:          150.000 cm/s AV VTI:             0.538 m AV Peak Grad:      23.1 mmHg AV Mean Grad:      11.3 mmHg LVOT Vmax:         135.00 cm/s LVOT  Vmean:        76.400 cm/s LVOT VTI:          0.324 m LVOT/AV VTI ratio: 0.60  AORTA Ao Root diam: 3.40 cm Ao Asc diam:  3.30 cm MITRAL VALVE                TRICUSPID VALVE MV Area (PHT): 4.24 cm     TR Peak grad:   28.1 mmHg MV Area VTI:   2.75 cm     TR Vmax:        265.00 cm/s MV Peak grad:  10.1 mmHg MV Mean grad:  2.0 mmHg     SHUNTS MV Vmax:       1.59 m/s     Systemic VTI:  0.32 m MV Vmean:      63.9 cm/s    Systemic Diam: 2.65 cm MV Decel Time: 179 msec MV E velocity: 148.00 cm/s MV Rushi Chasen velocity: 105.00 cm/s MV E/Shirl Weir ratio:  1.41 Vishnu Priya Mallipeddi Electronically signed by Lorelee Cover Mallipeddi Signature Date/Time: 08/02/2022/5:10:20 PM    Final         Scheduled Meds:  amLODipine  10 mg Oral QHS   aspirin EC  81 mg Oral QHS   clopidogrel  75 mg Oral Daily   enoxaparin (LOVENOX) injection  30 mg Subcutaneous Q24H   furosemide  40 mg Oral Daily   isosorbide mononitrate  30 mg Oral TID   niacin  500 mg Oral QHS   ranolazine  500 mg Oral Daily   rosuvastatin  40 mg Oral Daily   Continuous Infusions:   LOS: 2 days    Time spent: over 30 min    Fayrene Helper, MD [facility]   To contact the attending provider between 7A-7P or the covering provider during after hours 7P-7A, please log into the web site [email or web address] and access using universal [place] password for that web site. If you do not have the password, please call the hospital operator.  08/03/2022, 6:30 PM

## 2022-08-03 NOTE — Progress Notes (Signed)
Electrophysiology Progress Note  Patient Name: [name] Date of Encounter: 08/03/2022 Primary cardiologist: Dr. Debara Pickett  Subjective   Feels much better today. Not as "wore out."  Inpatient Medications    Scheduled Meds:  amLODipine  10 mg Oral QHS   aspirin EC  81 mg Oral QHS   clopidogrel  75 mg Oral Daily   enoxaparin (LOVENOX) injection  30 mg Subcutaneous Q24H   furosemide  40 mg Oral Daily   isosorbide mononitrate  30 mg Oral TID   niacin  500 mg Oral QHS   ranolazine  500 mg Oral Daily   rosuvastatin  40 mg Oral Daily   Continuous Infusions:  PRN Meds:    Vital Signs    Vitals:   08/02/22 2006 08/02/22 2249 08/03/22 0424 08/03/22 0823  BP: (!) 141/57 (!) 137/58 (!) 139/55 (!) 133/57  Pulse: (!) 36  (!) 37   Resp: '18  17 16  '$ Temp: 97.6 F (36.4 C)  97.6 F (36.4 C) 97.6 F (36.4 C)  TempSrc: Oral  Oral Oral  SpO2: 100%  96%   Weight:   77.1 kg   Height:        Intake/Output Summary (Last 24 hours) at 08/03/2022 0957 Last data filed at 08/02/2022 1400 Gross per 24 hour  Intake 480 ml  Output 150 ml  Net 330 ml   Filed Weights   08/02/22 0615 08/03/22 0424  Weight: 77.9 kg 77.1 kg    Physical Exam    GEN- The patient is well appearing, alert and oriented x 3 today.   Head- normocephalic, atraumatic Lungs- Clear to ausculation bilaterally, normal work of breathing Heart- Regular rate and rhythm, no murmurs, rubs or gallops Extremities- no clubbing, cyanosis, or edema Psych- euthymic mood, full affect   Labs    CBC Recent Labs    08/02/22 0337 08/03/22 0127  WBC 6.4 5.1  NEUTROABS  --  3.1  HGB 9.2* 8.6*  HCT 28.0* 25.0*  MCV 97.2 93.6  PLT 105* 409*   Basic Metabolic Panel Recent Labs    08/02/22 0337 08/03/22 0127  NA 141 138  K 4.2 4.0  CL 112* 111  CO2 17* 19*  GLUCOSE 124* 94  BUN 37* 35*  CREATININE 2.87* 2.82*  CALCIUM 8.8* 8.4*  MG  --  2.2  PHOS  --  3.7     Cardiac studies     ECG: (personally  reviewed) complete heart block with wide, RBBB morphology escape at 39 bpm   Telemetry: (personally reviewed) CHB with stable escape at 39 bpm  TTE: I reviewed the report today. EF is 60-65%. Severe RV dilation. Prosthetic aortic valve is normal in structure and function.  Radiology    US RENAL  Result Date: 08/02/2022 CLINICAL DATA:  Acute renal insufficiency EXAM: RENAL / URINARY TRACT ULTRASOUND COMPLETE COMPARISON:  None Available. FINDINGS: Right Kidney: Renal measurements: 9.6 x 4.6 x 4.5 cm = volume: 106 mL. Echogenicity within normal limits. No mass or hydronephrosis visualized. Left Kidney: Renal measurements: 11.4 x 5.3 x 4 1 cm = volume: 129 mL. Echogenicity within normal limits. No mass or hydronephrosis visualized. Bladder: Appears normal for degree of bladder distention. Other: None. IMPRESSION: No cause for acute renal insufficiency identified. Electronically Signed   By: Dorise Bullion III M.D.   On: 08/02/2022 18:12   ECHOCARDIOGRAM COMPLETE  Result Date: 08/02/2022    ECHOCARDIOGRAM REPORT   Patient Name:   [name] Date of Exam:  08/02/2022 Medical Rec #:  [id number]    Height:       69.0 in Accession #:    [id number]   Weight:       171.7 lb Date of Birth:  05-Mar-1934    BSA:          1.936 m Patient Age:    51 years     BP:           132/56 mmHg Patient Gender: M            HR:           38 bpm. Exam Location:  Inpatient Procedure: 2D Echo, 3D Echo, Cardiac Doppler and Color Doppler Indications:    I50.40* Unspecified combined systolic (congestive) and diastolic                 (congestive) heart failure  History:        Patient has prior history of Echocardiogram examinations, most                 recent 10/31/2020. CHF, CAD, Abnormal ECG and Prior CABG, Stroke,                 Aortic Valve Disease, Arrythmias:Bradycardia and LBBB,                 Signs/Symptoms:Edema, Shortness of Breath and Dyspnea; Risk                 Factors:Hypertension. Aortic stenosis. TAVR.                  Aortic Valve: 26 mm Sapien prosthetic, stented (TAVR) valve is                 present in the aortic position. Procedure Date: 10/31/2020.  Sonographer:    Roseanna Rainbow RDCS Referring Phys: [id number] [place]  1. Left ventricular ejection fraction, by estimation, is 60 to 65%. The left ventricle has normal function. The left ventricle has no regional wall motion abnormalities. Left ventricular diastolic parameters are consistent with Grade II diastolic dysfunction (pseudonormalization).  2. Right ventricular systolic function is mildly reduced. The right ventricular size is moderately enlarged. Pulmonary artery systolic pressures cannot be calculated due to rapid pressure equilibration between right atrium and right ventricle from severe TR.  3. Right atrial size was severely dilated.  4. The mitral valve is grossly normal. Mild mitral valve regurgitation. No evidence of mitral stenosis.  5. The tricuspid valve is abnormal. Tricuspid valve regurgitation is severe.  6. The aortic valve has been repaired/replaced. Aortic valve regurgitation is not visualized. No aortic stenosis is present. There is a 26 mm Sapien prosthetic (TAVR) valve present in the aortic position. Procedure Date: 10/31/2020. Echo findings are consistent with normal structure and function of the aortic valve prosthesis.  7. Systolic flow reversal in hepatic veins consistent with severe tricuspid regurgitation. The inferior vena cava is dilated in size with <50% respiratory variability, suggesting right atrial pressure of 15 mmHg. Comparison(s): Changes from prior study are noted. TR is severe. FINDINGS  Left Ventricle: Left ventricular ejection fraction, by estimation, is 60 to 65%. The left ventricle has normal function. The left ventricle has no regional wall motion abnormalities. The left ventricular internal cavity size was normal in size. There is  no left ventricular hypertrophy. Abnormal (paradoxical) septal motion, consistent  with left bundle branch block. Left ventricular diastolic parameters are consistent with Grade II diastolic dysfunction (pseudonormalization). Right Ventricle:  The right ventricular size is moderately enlarged. No increase in right ventricular wall thickness. Right ventricular systolic function is mildly reduced. The tricuspid regurgitant velocity is 2.65 m/s, and with an assumed right atrial pressure of 8 mmHg, the estimated right ventricular systolic pressure is 81.2 mmHg. Left Atrium: Left atrial size was normal in size. Right Atrium: Right atrial size was severely dilated. Pericardium: There is no evidence of pericardial effusion. Mitral Valve: The mitral valve is grossly normal. Mild mitral valve regurgitation. No evidence of mitral valve stenosis. MV peak gradient, 10.1 mmHg. The mean mitral valve gradient is 2.0 mmHg. Tricuspid Valve: The tricuspid valve is abnormal. Tricuspid valve regurgitation is severe. No evidence of tricuspid stenosis. Aortic Valve: The aortic valve has been repaired/replaced. Aortic valve regurgitation is not visualized. No aortic stenosis is present. Aortic valve mean gradient measures 11.3 mmHg. Aortic valve peak gradient measures 23.1 mmHg. Aortic valve area, by VTI measures 3.32 cm. There is a 26 mm Sapien prosthetic, stented (TAVR) valve present in the aortic position. Procedure Date: 10/31/2020. Echo findings are consistent with normal structure and function of the aortic valve prosthesis. Pulmonic Valve: The pulmonic valve was not well visualized. Pulmonic valve regurgitation is not visualized. No evidence of pulmonic stenosis. Aorta: The aortic root is normal in size and structure. Venous: Systolic flow reversal in hepatic veins consistent with severe tricuspid regurgitation. The inferior vena cava is dilated in size with less than 50% respiratory variability, suggesting right atrial pressure of 15 mmHg. IAS/Shunts: No atrial level shunt detected by color flow Doppler.  LEFT  VENTRICLE PLAX 2D LVIDd:         5.10 cm   Diastology LVIDs:         3.50 cm   LV e' medial:    9.64 cm/s LV PW:         1.10 cm   LV E/e' medial:  15.4 LV IVS:        1.00 cm   LV e' lateral:   11.70 cm/s LVOT diam:     2.65 cm   LV E/e' lateral: 12.6 LV SV:         179 LV SV Index:   92 LVOT Area:     5.52 cm                           3D Volume EF:                          3D EF:        65 %                          LV EDV:       138 ml                          LV ESV:       49 ml                          LV SV:        89 ml RIGHT VENTRICLE             IVC RV S prime:     10.20 cm/s  IVC diam: 2.80 cm TAPSE (M-mode): 1.5 cm LEFT ATRIUM  Index        RIGHT ATRIUM           Index LA diam:        5.10 cm 2.63 cm/m   RA Area:     29.10 cm LA Vol (A2C):   54.8 ml 28.30 ml/m  RA Volume:   117.50 ml 60.68 ml/m LA Vol (A4C):   57.4 ml 29.64 ml/m LA Biplane Vol: 57.2 ml 29.54 ml/m  AORTIC VALVE AV Area (Vmax):    3.10 cm AV Area (Vmean):   2.81 cm AV Area (VTI):     3.32 cm AV Vmax:           240.33 cm/s AV Vmean:          150.000 cm/s AV VTI:            0.538 m AV Peak Grad:      23.1 mmHg AV Mean Grad:      11.3 mmHg LVOT Vmax:         135.00 cm/s LVOT Vmean:        76.400 cm/s LVOT VTI:          0.324 m LVOT/AV VTI ratio: 0.60  AORTA Ao Root diam: 3.40 cm Ao Asc diam:  3.30 cm MITRAL VALVE                TRICUSPID VALVE MV Area (PHT): 4.24 cm     TR Peak grad:   28.1 mmHg MV Area VTI:   2.75 cm     TR Vmax:        265.00 cm/s MV Peak grad:  10.1 mmHg MV Mean grad:  2.0 mmHg     SHUNTS MV Vmax:       1.59 m/s     Systemic VTI:  0.32 m MV Vmean:      63.9 cm/s    Systemic Diam: 2.65 cm MV Decel Time: 179 msec MV E velocity: 148.00 cm/s MV A velocity: 105.00 cm/s MV E/A ratio:  1.41 Vishnu Priya Mallipeddi Electronically signed by Lorelee Cover Mallipeddi Signature Date/Time: 08/02/2022/5:10:20 PM    Final    DG Chest 2 View  Result Date: 08/01/2022 CLINICAL DATA:  Shortness of breath and  dizziness. EXAM: CHEST - 2 VIEW COMPARISON:  Chest x-ray 09/15/2017 FINDINGS: Patient is status post cardiac surgery and TAVR. The heart is mildly enlarged, unchanged. There are minimal atelectatic changes in the lung bases. There is no focal lung consolidation. Small pleural effusions are present. No pneumothorax or acute fracture. IMPRESSION: 1. Mild cardiomegaly. 2. Small pleural effusions. 3. Minimal atelectatic changes in the lung bases. Electronically Signed   By: Ronney Asters M.D.   On: 08/01/2022 17:12     Patient Profile     [name] is a 88 y.o. male with a past medical history significant for chronic bradycardia with LBBB, CAD s/p CABG, s/p TAVR, CKD II, bilateral CEAs.  He was admitted for symptomatic bradycardia due to complete heart block.   He was seen in cardiology clinic 12/5. Betablocker was discontinued at that time.  Assessment & Plan    Complete heart block: no reversible cause. hemodynamically stable. No emergent indication for pacemaker today. Will plan for permanent pacemaker placement prior to DC. Yesterday, I explained the indication for the procedure and the logistics, risks, potential benefit, and after care. Make NPO after midnight for possible pacemaker tomorrow. S/p TAVR: normal structure and function CHFpEF: attributable to bradycardia. Continue furosemide CKD IIIb:  AKI likely but baseline unknown. creatinine trending down. Hopefully, will improve more with improved perfusion.  CAD: minimal elevation of troponin. Low suspicion for ACS. Monitor. No plan for ischemic evaluation this admission due to AKI.       For questions or updates, please contact [facility] Please consult [email or web address] for contact info under Cardiology/STEMI.  Signed, Melida Quitter, MD  08/03/2022, 9:57 AM

## 2022-08-04 ENCOUNTER — Encounter (HOSPITAL_COMMUNITY)
Admission: EM | Disposition: A | Discharge status: Home / Self Care | Payer: Self-pay | Source: Home / Self Care | Attending: Family Medicine

## 2022-08-04 DIAGNOSIS — I442 Atrioventricular block, complete: Secondary | ICD-10-CM | POA: Diagnosis not present

## 2022-08-04 HISTORY — PX: PACEMAKER IMPLANT: EP1218

## 2022-08-04 LAB — BASIC METABOLIC PANEL
Anion gap: 7 (ref 5–15)
BUN: 35 mg/dL — ABNORMAL HIGH (ref 8–23)
CO2: 20 mmol/L — ABNORMAL LOW (ref 22–32)
Calcium: 8.4 mg/dL — ABNORMAL LOW (ref 8.9–10.3)
Chloride: 110 mmol/L (ref 98–111)
Creatinine, Ser: 2.65 mg/dL — ABNORMAL HIGH (ref 0.61–1.24)
GFR, Estimated: 23 mL/min — ABNORMAL LOW (ref >60)
Glucose, Bld: 100 mg/dL — ABNORMAL HIGH (ref 70–99)
Potassium: 3.7 mmol/L (ref 3.5–5.1)
Sodium: 137 mmol/L (ref 135–145)

## 2022-08-04 LAB — CBC
HCT: 25.9 % — ABNORMAL LOW (ref 39.0–52.0)
Hemoglobin: 9 g/dL — ABNORMAL LOW (ref 13.0–17.0)
MCH: 32.4 pg (ref 26.0–34.0)
MCHC: 34.7 g/dL (ref 30.0–36.0)
MCV: 93.2 fL (ref 80.0–100.0)
Platelets: 103 10*3/uL — ABNORMAL LOW (ref 150–400)
RBC: 2.78 MIL/uL — ABNORMAL LOW (ref 4.22–5.81)
RDW: 15.9 % — ABNORMAL HIGH (ref 11.5–15.5)
WBC: 5.1 10*3/uL (ref 4.0–10.5)
nRBC: 0 % (ref 0.0–0.2)

## 2022-08-04 LAB — SURGICAL PCR SCREEN
MRSA, PCR: NEGATIVE
Staphylococcus aureus: POSITIVE — AB

## 2022-08-04 LAB — PHOSPHORUS: Phosphorus: 3.5 mg/dL (ref 2.5–4.6)

## 2022-08-04 LAB — MAGNESIUM: Magnesium: 2.2 mg/dL (ref 1.7–2.4)

## 2022-08-04 SURGERY — PACEMAKER IMPLANT

## 2022-08-04 MED ORDER — CEFAZOLIN SODIUM-DEXTROSE 2-4 GM/100ML-% IV SOLN
INTRAVENOUS | Status: AC
  Filled 2022-08-04: qty 100

## 2022-08-04 MED ORDER — MIDAZOLAM HCL 5 MG/5ML IJ SOLN
INTRAMUSCULAR | Status: DC | PRN
  Administered 2022-08-04: 1 mg via INTRAVENOUS

## 2022-08-04 MED ORDER — SODIUM CHLORIDE 0.9 % IV SOLN: INTRAVENOUS | Status: DC

## 2022-08-04 MED ORDER — LIDOCAINE HCL (PF) 1 % IJ SOLN
INTRAMUSCULAR | Status: AC
  Filled 2022-08-04: qty 60

## 2022-08-04 MED ORDER — CHLORHEXIDINE GLUCONATE CLOTH 2 % EX PADS: 6.0000 | MEDICATED_PAD | Freq: Every day | CUTANEOUS | Status: DC

## 2022-08-04 MED ORDER — CEFAZOLIN SODIUM-DEXTROSE 1-4 GM/50ML-% IV SOLN
1.0000 g | Freq: Four times a day (QID) | INTRAVENOUS | Status: AC
  Administered 2022-08-04 – 2022-08-05 (×3): 1 g via INTRAVENOUS
  Filled 2022-08-04 (×3): qty 50

## 2022-08-04 MED ORDER — SODIUM CHLORIDE 0.9% FLUSH
3.0000 mL | Freq: Two times a day (BID) | INTRAVENOUS | Status: DC
  Administered 2022-08-04 – 2022-08-05 (×2): 3 mL via INTRAVENOUS

## 2022-08-04 MED ORDER — HEPARIN (PORCINE) IN NACL 1000-0.9 UT/500ML-% IV SOLN
INTRAVENOUS | Status: AC
  Filled 2022-08-04: qty 500

## 2022-08-04 MED ORDER — CHLORHEXIDINE GLUCONATE 4 % EX LIQD
60.0000 mL | Freq: Once | CUTANEOUS | Status: AC
  Administered 2022-08-04: 4 via TOPICAL
  Filled 2022-08-04: qty 60

## 2022-08-04 MED ORDER — LIDOCAINE HCL (PF) 1 % IJ SOLN
INTRAMUSCULAR | Status: DC | PRN
  Administered 2022-08-04: 60 mL

## 2022-08-04 MED ORDER — SODIUM CHLORIDE 0.9 % IV SOLN
250.0000 mL | INTRAVENOUS | Status: DC
  Administered 2022-08-04: 250 mL via INTRAVENOUS

## 2022-08-04 MED ORDER — SODIUM CHLORIDE 0.9 % IV SOLN
INTRAVENOUS | Status: AC
  Filled 2022-08-04: qty 2

## 2022-08-04 MED ORDER — CHLORHEXIDINE GLUCONATE 4 % EX LIQD
60.0000 mL | Freq: Once | CUTANEOUS | Status: AC
  Filled 2022-08-04: qty 60

## 2022-08-04 MED ORDER — FENTANYL CITRATE (PF) 100 MCG/2ML IJ SOLN
INTRAMUSCULAR | Status: AC
  Filled 2022-08-04: qty 2

## 2022-08-04 MED ORDER — ONDANSETRON HCL 4 MG/2ML IJ SOLN: 4.0000 mg | Freq: Four times a day (QID) | INTRAMUSCULAR | Status: DC | PRN

## 2022-08-04 MED ORDER — MUPIROCIN 2 % EX OINT
1.0000 | TOPICAL_OINTMENT | Freq: Two times a day (BID) | CUTANEOUS | Status: DC
  Administered 2022-08-05: 1 via NASAL
  Filled 2022-08-04: qty 22

## 2022-08-04 MED ORDER — MIDAZOLAM HCL 5 MG/5ML IJ SOLN
INTRAMUSCULAR | Status: AC
  Filled 2022-08-04: qty 5

## 2022-08-04 MED ORDER — SODIUM CHLORIDE 0.9% FLUSH: 3.0000 mL | INTRAVENOUS | Status: DC | PRN

## 2022-08-04 MED ORDER — SODIUM CHLORIDE 0.9 % IV SOLN
80.0000 mg | INTRAVENOUS | Status: AC
  Administered 2022-08-04: 80 mg
  Filled 2022-08-04: qty 2

## 2022-08-04 MED ORDER — CEFAZOLIN SODIUM-DEXTROSE 2-4 GM/100ML-% IV SOLN
2.0000 g | INTRAVENOUS | Status: AC
  Administered 2022-08-04: 2 g via INTRAVENOUS
  Filled 2022-08-04: qty 100

## 2022-08-04 MED ORDER — ACETAMINOPHEN 325 MG PO TABS
325.0000 mg | ORAL_TABLET | ORAL | Status: DC | PRN
  Administered 2022-08-04: 650 mg via ORAL
  Filled 2022-08-04: qty 2

## 2022-08-04 MED ORDER — FENTANYL CITRATE (PF) 100 MCG/2ML IJ SOLN
INTRAMUSCULAR | Status: DC | PRN
  Administered 2022-08-04: 12.5 ug via INTRAVENOUS

## 2022-08-04 MED ORDER — HEPARIN (PORCINE) IN NACL 1000-0.9 UT/500ML-% IV SOLN
INTRAVENOUS | Status: DC | PRN
  Administered 2022-08-04: 500 mL

## 2022-08-04 SURGICAL SUPPLY — 12 items

## 2022-08-04 NOTE — Progress Notes (Addendum)
Rounding Note    Patient Name: [name] Date of Encounter: 08/04/2022  [facility] Cardiologist: Pixie Casino, MD   Subjective   Sleeping comfortably, easily woken, no complaints, daughter is bedside  Inpatient Medications    Scheduled Meds:  amLODipine  10 mg Oral QHS   aspirin EC  81 mg Oral QHS   furosemide  40 mg Oral Daily   isosorbide mononitrate  30 mg Oral TID   niacin  500 mg Oral QHS   ranolazine  500 mg Oral Daily   rosuvastatin  40 mg Oral Daily   Continuous Infusions:  PRN Meds:    Vital Signs    Vitals:   08/03/22 2004 08/03/22 2128 08/04/22 0049 08/04/22 0255  BP: (!) 123/59 (!) 129/51 (!) 128/55 (!) 149/59  Pulse: (!) 41  (!) 43 (!) 42  Resp: '16  17 20  '$ Temp: 98 F (36.7 C)  98 F (36.7 C) 97.9 F (36.6 C)  TempSrc: Oral  Oral Oral  SpO2: 96%  99% 97%  Weight:    76.5 kg  Height:        Intake/Output Summary (Last 24 hours) at 08/04/2022 0747 Last data filed at 08/03/2022 2123 Gross per 24 hour  Intake 240 ml  Output --  Net 240 ml      08/04/2022    2:55 AM 08/03/2022    4:24 AM 08/02/2022    6:15 AM  Last 3 Weights  Weight (lbs) 168 lb 11.2 oz 169 lb 15.6 oz 171 lb 11.8 oz  Weight (kg) 76.522 kg 77.1 kg 77.9 kg      Telemetry    2:1 and CHB, 30's-40, some 50's - Personally Reviewed  ECG    No new EKGs - Personally Reviewed  Physical Exam   GEN: No acute distress.   Neck: No JVD Cardiac: RRR, bradycardic, 2/6 SM, rubs, or gallops.  Respiratory: Clear to auscultation bilaterally. GI: Soft, nontender, non-distended  MS: No edema; No deformity. Neuro:  Nonfocal  Psych: Normal affect   Labs    High Sensitivity Troponin:   Recent Labs  Lab 08/01/22 1542 08/01/22 1614  TROPONINIHS 56* 58*     Chemistry Recent Labs  Lab 08/02/22 0337 08/03/22 0127 08/04/22 0244  NA 141 138 137  K 4.2 4.0 3.7  CL 112* 111 110  CO2 17* 19* 20*  GLUCOSE 124* 94 100*  BUN 37* 35* 35*  CREATININE 2.87*  2.82* 2.65*  CALCIUM 8.8* 8.4* 8.4*  MG  --  2.2 2.2  PROT  --  5.6*  --   ALBUMIN  --  3.2*  --   AST  --  20  --   ALT  --  25  --   ALKPHOS  --  94  --   BILITOT  --  0.7  --   GFRNONAA 21* 21* 23*  ANIONGAP '12 8 7    '$ Lipids No results for input(s): "CHOL", "TRIG", "HDL", "LABVLDL", "LDLCALC", "CHOLHDL" in the last 168 hours.  Hematology Recent Labs  Lab 08/02/22 0337 08/03/22 0127 08/04/22 0244  WBC 6.4 5.1 5.1  RBC 2.88* 2.67* 2.78*  HGB 9.2* 8.6* 9.0*  HCT 28.0* 25.0* 25.9*  MCV 97.2 93.6 93.2  MCH 31.9 32.2 32.4  MCHC 32.9 34.4 34.7  RDW 16.4* 16.2* 15.9*  PLT 105* 103* 103*   Thyroid No results for input(s): "TSH", "FREET4" in the last 168 hours.  BNP Recent Labs  Lab 08/01/22 1614  BNP 727.4*    DDimer No results for input(s): "DDIMER" in the last 168 hours.   Radiology      Cardiac Studies    08/02/22: TTE 1. Left ventricular ejection fraction, by estimation, is 60 to 65%. The  left ventricle has normal function. The left ventricle has no regional  wall motion abnormalities. Left ventricular diastolic parameters are  consistent with Grade II diastolic  dysfunction (pseudonormalization).   2. Right ventricular systolic function is mildly reduced. The right  ventricular size is moderately enlarged. Pulmonary artery systolic  pressures cannot be calculated due to rapid pressure equilibration between  right atrium and right ventricle from  severe TR.   3. Right atrial size was severely dilated.   4. The mitral valve is grossly normal. Mild mitral valve regurgitation.  No evidence of mitral stenosis.   5. The tricuspid valve is abnormal. Tricuspid valve regurgitation is  severe.   6. The aortic valve has been repaired/replaced. Aortic valve  regurgitation is not visualized. No aortic stenosis is present. There is a  26 mm Sapien prosthetic (TAVR) valve present in the aortic position.  Procedure Date: 10/31/2020. Echo findings are  consistent with  normal structure and function of the aortic valve  prosthesis.   7. Systolic flow reversal in hepatic veins consistent with severe  tricuspid regurgitation. The inferior vena cava is dilated in size with  <50% respiratory variability, suggesting right atrial pressure of 15 mmHg.   Comparison(s): Changes from prior study are noted. TR is severe.   Patient Profile     88 y.o. male w/PMHx of CAD (CABG 1996, PCI's to RCA, last 2013), PVD (b/l CEA 2008), stroke,  HTN, HLDM LBBB, CKD (III, follows with [place] [facility]), VHD (s/p TAVR 2018)   Last saw gen cardiology team 07/22/22, his wife recently passed, noting an up-tick in his fatigue, generalized weakness, EKG noted 2:1 AVBlock with a RBBB with no reports of syncope/overt symptoms, and stable BP, his lopressor stopped  Admitted 08/01/22 with further reduction in exertional capacity, weakness, found in CHB, stable BP, no reversible causes, no suspicion of ACS  Assessment & Plan   CHB LBBB at baseline > RBBB escape in CHB Planned for PPM today Hold his Plavix today Resume post PPM pending pocket stability SCD for DVT prophylaxis   CAD No anginal symptoms No suspicion of ACS with low flat HS Trop in setting of AKI/CKD Resume Plavix post PPM pending pocket stability  PVD Resume Plavix post PPM as discussed above  VHD S/p TAVR 2018 Functioning well  Severe TR on echo this admission RV   AKI on CKD Trending down Uncertain where is baseline is, labs done via [facility] it seems, and not in Epic     For questions or updates, please contact [place] Please consult [email or web address] for contact info under        Signed, Baldwin Jamaica, PA-C  08/04/2022, 7:47 AM    EP Attending  Patient seen and examined. Agree with above. The patient presents with CHB and an escape around 40/min. He is symptomatic and is on no medical therapy. I have discussed the indications/risks/benefits/goals/expectations of PPM  insertion and he wishes to proceed.  Carleene Overlie Krisna Omar,MD

## 2022-08-04 NOTE — Telephone Encounter (Signed)
Per chart review, patient is in the hospital

## 2022-08-04 NOTE — H&P (View-Only) (Signed)
Rounding Note    Patient Name: [name] Date of Encounter: 08/04/2022  [facility] Cardiologist: Pixie Casino, MD   Subjective   Sleeping comfortably, easily woken, no complaints, daughter is bedside  Inpatient Medications    Scheduled Meds:  amLODipine  10 mg Oral QHS   aspirin EC  81 mg Oral QHS   furosemide  40 mg Oral Daily   isosorbide mononitrate  30 mg Oral TID   niacin  500 mg Oral QHS   ranolazine  500 mg Oral Daily   rosuvastatin  40 mg Oral Daily   Continuous Infusions:  PRN Meds:    Vital Signs    Vitals:   08/03/22 2004 08/03/22 2128 08/04/22 0049 08/04/22 0255  BP: (!) 123/59 (!) 129/51 (!) 128/55 (!) 149/59  Pulse: (!) 41  (!) 43 (!) 42  Resp: '16  17 20  '$ Temp: 98 F (36.7 C)  98 F (36.7 C) 97.9 F (36.6 C)  TempSrc: Oral  Oral Oral  SpO2: 96%  99% 97%  Weight:    76.5 kg  Height:        Intake/Output Summary (Last 24 hours) at 08/04/2022 0747 Last data filed at 08/03/2022 2123 Gross per 24 hour  Intake 240 ml  Output --  Net 240 ml      08/04/2022    2:55 AM 08/03/2022    4:24 AM 08/02/2022    6:15 AM  Last 3 Weights  Weight (lbs) 168 lb 11.2 oz 169 lb 15.6 oz 171 lb 11.8 oz  Weight (kg) 76.522 kg 77.1 kg 77.9 kg      Telemetry    2:1 and CHB, 30's-40, some 50's - Personally Reviewed  ECG    No new EKGs - Personally Reviewed  Physical Exam   GEN: No acute distress.   Neck: No JVD Cardiac: RRR, bradycardic, 2/6 SM, rubs, or gallops.  Respiratory: Clear to auscultation bilaterally. GI: Soft, nontender, non-distended  MS: No edema; No deformity. Neuro:  Nonfocal  Psych: Normal affect   Labs    High Sensitivity Troponin:   Recent Labs  Lab 08/01/22 1542 08/01/22 1614  TROPONINIHS 56* 58*     Chemistry Recent Labs  Lab 08/02/22 0337 08/03/22 0127 08/04/22 0244  NA 141 138 137  K 4.2 4.0 3.7  CL 112* 111 110  CO2 17* 19* 20*  GLUCOSE 124* 94 100*  BUN 37* 35* 35*  CREATININE 2.87*  2.82* 2.65*  CALCIUM 8.8* 8.4* 8.4*  MG  --  2.2 2.2  PROT  --  5.6*  --   ALBUMIN  --  3.2*  --   AST  --  20  --   ALT  --  25  --   ALKPHOS  --  94  --   BILITOT  --  0.7  --   GFRNONAA 21* 21* 23*  ANIONGAP '12 8 7    '$ Lipids No results for input(s): "CHOL", "TRIG", "HDL", "LABVLDL", "LDLCALC", "CHOLHDL" in the last 168 hours.  Hematology Recent Labs  Lab 08/02/22 0337 08/03/22 0127 08/04/22 0244  WBC 6.4 5.1 5.1  RBC 2.88* 2.67* 2.78*  HGB 9.2* 8.6* 9.0*  HCT 28.0* 25.0* 25.9*  MCV 97.2 93.6 93.2  MCH 31.9 32.2 32.4  MCHC 32.9 34.4 34.7  RDW 16.4* 16.2* 15.9*  PLT 105* 103* 103*   Thyroid No results for input(s): "TSH", "FREET4" in the last 168 hours.  BNP Recent Labs  Lab 08/01/22 1614  BNP 727.4*    DDimer No results for input(s): "DDIMER" in the last 168 hours.   Radiology      Cardiac Studies    08/02/22: TTE 1. Left ventricular ejection fraction, by estimation, is 60 to 65%. The  left ventricle has normal function. The left ventricle has no regional  wall motion abnormalities. Left ventricular diastolic parameters are  consistent with Grade II diastolic  dysfunction (pseudonormalization).   2. Right ventricular systolic function is mildly reduced. The right  ventricular size is moderately enlarged. Pulmonary artery systolic  pressures cannot be calculated due to rapid pressure equilibration between  right atrium and right ventricle from  severe TR.   3. Right atrial size was severely dilated.   4. The mitral valve is grossly normal. Mild mitral valve regurgitation.  No evidence of mitral stenosis.   5. The tricuspid valve is abnormal. Tricuspid valve regurgitation is  severe.   6. The aortic valve has been repaired/replaced. Aortic valve  regurgitation is not visualized. No aortic stenosis is present. There is a  26 mm Sapien prosthetic (TAVR) valve present in the aortic position.  Procedure Date: 10/31/2020. Echo findings are  consistent with  normal structure and function of the aortic valve  prosthesis.   7. Systolic flow reversal in hepatic veins consistent with severe  tricuspid regurgitation. The inferior vena cava is dilated in size with  <50% respiratory variability, suggesting right atrial pressure of 15 mmHg.   Comparison(s): Changes from prior study are noted. TR is severe.   Patient Profile     88 y.o. male w/PMHx of CAD (CABG 1996, PCI's to RCA, last 2013), PVD (b/l CEA 2008), stroke,  HTN, HLDM LBBB, CKD (III, follows with [place] [facility]), VHD (s/p TAVR 2018)   Last saw gen cardiology team 07/22/22, his wife recently passed, noting an up-tick in his fatigue, generalized weakness, EKG noted 2:1 AVBlock with a RBBB with no reports of syncope/overt symptoms, and stable BP, his lopressor stopped  Admitted 08/01/22 with further reduction in exertional capacity, weakness, found in CHB, stable BP, no reversible causes, no suspicion of ACS  Assessment & Plan   CHB LBBB at baseline > RBBB escape in CHB Planned for PPM today Hold his Plavix today Resume post PPM pending pocket stability SCD for DVT prophylaxis   CAD No anginal symptoms No suspicion of ACS with low flat HS Trop in setting of AKI/CKD Resume Plavix post PPM pending pocket stability  PVD Resume Plavix post PPM as discussed above  VHD S/p TAVR 2018 Functioning well  Severe TR on echo this admission RV   AKI on CKD Trending down Uncertain where is baseline is, labs done via [facility] it seems, and not in Epic     For questions or updates, please contact [place] Please consult [email or web address] for contact info under        Signed, Baldwin Jamaica, PA-C  08/04/2022, 7:47 AM    EP Attending  Patient seen and examined. Agree with above. The patient presents with CHB and an escape around 40/min. He is symptomatic and is on no medical therapy. I have discussed the indications/risks/benefits/goals/expectations of PPM  insertion and he wishes to proceed.  Carleene Overlie Sharnice Bosler,MD

## 2022-08-04 NOTE — Progress Notes (Signed)
Patient to room 6E25 from cath lab. Dressing clean, dry, and intact. Vital signs obtained. Alert and oriented to room and call light. Call bell within reach.   08/04/22 1637  Vitals  Temp 97.7 F (36.5 C)  Temp Source Oral  BP 135/66  MAP (mmHg) 86  BP Location Right Arm  BP Method Automatic  Patient Position (if appropriate) Lying  Pulse Rate 71  Pulse Rate Source Monitor  ECG Heart Rate 71  Resp 18  Level of Consciousness  Level of Consciousness Alert  MEWS COLOR  MEWS Score Color Green  Oxygen Therapy  SpO2 97 %  O2 Device Room Air  Pain Assessment  Pain Scale 0-10  Pain Score 0  MEWS Score  MEWS Temp 0  MEWS Systolic 0  MEWS Pulse 0  MEWS RR 0  MEWS LOC 0  MEWS Score 0

## 2022-08-04 NOTE — Progress Notes (Addendum)
PROGRESS NOTE    [name]  GBT:[id number] DOB: [birth year] DOA: 08/01/2022 PCP: Deland Pretty, MD  Chief Complaint  Patient presents with   Shortness of Breath         Brief Narrative:  CRIMSON BEER is [name] 88 y.o. male with medical history significant of CAD s/p CABG, carotid artery disease, s/p TAVR, CKD stage II, history of CVA, hypertension, chronic bradycardia with LBBB, PAD, pulmonary hypertension, squamous cell carcinoma who presents with symptomatic bradycardia.   Patient reportedly at baseline has chronic bradycardia with heart rate in the 50s.  Per cardiology outpatient documentation, he has had increasing shortness of breath and fatigue with exertion since 09/2020 and at that time had decrease of his Lopressor to 12.5 mg twice daily.  He gradually had improvement in his symptoms and had follow-up echo on 10/31/2020 with normal LVEF, mildly dilated left atrial with severely dilated right atrial, trivial mitral valve regurgitation, mild-moderate tricuspid valve regurgitation and TAVR in correct position and functioning normally. He presented for follow-up at cardiology on 07/22/2022 reports increasing fatigue and weakness following the death of his wife Quill Grinder week prior.  EKG obtained revealed [name] heart rate of 38in sinus rhythm with 2:1 AV block.  Metoprolol was discontinued at that time. Echo was ordered but pt called cardiology office today due to worsening exertional dyspnea with chest tightness over short distances and was advised to present to ED.    In the ED, HR in the 30s with stable BP. No significant electrolyte abnormality other than creatinine of 3 with unknown baseline. EKG concerning for complete heart block. He was evaluated by cardiology who recommend likely pacemaker placement this admission.    Assessment & Plan:   Principal Problem:   Complete heart block (HCC) Active Problems:   Essential hypertension   CKD (chronic kidney disease) stage 3, GFR 30-59 ml/min (HCC)    CAD S/P percutaneous coronary angioplasty   History of CVA (cerebrovascular accident)   Acute on chronic diastolic heart failure (HCC)   Hx of CABG  Complete heart block ([place]) -presented with symptomatic bradycardia. Remains hemodynamically stable.  -s/p pacemaker placement 12/18 -no emergent indication for pacemaker per cardiology -needs pacemaker placement prior to d/c - per cards, NPO at [place]n tonight   Essential hypertension -continue home antihypertensives   AKI on CKD (chronic kidney disease) stage 3, GFR 30-59 ml/min (HCC) - unclear recent baseline - creatinine 1.7 in 2020 - 3 at presentation, will follow - renal US without acute abnormality - deterioration in UOP would ben indication for temporary wire over wknd   Acute on chronic diastolic heart failure ([place]) -will obtain echocardiogram per cardiology -> echo with EF 60-65%, grade II diastolic dysfunction, severe TR  -PO lasix -appreciate cardiology assistance   History of CVA (cerebrovascular accident) -continue on aspirin and plavix   CAD S/P percutaneous coronary angioplasty -s/p CABG -no acute chest pain. Continue DAPT.     DVT prophylaxis: lovenox  Code Status: full Family Communication: none Disposition:   Status is: Inpatient Remains inpatient appropriate because: pending pacemaker placement   Consultants:  cardiology  Procedures:  Echo pending  Antimicrobials:  Anti-infectives (From admission, onward)    Start     Dose/Rate Route Frequency Ordered Stop   08/04/22 2000  ceFAZolin (ANCEF) IVPB 1 g/50 mL premix        1 g 100 mL/hr over 30 Minutes Intravenous Every 6 hours 08/04/22 1636 08/05/22 1159   08/04/22 1100  gentamicin (GARAMYCIN) 80 mg in  sodium chloride 0.9 % 500 mL irrigation        80 mg Irrigation On call 08/04/22 1000 08/04/22 1603   08/04/22 1100  ceFAZolin (ANCEF) IVPB 2g/100 mL premix        2 g 200 mL/hr over 30 Minutes Intravenous On call 08/04/22 1000 08/04/22 1602        Subjective: No complaints  Objective: Vitals:   08/04/22 0255 08/04/22 0815 08/04/22 1249 08/04/22 1637  BP: (!) 149/59 (!) 133/53 (!) 134/55 135/66  Pulse: (!) 42 (!) 40 (!) 39 71  Resp: '20 18 17 18  '$ Temp: 97.9 F (36.6 C) 97.9 F (36.6 C) 97.7 F (36.5 C) 97.7 F (36.5 C)  TempSrc: Oral Oral Oral Oral  SpO2: 97% 97% 98% 97%  Weight: 76.5 kg     Height:        Intake/Output Summary (Last 24 hours) at 08/04/2022 1941 Last data filed at 08/04/2022 1800 Gross per 24 hour  Intake 493.69 ml  Output --  Net 493.69 ml    Filed Weights   08/02/22 0615 08/03/22 0424 08/04/22 0255  Weight: 77.9 kg 77.1 kg 76.5 kg    Examination:  General: No acute distress. Cardiovascular: RRR Lungs: unlabored Abdomen: Soft, nontender, nondistended Neurological: Alert and oriented 3. Moves all extremities 4. Cranial nerves II through XII grossly intact. Extremities: No clubbing or cyanosis. No edema.  Data Reviewed: I have personally reviewed following labs and imaging studies  CBC: Recent Labs  Lab 08/01/22 1614 08/02/22 0337 08/03/22 0127 08/04/22 0244  WBC 5.0 6.4 5.1 5.1  NEUTROABS  --   --  3.1  --   HGB 9.0* 9.2* 8.6* 9.0*  HCT 27.2* 28.0* 25.0* 25.9*  MCV 96.1 97.2 93.6 93.2  PLT 101* 105* 103* 103*    Basic Metabolic Panel: Recent Labs  Lab 08/01/22 1614 08/02/22 0337 08/03/22 0127 08/04/22 0244  NA 141 141 138 137  K 4.3 4.2 4.0 3.7  CL 112* 112* 111 110  CO2 19* 17* 19* 20*  GLUCOSE 142* 124* 94 100*  BUN 40* 37* 35* 35*  CREATININE 3.03* 2.87* 2.82* 2.65*  CALCIUM 8.6* 8.8* 8.4* 8.4*  MG  --   --  2.2 2.2  PHOS  --   --  3.7 3.5    GFR: Estimated Creatinine Clearance: 20 mL/min ([name]) (by C-G formula based on SCr of 2.65 mg/dL (H)).  Liver Function Tests: Recent Labs  Lab 08/03/22 0127  AST 20  ALT 25  ALKPHOS 94  BILITOT 0.7  PROT 5.6*  ALBUMIN 3.2*    CBG: No results for input(s): "GLUCAP" in the last 168 hours.   Recent Results  (from the past 240 hour(s))  Surgical PCR screen     Status: Abnormal   Collection Time: 08/04/22 10:01 AM   Specimen: Nasal Mucosa; Nasal Swab  Result Value Ref Range Status   MRSA, PCR NEGATIVE NEGATIVE Final   Staphylococcus aureus POSITIVE ([name]) NEGATIVE Final    Comment: (NOTE) The Xpert SA Assay (FDA approved for NASAL specimens in patients 67 years of age and older), is one component of [facility] comprehensive surveillance program. It is not intended to diagnose infection nor to guide or monitor treatment. Performed at [facility], [place] [address], [place],  [address]          Radiology Studies: EP PPM/ICD IMPLANT  Result Date: 08/04/2022 CONCLUSIONS:  1. Successful implantation of [name] Medtronic dual-chamber pacemaker for symptomatic bradycardia due to complete heart  block  2. No early apparent complications.       Steven Peru, MD 08/04/2022 5:54 PM       Scheduled Meds:  amLODipine  10 mg Oral QHS   aspirin EC  81 mg Oral QHS   [START ON 08/05/2022] Chlorhexidine Gluconate Cloth  6 each Topical Q0600   furosemide  40 mg Oral Daily   isosorbide mononitrate  30 mg Oral TID   mupirocin ointment  1 Application Nasal BID   niacin  500 mg Oral QHS   ranolazine  500 mg Oral Daily   rosuvastatin  40 mg Oral Daily   sodium chloride flush  3 mL Intravenous Q12H   Continuous Infusions:   ceFAZolin (ANCEF) IV       LOS: 3 days    Time spent: over 30 min    Steven Helper, MD [facility]   To contact the attending provider between 7A-7P or the covering provider during after hours 7P-7A, please log into the web site [email or web address] and access using universal [place] password for that web site. If you do not have the password, please call the hospital operator.  08/04/2022, 7:41 PM

## 2022-08-04 NOTE — Interval H&P Note (Signed)
History and Physical Interval Note:  08/04/2022 1:39 PM  [name]  has presented today for surgery, with the diagnosis of heart block.  The various methods of treatment have been discussed with the patient and family. After consideration of risks, benefits and other options for treatment, the patient has consented to  Procedure(s): PACEMAKER IMPLANT (N/A) as a surgical intervention.  The patient's history has been reviewed, patient examined, no change in status, stable for surgery.  I have reviewed the patient's chart and labs.  Questions were answered to the patient's satisfaction.     Cristopher Peru

## 2022-08-04 NOTE — Telephone Encounter (Signed)
Patient currently admitted

## 2022-08-04 NOTE — Care Management Important Message (Signed)
Important Message  Patient Details  Name: [name] MRN: [id number] Date of Birth: February 14, 1934   Medicare Important Message Given:  Yes     [name] 08/04/2022, 12:47 PM

## 2022-08-05 ENCOUNTER — Encounter (HOSPITAL_COMMUNITY): Payer: Self-pay | Admitting: Internal Medicine

## 2022-08-05 ENCOUNTER — Other Ambulatory Visit (HOSPITAL_COMMUNITY): Payer: Self-pay

## 2022-08-05 ENCOUNTER — Inpatient Hospital Stay (HOSPITAL_COMMUNITY): Payer: Medicare PPO

## 2022-08-05 DIAGNOSIS — I442 Atrioventricular block, complete: Secondary | ICD-10-CM | POA: Diagnosis not present

## 2022-08-05 LAB — CBC
HCT: 27.1 % — ABNORMAL LOW (ref 39.0–52.0)
Hemoglobin: 9.4 g/dL — ABNORMAL LOW (ref 13.0–17.0)
MCH: 32.4 pg (ref 26.0–34.0)
MCHC: 34.7 g/dL (ref 30.0–36.0)
MCV: 93.4 fL (ref 80.0–100.0)
Platelets: 107 10*3/uL — ABNORMAL LOW (ref 150–400)
RBC: 2.9 MIL/uL — ABNORMAL LOW (ref 4.22–5.81)
RDW: 15.7 % — ABNORMAL HIGH (ref 11.5–15.5)
WBC: 5.7 10*3/uL (ref 4.0–10.5)
nRBC: 0 % (ref 0.0–0.2)

## 2022-08-05 LAB — BASIC METABOLIC PANEL
Anion gap: 9 (ref 5–15)
BUN: 29 mg/dL — ABNORMAL HIGH (ref 8–23)
CO2: 19 mmol/L — ABNORMAL LOW (ref 22–32)
Calcium: 8.9 mg/dL (ref 8.9–10.3)
Chloride: 110 mmol/L (ref 98–111)
Creatinine, Ser: 2.68 mg/dL — ABNORMAL HIGH (ref 0.61–1.24)
GFR, Estimated: 22 mL/min — ABNORMAL LOW (ref >60)
Glucose, Bld: 99 mg/dL (ref 70–99)
Potassium: 4 mmol/L (ref 3.5–5.1)
Sodium: 138 mmol/L (ref 135–145)

## 2022-08-05 MED ORDER — CLOPIDOGREL BISULFATE 75 MG PO TABS: 75.0000 mg | ORAL_TABLET | Freq: Every day | ORAL | 3 refills | Status: AC

## 2022-08-05 MED ORDER — METOPROLOL TARTRATE 25 MG PO TABS
25.0000 mg | ORAL_TABLET | Freq: Two times a day (BID) | ORAL | Status: DC
  Administered 2022-08-05: 25 mg via ORAL
  Filled 2022-08-05: qty 1

## 2022-08-05 MED ORDER — METOPROLOL TARTRATE 25 MG PO TABS
25.0000 mg | ORAL_TABLET | Freq: Two times a day (BID) | ORAL | 1 refills | Status: AC
  Filled 2022-08-05: qty 60, 30d supply, fill #0

## 2022-08-05 NOTE — Discharge Instructions (Addendum)
LEAVE THE LARGE DRESSING IN PLACE UNTIL SEEN IN THE OFFICE AND REMOVED  DO NOT GET WET Then follow instructions below       Supplemental Discharge Instructions for  Pacemaker/Defibrillator Patients   Activity No heavy lifting or vigorous activity with your left/right arm for 6 to 8 weeks.  Do not raise your left/right arm above your head for one week.  Gradually raise your affected arm as drawn below.             08/09/22                   08/10/22                    08/11/22                  08/12/22 __  NO DRIVING for  1 week   ; you may begin driving on   03/54/65  .  WOUND CARE Keep the wound area clean and dry.  Do not get this area wet , no showers for one week; you may shower on  08/12/22  . The tape/steri-strips on your wound will fall off; do not pull them off.  No bandage is needed on the site.  DO  NOT apply any creams, oils, or ointments to the wound area. If you notice any drainage or discharge from the wound, any swelling or bruising at the site, or you develop a fever > 101? F after you are discharged home, call the office at once.  Special Instructions You are still able to use cellular telephones; use the ear opposite the side where you have your pacemaker/defibrillator.  Avoid carrying your cellular phone near your device. When traveling through airports, show security personnel your identification card to avoid being screened in the metal detectors.  Ask the security personnel to use the hand wand. Avoid arc welding equipment, MRI testing (magnetic resonance imaging), TENS units (transcutaneous nerve stimulators).  Call the office for questions about other devices. Avoid electrical appliances that are in poor condition or are not properly grounded. Microwave ovens are safe to be near or to operate.

## 2022-08-05 NOTE — Discharge Summary (Signed)
Physician Discharge Summary  [name] QQI:[id number] DOB: 1934-03-15 DOA: 08/01/2022  PCP: Steven Pretty, MD  Admit date: 08/01/2022 Discharge date: 08/05/2022  Time spent: 40 minutes  Recommendations for Outpatient Follow-up:  Follow outpatient CBC/CMP  Follow with cardiology outpatient Follow with renal outpatient   Discharge Diagnoses:  Principal Problem:   Complete heart block ([name]) Active Problems:   Essential hypertension   CKD (chronic kidney disease) stage 3, GFR 30-59 ml/min (HCC)   CAD S/P percutaneous coronary angioplasty   History of CVA (cerebrovascular accident)   Acute on chronic diastolic heart failure (HCC)   Hx of CABG   Discharge Condition: stable  Diet recommendation: heart healthy  Filed Weights   08/03/22 0424 08/04/22 0255 08/05/22 0440  Weight: 77.1 kg 76.5 kg 73.9 kg    History of present illness:  [name] is [name] 88 y.o. male with medical history significant of CAD s/p CABG, carotid artery disease, s/p TAVR, CKD stage II, history of CVA, hypertension, chronic bradycardia with LBBB, PAD, pulmonary hypertension, squamous cell carcinoma who presents with symptomatic bradycardia.  Now s/p pacemaker placement.  Stable for discharge on 12/19.  See below for additional details.  Hospital Course:  Assessment and Plan: Complete heart block ([name]) -presented with symptomatic bradycardia. s/p pacemaker placement 12/18. -post op care per cards -plavix on hold till 12/25   Essential hypertension -continue home antihypertensives   AKI on CKD (chronic kidney disease) stage 3, GFR 30-59 ml/min (HCC) - unclear recent baseline - creatinine 1.7 in 2020 - 3 at presentation, improved at this time - per my discussion with his primary nephrologist, recent baseline was around 3, currently better than his recent baseline - follow with renal outpatient - renal US without acute findings   Acute on chronic diastolic heart failure ([name]) -will obtain  echocardiogram per cardiology -> echo with EF 60-65%, grade II diastolic dysfunction, severe TR  -PO lasix -appreciate cardiology assistance   History of CVA (cerebrovascular accident) -continue on aspirin and plavix (on hold till 12/25)   CAD S/P percutaneous coronary angioplasty -s/p CABG -no acute chest pain. Continue DAPT (plavix on hold till 12/25)     Procedures: PROCEDURES:  1. Pacemaker implantation. 12/18   Echo IMPRESSIONS     1. Left ventricular ejection fraction, by estimation, is 60 to 65%. The  left ventricle has normal function. The left ventricle has no regional  wall motion abnormalities. Left ventricular diastolic parameters are  consistent with Grade II diastolic  dysfunction (pseudonormalization).   2. Right ventricular systolic function is mildly reduced. The right  ventricular size is moderately enlarged. Pulmonary artery systolic  pressures cannot be calculated due to rapid pressure equilibration between  right atrium and right ventricle from  severe TR.   3. Right atrial size was severely dilated.   4. The mitral valve is grossly normal. Mild mitral valve regurgitation.  No evidence of mitral stenosis.   5. The tricuspid valve is abnormal. Tricuspid valve regurgitation is  severe.   6. The aortic valve has been repaired/replaced. Aortic valve  regurgitation is not visualized. No aortic stenosis is present. There is [name]  26 mm Sapien prosthetic (TAVR) valve present in the aortic position.  Procedure Date: 10/31/2020. Echo findings are  consistent with normal structure and function of the aortic valve  prosthesis.   7. Systolic flow reversal in hepatic veins consistent with severe  tricuspid regurgitation. The inferior vena cava is dilated in size with  <50% respiratory variability, suggesting right atrial  pressure of 15 mmHg.   Comparison(s): Changes from prior study are noted. TR is severe.    Consultations: cardiology  Discharge Exam: Vitals:    08/05/22 0440 08/05/22 0745  BP: (!) 142/73 138/73  Pulse: 77 81  Resp: 16 17  Temp: 97.9 F (36.6 C) 97.6 F (36.4 C)  SpO2: 97% 98%   No complaints  General: No acute distress. Cardiovascular: RRR, dressing to L chest Lungs: unlabored Abdomen: Soft, nontender, nondistended Neurological: Alert and oriented 3. Moves all extremities 4 with equal strength. Cranial nerves II through XII grossly intact. Extremities: No clubbing or cyanosis. No edema.  Discharge Instructions   Discharge Instructions     Call MD for:  difficulty breathing, headache or visual disturbances   Complete by: As directed    Call MD for:  extreme fatigue   Complete by: As directed    Call MD for:  hives   Complete by: As directed    Call MD for:  persistant dizziness or light-headedness   Complete by: As directed    Call MD for:  persistant nausea and vomiting   Complete by: As directed    Call MD for:  redness, tenderness, or signs of infection (pain, swelling, redness, odor or green/yellow discharge around incision site)   Complete by: As directed    Call MD for:  severe uncontrolled pain   Complete by: As directed    Call MD for:  temperature >100.4   Complete by: As directed    Diet - low sodium heart healthy   Complete by: As directed    Discharge instructions   Complete by: As directed    You were seen with heart block.  You've now had Keyoni Lapinski pacemaker placed.  Please follow up with cardiology outpatient as recommended.  Hold your plavix until 12/25.  We've started you on metoprolol.   You should follow up with [facility] for your chronic kidney disease.  Return for new, recurrent, or worsening symptoms.  Please ask your PCP to request records from this hospitalization so they know what was done and what the next steps will be.   Increase activity slowly   Complete by: As directed       Allergies as of 08/05/2022       Reactions   Atorvastatin Other (See Comments)    Muscle and joint pain, can tolerate rosuvastatin        Medication List     TAKE these medications    amLODipine 10 MG tablet Commonly known as: NORVASC Take 1 tablet (10 mg total) by mouth at bedtime.   aspirin EC 81 MG tablet Take 81 mg by mouth at bedtime.   clopidogrel 75 MG tablet Commonly known as: PLAVIX Take 1 tablet (75 mg total) by mouth daily. Start taking on: August 11, 2022 What changed: These instructions start on August 11, 2022. If you are unsure what to do until then, ask your doctor or other care provider.   furosemide 40 MG tablet Commonly known as: LASIX Take 1 tablet (40 mg total) by mouth daily.   isosorbide mononitrate 30 MG 24 hr tablet Commonly known as: IMDUR Take 30 mg by mouth 3 (three) times daily.   metoprolol tartrate 25 MG tablet Commonly known as: LOPRESSOR Take 1 tablet (25 mg total) by mouth 2 (two) times daily.   niacin 500 MG ER tablet Commonly known as: VITAMIN B3 Take 500 mg by mouth at bedtime.   nitroGLYCERIN 0.4 MG SL  tablet Commonly known as: Nitrostat Place 1 tablet (0.4 mg total) under the tongue every 5 (five) minutes x 3 doses as needed.   polyethylene glycol powder 17 GM/SCOOP powder Commonly known as: GLYCOLAX/MIRALAX Take 0.5 Containers by mouth daily.   ranolazine 500 MG 12 hr tablet Commonly known as: RANEXA TAKE 1 TABLET BY MOUTH TWICE Marka Treloar DAY   rosuvastatin 40 MG tablet Commonly known as: CRESTOR TAKE 1 TABLET (40 MG TOTAL) BY MOUTH DAILY. SCHEDULE AN APPOINTMENT FOR FURTHER REFILLS, 1ST ATTEMPT   tacrolimus 0.1 % ointment Commonly known as: PROTOPIC APPLY 1 APPLICATION TOPICALLY IN THE MORNING AND AT BEDTIME DAILY       Allergies  Allergen Reactions   Atorvastatin Other (See Comments)    Muscle and joint pain, can tolerate rosuvastatin    Follow-up Information     Shirley Friar, PA-C Follow up.   Specialty: Cardiology Why: 08/06/22 @ 11:40AM, to remove the  dressing/bandages Contact information: [address] [place] [address] [phone]                  The results of significant diagnostics from this hospitalization (including imaging, microbiology, ancillary and laboratory) are listed below for reference.    Significant Diagnostic Studies: DG Chest 2 View  Result Date: 08/05/2022 CLINICAL DATA:  Provided history: Pacemaker. EXAM: CHEST - 2 VIEW COMPARISON:  Prior chest radiographs 08/01/2022 and earlier. FINDINGS: Prior median sternotomy/CABG. Prior TAVR. New from the prior exam, there is Rethel Sebek left chest multi-lead implantable cardiac device with leads terminating in the region of the right atrium and right ventricle. The cardiomediastinal silhouette is unchanged. Aortic atherosclerosis. Small bilateral pleural effusions. Minimal atelectasis or scarring within the left lung base. No appreciable airspace consolidation. No evidence of pneumothorax. Degenerative changes of the spine. IMPRESSION: Left chest multi-lead implantable cardiac device, new from the prior examination of 08/01/2022. Leads terminate in the region of the right atrium and right ventricle. Persistent small bilateral pleural effusions. Minimal atelectasis or scarring within the left lung base. Aortic Atherosclerosis (ICD10-I70.0). Electronically Signed   By: Kellie Simmering D.O.   On: 08/05/2022 08:45   EP PPM/ICD IMPLANT  Result Date: 08/04/2022 CONCLUSIONS:  1. Successful implantation of Josselin Gaulin Medtronic dual-chamber pacemaker for symptomatic bradycardia due to complete heart block  2. No early apparent complications.       Cristopher Peru, MD 08/04/2022 5:54 PM  US RENAL  Result Date: 08/02/2022 CLINICAL DATA:  Acute renal insufficiency EXAM: RENAL / URINARY TRACT ULTRASOUND COMPLETE COMPARISON:  None Available. FINDINGS: Right Kidney: Renal measurements: 9.6 x 4.6 x 4.5 cm = volume: 106 mL. Echogenicity within normal limits. No mass or hydronephrosis visualized.  Left Kidney: Renal measurements: 11.4 x 5.3 x 4 1 cm = volume: 129 mL. Echogenicity within normal limits. No mass or hydronephrosis visualized. Bladder: Appears normal for degree of bladder distention. Other: None. IMPRESSION: No cause for acute renal insufficiency identified. Electronically Signed   By: Dorise Bullion III M.D.   On: 08/02/2022 18:12   ECHOCARDIOGRAM COMPLETE  Result Date: 08/02/2022    ECHOCARDIOGRAM REPORT   Patient Name:   [name] Date of Exam: 08/02/2022 Medical Rec #:  [id number]    Height:       69.0 in Accession #:    [id number]   Weight:       171.7 lb Date of Birth:  1933/11/20    BSA:          1.936 m Patient Age:  88 years     BP:           132/56 mmHg Patient Gender: M            HR:           38 bpm. Exam Location:  Inpatient Procedure: 2D Echo, 3D Echo, Cardiac Doppler and Color Doppler Indications:    I50.40* Unspecified combined systolic (congestive) and diastolic                 (congestive) heart failure  History:        Patient has prior history of Echocardiogram examinations, most                 recent 10/31/2020. CHF, CAD, Abnormal ECG and Prior CABG, Stroke,                 Aortic Valve Disease, Arrythmias:Bradycardia and LBBB,                 Signs/Symptoms:Edema, Shortness of Breath and Dyspnea; Risk                 Factors:Hypertension. Aortic stenosis. TAVR.                 Aortic Valve: 26 mm Sapien prosthetic, stented (TAVR) valve is                 present in the aortic position. Procedure Date: 10/31/2020.  Sonographer:    Roseanna Rainbow RDCS Referring Phys: [id number] [place]  1. Left ventricular ejection fraction, by estimation, is 60 to 65%. The left ventricle has normal function. The left ventricle has no regional wall motion abnormalities. Left ventricular diastolic parameters are consistent with Grade II diastolic dysfunction (pseudonormalization).  2. Right ventricular systolic function is mildly reduced. The right ventricular size is moderately  enlarged. Pulmonary artery systolic pressures cannot be calculated due to rapid pressure equilibration between right atrium and right ventricle from severe TR.  3. Right atrial size was severely dilated.  4. The mitral valve is grossly normal. Mild mitral valve regurgitation. No evidence of mitral stenosis.  5. The tricuspid valve is abnormal. Tricuspid valve regurgitation is severe.  6. The aortic valve has been repaired/replaced. Aortic valve regurgitation is not visualized. No aortic stenosis is present. There is Vela Render 26 mm Sapien prosthetic (TAVR) valve present in the aortic position. Procedure Date: 10/31/2020. Echo findings are consistent with normal structure and function of the aortic valve prosthesis.  7. Systolic flow reversal in hepatic veins consistent with severe tricuspid regurgitation. The inferior vena cava is dilated in size with <50% respiratory variability, suggesting right atrial pressure of 15 mmHg. Comparison(s): Changes from prior study are noted. TR is severe. FINDINGS  Left Ventricle: Left ventricular ejection fraction, by estimation, is 60 to 65%. The left ventricle has normal function. The left ventricle has no regional wall motion abnormalities. The left ventricular internal cavity size was normal in size. There is  no left ventricular hypertrophy. Abnormal (paradoxical) septal motion, consistent with left bundle branch block. Left ventricular diastolic parameters are consistent with Grade II diastolic dysfunction (pseudonormalization). Right Ventricle: The right ventricular size is moderately enlarged. No increase in right ventricular wall thickness. Right ventricular systolic function is mildly reduced. The tricuspid regurgitant velocity is 2.65 m/s, and with an assumed right atrial pressure of 8 mmHg, the estimated right ventricular systolic pressure is 76.7 mmHg. Left Atrium: Left atrial size was normal in size. Right Atrium: Right atrial size  was severely dilated. Pericardium: There is  no evidence of pericardial effusion. Mitral Valve: The mitral valve is grossly normal. Mild mitral valve regurgitation. No evidence of mitral valve stenosis. MV peak gradient, 10.1 mmHg. The mean mitral valve gradient is 2.0 mmHg. Tricuspid Valve: The tricuspid valve is abnormal. Tricuspid valve regurgitation is severe. No evidence of tricuspid stenosis. Aortic Valve: The aortic valve has been repaired/replaced. Aortic valve regurgitation is not visualized. No aortic stenosis is present. Aortic valve mean gradient measures 11.3 mmHg. Aortic valve peak gradient measures 23.1 mmHg. Aortic valve area, by VTI measures 3.32 cm. There is Emon Lance 26 mm Sapien prosthetic, stented (TAVR) valve present in the aortic position. Procedure Date: 10/31/2020. Echo findings are consistent with normal structure and function of the aortic valve prosthesis. Pulmonic Valve: The pulmonic valve was not well visualized. Pulmonic valve regurgitation is not visualized. No evidence of pulmonic stenosis. Aorta: The aortic root is normal in size and structure. Venous: Systolic flow reversal in hepatic veins consistent with severe tricuspid regurgitation. The inferior vena cava is dilated in size with less than 50% respiratory variability, suggesting right atrial pressure of 15 mmHg. IAS/Shunts: No atrial level shunt detected by color flow Doppler.  LEFT VENTRICLE PLAX 2D LVIDd:         5.10 cm   Diastology LVIDs:         3.50 cm   LV e' medial:    9.64 cm/s LV PW:         1.10 cm   LV E/e' medial:  15.4 LV IVS:        1.00 cm   LV e' lateral:   11.70 cm/s LVOT diam:     2.65 cm   LV E/e' lateral: 12.6 LV SV:         179 LV SV Index:   92 LVOT Area:     5.52 cm                           3D Volume EF:                          3D EF:        65 %                          LV EDV:       138 ml                          LV ESV:       49 ml                          LV SV:        89 ml RIGHT VENTRICLE             IVC RV S prime:     10.20 cm/s  IVC diam: 2.80  cm TAPSE (M-mode): 1.5 cm LEFT ATRIUM             Index        RIGHT ATRIUM           Index LA diam:        5.10 cm 2.63 cm/m   RA Area:     29.10 cm LA Vol (A2C):   54.8 ml 28.30 ml/m  RA Volume:  117.50 ml 60.68 ml/m LA Vol (A4C):   57.4 ml 29.64 ml/m LA Biplane Vol: 57.2 ml 29.54 ml/m  AORTIC VALVE AV Area (Vmax):    3.10 cm AV Area (Vmean):   2.81 cm AV Area (VTI):     3.32 cm AV Vmax:           240.33 cm/s AV Vmean:          150.000 cm/s AV VTI:            0.538 m AV Peak Grad:      23.1 mmHg AV Mean Grad:      11.3 mmHg LVOT Vmax:         135.00 cm/s LVOT Vmean:        76.400 cm/s LVOT VTI:          0.324 m LVOT/AV VTI ratio: 0.60  AORTA Ao Root diam: 3.40 cm Ao Asc diam:  3.30 cm MITRAL VALVE                TRICUSPID VALVE MV Area (PHT): 4.24 cm     TR Peak grad:   28.1 mmHg MV Area VTI:   2.75 cm     TR Vmax:        265.00 cm/s MV Peak grad:  10.1 mmHg MV Mean grad:  2.0 mmHg     SHUNTS MV Vmax:       1.59 m/s     Systemic VTI:  0.32 m MV Vmean:      63.9 cm/s    Systemic Diam: 2.65 cm MV Decel Time: 179 msec MV E velocity: 148.00 cm/s MV Kelli Robeck velocity: 105.00 cm/s MV E/Banks Chaikin ratio:  1.41 Vishnu Priya Mallipeddi Electronically signed by Lorelee Cover Mallipeddi Signature Date/Time: 08/02/2022/5:10:20 PM    Final    DG Chest 2 View  Result Date: 08/01/2022 CLINICAL DATA:  Shortness of breath and dizziness. EXAM: CHEST - 2 VIEW COMPARISON:  Chest x-ray 09/15/2017 FINDINGS: Patient is status post cardiac surgery and TAVR. The heart is mildly enlarged, unchanged. There are minimal atelectatic changes in the lung bases. There is no focal lung consolidation. Small pleural effusions are present. No pneumothorax or acute fracture. IMPRESSION: 1. Mild cardiomegaly. 2. Small pleural effusions. 3. Minimal atelectatic changes in the lung bases. Electronically Signed   By: Ronney Asters M.D.   On: 08/01/2022 17:12    Microbiology: Recent Results (from the past 240 hour(s))  Surgical PCR screen     Status:  Abnormal   Collection Time: 08/04/22 10:01 AM   Specimen: Nasal Mucosa; Nasal Swab  Result Value Ref Range Status   MRSA, PCR NEGATIVE NEGATIVE Final   Staphylococcus aureus POSITIVE (Devyon Keator) NEGATIVE Final    Comment: (NOTE) The Xpert SA Assay (FDA approved for NASAL specimens in patients 24 years of age and older), is one component of Nikie Cid comprehensive surveillance program. It is not intended to diagnose infection nor to guide or monitor treatment. Performed at [facility], [place] [address], [place], [place] [address]      Labs: Basic Metabolic Panel: Recent Labs  Lab 08/01/22 1614 08/02/22 0337 08/03/22 0127 08/04/22 0244 08/05/22 0207  NA 141 141 138 137 138  K 4.3 4.2 4.0 3.7 4.0  CL 112* 112* 111 110 110  CO2 19* 17* 19* 20* 19*  GLUCOSE 142* 124* 94 100* 99  BUN 40* 37* 35* 35* 29*  CREATININE 3.03* 2.87* 2.82* 2.65* 2.68*  CALCIUM 8.6* 8.8* 8.4* 8.4* 8.9  MG  --   --  2.2 2.2  --   PHOS  --   --  3.7 3.5  --    Liver Function Tests: Recent Labs  Lab 08/03/22 0127  AST 20  ALT 25  ALKPHOS 94  BILITOT 0.7  PROT 5.6*  ALBUMIN 3.2*   No results for input(s): "LIPASE", "AMYLASE" in the last 168 hours. No results for input(s): "AMMONIA" in the last 168 hours. CBC: Recent Labs  Lab 08/01/22 1614 08/02/22 0337 08/03/22 0127 08/04/22 0244 08/05/22 0207  WBC 5.0 6.4 5.1 5.1 5.7  NEUTROABS  --   --  3.1  --   --   HGB 9.0* 9.2* 8.6* 9.0* 9.4*  HCT 27.2* 28.0* 25.0* 25.9* 27.1*  MCV 96.1 97.2 93.6 93.2 93.4  PLT 101* 105* 103* 103* 107*   Cardiac Enzymes: No results for input(s): "CKTOTAL", "CKMB", "CKMBINDEX", "TROPONINI" in the last 168 hours. BNP: BNP (last 3 results) Recent Labs    08/01/22 1614  BNP 727.4*    ProBNP (last 3 results) No results for input(s): "PROBNP" in the last 8760 hours.  CBG: No results for input(s): "GLUCAP" in the last 168 hours.     Signed:  Fayrene Helper MD.  [facility] 08/05/2022, 10:51 AM

## 2022-08-05 NOTE — Progress Notes (Signed)
Discharge instructions given. Patient verbalized understanding and all questions were answered.  ?

## 2022-08-05 NOTE — Progress Notes (Addendum)
Rounding Note    Patient Name: [name] Date of Encounter: 08/05/2022  [facility] Cardiologist: Pixie Casino, MD   Subjective   Sleeping comfortably, easily woken, no complaints, daughter is bedside  Inpatient Medications    Scheduled Meds:  amLODipine  10 mg Oral QHS   aspirin EC  81 mg Oral QHS   Chlorhexidine Gluconate Cloth  6 each Topical Q0600   furosemide  40 mg Oral Daily   isosorbide mononitrate  30 mg Oral TID   mupirocin ointment  1 Application Nasal BID   niacin  500 mg Oral QHS   ranolazine  500 mg Oral Daily   rosuvastatin  40 mg Oral Daily   sodium chloride flush  3 mL Intravenous Q12H   Continuous Infusions:   ceFAZolin (ANCEF) IV 1 g (08/05/22 0821)   PRN Meds:    Vital Signs    Vitals:   08/04/22 2046 08/05/22 0108 08/05/22 0440 08/05/22 0745  BP: (!) 153/75 135/69 (!) 142/73 138/73  Pulse: 76 79 77 81  Resp: '16 16 16 17  '$ Temp: (!) 97.4 F (36.3 C) 97.6 F (36.4 C) 97.9 F (36.6 C) 97.6 F (36.4 C)  TempSrc: Oral Oral Oral Oral  SpO2: 99% 99% 97% 98%  Weight:   73.9 kg   Height:        Intake/Output Summary (Last 24 hours) at 08/05/2022 0835 Last data filed at 08/05/2022 0717 Gross per 24 hour  Intake 493.69 ml  Output 2050 ml  Net -1556.31 ml      08/05/2022    4:40 AM 08/04/2022    2:55 AM 08/03/2022    4:24 AM  Last 3 Weights  Weight (lbs) 163 lb 168 lb 11.2 oz 169 lb 15.6 oz  Weight (kg) 73.936 kg 76.522 kg 77.1 kg      Telemetry    SR, V pacing, occasional PVCs- Personally Reviewed  ECG    SR, V pacing 82bpm, PVCs - Personally Reviewed  Physical Exam   GEN: No acute distress.   Neck: No JVD Cardiac: RRR, 2/6 SM, rubs, or gallops.  Respiratory: Clear to auscultation bilaterally. GI: Soft, nontender, non-distended  MS: No edema; No deformity. Neuro:  Nonfocal  Psych: Normal affect   PPM site is stable, a very slight oozing, no hematoma  Labs    High Sensitivity Troponin:   Recent  Labs  Lab 08/01/22 1542 08/01/22 1614  TROPONINIHS 56* 58*     Chemistry Recent Labs  Lab 08/03/22 0127 08/04/22 0244 08/05/22 0207  NA 138 137 138  K 4.0 3.7 4.0  CL 111 110 110  CO2 19* 20* 19*  GLUCOSE 94 100* 99  BUN 35* 35* 29*  CREATININE 2.82* 2.65* 2.68*  CALCIUM 8.4* 8.4* 8.9  MG 2.2 2.2  --   PROT 5.6*  --   --   ALBUMIN 3.2*  --   --   AST 20  --   --   ALT 25  --   --   ALKPHOS 94  --   --   BILITOT 0.7  --   --   GFRNONAA 21* 23* 22*  ANIONGAP '8 7 9    '$ Lipids No results for input(s): "CHOL", "TRIG", "HDL", "LABVLDL", "LDLCALC", "CHOLHDL" in the last 168 hours.  Hematology Recent Labs  Lab 08/03/22 0127 08/04/22 0244 08/05/22 0207  WBC 5.1 5.1 5.7  RBC 2.67* 2.78* 2.90*  HGB 8.6* 9.0* 9.4*  HCT 25.0* 25.9* 27.1*  MCV 93.6 93.2 93.4  MCH 32.2 32.4 32.4  MCHC 34.4 34.7 34.7  RDW 16.2* 15.9* 15.7*  PLT 103* 103* 107*   Thyroid No results for input(s): "TSH", "FREET4" in the last 168 hours.  BNP Recent Labs  Lab 08/01/22 1614  BNP 727.4*    DDimer No results for input(s): "DDIMER" in the last 168 hours.   Radiology      Cardiac Studies    08/02/22: TTE 1. Left ventricular ejection fraction, by estimation, is 60 to 65%. The  left ventricle has normal function. The left ventricle has no regional  wall motion abnormalities. Left ventricular diastolic parameters are  consistent with Grade II diastolic  dysfunction (pseudonormalization).   2. Right ventricular systolic function is mildly reduced. The right  ventricular size is moderately enlarged. Pulmonary artery systolic  pressures cannot be calculated due to rapid pressure equilibration between  right atrium and right ventricle from  severe TR.   3. Right atrial size was severely dilated.   4. The mitral valve is grossly normal. Mild mitral valve regurgitation.  No evidence of mitral stenosis.   5. The tricuspid valve is abnormal. Tricuspid valve regurgitation is  severe.   6. The  aortic valve has been repaired/replaced. Aortic valve  regurgitation is not visualized. No aortic stenosis is present. There is a  26 mm Sapien prosthetic (TAVR) valve present in the aortic position.  Procedure Date: 10/31/2020. Echo findings are  consistent with normal structure and function of the aortic valve  prosthesis.   7. Systolic flow reversal in hepatic veins consistent with severe  tricuspid regurgitation. The inferior vena cava is dilated in size with  <50% respiratory variability, suggesting right atrial pressure of 15 mmHg.   Comparison(s): Changes from prior study are noted. TR is severe.   Patient Profile     88 y.o. male w/PMHx of CAD (CABG 1996, PCI's to RCA, last 2013), PVD (b/l CEA 2008), stroke,  HTN, HLDM LBBB, CKD (III, follows with [facility]), VHD (s/p TAVR 2018)   Last saw gen cardiology team 07/22/22, his wife recently passed, noting an up-tick in his fatigue, generalized weakness, EKG noted 2:1 AVBlock with a RBBB with no reports of syncope/overt symptoms, and stable BP, his lopressor stopped  Admitted 08/01/22 with further reduction in exertional capacity, weakness, found in CHB, stable BP, no reversible causes, no suspicion of ACS  Assessment & Plan   CHB LBBB at baseline > RBBB escape in CHB  S/p PPM yesterday Site with a slight oozing, pressure held and pressure dressing placed Device check this AM with good measurements/stable findings CXR this AM with stable lead position, no PTX  EP follow up is in place Wound care and activity restrictions reviewed with the patient Dr. Lovena Le has seen and examined the patient this AM OK to discharge from EP perspective when ready medically otherwise    CAD No anginal symptoms No suspicion of ACS with low flat HS Trop in setting of AKI/CKD Resume Plavix MONDAY, 08/11/22 Start lopressor '25mg'$  BID today  PVD Resume Plavix on MONDAY 08/11/22  VHD S/p TAVR 2018 Functioning well  Severe TR on echo  this admission RV   AKI on CKD About the same today Uncertain where is baseline is, labs done via [facility] it seems, and not in Epic C/w IM team   WE will sign off though remain available, please recall if needed   For questions or updates, please contact [place] Please consult [email or web address] for  contact info under     Signed, Baldwin Jamaica, PA-C  08/05/2022, 8:35 AM    EP Attending  Patient seen and examined. Agree with above. The patient is doing well and is s/p PPM insertion. He can be discharged home. Usual followup. Interrogation of his DDD PM under my direction demonstrates normal device function.  Carleene Overlie Abu [place],MD

## 2022-08-06 ENCOUNTER — Ambulatory Visit: Payer: Medicare PPO | Attending: Student | Admitting: Student

## 2022-08-06 DIAGNOSIS — R001 Bradycardia, unspecified: Secondary | ICD-10-CM

## 2022-08-06 NOTE — Progress Notes (Signed)
Pt seen for pressure bandage removal.   Some serosanguinous drainage noted with small clot adherent to medial upper corner of steri strips. Clot removed with gauze.   Replaced bulky gauze and 3 strips of tensoplast.    Pt daughter will remove tensoplast Friday am. If has had continued considerable drainage, will come to office to re-apply a larger pressure dressing.   Otherwise, will continue to hold plavix as instructed.   I have asked him to further delay plavix resumption to Tuesday, and possibly longer pending continued drainage.   [name] [address]" [place], [place]  08/06/2022 12:39 PM

## 2022-08-08 ENCOUNTER — Ambulatory Visit: Payer: Medicare PPO | Attending: Student | Admitting: Student

## 2022-08-08 DIAGNOSIS — R001 Bradycardia, unspecified: Secondary | ICD-10-CM

## 2022-08-08 NOTE — Progress Notes (Signed)
Pt called back to be seen with continued bleeding through pressure dressing.   Seen in clinic and this was removed, showing large, flat adherent clot to back of steri-strips, which also came off when removing the pressure bandage.   As below (picture taken after benzoin applied). Would overall well healed with small lip in the middle, where pt has had a very slow ooze. Suspect adherent clot was preventing wound closure.   Discussed with Dr. Lovena Le. Replaced steri-strips but no further pressure dressing applied.  Continue to hold Plavix through 12/29, next Friday.   Pt to call back next week if they continue to have issues.   Legrand Como [address] [place], PA-C  08/08/2022 12:10 PM

## 2022-08-19 ENCOUNTER — Ambulatory Visit (HOSPITAL_COMMUNITY): Payer: Medicare PPO

## 2022-08-21 ENCOUNTER — Ambulatory Visit: Payer: Medicare PPO | Attending: Internal Medicine

## 2022-08-21 DIAGNOSIS — I447 Left bundle-branch block, unspecified: Secondary | ICD-10-CM | POA: Diagnosis not present

## 2022-08-21 NOTE — Patient Instructions (Addendum)
   After Your Pacemaker   Monitor your pacemaker site for redness, swelling, and drainage. Call the device clinic at [phone] if you experience these symptoms or fever/chills.  Your incision was closed with Steri-strips or staples:  You may shower 7 days after your procedure and wash your incision with soap and water. Avoid lotions, ointments, or perfumes over your incision until it is well-healed.  You have a small hematoma formed under your incision which can normally occur.  Monitor for any signs of infection ie: warmth, redness, drainage, fever/chills. Also, if soft bulge gets bigger or feels harder at wound site, notify our office immediately.     Continue to monitor the skin tear on your left arm for infection.  VERY IMPORTANT: if area appears to develop any of the above mentioned signs of infection, go to Urgent care or Primary doctor immediately for evaluation for cellulitis and call our device clinic at above number.  Infection in this area puts your device area in extreme risk for cross infection.    You may use a hot tub or a pool after your wound check appointment if the incision is completely closed.  Do not lift, push or pull greater than 10 pounds with the affected arm until 6 weeks after your procedure. There are no other restrictions in arm movement after your wound check appointment. Until after January 29th.   You may drive, unless driving has been restricted by your healthcare providers.   Remote monitoring is used to monitor your pacemaker from home. This monitoring is scheduled every 91 days by our office. It allows [place] to keep an eye on the functioning of your device to ensure it is working properly. You will routinely see your Electrophysiologist annually (more often if necessary).

## 2022-08-27 LAB — CUP PACEART INCLINIC DEVICE CHECK
Battery Remaining Longevity: 142 mo
Battery Voltage: 3.21 V
Brady Statistic AP VP Percent: 18.43 %
Brady Statistic AP VS Percent: 0.08 %
Brady Statistic AS VP Percent: 76.53 %
Brady Statistic AS VS Percent: 4.95 %
Brady Statistic RA Percent Paced: 20.89 %
Brady Statistic RV Percent Paced: 94.97 %
Date Time Interrogation Session: 20240104151300
Implantable Lead Connection Status: 753985
Implantable Lead Connection Status: 753985
Implantable Lead Implant Date: 20231218
Implantable Lead Implant Date: 20231218
Implantable Lead Location: 753859
Implantable Lead Location: 753860
Implantable Lead Model: 3830
Implantable Lead Model: 5076
Implantable Pulse Generator Implant Date: 20231218
Lead Channel Impedance Value: 342 Ohm
Lead Channel Impedance Value: 418 Ohm
Lead Channel Impedance Value: 494 Ohm
Lead Channel Impedance Value: 589 Ohm
Lead Channel Pacing Threshold Amplitude: 0.625 V
Lead Channel Pacing Threshold Amplitude: 0.75 V
Lead Channel Pacing Threshold Pulse Width: 0.4 ms
Lead Channel Pacing Threshold Pulse Width: 0.4 ms
Lead Channel Sensing Intrinsic Amplitude: 2.75 mV
Lead Channel Sensing Intrinsic Amplitude: 21.625 mV
Lead Channel Sensing Intrinsic Amplitude: 3.375 mV
Lead Channel Setting Pacing Amplitude: 3.5 V
Lead Channel Setting Pacing Amplitude: 3.5 V
Lead Channel Setting Pacing Pulse Width: 0.4 ms
Lead Channel Setting Sensing Sensitivity: 0.9 mV
Zone Setting Status: 755011

## 2022-08-27 NOTE — Progress Notes (Signed)
Wound check appointment. Steri-strips removed. Wound without redness. Incision edges approximated, wound well healed. There is a small hematoma at wound site; however, minimal and is healing.  Patient given monitoring instructions if should worsen (larger/harder) or any s/s of infection, he is to call our office immediately.  Currently no signs of infection and is healing.  Patient verbalizes will continue to monitor and notify us if any concerns.  Device clinic number given in AVS  instructions.    Normal device function. Thresholds, sensing, and impedances consistent with implant measurements. Device programmed at 3.5V/auto capture programmed on for extra safety margin until 3 month visit. Histogram distribution appropriate for patient and level of activity. No mode switches or high ventricular rates noted. Patient educated about wound care, arm mobility, lifting restrictions. ROV in 3 months with implanting physician.

## 2022-08-31 ENCOUNTER — Other Ambulatory Visit: Payer: Self-pay | Admitting: Internal Medicine

## 2022-09-07 ENCOUNTER — Other Ambulatory Visit: Payer: Self-pay | Admitting: Internal Medicine

## 2022-09-08 ENCOUNTER — Emergency Department (HOSPITAL_COMMUNITY): Payer: Medicare PPO

## 2022-09-08 ENCOUNTER — Inpatient Hospital Stay (HOSPITAL_COMMUNITY)
Admission: EM | Admit: 2022-09-08 | Discharge: 2022-09-18 | DRG: 964 | Disposition: E | Payer: Medicare PPO | Attending: Surgery | Admitting: Surgery

## 2022-09-08 DIAGNOSIS — Z515 Encounter for palliative care: Secondary | ICD-10-CM

## 2022-09-08 DIAGNOSIS — Z888 Allergy status to other drugs, medicaments and biological substances status: Secondary | ICD-10-CM

## 2022-09-08 DIAGNOSIS — S065XAA Traumatic subdural hemorrhage with loss of consciousness status unknown, initial encounter: Secondary | ICD-10-CM | POA: Diagnosis present

## 2022-09-08 DIAGNOSIS — F19939 Other psychoactive substance use, unspecified with withdrawal, unspecified: Secondary | ICD-10-CM | POA: Diagnosis not present

## 2022-09-08 DIAGNOSIS — M79662 Pain in left lower leg: Secondary | ICD-10-CM | POA: Diagnosis present

## 2022-09-08 DIAGNOSIS — S270XXA Traumatic pneumothorax, initial encounter: Secondary | ICD-10-CM | POA: Diagnosis present

## 2022-09-08 DIAGNOSIS — M25561 Pain in right knee: Secondary | ICD-10-CM | POA: Diagnosis present

## 2022-09-08 DIAGNOSIS — S066X0A Traumatic subarachnoid hemorrhage without loss of consciousness, initial encounter: Secondary | ICD-10-CM | POA: Diagnosis not present

## 2022-09-08 DIAGNOSIS — R000 Tachycardia, unspecified: Secondary | ICD-10-CM | POA: Diagnosis present

## 2022-09-08 DIAGNOSIS — S27321A Contusion of lung, unilateral, initial encounter: Secondary | ICD-10-CM | POA: Diagnosis present

## 2022-09-08 DIAGNOSIS — S36021A Major contusion of spleen, initial encounter: Secondary | ICD-10-CM | POA: Diagnosis not present

## 2022-09-08 DIAGNOSIS — J939 Pneumothorax, unspecified: Secondary | ICD-10-CM | POA: Diagnosis not present

## 2022-09-08 DIAGNOSIS — I959 Hypotension, unspecified: Secondary | ICD-10-CM | POA: Diagnosis present

## 2022-09-08 DIAGNOSIS — Z23 Encounter for immunization: Secondary | ICD-10-CM

## 2022-09-08 DIAGNOSIS — S2242XA Multiple fractures of ribs, left side, initial encounter for closed fracture: Secondary | ICD-10-CM | POA: Diagnosis not present

## 2022-09-08 DIAGNOSIS — R402432 Glasgow coma scale score 3-8, at arrival to emergency department: Secondary | ICD-10-CM | POA: Diagnosis present

## 2022-09-08 DIAGNOSIS — M25571 Pain in right ankle and joints of right foot: Secondary | ICD-10-CM | POA: Diagnosis present

## 2022-09-08 DIAGNOSIS — R9431 Abnormal electrocardiogram [ECG] [EKG]: Secondary | ICD-10-CM | POA: Diagnosis not present

## 2022-09-08 DIAGNOSIS — M25522 Pain in left elbow: Secondary | ICD-10-CM | POA: Diagnosis present

## 2022-09-08 DIAGNOSIS — S36039A Unspecified laceration of spleen, initial encounter: Secondary | ICD-10-CM | POA: Diagnosis present

## 2022-09-08 DIAGNOSIS — Z952 Presence of prosthetic heart valve: Secondary | ICD-10-CM

## 2022-09-08 DIAGNOSIS — S199XXA Unspecified injury of neck, initial encounter: Secondary | ICD-10-CM | POA: Diagnosis not present

## 2022-09-08 DIAGNOSIS — I6529 Occlusion and stenosis of unspecified carotid artery: Secondary | ICD-10-CM | POA: Diagnosis present

## 2022-09-08 DIAGNOSIS — S0634AA Traumatic hemorrhage of right cerebrum with loss of consciousness status unknown, initial encounter: Secondary | ICD-10-CM | POA: Diagnosis not present

## 2022-09-08 DIAGNOSIS — S2231XA Fracture of one rib, right side, initial encounter for closed fracture: Secondary | ICD-10-CM | POA: Diagnosis not present

## 2022-09-08 DIAGNOSIS — I2510 Atherosclerotic heart disease of native coronary artery without angina pectoris: Secondary | ICD-10-CM | POA: Diagnosis present

## 2022-09-08 DIAGNOSIS — D62 Acute posthemorrhagic anemia: Secondary | ICD-10-CM | POA: Diagnosis present

## 2022-09-08 DIAGNOSIS — R0681 Apnea, not elsewhere classified: Secondary | ICD-10-CM | POA: Diagnosis not present

## 2022-09-08 DIAGNOSIS — S51012A Laceration without foreign body of left elbow, initial encounter: Secondary | ICD-10-CM | POA: Diagnosis not present

## 2022-09-08 DIAGNOSIS — M4856XS Collapsed vertebra, not elsewhere classified, lumbar region, sequela of fracture: Secondary | ICD-10-CM | POA: Diagnosis present

## 2022-09-08 DIAGNOSIS — E782 Mixed hyperlipidemia: Secondary | ICD-10-CM | POA: Diagnosis present

## 2022-09-08 DIAGNOSIS — M79605 Pain in left leg: Secondary | ICD-10-CM | POA: Diagnosis not present

## 2022-09-08 DIAGNOSIS — S065X0A Traumatic subdural hemorrhage without loss of consciousness, initial encounter: Secondary | ICD-10-CM | POA: Diagnosis not present

## 2022-09-08 DIAGNOSIS — S0636AA Traumatic hemorrhage of cerebrum, unspecified, with loss of consciousness status unknown, initial encounter: Secondary | ICD-10-CM | POA: Diagnosis not present

## 2022-09-08 DIAGNOSIS — Z951 Presence of aortocoronary bypass graft: Secondary | ICD-10-CM

## 2022-09-08 DIAGNOSIS — S2243XA Multiple fractures of ribs, bilateral, initial encounter for closed fracture: Secondary | ICD-10-CM | POA: Diagnosis not present

## 2022-09-08 DIAGNOSIS — S066XAA Traumatic subarachnoid hemorrhage with loss of consciousness status unknown, initial encounter: Secondary | ICD-10-CM | POA: Diagnosis present

## 2022-09-08 DIAGNOSIS — I129 Hypertensive chronic kidney disease with stage 1 through stage 4 chronic kidney disease, or unspecified chronic kidney disease: Secondary | ICD-10-CM | POA: Diagnosis present

## 2022-09-08 DIAGNOSIS — Z8673 Personal history of transient ischemic attack (TIA), and cerebral infarction without residual deficits: Secondary | ICD-10-CM

## 2022-09-08 DIAGNOSIS — S36029A Unspecified contusion of spleen, initial encounter: Secondary | ICD-10-CM | POA: Diagnosis not present

## 2022-09-08 DIAGNOSIS — Z7902 Long term (current) use of antithrombotics/antiplatelets: Secondary | ICD-10-CM

## 2022-09-08 DIAGNOSIS — T1490XA Injury, unspecified, initial encounter: Secondary | ICD-10-CM | POA: Diagnosis not present

## 2022-09-08 DIAGNOSIS — Z950 Presence of cardiac pacemaker: Secondary | ICD-10-CM

## 2022-09-08 DIAGNOSIS — J982 Interstitial emphysema: Secondary | ICD-10-CM | POA: Diagnosis present

## 2022-09-08 DIAGNOSIS — Z66 Do not resuscitate: Secondary | ICD-10-CM | POA: Diagnosis not present

## 2022-09-08 DIAGNOSIS — Y92410 Unspecified street and highway as the place of occurrence of the external cause: Secondary | ICD-10-CM | POA: Diagnosis not present

## 2022-09-08 DIAGNOSIS — I442 Atrioventricular block, complete: Secondary | ICD-10-CM | POA: Diagnosis present

## 2022-09-08 DIAGNOSIS — Z79899 Other long term (current) drug therapy: Secondary | ICD-10-CM

## 2022-09-08 DIAGNOSIS — I619 Nontraumatic intracerebral hemorrhage, unspecified: Secondary | ICD-10-CM | POA: Diagnosis present

## 2022-09-08 DIAGNOSIS — S0003XA Contusion of scalp, initial encounter: Secondary | ICD-10-CM | POA: Diagnosis not present

## 2022-09-08 DIAGNOSIS — N183 Chronic kidney disease, stage 3 unspecified: Secondary | ICD-10-CM | POA: Diagnosis present

## 2022-09-08 DIAGNOSIS — S20219A Contusion of unspecified front wall of thorax, initial encounter: Secondary | ICD-10-CM | POA: Diagnosis present

## 2022-09-08 LAB — CBC
HCT: 20.2 % — ABNORMAL LOW (ref 39.0–52.0)
HCT: 25.4 % — ABNORMAL LOW (ref 39.0–52.0)
HCT: 25 % — ABNORMAL LOW (ref 39.0–52.0)
Hemoglobin: 6.7 g/dL — CL (ref 13.0–17.0)
Hemoglobin: 8.6 g/dL — ABNORMAL LOW (ref 13.0–17.0)
Hemoglobin: 8.5 g/dL — ABNORMAL LOW (ref 13.0–17.0)
MCH: 32.1 pg (ref 26.0–34.0)
MCH: 31.4 pg (ref 26.0–34.0)
MCH: 31.7 pg (ref 26.0–34.0)
MCHC: 33.2 g/dL (ref 30.0–36.0)
MCHC: 33.9 g/dL (ref 30.0–36.0)
MCHC: 34 g/dL (ref 30.0–36.0)
MCV: 96.7 fL (ref 80.0–100.0)
MCV: 92.7 fL (ref 80.0–100.0)
MCV: 93.3 fL (ref 80.0–100.0)
Platelets: 114 10*3/uL — ABNORMAL LOW (ref 150–400)
Platelets: 125 10*3/uL — ABNORMAL LOW (ref 150–400)
Platelets: 116 10*3/uL — ABNORMAL LOW (ref 150–400)
RBC: 2.09 MIL/uL — ABNORMAL LOW (ref 4.22–5.81)
RBC: 2.74 MIL/uL — ABNORMAL LOW (ref 4.22–5.81)
RBC: 2.68 MIL/uL — ABNORMAL LOW (ref 4.22–5.81)
RDW: 14.2 % (ref 11.5–15.5)
RDW: 14.6 % (ref 11.5–15.5)
RDW: 14.8 % (ref 11.5–15.5)
WBC: 6 10*3/uL (ref 4.0–10.5)
WBC: 11.4 10*3/uL — ABNORMAL HIGH (ref 4.0–10.5)
WBC: 15.8 10*3/uL — ABNORMAL HIGH (ref 4.0–10.5)
nRBC: 0 % (ref 0.0–0.2)
nRBC: 0 % (ref 0.0–0.2)
nRBC: 0 % (ref 0.0–0.2)

## 2022-09-08 LAB — I-STAT CHEM 8, ED
BUN: 24 mg/dL — ABNORMAL HIGH (ref 8–23)
Calcium, Ion: 0.94 mmol/L — ABNORMAL LOW (ref 1.15–1.40)
Chloride: 116 mmol/L — ABNORMAL HIGH (ref 98–111)
Creatinine, Ser: 1.8 mg/dL — ABNORMAL HIGH (ref 0.61–1.24)
Glucose, Bld: 111 mg/dL — ABNORMAL HIGH (ref 70–99)
HCT: 17 % — ABNORMAL LOW (ref 39.0–52.0)
Hemoglobin: 5.8 g/dL — CL (ref 13.0–17.0)
Potassium: 3.2 mmol/L — ABNORMAL LOW (ref 3.5–5.1)
Sodium: 143 mmol/L (ref 135–145)
TCO2: 14 mmol/L — ABNORMAL LOW (ref 22–32)

## 2022-09-08 LAB — COMPREHENSIVE METABOLIC PANEL
ALT: 20 U/L (ref 0–44)
AST: 31 U/L (ref 15–41)
Albumin: 2.4 g/dL — ABNORMAL LOW (ref 3.5–5.0)
Alkaline Phosphatase: 58 U/L (ref 38–126)
Anion gap: 4 — ABNORMAL LOW (ref 5–15)
BUN: 25 mg/dL — ABNORMAL HIGH (ref 8–23)
CO2: 15 mmol/L — ABNORMAL LOW (ref 22–32)
Calcium: 6 mg/dL — CL (ref 8.9–10.3)
Chloride: 119 mmol/L — ABNORMAL HIGH (ref 98–111)
Creatinine, Ser: 1.63 mg/dL — ABNORMAL HIGH (ref 0.61–1.24)
GFR, Estimated: 41 mL/min — ABNORMAL LOW (ref >60)
Glucose, Bld: 113 mg/dL — ABNORMAL HIGH (ref 70–99)
Potassium: 3.1 mmol/L — ABNORMAL LOW (ref 3.5–5.1)
Sodium: 138 mmol/L (ref 135–145)
Total Bilirubin: 0.5 mg/dL (ref 0.3–1.2)
Total Protein: 4.4 g/dL — ABNORMAL LOW (ref 6.5–8.1)

## 2022-09-08 LAB — PROTIME-INR
INR: 1.4 — ABNORMAL HIGH (ref 0.8–1.2)
Prothrombin Time: 17.2 seconds — ABNORMAL HIGH (ref 11.4–15.2)

## 2022-09-08 LAB — ABO/RH: ABO/RH(D): O POS

## 2022-09-08 LAB — ETHANOL: Alcohol, Ethyl (B): <10 mg/dL (ref <10)

## 2022-09-08 LAB — LACTIC ACID, PLASMA: Lactic Acid, Venous: 1.7 mmol/L (ref 0.5–1.9)

## 2022-09-08 MED ORDER — FENTANYL CITRATE (PF) 100 MCG/2ML IJ SOLN
INTRAMUSCULAR | Status: AC
  Filled 2022-09-08: qty 2

## 2022-09-08 MED ORDER — POTASSIUM CHLORIDE 10 MEQ/100ML IV SOLN
10.0000 meq | INTRAVENOUS | Status: DC
  Administered 2022-09-08: 10 meq via INTRAVENOUS
  Filled 2022-09-08 (×6): qty 100

## 2022-09-08 MED ORDER — SODIUM CHLORIDE 0.9% IV SOLUTION: Freq: Once | INTRAVENOUS | Status: DC

## 2022-09-08 MED ORDER — ACETAMINOPHEN 10 MG/ML IV SOLN
1000.0000 mg | Freq: Four times a day (QID) | INTRAVENOUS | Status: AC
  Administered 2022-09-08 – 2022-09-09 (×4): 1000 mg via INTRAVENOUS
  Filled 2022-09-08 (×6): qty 100

## 2022-09-08 MED ORDER — ONDANSETRON 4 MG PO TBDP: 4.0000 mg | ORAL_TABLET | Freq: Four times a day (QID) | ORAL | Status: DC | PRN

## 2022-09-08 MED ORDER — POTASSIUM CHLORIDE 10 MEQ/100ML IV SOLN
10.0000 meq | INTRAVENOUS | Status: AC
  Administered 2022-09-08 (×3): 10 meq via INTRAVENOUS

## 2022-09-08 MED ORDER — IOHEXOL 350 MG/ML SOLN
75.0000 mL | Freq: Once | INTRAVENOUS | Status: AC | PRN
  Administered 2022-09-08: 75 mL via INTRAVENOUS

## 2022-09-08 MED ORDER — ACETAMINOPHEN 500 MG PO TABS: 1000.0000 mg | ORAL_TABLET | Freq: Four times a day (QID) | ORAL | Status: DC

## 2022-09-08 MED ORDER — ONDANSETRON HCL 4 MG/2ML IJ SOLN
4.0000 mg | Freq: Four times a day (QID) | INTRAMUSCULAR | Status: DC | PRN
  Administered 2022-09-09: 4 mg via INTRAVENOUS
  Filled 2022-09-08: qty 2

## 2022-09-08 MED ORDER — SODIUM CHLORIDE 0.9 % IV SOLN
INTRAVENOUS | Status: AC | PRN
  Administered 2022-09-08: 1000 mL via INTRAVENOUS

## 2022-09-08 MED ORDER — LEVETIRACETAM IN NACL 500 MG/100ML IV SOLN
500.0000 mg | Freq: Two times a day (BID) | INTRAVENOUS | Status: DC
  Administered 2022-09-09 – 2022-09-10 (×3): 500 mg via INTRAVENOUS
  Filled 2022-09-08 (×3): qty 100

## 2022-09-08 MED ORDER — ROCURONIUM BROMIDE 10 MG/ML (PF) SYRINGE
PREFILLED_SYRINGE | INTRAVENOUS | Status: AC
  Filled 2022-09-08: qty 10

## 2022-09-08 MED ORDER — LEVETIRACETAM IN NACL 1000 MG/100ML IV SOLN
1000.0000 mg | Freq: Once | INTRAVENOUS | Status: AC
  Administered 2022-09-08: 1000 mg via INTRAVENOUS
  Filled 2022-09-08: qty 100

## 2022-09-08 MED ORDER — OXYCODONE HCL 5 MG PO TABS: 2.5000 mg | ORAL_TABLET | ORAL | Status: DC | PRN

## 2022-09-08 MED ORDER — SODIUM CHLORIDE 0.9 % IV SOLN: INTRAVENOUS | Status: DC

## 2022-09-08 MED ORDER — METHOCARBAMOL 500 MG PO TABS: 1000.0000 mg | ORAL_TABLET | Freq: Three times a day (TID) | ORAL | Status: DC

## 2022-09-08 MED ORDER — TETANUS-DIPHTH-ACELL PERTUSSIS 5-2.5-18.5 LF-MCG/0.5 IM SUSY
0.5000 mL | PREFILLED_SYRINGE | Freq: Once | INTRAMUSCULAR | Status: AC
  Administered 2022-09-08: 0.5 mL via INTRAMUSCULAR

## 2022-09-08 MED ORDER — CEFAZOLIN SODIUM-DEXTROSE 2-4 GM/100ML-% IV SOLN
2.0000 g | Freq: Once | INTRAVENOUS | Status: AC
  Administered 2022-09-08: 2 g via INTRAVENOUS

## 2022-09-08 MED ORDER — FENTANYL CITRATE PF 50 MCG/ML IJ SOSY
PREFILLED_SYRINGE | INTRAMUSCULAR | Status: AC | PRN
  Administered 2022-09-08: 12.5 ug via INTRAVENOUS

## 2022-09-08 MED ORDER — DOCUSATE SODIUM 100 MG PO CAPS: 100.0000 mg | ORAL_CAPSULE | Freq: Two times a day (BID) | ORAL | Status: DC

## 2022-09-08 MED ORDER — MORPHINE SULFATE (PF) 2 MG/ML IV SOLN
1.0000 mg | INTRAVENOUS | Status: DC | PRN
  Administered 2022-09-08: 2 mg via INTRAVENOUS
  Administered 2022-09-09: 1 mg via INTRAVENOUS
  Administered 2022-09-09 – 2022-09-10 (×4): 2 mg via INTRAVENOUS
  Filled 2022-09-08 (×6): qty 1

## 2022-09-08 MED ORDER — FENTANYL 2500MCG IN NS 250ML (10MCG/ML) PREMIX INFUSION
25.0000 ug/h | INTRAVENOUS | Status: DC
  Filled 2022-09-08: qty 250

## 2022-09-08 MED ORDER — ETOMIDATE 2 MG/ML IV SOLN
INTRAVENOUS | Status: AC
  Filled 2022-09-08: qty 10
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
## 2022-09-08 NOTE — ED Notes (Signed)
Pts pastor at bedside, family en route
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
## 2022-09-08 NOTE — ED Notes (Signed)
Patient restless, occasionally moaning out.  Patient medicated per MAR.

## 2022-09-08 NOTE — ED Notes (Addendum)
Previous note made in error, redacted.

## 2022-09-08 NOTE — ED Notes (Signed)
Delay for CT, chest tube insertion

## 2022-09-08 NOTE — Progress Notes (Addendum)
Clinical update provided to patient's daughter, son, and grandson in person and daughter-in-law via phone. Discussed code status and indications for intubation, which they decline. They also decline chest compressions, defibrillation/cardioversion, and anti-arrhythmics. They are agreeable for now to vasopressors. They seem to understand the gravity of the situation and I have explained that I suspect this to be a terminal event. For now, they desire maximal medical intervention up to the point of intubation/CPR, but we discussed re-visiting this depending on his clinical state for a possible transition to comfort care. They are in agreement. Phone numbers for family have been updated in the chart.  Additional critical care time: 66min  Jesusita Oka, MD General and [facility]

## 2022-09-08 NOTE — Progress Notes (Signed)
   09/08/22 1430  Spiritual Encounters  Type of Visit Initial  Care provided to: Pt not available  Referral source Nurse (RN/NT/LPN)  Reason for visit Trauma   Chaplain Jorene Guest responded to the page. The patient is being attended to by the medical team and there is no support person present. Chaplain remains available for follow up support as needed. This note was prepared by Jeanine Luz, M.Div..  For questions please contact by phone [phone].
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
## 2022-09-09 ENCOUNTER — Inpatient Hospital Stay (HOSPITAL_COMMUNITY): Payer: Medicare PPO

## 2022-09-09 LAB — URINALYSIS, ROUTINE W REFLEX MICROSCOPIC
Bacteria, UA: NONE SEEN
Bilirubin Urine: NEGATIVE
Glucose, UA: NEGATIVE mg/dL
Hgb urine dipstick: NEGATIVE
Ketones, ur: NEGATIVE mg/dL
Leukocytes,Ua: NEGATIVE
Nitrite: NEGATIVE
Protein, ur: 30 mg/dL — AB
Specific Gravity, Urine: 1.025 (ref 1.005–1.030)
pH: 5 (ref 5.0–8.0)

## 2022-09-09 LAB — CBC
HCT: 23.5 % — ABNORMAL LOW (ref 39.0–52.0)
Hemoglobin: 8.1 g/dL — ABNORMAL LOW (ref 13.0–17.0)
MCH: 31.9 pg (ref 26.0–34.0)
MCHC: 34.5 g/dL (ref 30.0–36.0)
MCV: 92.5 fL (ref 80.0–100.0)
Platelets: 106 10*3/uL — ABNORMAL LOW (ref 150–400)
RBC: 2.54 MIL/uL — ABNORMAL LOW (ref 4.22–5.81)
RDW: 15.1 % (ref 11.5–15.5)
WBC: 15.9 10*3/uL — ABNORMAL HIGH (ref 4.0–10.5)
nRBC: 0 % (ref 0.0–0.2)

## 2022-09-09 LAB — BLOOD PRODUCT ORDER (VERBAL) VERIFICATION

## 2022-09-09 LAB — TYPE AND SCREEN
ABO/RH(D): O POS
Antibody Screen: NEGATIVE
Unit division: 0

## 2022-09-09 LAB — BPAM RBC
Blood Product Expiration Date: 202402242359
ISSUE DATE / TIME: 202401221508
Unit Type and Rh: 5100

## 2022-09-09 LAB — MRSA NEXT GEN BY PCR, NASAL: MRSA by PCR Next Gen: NOT DETECTED

## 2022-09-09 MED ORDER — CHLORHEXIDINE GLUCONATE CLOTH 2 % EX PADS
6.0000 | MEDICATED_PAD | Freq: Every day | CUTANEOUS | Status: DC
  Administered 2022-09-09: 6 via TOPICAL

## 2022-09-09 MED ORDER — ORAL CARE MOUTH RINSE
15.0000 mL | OROMUCOSAL | Status: DC
  Administered 2022-09-09 – 2022-09-10 (×4): 15 mL via OROMUCOSAL

## 2022-09-09 MED ORDER — ORAL CARE MOUTH RINSE: 15.0000 mL | OROMUCOSAL | Status: DC | PRN

## 2022-09-09 NOTE — Progress Notes (Signed)
PT Cancellation Note  Patient Details Name: [name] MRN: [id number] DOB: [birth year]   Cancelled Treatment:    Reason Eval/Treat Not Completed: Patient not medically ready. Planned palliative meeting today with family due to poor prognosis. Will await for GOC/POC update and return as able/as appropriate to complete PT eval.   Kittie Plater, PT, DPT Acute Rehabilitation Services Secure chat preferred Office #: [phone]    Berline Lopes 09/09/2022, 9:37 AM

## 2022-09-09 NOTE — Progress Notes (Signed)
OT Cancellation Note  Patient Details Name: [name] MRN: [id number] DOB: November 08, 1933   Cancelled Treatment:    Reason Eval/Treat Not Completed: Patient not medically ready.  Pt not medically ready to proceed with OT eval today.  Will check back tomorrow for appropriateness to proceed.   Savannah Erbe OTR/L 09/09/2022, 10:13 AM

## 2022-09-09 NOTE — TOC CAGE-AID Note (Signed)
Transition of Care [name]) - CAGE-AID Screening   Patient Details  Name: [name] MRN: [id number] Date of Birth: Feb 02, 1934  Transition of Care [facility] (2-Rh)) CM/SW Contact:    Army Melia, RN Phone [phone] 09/09/2022, 10:32 PM   Clinical Narrative:  Unable to participate in screening d/t mental status.   CAGE-AID Screening: Substance Abuse Screening unable to be completed due to: : Patient unable to participate
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
## 2022-09-09 NOTE — Progress Notes (Signed)
SLP Cancellation Note  Patient Details Name: OSSIEL MARCHIO MRN: [id number] DOB: 02/02/1934   Cancelled treatment:        Orders for swallowing and cognitive linguistic assessments received and appreciated.  Pt remains unresponsive and is not medically appropriate for PO trials at this time.  Goals of care discussion pending.  SLP will continue to follow at present for medical readiness for evaluation.    Celedonio Savage, [place], [place] Office: [phone] 09/09/2022, 10:32 AM
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
## 2022-09-10 MED ORDER — MORPHINE SULFATE (PF) 2 MG/ML IV SOLN: 1.0000 mg | INTRAVENOUS | Status: DC | PRN

## 2022-09-10 MED ORDER — GLYCOPYRROLATE 0.2 MG/ML IJ SOLN
4.0000 ug/kg | Freq: Three times a day (TID) | INTRAMUSCULAR | Status: DC | PRN
  Administered 2022-09-10: 0.296 mg via INTRAVENOUS
  Filled 2022-09-10: qty 2

## 2022-09-18 NOTE — Progress Notes (Signed)
OT Cancellation Note and Discharge   Patient Details Name: DEUNTA BENEKE MRN: [id number] DOB: 1934-02-04   Cancelled Treatment:    Reason Eval/Treat Not Completed: Other (comment) (Pt is comfort care, OT to sign off at this time)  Jaci Carrel 09/16/22, 9:33 AM  [place] Office: [phone]

## 2022-09-18 NOTE — Progress Notes (Signed)
PT Cancellation Note  Patient Details Name: [name] MRN: [id number] DOB: [birth year]   Cancelled Treatment:    Reason Eval/Treat Not Completed: Other (comment) (move to comfort care, PT signing off.) 2022/10/10  Ginger Carne., PT Acute Rehabilitation Services [phone]  (office)   Tessie Fass Tove Wideman 10/10/22, 9:55 AM

## 2022-09-18 DEATH — deceased

## 2022-10-15 ENCOUNTER — Ambulatory Visit: Payer: Medicare Other | Admitting: Internal Medicine
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
## 2022-11-14 ENCOUNTER — Encounter: Payer: Medicare PPO | Admitting: Internal Medicine
